# Patient Record
Sex: Male | Born: 1935 | Race: Black or African American | Hispanic: No | Smoking: Former smoker
Health system: Southern US, Community
[De-identification: ages and names within clinical notes are randomized; demographics above are authoritative.]

## PROBLEM LIST (undated history)

## (undated) DIAGNOSIS — C61 Malignant neoplasm of prostate: Secondary | ICD-10-CM

## (undated) DIAGNOSIS — J4540 Moderate persistent asthma, uncomplicated: Secondary | ICD-10-CM

## (undated) DIAGNOSIS — M109 Gout, unspecified: Secondary | ICD-10-CM

## (undated) DIAGNOSIS — N183 Chronic kidney disease, stage 3 unspecified: Secondary | ICD-10-CM

## (undated) DIAGNOSIS — Z955 Presence of coronary angioplasty implant and graft: Secondary | ICD-10-CM

## (undated) DIAGNOSIS — Z794 Long term (current) use of insulin: Secondary | ICD-10-CM

## (undated) DIAGNOSIS — R011 Cardiac murmur, unspecified: Secondary | ICD-10-CM

## (undated) DIAGNOSIS — I2510 Atherosclerotic heart disease of native coronary artery without angina pectoris: Secondary | ICD-10-CM

## (undated) DIAGNOSIS — I10 Essential (primary) hypertension: Secondary | ICD-10-CM

## (undated) DIAGNOSIS — G629 Polyneuropathy, unspecified: Secondary | ICD-10-CM

## (undated) DIAGNOSIS — I82409 Acute embolism and thrombosis of unspecified deep veins of unspecified lower extremity: Secondary | ICD-10-CM

## (undated) DIAGNOSIS — E119 Type 2 diabetes mellitus without complications: Secondary | ICD-10-CM

## (undated) DIAGNOSIS — G709 Myoneural disorder, unspecified: Secondary | ICD-10-CM

## (undated) DIAGNOSIS — G4730 Sleep apnea, unspecified: Secondary | ICD-10-CM

## (undated) DIAGNOSIS — IMO0002 Reserved for concepts with insufficient information to code with codable children: Secondary | ICD-10-CM

## (undated) DIAGNOSIS — K635 Polyp of colon: Secondary | ICD-10-CM

## (undated) DIAGNOSIS — R600 Localized edema: Secondary | ICD-10-CM

## (undated) DIAGNOSIS — H269 Unspecified cataract: Secondary | ICD-10-CM

## (undated) DIAGNOSIS — E1122 Type 2 diabetes mellitus with diabetic chronic kidney disease: Secondary | ICD-10-CM

## (undated) DIAGNOSIS — E785 Hyperlipidemia, unspecified: Secondary | ICD-10-CM

## (undated) DIAGNOSIS — D649 Anemia, unspecified: Secondary | ICD-10-CM

## (undated) DIAGNOSIS — E1165 Type 2 diabetes mellitus with hyperglycemia: Secondary | ICD-10-CM

## (undated) DIAGNOSIS — N400 Enlarged prostate without lower urinary tract symptoms: Secondary | ICD-10-CM

## (undated) DIAGNOSIS — G2581 Restless legs syndrome: Secondary | ICD-10-CM

## (undated) DIAGNOSIS — N189 Chronic kidney disease, unspecified: Secondary | ICD-10-CM

## (undated) DIAGNOSIS — J449 Chronic obstructive pulmonary disease, unspecified: Secondary | ICD-10-CM

## (undated) DIAGNOSIS — I739 Peripheral vascular disease, unspecified: Secondary | ICD-10-CM

## (undated) DIAGNOSIS — I219 Acute myocardial infarction, unspecified: Secondary | ICD-10-CM

## (undated) DIAGNOSIS — F524 Premature ejaculation: Secondary | ICD-10-CM

## (undated) DIAGNOSIS — H409 Unspecified glaucoma: Secondary | ICD-10-CM

## (undated) HISTORY — PX: APPENDECTOMY: SHX54

## (undated) HISTORY — PX: PROSTATE SURGERY: SHX751

## (undated) HISTORY — PX: EYE SURGERY: SHX253

## (undated) HISTORY — PX: TONSILLECTOMY: SUR1361

---

## 2007-10-21 HISTORY — PX: BELPHAROPTOSIS REPAIR: SHX369

## 2010-10-20 HISTORY — PX: COLONOSCOPY: SHX174

## 2015-04-12 DIAGNOSIS — Z9842 Cataract extraction status, left eye: Secondary | ICD-10-CM | POA: Insufficient documentation

## 2015-04-12 DIAGNOSIS — H409 Unspecified glaucoma: Secondary | ICD-10-CM | POA: Insufficient documentation

## 2015-06-14 DIAGNOSIS — J4540 Moderate persistent asthma, uncomplicated: Secondary | ICD-10-CM | POA: Insufficient documentation

## 2015-11-13 DIAGNOSIS — Z955 Presence of coronary angioplasty implant and graft: Secondary | ICD-10-CM

## 2015-11-13 HISTORY — DX: Presence of coronary angioplasty implant and graft: Z95.5

## 2016-06-16 DIAGNOSIS — B351 Tinea unguium: Secondary | ICD-10-CM | POA: Insufficient documentation

## 2018-10-03 DIAGNOSIS — N3000 Acute cystitis without hematuria: Secondary | ICD-10-CM | POA: Insufficient documentation

## 2018-10-03 DIAGNOSIS — Z789 Other specified health status: Secondary | ICD-10-CM | POA: Insufficient documentation

## 2018-10-03 DIAGNOSIS — R195 Other fecal abnormalities: Secondary | ICD-10-CM | POA: Insufficient documentation

## 2018-11-17 DIAGNOSIS — Y92009 Unspecified place in unspecified non-institutional (private) residence as the place of occurrence of the external cause: Secondary | ICD-10-CM | POA: Insufficient documentation

## 2018-12-03 ENCOUNTER — Emergency Department: Payer: Medicare Other

## 2018-12-03 ENCOUNTER — Other Ambulatory Visit: Payer: Self-pay

## 2018-12-03 DIAGNOSIS — K5900 Constipation, unspecified: Secondary | ICD-10-CM | POA: Diagnosis present

## 2018-12-03 DIAGNOSIS — E1122 Type 2 diabetes mellitus with diabetic chronic kidney disease: Secondary | ICD-10-CM | POA: Insufficient documentation

## 2018-12-03 DIAGNOSIS — Z87891 Personal history of nicotine dependence: Secondary | ICD-10-CM | POA: Insufficient documentation

## 2018-12-03 DIAGNOSIS — I2510 Atherosclerotic heart disease of native coronary artery without angina pectoris: Secondary | ICD-10-CM | POA: Diagnosis not present

## 2018-12-03 DIAGNOSIS — Z8546 Personal history of malignant neoplasm of prostate: Secondary | ICD-10-CM | POA: Insufficient documentation

## 2018-12-03 DIAGNOSIS — J449 Chronic obstructive pulmonary disease, unspecified: Secondary | ICD-10-CM | POA: Diagnosis not present

## 2018-12-03 DIAGNOSIS — J4540 Moderate persistent asthma, uncomplicated: Secondary | ICD-10-CM | POA: Diagnosis not present

## 2018-12-03 DIAGNOSIS — E119 Type 2 diabetes mellitus without complications: Secondary | ICD-10-CM | POA: Diagnosis not present

## 2018-12-03 DIAGNOSIS — N183 Chronic kidney disease, stage 3 (moderate): Secondary | ICD-10-CM | POA: Diagnosis not present

## 2018-12-03 NOTE — ED Triage Notes (Signed)
Pt states three days since a bowel movement. Pt states has been taking miralax "and I go a little". Pt complains of rectal pain. Pt denies nausea, vomiting, fever, abd pain.

## 2018-12-04 ENCOUNTER — Emergency Department
Admission: EM | Admit: 2018-12-04 | Discharge: 2018-12-04 | Disposition: A | Discharge status: Home / Self Care | Payer: Medicare Other | Attending: Emergency Medicine | Admitting: Emergency Medicine

## 2018-12-04 ENCOUNTER — Encounter: Payer: Self-pay | Admitting: Emergency Medicine

## 2018-12-04 DIAGNOSIS — K5900 Constipation, unspecified: Secondary | ICD-10-CM

## 2018-12-04 HISTORY — DX: Long term (current) use of insulin: Z79.4

## 2018-12-04 HISTORY — DX: Anemia, unspecified: D64.9

## 2018-12-04 HISTORY — DX: Moderate persistent asthma, uncomplicated: J45.40

## 2018-12-04 HISTORY — DX: Polyneuropathy, unspecified: G62.9

## 2018-12-04 HISTORY — DX: Unspecified glaucoma: H40.9

## 2018-12-04 HISTORY — DX: Myoneural disorder, unspecified: G70.9

## 2018-12-04 HISTORY — DX: Unspecified cataract: H26.9

## 2018-12-04 HISTORY — DX: Restless legs syndrome: G25.81

## 2018-12-04 HISTORY — DX: Hyperlipidemia, unspecified: E78.5

## 2018-12-04 HISTORY — DX: Type 2 diabetes mellitus with diabetic chronic kidney disease: E11.65

## 2018-12-04 HISTORY — DX: Chronic kidney disease, unspecified: N18.9

## 2018-12-04 HISTORY — DX: Cardiac murmur, unspecified: R01.1

## 2018-12-04 HISTORY — DX: Localized edema: R60.0

## 2018-12-04 HISTORY — DX: Gout, unspecified: M10.9

## 2018-12-04 HISTORY — DX: Premature ejaculation: F52.4

## 2018-12-04 HISTORY — DX: Benign prostatic hyperplasia without lower urinary tract symptoms: N40.0

## 2018-12-04 HISTORY — DX: Essential (primary) hypertension: I10

## 2018-12-04 HISTORY — DX: Malignant neoplasm of prostate: C61

## 2018-12-04 HISTORY — DX: Presence of coronary angioplasty implant and graft: Z95.5

## 2018-12-04 HISTORY — DX: Sleep apnea, unspecified: G47.30

## 2018-12-04 HISTORY — DX: Peripheral vascular disease, unspecified: I73.9

## 2018-12-04 HISTORY — DX: Chronic kidney disease, stage 3 unspecified: N18.30

## 2018-12-04 HISTORY — DX: Polyp of colon: K63.5

## 2018-12-04 HISTORY — DX: Acute myocardial infarction, unspecified: I21.9

## 2018-12-04 HISTORY — DX: Chronic obstructive pulmonary disease, unspecified: J44.9

## 2018-12-04 HISTORY — DX: Chronic kidney disease, stage 3 (moderate): N18.3

## 2018-12-04 HISTORY — DX: Type 2 diabetes mellitus without complications: E11.9

## 2018-12-04 HISTORY — DX: Acute embolism and thrombosis of unspecified deep veins of unspecified lower extremity: I82.409

## 2018-12-04 HISTORY — DX: Type 2 diabetes mellitus with diabetic chronic kidney disease: E11.22

## 2018-12-04 HISTORY — DX: Atherosclerotic heart disease of native coronary artery without angina pectoris: I25.10

## 2018-12-04 HISTORY — DX: Reserved for concepts with insufficient information to code with codable children: IMO0002

## 2018-12-04 MED ORDER — MAGNESIUM CITRATE PO SOLN
1.0000 | Freq: Once | ORAL | Status: AC
  Administered 2018-12-04: 1 via ORAL
  Filled 2018-12-04: qty 296

## 2018-12-04 MED ORDER — LIDOCAINE HCL URETHRAL/MUCOSAL 2 % EX GEL
1.0000 "application " | Freq: Once | CUTANEOUS | Status: AC
  Administered 2018-12-04: 1 via TOPICAL
  Filled 2018-12-04: qty 10

## 2018-12-04 MED ORDER — DOCUSATE SODIUM 50 MG/5ML PO LIQD
100.0000 mg | ORAL | Status: AC
  Administered 2018-12-04: 100 mg via ORAL
  Filled 2018-12-04: qty 10

## 2018-12-04 NOTE — ED Provider Notes (Signed)
[facility] Emergency Department Provider Note  ____________________________________________   First MD Initiated Contact with Patient 12/04/18 [phone]     (approximate)  I have reviewed the triage vital signs and the nursing notes.   HISTORY  Chief Complaint Constipation    HPI [name] is a 85 y.o. male who presents for evaluation of bloating and some aching lower abdominal pain associated with constipation.  He states he has not had a bowel movement for 3 days although when he takes MiraLAX he "goes a little".  He is he reports his symptoms are severe.  He states that they started after he started taking some antibiotics for a urinary tract infection and his daughter looked at the side effects on the Internet and constipation is no other side effects.  He has no numbness or weakness in his legs or his arms.  He has no back pain.  He denies fever/chills, chest pain, shortness of breath, nausea, and vomiting.  Nothing in particular is making the symptoms better or worse.  Past Medical History:  Diagnosis Date  . Anemia, normocytic normochromic   . BPH (benign prostatic hyperplasia)   . CAD (coronary artery disease)   . Cataracts, bilateral   . Chronic renal insufficiency   . Colon polyp   . COPD (chronic obstructive pulmonary disease) ([place])   . Diabetes mellitus without complication ([place])   . DVT (deep venous thrombosis) ([place])   . Edema of both legs   . Glaucoma   . Gout   . Heart murmur   . Hyperlipidemia   . Hypertension   . Moderate persistent asthma without complication   . Myocardial infarction ([place])   . Neuromuscular disorder ([place])   . Neuropathy   . Peripheral vascular disease ([place])   . Premature ejaculation   . Prostate cancer ([place])   . RLS (restless legs syndrome)   . Sleep apnea   . Stented coronary artery 11/13/2015  . Uncontrolled type 2 diabetes mellitus with stage 3 chronic kidney disease, with long-term current use of  insulin ([place])     There are no active problems to display for this patient.   Past Surgical History:  Procedure Laterality Date  . APPENDECTOMY    . [place]  2009  . COLONOSCOPY  2012  . EYE SURGERY    . PROSTATE SURGERY    . TONSILLECTOMY      Prior to Admission medications   Not on File    Allergies Patient has no known allergies.  History reviewed. No pertinent family history.  Social History Social History   Tobacco Use  . Smoking status: Former Research scientist (life sciences)  . Smokeless tobacco: Never Used  Substance Use Topics  . Alcohol use: Not on file  . Drug use: Not on file    Review of Systems Constitutional: No fever/chills Eyes: No visual changes. ENT: No sore throat. Cardiovascular: Denies chest pain. Respiratory: Denies shortness of breath. Gastrointestinal: Abdominal bloating and aching pain associated with constipation for 3 days as described above. Genitourinary: Negative for dysuria. Musculoskeletal: Negative for neck pain.  Negative for back pain. Integumentary: Negative for rash. Neurological: Negative for headaches, focal weakness or numbness.   ____________________________________________   PHYSICAL EXAM:  VITAL SIGNS: ED Triage Vitals  Enc Vitals Group     BP 12/03/18 2259 (!) 124/99     Pulse Rate 12/03/18 2259 90     Resp 12/03/18 2259 16     Temp 12/03/18 2259 98.9 F (37.2 C)  Temp src --      SpO2 12/03/18 2259 100 %     Weight 12/03/18 2300 123.4 kg (272 lb)     Height 12/03/18 2300 1.803 m (5\' 11" )     Head Circumference --      Peak Flow --      Pain Score 12/03/18 2300 9     Pain Loc --      Pain Edu? --      Excl. in [place]? --     Constitutional: Alert and oriented. Well appearing and in no acute distress. Eyes: Conjunctivae are normal.  Head: Atraumatic. Nose: No congestion/rhinnorhea. Mouth/Throat: Mucous membranes are moist. Neck: No stridor.  No meningeal signs.   Cardiovascular: Normal rate, regular rhythm.  Good peripheral circulation. Grossly normal heart sounds. Respiratory: Normal respiratory effort.  No retractions. Lungs CTAB. Gastrointestinal: Soft and nontender. No distention.  Rectal exam deferred at patient request. Musculoskeletal: No lower extremity tenderness nor edema. No gross deformities of extremities. Neurologic:  Normal speech and language. No gross focal neurologic deficits are appreciated.  Skin:  Skin is warm, dry and intact. No rash noted. Psychiatric: Mood and affect are normal. Speech and behavior are normal.  ____________________________________________   LABS (all labs ordered are listed, but only abnormal results are displayed)  Labs Reviewed - No data to display ____________________________________________  EKG  No indication for EKG ____________________________________________  RADIOLOGY I, Hinda Kehr, personally viewed and evaluated these images (plain radiographs) as part of my medical decision making, as well as reviewing the written report by the radiologist.  ED MD interpretation: Moderate stool burden without evidence of obstruction.  Official radiology report(s): Dg Abdomen Acute W/chest  Result Date: 12/03/2018 CLINICAL DATA:  No bowel movement for 3 days.  Constipation. EXAM: DG ABDOMEN ACUTE W/ 1V CHEST COMPARISON:  None. FINDINGS: There is no evidence of dilated bowel loops or free intraperitoneal air. Moderate bowel content is identified in the colon. No radiopaque calculi or other significant radiographic abnormality is seen. Heart size and mediastinal contours are within normal limits. Both lungs are clear. IMPRESSION: No bowel obstruction. Moderate bowel content identified in the colon. No acute cardiopulmonary disease. Electronically Signed   By: Abelardo Diesel M.D.   On: 12/03/2018 23:39    ____________________________________________   PROCEDURES  Critical Care performed: No   Procedure(s) performed:    Procedures   ____________________________________________   INITIAL IMPRESSION / ASSESSMENT AND PLAN / ED COURSE  As part of my medical decision making, I reviewed the following data within the [place] notes reviewed and incorporated, Old chart reviewed and Notes from prior ED visits    Differential diagnosis includes, but is not limited to, constipation for any 1 of a number of reasons, metabolic or electrolyte abnormality, acute neurological impingement.  Most likely patient is simply suffering from some slow transit constipation.  The MiraLAX seems to be working little bit but he most likely has a stool ball in the rectum that is prohibiting any additional progress.  I offered manual disimpaction but he prefers to avoid that if possible.  He is agreeable to a "pink elephant" enema (soapsuds, liquid docusate, and 150 mL of magnesium citrate mixed together as well as 150 mL of magnesium citrate by mouth).  We will then reassess.  Medication obstruction on acute abdomen series.  Clinical Course as of Dec 05 755  Sat Dec 04, 2018  0547 The patient's nurse disimpacted him manually after minimal success with enema.  He is now ambulatory, walked out in the hallway and asked "can I go now?".  He states he feels like a new man and is ready to go home.  I gave my usual customary constipation follow-up recommendations and return precautions.  No indication that he needs further work-up.   [CF]    Clinical Course User Index [CF] Hinda Kehr, MD    ____________________________________________  FINAL CLINICAL IMPRESSION(S) / ED DIAGNOSES  Final diagnoses:  Constipation, unspecified constipation type     MEDICATIONS GIVEN DURING THIS VISIT:  Medications  magnesium citrate solution 1 Bottle (1 Bottle Oral Given 12/04/18 0445)  docusate (COLACE) 50 MG/5ML liquid 100 mg (100 mg Oral Given 12/04/18 0445)  lidocaine (XYLOCAINE) 2 % jelly 1 application (1  application Topical Given 12/04/18 0531)     ED Discharge Orders    None       Note:  This document was prepared using Dragon voice recognition software and may include unintentional dictation errors.   Hinda Kehr, MD 12/04/18 [phone]

## 2018-12-04 NOTE — ED Notes (Signed)
Pt verbalized understanding of d/c instructions, medications, and follow up care. No further questions at this time. Pt ambulatory to the exit using personal cane with steady gait.

## 2018-12-04 NOTE — ED Notes (Signed)
Pt disimpacted by this RN, lidocaine jelly used at rectum for pt comfort. Tolerated well. Reports relief.

## 2018-12-04 NOTE — Discharge Instructions (Signed)

## 2018-12-04 NOTE — ED Notes (Signed)
Pt reports that he feels like stool is " halfway in and halfway out", reports no relief following enema. MD Karma Greaser aware.

## 2018-12-04 NOTE — ED Notes (Signed)
Pt up to the bathroom with RN assistance at this time.

## 2018-12-18 ENCOUNTER — Emergency Department
Admission: EM | Admit: 2018-12-18 | Discharge: 2018-12-18 | Disposition: A | Discharge status: Home / Self Care | Payer: Medicare Other | Attending: Emergency Medicine | Admitting: Emergency Medicine

## 2018-12-18 ENCOUNTER — Emergency Department: Payer: Medicare Other

## 2018-12-18 DIAGNOSIS — E162 Hypoglycemia, unspecified: Secondary | ICD-10-CM | POA: Diagnosis present

## 2018-12-18 DIAGNOSIS — Z8546 Personal history of malignant neoplasm of prostate: Secondary | ICD-10-CM | POA: Diagnosis not present

## 2018-12-18 DIAGNOSIS — Z87891 Personal history of nicotine dependence: Secondary | ICD-10-CM | POA: Insufficient documentation

## 2018-12-18 DIAGNOSIS — E11649 Type 2 diabetes mellitus with hypoglycemia without coma: Secondary | ICD-10-CM | POA: Insufficient documentation

## 2018-12-18 DIAGNOSIS — Z79899 Other long term (current) drug therapy: Secondary | ICD-10-CM | POA: Diagnosis not present

## 2018-12-18 DIAGNOSIS — J449 Chronic obstructive pulmonary disease, unspecified: Secondary | ICD-10-CM | POA: Insufficient documentation

## 2018-12-18 DIAGNOSIS — I252 Old myocardial infarction: Secondary | ICD-10-CM | POA: Insufficient documentation

## 2018-12-18 DIAGNOSIS — Z794 Long term (current) use of insulin: Secondary | ICD-10-CM | POA: Diagnosis not present

## 2018-12-18 DIAGNOSIS — I2510 Atherosclerotic heart disease of native coronary artery without angina pectoris: Secondary | ICD-10-CM | POA: Insufficient documentation

## 2018-12-18 LAB — URINALYSIS, COMPLETE (UACMP) WITH MICROSCOPIC
Bilirubin Urine: NEGATIVE
Glucose, UA: 150 mg/dL — AB
Ketones, ur: NEGATIVE mg/dL
Nitrite: NEGATIVE
Protein, ur: NEGATIVE mg/dL
SQUAMOUS EPITHELIAL / LPF: NONE SEEN (ref 0–5)
Specific Gravity, Urine: 1.016 (ref 1.005–1.030)
pH: 5 (ref 5.0–8.0)

## 2018-12-18 LAB — COMPREHENSIVE METABOLIC PANEL
ALT: 17 U/L (ref 0–44)
AST: 17 U/L (ref 15–41)
Albumin: 3.2 g/dL — ABNORMAL LOW (ref 3.5–5.0)
Alkaline Phosphatase: 77 U/L (ref 38–126)
Anion gap: 9 (ref 5–15)
BUN: 19 mg/dL (ref 8–23)
CO2: 22 mmol/L (ref 22–32)
Calcium: 8.3 mg/dL — ABNORMAL LOW (ref 8.9–10.3)
Chloride: 101 mmol/L (ref 98–111)
Creatinine, Ser: 1.21 mg/dL (ref 0.61–1.24)
GFR calc non Af Amer: 55 mL/min — ABNORMAL LOW (ref >60)
Glucose, Bld: 210 mg/dL — ABNORMAL HIGH (ref 70–99)
Potassium: 3.8 mmol/L (ref 3.5–5.1)
Sodium: 132 mmol/L — ABNORMAL LOW (ref 135–145)
Total Bilirubin: 0.5 mg/dL (ref 0.3–1.2)
Total Protein: 6.1 g/dL — ABNORMAL LOW (ref 6.5–8.1)

## 2018-12-18 LAB — CBC WITH DIFFERENTIAL/PLATELET
Abs Immature Granulocytes: 0.06 10*3/uL (ref 0.00–0.07)
Basophils Absolute: 0 10*3/uL (ref 0.0–0.1)
Basophils Relative: 0 %
EOS PCT: 0 %
Eosinophils Absolute: 0.1 10*3/uL (ref 0.0–0.5)
HCT: 35.9 % — ABNORMAL LOW (ref 39.0–52.0)
Hemoglobin: 11.2 g/dL — ABNORMAL LOW (ref 13.0–17.0)
Immature Granulocytes: 1 %
Lymphocytes Relative: 6 %
Lymphs Abs: 0.7 10*3/uL (ref 0.7–4.0)
MCH: 28.2 pg (ref 26.0–34.0)
MCHC: 31.2 g/dL (ref 30.0–36.0)
MCV: 90.4 fL (ref 80.0–100.0)
Monocytes Absolute: 0.5 10*3/uL (ref 0.1–1.0)
Monocytes Relative: 4 %
Neutro Abs: 10.4 10*3/uL — ABNORMAL HIGH (ref 1.7–7.7)
Neutrophils Relative %: 89 %
Platelets: 164 10*3/uL (ref 150–400)
RBC: 3.97 MIL/uL — ABNORMAL LOW (ref 4.22–5.81)
RDW: 13.2 % (ref 11.5–15.5)
WBC: 11.7 10*3/uL — ABNORMAL HIGH (ref 4.0–10.5)
nRBC: 0 % (ref 0.0–0.2)

## 2018-12-18 LAB — GLUCOSE, CAPILLARY
GLUCOSE-CAPILLARY: 168 mg/dL — AB (ref 70–99)
Glucose-Capillary: 50 mg/dL — ABNORMAL LOW (ref 70–99)
Glucose-Capillary: 134 mg/dL — ABNORMAL HIGH (ref 70–99)

## 2018-12-18 MED ORDER — DEXTROSE 50 % IV SOLN
1.0000 | Freq: Once | INTRAVENOUS | Status: AC
  Administered 2018-12-18: 50 mL via INTRAVENOUS

## 2018-12-18 MED ORDER — CEPHALEXIN 500 MG PO CAPS
500.0000 mg | ORAL_CAPSULE | Freq: Once | ORAL | Status: AC
  Administered 2018-12-18: 500 mg via ORAL
  Filled 2018-12-18: qty 1

## 2018-12-18 MED ORDER — DEXTROSE 50 % IV SOLN
INTRAVENOUS | Status: AC
  Filled 2018-12-18: qty 50

## 2018-12-18 MED ORDER — CEPHALEXIN 500 MG PO CAPS: 500.0000 mg | ORAL_CAPSULE | Freq: Three times a day (TID) | ORAL | 0 refills | Status: AC

## 2018-12-18 NOTE — ED Notes (Signed)
Pt off the floor in xray.

## 2018-12-18 NOTE — ED Provider Notes (Addendum)
[facility] Emergency Department Provider Note  ____________________________________________   I have reviewed the triage vital signs and the nursing notes. Where available I have reviewed prior notes and, if possible and indicated, outside hospital notes.    HISTORY  Chief Complaint Altered Mental Status and Hypoglycemia    HPI [name] is a 85 y.o. male who is brought in because of low sugar, he had had breakfast but no lunch, his sugar was low he got some sugar his sugar is better and he feels better.  He states sometimes he has urinary frequency.  He would like to have a urinalysis checked.  He has no chest pain or shortness of breath no cough no headache no stiff neck and no other complaints. He feels back to his baseline at this time.  Past Medical History:  Diagnosis Date  . Anemia, normocytic normochromic   . BPH (benign prostatic hyperplasia)   . CAD (coronary artery disease)   . Cataracts, bilateral   . Chronic renal insufficiency   . Colon polyp   . COPD (chronic obstructive pulmonary disease) ([place])   . Diabetes mellitus without complication ([place])   . DVT (deep venous thrombosis) ([place])   . Edema of both legs   . Glaucoma   . Gout   . Heart murmur   . Hyperlipidemia   . Hypertension   . Moderate persistent asthma without complication   . Myocardial infarction ([place])   . Neuromuscular disorder ([place])   . Neuropathy   . Peripheral vascular disease ([place])   . Premature ejaculation   . Prostate cancer ([place])   . RLS (restless legs syndrome)   . Sleep apnea   . Stented coronary artery 11/13/2015  . Uncontrolled type 2 diabetes mellitus with stage 3 chronic kidney disease, with long-term current use of insulin ([place])     There are no active problems to display for this patient.   Past Surgical History:  Procedure Laterality Date  . APPENDECTOMY    . [place]  2009  . COLONOSCOPY  2012  . EYE SURGERY    . PROSTATE  SURGERY    . TONSILLECTOMY      Prior to Admission medications   Medication Sig Start Date End Date Taking? Authorizing Provider  amLODipine (NORVASC) 10 MG tablet Take 10 mg by mouth daily. 12/03/16  Yes [provider]  atorvastatin (LIPITOR) 80 MG tablet Take 80 mg by mouth daily. 11/15/15  Yes [provider]  carvedilol (COREG) 25 MG tablet Take 25 mg by mouth 2 (two) times daily with a meal.   Yes [provider]  Cholecalciferol (VITAMIN D) 50 MCG (2000 UT) tablet Take 1,000 Units by mouth daily.   Yes [provider]  clopidogrel (PLAVIX) 75 MG tablet Take 75 mg by mouth daily. 11/12/16  Yes [provider]  DULoxetine (CYMBALTA) 60 MG capsule Take 60 mg by mouth daily.   Yes [provider]  finasteride (PROSCAR) 5 MG tablet Take 5 mg by mouth daily. 05/22/14  Yes [provider]  furosemide (LASIX) 20 MG tablet Take 10 mg by mouth as needed.   Yes [provider]  gabapentin (NEURONTIN) 300 MG capsule Take 300 mg by mouth. Five times daily 10/18/18  Yes [provider]  insulin aspart (NOVOLOG) 100 UNIT/ML injection Inject 50 Units into the skin 3 (three) times daily before meals.   Yes [provider]  insulin detemir (LEVEMIR) 100 UNIT/ML injection Inject 80 Units into  the skin at bedtime.   Yes [provider]  Multiple Vitamins-Minerals (MULTIVITAMIN WITH MINERALS) tablet Take 1 tablet by mouth daily.   Yes [provider]  nitroGLYCERIN (NITROSTAT) 0.4 MG SL tablet Place 0.4 mg under the tongue as needed.   Yes [provider]    Allergies Metformin and related  History reviewed. No pertinent family history.  Social History Social History   Tobacco Use  . Smoking status: Former Research scientist (life sciences)  . Smokeless tobacco: Never Used  Substance Use Topics  . Alcohol use: Not on file  . Drug use: Not on file    Review of Systems Constitutional: No fever/chills Eyes: No  visual changes. ENT: No sore throat. No stiff neck no neck pain Cardiovascular: Denies chest pain. Respiratory: Denies shortness of breath. Gastrointestinal:   no vomiting.  No diarrhea.  No constipation. Genitourinary: Negative for dysuria. Musculoskeletal: Negative lower extremity swelling Skin: Negative for rash. Neurological: Negative for severe headaches, focal weakness or numbness.   ____________________________________________   PHYSICAL EXAM:  VITAL SIGNS: ED Triage Vitals  Enc Vitals Group     BP 12/18/18 1614 131/70     Pulse Rate 12/18/18 1614 (!) 57     Resp 12/18/18 1614 14     Temp 12/18/18 1614 (!) 97.3 F (36.3 C)     Temp Source 12/18/18 1614 Oral     SpO2 12/18/18 1614 95 %     Weight 12/18/18 1615 272 lb (123.4 kg)     Height --      Head Circumference --      Peak Flow --      Pain Score 12/18/18 1615 0     Pain Loc --      Pain Edu? --      Excl. in [place]? --     Constitutional: Alert and oriented. Well appearing and in no acute distress. Eyes: Conjunctivae are normal Head: Atraumatic HEENT: No congestion/rhinnorhea. Mucous membranes are moist.  Oropharynx non-erythematous Neck:   Nontender with no meningismus, no masses, no stridor Cardiovascular: Normal rate, regular rhythm. Grossly normal heart sounds.  Good peripheral circulation. Respiratory: Normal respiratory effort.  No retractions. Lungs CTAB. Abdominal: Soft and nontender. No distention. No guarding no rebound Back:  There is no focal tenderness or step off.  there is no midline tenderness there are no lesions noted. there is no CVA tenderness Musculoskeletal: No lower extremity tenderness, no upper extremity tenderness. No joint effusions, no DVT signs strong distal pulses no edema Neurologic:  Normal speech and language. No gross focal neurologic deficits are appreciated.  Skin:  Skin is warm, dry and intact. No rash noted. Psychiatric: Mood and affect are normal. Speech and behavior are  normal.  ____________________________________________   LABS (all labs ordered are listed, but only abnormal results are displayed)  Labs Reviewed  GLUCOSE, CAPILLARY - Abnormal; Notable for the following components:      Result Value   Glucose-Capillary 50 (*)    All other components within normal limits  GLUCOSE, CAPILLARY - Abnormal; Notable for the following components:   Glucose-Capillary 134 (*)    All other components within normal limits  CBC WITH DIFFERENTIAL/PLATELET - Abnormal; Notable for the following components:   WBC 11.7 (*)    RBC 3.97 (*)    Hemoglobin 11.2 (*)    HCT 35.9 (*)    Neutro Abs 10.4 (*)    All other components within normal limits  COMPREHENSIVE METABOLIC PANEL - Abnormal; Notable for the following  components:   Sodium 132 (*)    Glucose, Bld 210 (*)    Calcium 8.3 (*)    Total Protein 6.1 (*)    Albumin 3.2 (*)    GFR calc non Af Amer 55 (*)    All other components within normal limits  URINALYSIS, COMPLETE (UACMP) WITH MICROSCOPIC    Pertinent labs  results that were available during my care of the patient were reviewed by me and considered in my medical decision making (see chart for details). ____________________________________________  EKG  I personally interpreted any EKGs ordered by me or triage Sinus rhythm rate 64 bpm no acute ST elevation depression normal axis unremarkable EKG, Q waves noted inferiorly, no old for comparison ____________________________________________  RADIOLOGY  Pertinent labs & imaging results that were available during my care of the patient were reviewed by me and considered in my medical decision making (see chart for details). If possible, patient and/or family made aware of any abnormal findings.  Dg Chest 2 View  Result Date: 12/18/2018 CLINICAL DATA:  c/o altered LOC per family. Possible UTI symptoms per EMS. D10 given by EMS. CBG 50 upon arrival Hx of COPD, CAD, hypertension EXAM: CHEST - 2 VIEW  COMPARISON:  12/03/2018 FINDINGS: Heart is enlarged. Shallow lung inflation. There is prominence of interstitial markings, new since the prior study. Focal opacity at the LEFT lung base is consistent atelectasis or early infiltrate. There is gaseous distension of the stomach. IMPRESSION: 1. Shallow inflation. 2. Cardiomegaly and interstitial edema. 3. Atelectasis or early infiltrate at the LEFT lung base. Electronically Signed   By: Nolon Nations M.D.   On: 12/18/2018 18:56   ____________________________________________    PROCEDURES  Procedure(s) performed: None  Procedures  Critical Care performed: None  ____________________________________________   INITIAL IMPRESSION / ASSESSMENT AND PLAN / ED COURSE  Pertinent labs & imaging results that were available during my care of the patient were reviewed by me and considered in my medical decision making (see chart for details).  Patient had a low blood sugar, it was corrected, he has been eating he looks fine he has no complaints except for the fact that he would like [place] to check and see if he has a UA.  We have been waiting for him to give [place] urine sample, apparently he went over the x-ray and decided he had to urinate at that moment, urinated in an emesis bag and we could not use that.  Patient is angry and upset about this I have done my best to explain to him and the family that if he wants me to check urine we have to have urine in a suitable collection receptacle, which is at his bedside, he is expressing disgruntlement  with this situation.  he does not want a catheterization.  At this point, he is quite well-appearing.  Chest x-ray was somewhat equivocal but there  is no clinical evidence to support pneumonia, and he is not complaining of cough.   When the waveform is good on the pulse ox he has a sat of 100% on room air, his lungs are clear and his family state he is at his baseline.  Patient is somewhat difficult to mollify. I have  done my best to explain that this  is a very difficult and busy ER at this time with multiple critical patients going to the ICU and we are doing our best to catch his urine at the moment that he feels he can give it to  [place].  ----------------------------------------- 8:11 PM on 12/18/2018 -----------------------------------------  Patient and I had a long conversation, he is much more at ease at this time with the process now that he understands it better, he does have a urinary tract infection, I commended him for asking [place] to check it, we will start him on antibiotics urine culture has been sent no indication for admission he is in no acute distress and he is ready for discharge which we will obtain.  I have no evidence of urinary sepsis, and we will start him on antibiotics here.     ____________________________________________   FINAL CLINICAL IMPRESSION(S) / ED DIAGNOSES  Final diagnoses:  None      This chart was dictated using voice recognition software.  Despite best efforts to proofread,  errors can occur which can change meaning.      Schuyler Amor, MD 12/18/18 1933    Schuyler Amor, MD 12/18/18 2001    Schuyler Amor, MD 12/18/18 2011
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
## 2018-12-18 NOTE — Discharge Instructions (Signed)
If you use insulin you must be sure to eat and not miss meals, watch your sugar very closely stay with family, you do have a urinary tract infection we have sent a urine culture, if you get sicker in any way including fever vomiting or lethargy or other concerns return to the emergency room.  Otherwise call your doctor for an appointment first thing on Monday.  It was a pleasure to see you today.

## 2018-12-18 NOTE — ED Notes (Signed)
RN in room to see if pt able to provide urine sample. Pt unable to provide sample at this time.

## 2018-12-18 NOTE — ED Notes (Signed)
ED Provider at bedside to discuss disposition with the patient and family

## 2018-12-21 LAB — URINE CULTURE: Culture: >=100000 — AB

## 2018-12-31 ENCOUNTER — Encounter: Payer: Self-pay | Admitting: Emergency Medicine

## 2018-12-31 ENCOUNTER — Emergency Department
Admission: EM | Admit: 2018-12-31 | Discharge: 2018-12-31 | Disposition: A | Discharge status: Home / Self Care | Payer: Medicare Other | Attending: Emergency Medicine | Admitting: Emergency Medicine

## 2018-12-31 ENCOUNTER — Other Ambulatory Visit: Payer: Self-pay

## 2018-12-31 DIAGNOSIS — E11649 Type 2 diabetes mellitus with hypoglycemia without coma: Secondary | ICD-10-CM | POA: Insufficient documentation

## 2018-12-31 DIAGNOSIS — T383X5A Adverse effect of insulin and oral hypoglycemic [antidiabetic] drugs, initial encounter: Secondary | ICD-10-CM

## 2018-12-31 DIAGNOSIS — E160 Drug-induced hypoglycemia without coma: Secondary | ICD-10-CM

## 2018-12-31 DIAGNOSIS — E785 Hyperlipidemia, unspecified: Secondary | ICD-10-CM | POA: Insufficient documentation

## 2018-12-31 DIAGNOSIS — I10 Essential (primary) hypertension: Secondary | ICD-10-CM | POA: Diagnosis not present

## 2018-12-31 DIAGNOSIS — Z79899 Other long term (current) drug therapy: Secondary | ICD-10-CM | POA: Insufficient documentation

## 2018-12-31 DIAGNOSIS — I2510 Atherosclerotic heart disease of native coronary artery without angina pectoris: Secondary | ICD-10-CM | POA: Insufficient documentation

## 2018-12-31 DIAGNOSIS — J449 Chronic obstructive pulmonary disease, unspecified: Secondary | ICD-10-CM | POA: Diagnosis not present

## 2018-12-31 LAB — GLUCOSE, CAPILLARY
Glucose-Capillary: 81 mg/dL (ref 70–99)
Glucose-Capillary: 80 mg/dL (ref 70–99)
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
## 2018-12-31 NOTE — ED Notes (Signed)
Pt eating at this time.
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
## 2018-12-31 NOTE — ED Notes (Signed)
Pt given graham crackers and peanut butter with diet cola

## 2019-01-18 DIAGNOSIS — Z87440 Personal history of urinary (tract) infections: Secondary | ICD-10-CM | POA: Insufficient documentation

## 2019-01-18 DIAGNOSIS — N320 Bladder-neck obstruction: Secondary | ICD-10-CM | POA: Insufficient documentation

## 2019-01-31 DIAGNOSIS — R600 Localized edema: Secondary | ICD-10-CM | POA: Insufficient documentation

## 2019-01-31 DIAGNOSIS — E782 Mixed hyperlipidemia: Secondary | ICD-10-CM | POA: Insufficient documentation

## 2019-07-04 DIAGNOSIS — L03116 Cellulitis of left lower limb: Secondary | ICD-10-CM | POA: Insufficient documentation

## 2019-08-03 ENCOUNTER — Ambulatory Visit: Payer: Medicare Other | Admitting: Physical Therapy

## 2019-08-04 ENCOUNTER — Other Ambulatory Visit: Payer: Self-pay

## 2019-08-04 ENCOUNTER — Encounter: Payer: Medicare Other | Attending: Physician Assistant | Admitting: Physician Assistant

## 2019-08-04 DIAGNOSIS — E1140 Type 2 diabetes mellitus with diabetic neuropathy, unspecified: Secondary | ICD-10-CM | POA: Insufficient documentation

## 2019-08-04 DIAGNOSIS — R600 Localized edema: Secondary | ICD-10-CM | POA: Diagnosis not present

## 2019-08-04 DIAGNOSIS — I872 Venous insufficiency (chronic) (peripheral): Secondary | ICD-10-CM | POA: Insufficient documentation

## 2019-08-04 DIAGNOSIS — L97812 Non-pressure chronic ulcer of other part of right lower leg with fat layer exposed: Secondary | ICD-10-CM | POA: Insufficient documentation

## 2019-08-04 DIAGNOSIS — B354 Tinea corporis: Secondary | ICD-10-CM | POA: Diagnosis not present

## 2019-08-04 DIAGNOSIS — Z6841 Body Mass Index (BMI) 40.0 and over, adult: Secondary | ICD-10-CM | POA: Insufficient documentation

## 2019-08-04 DIAGNOSIS — N186 End stage renal disease: Secondary | ICD-10-CM | POA: Insufficient documentation

## 2019-08-04 DIAGNOSIS — I890 Lymphedema, not elsewhere classified: Secondary | ICD-10-CM | POA: Diagnosis not present

## 2019-08-04 DIAGNOSIS — E669 Obesity, unspecified: Secondary | ICD-10-CM | POA: Diagnosis not present

## 2019-08-04 DIAGNOSIS — E1122 Type 2 diabetes mellitus with diabetic chronic kidney disease: Secondary | ICD-10-CM | POA: Insufficient documentation

## 2019-08-04 DIAGNOSIS — E785 Hyperlipidemia, unspecified: Secondary | ICD-10-CM | POA: Diagnosis not present

## 2019-08-04 DIAGNOSIS — I2510 Atherosclerotic heart disease of native coronary artery without angina pectoris: Secondary | ICD-10-CM | POA: Diagnosis not present

## 2019-08-04 DIAGNOSIS — I252 Old myocardial infarction: Secondary | ICD-10-CM | POA: Diagnosis not present

## 2019-08-04 DIAGNOSIS — Z881 Allergy status to other antibiotic agents status: Secondary | ICD-10-CM | POA: Insufficient documentation

## 2019-08-04 DIAGNOSIS — M1990 Unspecified osteoarthritis, unspecified site: Secondary | ICD-10-CM | POA: Insufficient documentation

## 2019-08-04 DIAGNOSIS — Z87891 Personal history of nicotine dependence: Secondary | ICD-10-CM | POA: Diagnosis not present

## 2019-08-04 DIAGNOSIS — M109 Gout, unspecified: Secondary | ICD-10-CM | POA: Diagnosis not present

## 2019-08-04 DIAGNOSIS — Z86718 Personal history of other venous thrombosis and embolism: Secondary | ICD-10-CM | POA: Insufficient documentation

## 2019-08-04 DIAGNOSIS — Z992 Dependence on renal dialysis: Secondary | ICD-10-CM | POA: Insufficient documentation

## 2019-08-04 DIAGNOSIS — I120 Hypertensive chronic kidney disease with stage 5 chronic kidney disease or end stage renal disease: Secondary | ICD-10-CM | POA: Diagnosis not present

## 2019-08-04 DIAGNOSIS — I878 Other specified disorders of veins: Secondary | ICD-10-CM | POA: Insufficient documentation

## 2019-08-04 DIAGNOSIS — J449 Chronic obstructive pulmonary disease, unspecified: Secondary | ICD-10-CM | POA: Diagnosis not present

## 2019-08-04 NOTE — Progress Notes (Signed)
[name], [name] ([id number]) Visit Report for 08/04/2019 Abuse/Suicide Risk Screen Details Patient Name: [name], [name] Date of Service: 08/04/2019 1:15 PM Medical Record Number: [id number] Patient Account Number: [id number] Date of Birth/Sex: 1932/04/14 (85 y.o. M) Treating RN: Montey Hora Primary Care Aarsh Fristoe: Philis Fendt Other Clinician: Referring Yolandra Habig: Philis Fendt Treating Ifeanyi Mickelson/Extender: Melburn Hake, HOYT Weeks in Treatment: 0 Abuse/Suicide Risk Screen Items Answer ABUSE RISK SCREEN: Has anyone close to you tried to hurt or harm you recentlyo No Do you feel uncomfortable with anyone in your familyo No Has anyone forced you do things that you didnot want to doo No Electronic Signature(s) Signed: 08/04/2019 5:17:59 PM By: Montey Hora Entered By: Montey Hora on 08/04/2019 13:28:01 [name] ([id number]) -------------------------------------------------------------------------------- Activities of Daily Living Details Patient Name: [name] Date of Service: 08/04/2019 1:15 PM Medical Record Number: [id number] Patient Account Number: [id number] Date of Birth/Sex: 1932/05/13 (85 y.o. M) Treating RN: Montey Hora Primary Care Maxtyn Nuzum: Philis Fendt Other Clinician: Referring Aretha Levi: Philis Fendt Treating Rainen Vanrossum/Extender: Melburn Hake, HOYT Weeks in Treatment: 0 Activities of Daily Living Items Answer Activities of Daily Living (Please select one for each item) Drive Automobile Completely Able Take Medications Completely Able Use Telephone Completely Able Care for Appearance Completely Able Use Toilet Completely Able Bath / Shower Completely Able Dress Self Completely Able Feed Self Completely Able Walk Completely Able Get In / Out Bed Completely Able Housework Need Assistance Prepare Meals Need Assistance Handle Money Completely Able Shop for Self Need Assistance Electronic Signature(s) Signed: 08/04/2019 5:17:59 PM By:  Montey Hora Entered By: Montey Hora on 08/04/2019 13:28:26 [name] ([id number]) -------------------------------------------------------------------------------- Education Screening Details Patient Name: [name] Date of Service: 08/04/2019 1:15 PM Medical Record Number: [id number] Patient Account Number: [id number] Date of Birth/Sex: 1932-01-02 (85 y.o. M) Treating RN: Montey Hora Primary Care Joline Encalada: Philis Fendt Other Clinician: Referring Garry Bochicchio: Philis Fendt Treating Sami Roes/Extender: Melburn Hake, HOYT Weeks in Treatment: 0 Primary Learner Assessed: Patient Learning Preferences/Education Level/Primary Language Learning Preference: Explanation, Demonstration Highest Education Level: High School Preferred Language: English Cognitive Barrier Language Barrier: No Translator Needed: No Memory Deficit: No Emotional Barrier: No Cultural/Religious Beliefs Affecting Medical Care: No Physical Barrier Impaired Vision: No Impaired Hearing: No Decreased Hand dexterity: No Knowledge/Comprehension Knowledge Level: Medium Comprehension Level: Medium Ability to understand written Medium instructions: Ability to understand verbal Medium instructions: Motivation Anxiety Level: Calm Cooperation: Cooperative Education Importance: Acknowledges Need Interest in Health Problems: Asks Questions Perception: Coherent Willingness to Engage in Self- Medium Management Activities: Readiness to Engage in Self- Medium Management Activities: Electronic Signature(s) Signed: 08/04/2019 5:17:59 PM By: Montey Hora Entered By: Montey Hora on 08/04/2019 13:29:44 SHEPARD, KELTZ ([id number]) -------------------------------------------------------------------------------- Fall Risk Assessment Details Patient Name: [name] Date of Service: 08/04/2019 1:15 PM Medical Record Number: [id number] Patient Account Number: [id number] Date of Birth/Sex:  29-Apr-1932 (85 y.o. M) Treating RN: Montey Hora Primary Care Graham Hyun: Philis Fendt Other Clinician: Referring Tryton Bodi: Philis Fendt Treating Pete Schnitzer/Extender: Melburn Hake, HOYT Weeks in Treatment: 0 Fall Risk Assessment Items Have you had 2 or more falls in the last 12 monthso 0 No Have you had any fall that resulted in injury in the last 12 monthso 0 No FALLS RISK SCREEN History of falling - immediate or within 3 months 0 No Secondary diagnosis (Do you have 2 or more medical diagnoseso) 0 No Ambulatory aid None/bed rest/wheelchair/nurse 0 No Crutches/cane/walker 15 Yes Furniture 0 No Intravenous therapy Access/Saline/Heparin Lock 0 No Gait/Transferring Normal/ bed rest/ wheelchair 0 No Weak (short steps with or without shuffle, stooped but  able to lift head while 10 Yes walking, may seek support from furniture) Impaired (short steps with shuffle, may have difficulty arising from chair, head 0 No down, impaired balance) Mental Status Oriented to own ability 0 Yes Electronic Signature(s) Signed: 08/04/2019 5:17:59 PM By: Montey Hora Entered By: Montey Hora on 08/04/2019 13:30:00 [name] ([id number]) -------------------------------------------------------------------------------- Foot Assessment Details Patient Name: [name] Date of Service: 08/04/2019 1:15 PM Medical Record Number: [id number] Patient Account Number: [id number] Date of Birth/Sex: 1932-04-04 (85 y.o. M) Treating RN: Montey Hora Primary Care Aldred Mase: Philis Fendt Other Clinician: Referring Kaylla Cobos: Philis Fendt Treating Kaithlyn Teagle/Extender: Melburn Hake, HOYT Weeks in Treatment: 0 Foot Assessment Items Site Locations + = Sensation present, - = Sensation absent, C = Callus, U = Ulcer R = Redness, W = Warmth, M = Maceration, PU = Pre-ulcerative lesion F = Fissure, S = Swelling, D = Dryness Assessment Right: Left: Other Deformity: No No Prior Foot Ulcer: No No Prior  Amputation: No No Charcot Joint: No No Ambulatory Status: Ambulatory With Help Assistance Device: Cane Gait: Steady Electronic Signature(s) Signed: 08/04/2019 5:17:59 PM By: Montey Hora Entered By: Montey Hora on 08/04/2019 13:39:43 Tullo, Jodi Mourning ([id number]) -------------------------------------------------------------------------------- Nutrition Risk Screening Details Patient Name: [name] Date of Service: 08/04/2019 1:15 PM Medical Record Number: [id number] Patient Account Number: [id number] Date of Birth/Sex: [birth year] (85 y.o. M) Treating RN: Montey Hora Primary Care Luvada Salamone: Philis Fendt Other Clinician: Referring Sheldon Sem: Philis Fendt Treating Kolby Myung/Extender: Melburn Hake, HOYT Weeks in Treatment: 0 Height (in): 71 Weight (lbs): 289 Body Mass Index (BMI): 40.3 Nutrition Risk Screening Items Score Screening NUTRITION RISK SCREEN: I have an illness or condition that made me change the kind and/or amount of 0 No food I eat I eat fewer than two meals per day 0 No I eat few fruits and vegetables, or milk products 0 No I have three or more drinks of beer, liquor or wine almost every day 0 No I have tooth or mouth problems that make it hard for me to eat 0 No I don't always have enough money to buy the food I need 0 No I eat alone most of the time 0 No I take three or more different prescribed or over-the-counter drugs a day 1 Yes Without wanting to, I have lost or gained 10 pounds in the last six months 0 No I am not always physically able to shop, cook and/or feed myself 0 No Nutrition Protocols Good Risk Protocol 0 No interventions needed Moderate Risk Protocol High Risk Proctocol Risk Level: Good Risk Score: 1 Electronic Signature(s) Signed: 08/04/2019 5:17:59 PM By: Montey Hora Entered By: Montey Hora on 08/04/2019 13:30:07

## 2019-08-05 NOTE — Progress Notes (Signed)
[name], [name] ([id number]) Visit Report for 08/04/2019 Chief Complaint Document Details Patient Name: [name] Date of Service: 08/04/2019 1:15 PM Medical Record Number: [id number] Patient Account Number: [id number] Date of Birth/Sex: 02-Sep-1932 (85 y.o. M) Treating RN: Cornell Barman Primary Care Provider: Philis Fendt Other Clinician: Referring Provider: Philis Fendt Treating Provider/Extender: Melburn Hake, HOYT Weeks in Treatment: 0 Information Obtained from: Patient Chief Complaint Bilateral LE Lymphedema with right leg ulcer Electronic Signature(s) Signed: 08/04/2019 2:04:10 PM By: Worthy Keeler PA-C Previous Signature: 08/04/2019 1:48:14 PM Version By: Worthy Keeler PA-C Entered By: Worthy Keeler on 08/04/2019 14:04:10 [name], [name] ([id number]) -------------------------------------------------------------------------------- HPI Details Patient Name: [name] Date of Service: 08/04/2019 1:15 PM Medical Record Number: [id number] Patient Account Number: [id number] Date of Birth/Sex: Mar 13, 1932 (85 y.o. M) Treating RN: Cornell Barman Primary Care Provider: Philis Fendt Other Clinician: Referring Provider: Philis Fendt Treating Provider/Extender: Melburn Hake, HOYT Weeks in Treatment: 0 History of Present Illness HPI Description: 08/04/2019 on evaluation today patient presents for initial evaluation here in our clinic concerning issues that he has been having with lymphedema for quite some time. He has intermittent issues with blistering of his lower extremities for which she will go to the wound center and typically they will wrap him and then subsequently this gets better. That is been an ongoing issue for quite some time. Currently a couple weeks ago he had blisters pop up on the left leg and the right leg currently there is a small spot open on the right but nothing on the left. He does have a history of coronary artery disease, COPD, diabetes mellitus  type 2, and hypertension. Fortunately there is no signs of any cellulitis or active infection at this time. He has never had any lymphedema pumps and in fact did not even know what I was talking about when I mention this to him although he has been wrapped and had compression recommended for him long enough he may qualify for going ahead and initiating therapy with lymphedema pumps obviously this would have to be insurance approved. Fortunately there is no signs of any systemic infection either. No fevers, chills, nausea, vomiting, or diarrhea. He does have stage III lymphedema. Electronic Signature(s) Signed: 08/04/2019 2:04:40 PM By: Worthy Keeler PA-C Entered By: Worthy Keeler on 08/04/2019 14:04:40 [name], [name] ([id number]) -------------------------------------------------------------------------------- Physical Exam Details Patient Name: [name] Date of Service: 08/04/2019 1:15 PM Medical Record Number: [id number] Patient Account Number: [id number] Date of Birth/Sex: 31-Jul-1932 (85 y.o. M) Treating RN: Cornell Barman Primary Care Provider: Philis Fendt Other Clinician: Referring Provider: Philis Fendt Treating Provider/Extender: Melburn Hake, HOYT Weeks in Treatment: 0 Constitutional patient is hypertensive.. pulse regular and within target range for patient.Marland Kitchen respirations regular, non-labored and within target range for patient.Marland Kitchen temperature within target range for patient.. Well-nourished and well-hydrated in no acute distress. Eyes conjunctiva clear no eyelid edema noted. pupils equal round and reactive to light and accommodation. Ears, Nose, Mouth, and Throat no gross abnormality of ear auricles or external auditory canals. normal hearing noted during conversation. mucus membranes moist. Respiratory normal breathing without difficulty. clear to auscultation bilaterally. Cardiovascular regular rate and rhythm with normal S1, S2. 2+ dorsalis pedis/posterior tibialis  pulses. no clubbing, cyanosis, significant edema, <3 sec cap refill. Gastrointestinal (GI) soft, non-tender, non-distended, +BS. no ventral hernia noted. Musculoskeletal normal gait and posture. no significant deformity or arthritic changes, no loss or range of motion, no clubbing. Psychiatric this patient is able to make decisions and demonstrates good insight into disease  process. Alert and Oriented x 3. pleasant and cooperative. Notes Upon evaluation today patient appears to be doing quite well with regard to his lower extremities for the most part he has had Unna boot wraps it sounds like for the past couple weeks with his primary care provider. With that being said I do believe that he is going to need more consistent and possibly even stronger compression for the time being to help get some of this fluid out of his legs as he has a tremendous amount of edema in the bilateral lower extremities. I do believe that treatment with a 4 layer compression wrap would be appropriate. Patient is in agreement with that plan. His daughter likewise is in agreement as well. With that being said I do believe that outside of the compression wraps the lymphedema pumps would also be of benefit for him. We will also likely need to get him orders for new compression stockings as he is not exactly sure where the ones are that he typically used as he just moved in with his daughter and lost them in transition. Electronic Signature(s) Signed: 08/04/2019 2:05:48 PM By: Worthy Keeler PA-C Entered By: Worthy Keeler on 08/04/2019 14:05:48 [name], [name] ([id number]) -------------------------------------------------------------------------------- Physician Orders Details Patient Name: [name] Date of Service: 08/04/2019 1:15 PM Medical Record Number: [id number] Patient Account Number: [id number] Date of Birth/Sex: 1932-09-11 (85 y.o. M) Treating RN: Cornell Barman Primary Care Provider: Philis Fendt Other Clinician: Referring Provider: Philis Fendt Treating Provider/Extender: Melburn Hake, HOYT Weeks in Treatment: 0 Verbal / Phone Orders: No Diagnosis Coding ICD-10 Coding Code Description I89.0 Lymphedema, not elsewhere classified I25.10 Atherosclerotic heart disease of native coronary artery without angina pectoris J44.9 Chronic obstructive pulmonary disease, unspecified E11.628 Type 2 diabetes mellitus with other skin complications O16 Essential (primary) hypertension Wound Cleansing Wound #1 Right,Posterior Lower Leg o May shower with protection. Anesthetic (add to Medication List) Wound #1 Right,Posterior Lower Leg o Topical Lidocaine 4% cream applied to wound bed prior to debridement (In Clinic Only). Skin Barriers/Peri-Wound Care o Antifungal cream - Discoloration on right leg below knee. Primary Wound Dressing Wound #1 Right,Posterior Lower Leg o Alginate Secondary Dressing Wound #1 Right,Posterior Lower Leg o XtraSorb Dressing Change Frequency Wound #1 Right,Posterior Lower Leg o Change dressing every week o Other: - nurse visit if needed Follow-up Appointments Wound #1 Right,Posterior Lower Leg o Return Appointment in 1 week. o Nurse Visit as needed Edema Control Wound #1 Right,Posterior Lower Leg o 4 Layer Compression System - Bilateral o Elevate legs to the level of the heart and pump ankles as often as possible [name], [name] ([id number]) Home Health Wound #1 [place] [facility] - [place] [phone]) o Home Health Nurse may visit PRN to address patientos wound care needs. o FACE TO FACE ENCOUNTER: MEDICARE and MEDICAID PATIENTS: I certify that this patient is under my care and that I had a face-to-face encounter that meets the physician face-to-face encounter requirements with this patient on this date. The encounter with the patient was in whole or in part for the  following MEDICAL CONDITION: (primary reason for [place]) MEDICAL NECESSITY: I certify, that based on my findings, NURSING services are a medically necessary home health service. HOME BOUND STATUS: I certify that my clinical findings support that this patient is homebound (i.e., Due to illness or injury, pt requires aid of supportive devices such as crutches, cane, wheelchairs, walkers, the use of special transportation or the  assistance of another person to leave their place of residence. There is a normal inability to leave the home and doing so requires considerable and taxing effort. Other absences are for medical reasons / religious services and are infrequent or of short duration when for other reasons). o If current dressing causes regression in wound condition, may D/C ordered dressing product/s and apply Normal Saline Moist Dressing daily until next [place] / Other MD appointment. [place] of regression in wound condition at [phone]. o Please direct any NON-WOUND related issues/requests for orders to patient's Primary Care Physician Patient Medications Allergies: metformin Notifications Medication Indication Start End nystatin 08/04/2019 DOSE topical 100,000 unit/gram cream - cream topical applied to the rash on the bilateral LE as directed in the clinic daily and under the wrap with each dressing change Electronic Signature(s) Signed: 08/04/2019 2:07:10 PM By: Worthy Keeler PA-C Entered By: Worthy Keeler on 08/04/2019 14:07:09 [name] ([id number]) -------------------------------------------------------------------------------- Problem List Details Patient Name: [name] Date of Service: 08/04/2019 1:15 PM Medical Record Number: [id number] Patient Account Number: [id number] Date of Birth/Sex: 1931-10-31 (85 y.o. M) Treating RN: Cornell Barman Primary Care Provider: Philis Fendt Other Clinician: Referring  Provider: Philis Fendt Treating Provider/Extender: Melburn Hake, HOYT Weeks in Treatment: 0 Active Problems ICD-10 Evaluated Encounter Code Description Active Date Today Diagnosis I89.0 Lymphedema, not elsewhere classified 08/04/2019 No Yes L97.812 Non-pressure chronic ulcer of other part of right lower leg 08/04/2019 No Yes with fat layer exposed B35.4 Tinea corporis 08/04/2019 No Yes I25.10 Atherosclerotic heart disease of native coronary artery 08/04/2019 No Yes without angina pectoris J44.9 Chronic obstructive pulmonary disease, unspecified 08/04/2019 No Yes E11.628 Type 2 diabetes mellitus with other skin complications 92/08/9416 No Yes I10 Essential (primary) hypertension 08/04/2019 No Yes Inactive Problems Resolved Problems Electronic Signature(s) Signed: 08/04/2019 2:07:36 PM By: Worthy Keeler PA-C Previous Signature: 08/04/2019 2:03:56 PM Version By: Worthy Keeler PA-C Previous Signature: 08/04/2019 1:47:45 PM Version By: Worthy Keeler PA-C Entered By: Worthy Keeler on 08/04/2019 14:07:35 [name], [name] ([id number]) [name], [name] ([id number]) -------------------------------------------------------------------------------- Progress Note Details Patient Name: [name] Date of Service: 08/04/2019 1:15 PM Medical Record Number: [id number] Patient Account Number: [id number] Date of Birth/Sex: 1932-03-27 (85 y.o. M) Treating RN: Cornell Barman Primary Care Provider: Philis Fendt Other Clinician: Referring Provider: Philis Fendt Treating Provider/Extender: Melburn Hake, HOYT Weeks in Treatment: 0 Subjective Chief Complaint Information obtained from Patient Bilateral LE Lymphedema with right leg ulcer History of Present Illness (HPI) 08/04/2019 on evaluation today patient presents for initial evaluation here in our clinic concerning issues that he has been having with lymphedema for quite some time. He has intermittent issues with blistering of his lower  extremities for which she will go to the wound center and typically they will wrap him and then subsequently this gets better. That is been an ongoing issue for quite some time. Currently a couple weeks ago he had blisters pop up on the left leg and the right leg currently there is a small spot open on the right but nothing on the left. He does have a history of coronary artery disease, COPD, diabetes mellitus type 2, and hypertension. Fortunately there is no signs of any cellulitis or active infection at this time. He has never had any lymphedema pumps and in fact did not even know what I was talking about when I mention this to him although he has been wrapped and had compression recommended for him long enough he may qualify for  going ahead and initiating therapy with lymphedema pumps obviously this would have to be insurance approved. Fortunately there is no signs of any systemic infection either. No fevers, chills, nausea, vomiting, or diarrhea. He does have stage III lymphedema. Patient History Allergies metformin Family History Diabetes - Child, No family history of Cancer, Heart Disease, Hereditary Spherocytosis, Hypertension, Kidney Disease, Lung Disease, Seizures, Stroke, Thyroid Problems, Tuberculosis. Social History Former smoker - 2000, Marital Status - Widowed, Alcohol Use - Never - quit 2000, Drug Use - No History, Caffeine Use - Daily. Medical History Respiratory Patient has history of Chronic Obstructive Pulmonary Disease (COPD) Denies history of Aspiration, Asthma, Pneumothorax, Sleep Apnea, Tuberculosis Cardiovascular Patient has history of Coronary Artery Disease, Deep Vein Thrombosis, Hypertension, Myocardial Infarction Denies history of Angina, Arrhythmia, Congestive Heart Failure, Hypotension, Peripheral Arterial Disease, Peripheral Venous Disease, Phlebitis, Vasculitis Endocrine Patient has history of Type II Diabetes Denies history of Type I  Diabetes Genitourinary Patient has history of End Stage Renal Disease - CKD Integumentary (Skin) Denies history of History of Burn, History of pressure wounds [name], [name] ([id number]) Musculoskeletal Patient has history of Gout, Osteoarthritis Denies history of Rheumatoid Arthritis, Osteomyelitis Neurologic Patient has history of Neuropathy Denies history of Dementia, Quadriplegia, Paraplegia, Seizure Disorder Patient is treated with Insulin. Blood sugar is tested. Medical And Surgical History Notes Cardiovascular HLD, venous stasis Integumentary (Skin) history of venous ulcers Review of Systems (ROS) Constitutional Symptoms (General Health) Denies complaints or symptoms of Fatigue, Fever, Chills, Marked Weight Change. Eyes Denies complaints or symptoms of Dry Eyes, Vision Changes, Glasses / Contacts. Ear/Nose/Mouth/Throat Denies complaints or symptoms of Difficult clearing ears, Sinusitis. Hematologic/Lymphatic Denies complaints or symptoms of Bleeding / Clotting Disorders, Human Immunodeficiency Virus. Respiratory Denies complaints or symptoms of Chronic or frequent coughs, Shortness of Breath. Cardiovascular Complains or has symptoms of LE edema. Denies complaints or symptoms of Chest pain. Gastrointestinal Denies complaints or symptoms of Frequent diarrhea, Nausea, Vomiting. Endocrine Denies complaints or symptoms of Hepatitis, Thyroid disease, Polydypsia (Excessive Thirst). Genitourinary Complains or has symptoms of Kidney failure/ Dialysis - CKD. Denies complaints or symptoms of Incontinence/dribbling. Immunological Denies complaints or symptoms of Hives, Itching. Integumentary (Skin) Complains or has symptoms of Swelling. Denies complaints or symptoms of Wounds, Bleeding or bruising tendency, Breakdown. Musculoskeletal Denies complaints or symptoms of Muscle Pain, Muscle Weakness. Neurologic Denies complaints or symptoms of Numbness/parasthesias,  Focal/Weakness. Psychiatric Denies complaints or symptoms of Anxiety, Claustrophobia. Objective Constitutional [name], [name] ([id number]) patient is hypertensive.. pulse regular and within target range for patient.Marland Kitchen respirations regular, non-labored and within target range for patient.Marland Kitchen temperature within target range for patient.. Well-nourished and well-hydrated in no acute distress. Vitals Time Taken: 1:20 PM, Height: 71 in, Source: Stated, Weight: 289 lbs, Source: Measured, BMI: 40.3, Temperature: 98.2 F, Pulse: 67 bpm, Respiratory Rate: 16 breaths/min, Blood Pressure: 144/88 mmHg. Eyes conjunctiva clear no eyelid edema noted. pupils equal round and reactive to light and accommodation. Ears, Nose, Mouth, and Throat no gross abnormality of ear auricles or external auditory canals. normal hearing noted during conversation. mucus membranes moist. Respiratory normal breathing without difficulty. clear to auscultation bilaterally. Cardiovascular regular rate and rhythm with normal S1, S2. 2+ dorsalis pedis/posterior tibialis pulses. no clubbing, cyanosis, significant edema, Gastrointestinal (GI) soft, non-tender, non-distended, +BS. no ventral hernia noted. Musculoskeletal normal gait and posture. no significant deformity or arthritic changes, no loss or range of motion, no clubbing. Psychiatric this patient is able to make decisions and demonstrates good insight into disease process. Alert and Oriented x 3. pleasant and  cooperative. General Notes: Upon evaluation today patient appears to be doing quite well with regard to his lower extremities for the most part he has had Unna boot wraps it sounds like for the past couple weeks with his primary care provider. With that being said I do believe that he is going to need more consistent and possibly even stronger compression for the time being to help get some of this fluid out of his legs as he has a tremendous amount of edema in the  bilateral lower extremities. I do believe that treatment with a 4 layer compression wrap would be appropriate. Patient is in agreement with that plan. His daughter likewise is in agreement as well. With that being said I do believe that outside of the compression wraps the lymphedema pumps would also be of benefit for him. We will also likely need to get him orders for new compression stockings as he is not exactly sure where the ones are that he typically used as he just moved in with his daughter and lost them in transition. Integumentary (Hair, Skin) Wound #1 status is Open. Original cause of wound was Gradually Appeared. The wound is located on the Right,Posterior Lower Leg. The wound measures 1.5cm length x 1.2cm width x 0.2cm depth; 1.414cm^2 area and 0.283cm^3 volume. There is Fat Layer (Subcutaneous Tissue) Exposed exposed. There is no tunneling or undermining noted. There is a large amount of serous drainage noted. The wound margin is flat and intact. There is small (1-33%) granulation within the wound bed. There is a large (67-100%) amount of necrotic tissue within the wound bed including Adherent Slough. Other Condition(s) Patient presents with Lymphedema located on the Bilateral Leg. Assessment Active Problems ICD-10 Lymphedema, not elsewhere classified [name], [name] ([id number]) Non-pressure chronic ulcer of other part of right lower leg with fat layer exposed Tinea corporis Atherosclerotic heart disease of native coronary artery without angina pectoris Chronic obstructive pulmonary disease, unspecified Type 2 diabetes mellitus with other skin complications Essential (primary) hypertension Procedures Wound #1 Pre-procedure diagnosis of Wound #1 is a Lymphedema located on the Right,Posterior Lower Leg . There was a Four Layer Compression Therapy Procedure with a pre-treatment ABI of 1.1 by Cornell Barman, RN. Post procedure Diagnosis Wound #1: Same as Pre-Procedure Plan Wound  Cleansing: Wound #1 Right,Posterior Lower Leg: May shower with protection. Anesthetic (add to Medication List): Wound #1 Right,Posterior Lower Leg: Topical Lidocaine 4% cream applied to wound bed prior to debridement (In Clinic Only). Skin Barriers/Peri-Wound Care: Antifungal cream - Discoloration on right leg below knee. Primary Wound Dressing: Wound #1 Right,Posterior Lower Leg: Alginate Secondary Dressing: Wound #1 Right,Posterior Lower Leg: XtraSorb Dressing Change Frequency: Wound #1 Right,Posterior Lower Leg: Change dressing every week Other: - nurse visit if needed Follow-up Appointments: Wound #1 Right,Posterior Lower Leg: Return Appointment in 1 week. Nurse Visit as needed Edema Control: Wound #1 Right,Posterior Lower Leg: 4 Layer Compression System - Bilateral Elevate legs to the level of the heart and pump ankles as often as possible Home Health: Wound #1 Right,Posterior Lower Leg: [place] [facility] - [place] [phone]) Home Health Nurse may visit PRN to address patient s wound care needs. FACE TO FACE ENCOUNTER: MEDICARE and MEDICAID PATIENTS: I certify that this patient is under my care and that I had a face-to-face encounter that meets the physician face-to-face encounter requirements with this patient on this date. The [name], [name] ([id number]) encounter with the patient was in whole or in part for the following MEDICAL  CONDITION: (primary reason for Home Healthcare) MEDICAL NECESSITY: I certify, that based on my findings, NURSING services are a medically necessary home health service. HOME BOUND STATUS: I certify that my clinical findings support that this patient is homebound (i.e., Due to illness or injury, pt requires aid of supportive devices such as crutches, cane, wheelchairs, walkers, the use of special transportation or the assistance of another person to leave their place of residence. There is a normal inability to leave  the home and doing so requires considerable and taxing effort. Other absences are for medical reasons / religious services and are infrequent or of short duration when for other reasons). If current dressing causes regression in wound condition, may D/C ordered dressing product/s and apply Normal Saline Moist Dressing daily until next [place] / Other MD appointment. [place] of regression in wound condition at [phone]. Please direct any NON-WOUND related issues/requests for orders to patient's Primary Care Physician The following medication(s) was prescribed: nystatin topical 100,000 unit/gram cream cream topical applied to the rash on the bilateral LE as directed in the clinic daily and under the wrap with each dressing change starting 08/04/2019 1. My suggestion at this time is going to be that we go ahead and initiate treatment with a silver alginate dressing for the right lower extremity ulcer along with a 4 layer compression wrap bilaterally. 2. With regard to the tinea corporis on the bilateral lower extremities right greater than left I will initiate treatment with nystatin cream to both areas to see how this does. 3. With regard to the elevation I think he does need to elevate his legs as much as possible to keep the edema under control this is definitely something this can be ultimately very important for him. 4. He does have stage III lymphedema and has been under the care of the wound care center in [place] for some time where he had compression wraps as well as his primary care provider ways had Unna boot wraps. This has been more than 28 days in length. I think that at this time we can definitely see about looking into lymphedema pumps I just need to gather all those records for insurance purposes and I will work on that later today. We will see patient back for reevaluation in 1 week here in the clinic. If anything worsens or changes patient  will contact our office for additional recommendations. Electronic Signature(s) Signed: 08/04/2019 2:09:08 PM By: Worthy Keeler PA-C Entered By: Worthy Keeler on 08/04/2019 14:09:07 [name], [name] ([id number]) -------------------------------------------------------------------------------- ROS/PFSH Details Patient Name: [name] Date of Service: 08/04/2019 1:15 PM Medical Record Number: [id number] Patient Account Number: [id number] Date of Birth/Sex: [birth year] (85 y.o. M) Treating RN: Montey Hora Primary Care Provider: Philis Fendt Other Clinician: Referring Provider: Philis Fendt Treating Provider/Extender: Melburn Hake, HOYT Weeks in Treatment: 0 Constitutional Symptoms (General Health) Complaints and Symptoms: Negative for: Fatigue; Fever; Chills; Marked Weight Change Eyes Complaints and Symptoms: Negative for: Dry Eyes; Vision Changes; Glasses / Contacts Ear/Nose/Mouth/Throat Complaints and Symptoms: Negative for: Difficult clearing ears; Sinusitis Hematologic/Lymphatic Complaints and Symptoms: Negative for: Bleeding / Clotting Disorders; Human Immunodeficiency Virus Respiratory Complaints and Symptoms: Negative for: Chronic or frequent coughs; Shortness of Breath Medical History: Positive for: Chronic Obstructive Pulmonary Disease (COPD) Negative for: Aspiration; Asthma; Pneumothorax; Sleep Apnea; Tuberculosis Cardiovascular Complaints and Symptoms: Positive for: LE edema Negative for: Chest pain Medical History: Positive for: Coronary Artery Disease; Deep Vein Thrombosis; Hypertension; Myocardial Infarction Negative for: Angina; Arrhythmia;  Congestive Heart Failure; Hypotension; Peripheral Arterial Disease; Peripheral Venous Disease; Phlebitis; Vasculitis Past Medical History Notes: HLD, venous stasis Gastrointestinal Complaints and Symptoms: Negative for: Frequent diarrhea; Nausea; Vomiting Endocrine Complaints and Symptoms: Negative for:  Hepatitis; Thyroid disease; Polydypsia (Excessive Thirst) [name], Sherrick ([id number]) Medical History: Positive for: Type II Diabetes Negative for: Type I Diabetes Time with diabetes: 5 years Treated with: Insulin Blood sugar tested every day: Yes Tested : TID Genitourinary Complaints and Symptoms: Positive for: Kidney failure/ Dialysis - CKD Negative for: Incontinence/dribbling Medical History: Positive for: End Stage Renal Disease - CKD Immunological Complaints and Symptoms: Negative for: Hives; Itching Integumentary (Skin) Complaints and Symptoms: Positive for: Swelling Negative for: Wounds; Bleeding or bruising tendency; Breakdown Medical History: Negative for: History of Burn; History of pressure wounds Past Medical History Notes: history of venous ulcers Musculoskeletal Complaints and Symptoms: Negative for: Muscle Pain; Muscle Weakness Medical History: Positive for: Gout; Osteoarthritis Negative for: Rheumatoid Arthritis; Osteomyelitis Neurologic Complaints and Symptoms: Negative for: Numbness/parasthesias; Focal/Weakness Medical History: Positive for: Neuropathy Negative for: Dementia; Quadriplegia; Paraplegia; Seizure Disorder Psychiatric Complaints and Symptoms: Negative for: Anxiety; Claustrophobia Oncologic MIKLOS, BIDINGER ([id number]) Immunizations Pneumococcal Vaccine: Received Pneumococcal Vaccination: Yes Immunization Notes: up to date Implantable Devices None Family and Social History Cancer: No; Diabetes: Yes - Child; Heart Disease: No; Hereditary Spherocytosis: No; Hypertension: No; Kidney Disease: No; Lung Disease: No; Seizures: No; Stroke: No; Thyroid Problems: No; Tuberculosis: No; Former smoker - 2000; Marital Status - Widowed; Alcohol Use: Never - quit 2000; Drug Use: No History; Caffeine Use: Daily; Financial Concerns: No; Food, Clothing or Shelter Needs: No; Support System Lacking: No; Transportation Concerns: No Electronic  Signature(s) Signed: 08/04/2019 5:17:59 PM By: Montey Hora Signed: 08/04/2019 6:39:08 PM By: Worthy Keeler PA-C Entered By: Montey Hora on 08/04/2019 13:37:06 Devargas, Jodi Mourning ([id number]) -------------------------------------------------------------------------------- SuperBill Details Patient Name: [name] Date of Service: 08/04/2019 Medical Record Number: [id number] Patient Account Number: [id number] Date of Birth/Sex: Dec 31, 1931 (85 y.o. M) Treating RN: Cornell Barman Primary Care Provider: Philis Fendt Other Clinician: Referring Provider: Philis Fendt Treating Provider/Extender: Melburn Hake, HOYT Weeks in Treatment: 0 Diagnosis Coding ICD-10 Codes Code Description I89.0 Lymphedema, not elsewhere classified I25.10 Atherosclerotic heart disease of native coronary artery without angina pectoris J44.9 Chronic obstructive pulmonary disease, unspecified E11.628 Type 2 diabetes mellitus with other skin complications S01 Essential (primary) hypertension Facility Procedures CPT4: Description Modifier Quantity Code [id number] - WOUND CARE VISIT-LEV 3 EST PT 1 CPT4: 32202542 70623 BILATERAL: Application of multi-layer venous compression system; leg (below 1 knee), including ankle and foot. Physician Procedures CPT4 Code Description: 7628315 [id number] - WC PHYS LEVEL 4 - NEW PT ICD-10 Diagnosis Description I89.0 Lymphedema, not elsewhere classified I25.10 Atherosclerotic heart disease of native coronary artery witho J44.9 Chronic obstructive pulmonary disease,  unspecified E11.628 Type 2 diabetes mellitus with other skin complications Modifier: ut angina pectori Quantity: 1 s Electronic Signature(s) Signed: 08/04/2019 2:09:23 PM By: Worthy Keeler PA-C Entered By: Worthy Keeler on 08/04/2019 14:09:23

## 2019-08-05 NOTE — Progress Notes (Signed)
LISTON, THUM ([id number]) Visit Report for 08/04/2019 Allergy List Details Patient Name: [name], [name] Date of Service: 08/04/2019 1:15 PM Medical Record Number: [id number] Patient Account Number: [id number] Date of Birth/Sex: 22-Jan-1932 (85 y.o. M) Treating RN: [name] Primary Care [name]: [name] Other Clinician: Referring [name]: [name] Treating [name]/Extender: [name] Weeks in Treatment: 0 Allergies Active Allergies metformin Allergy Notes Electronic Signature(s) Signed: 08/04/2019 5:17:59 PM By: [name] Entered By: [name] on 08/04/2019 13:27:50 [name], [name] ([id number]) -------------------------------------------------------------------------------- Arrival Information Details Patient Name: [name] Date of Service: 08/04/2019 1:15 PM Medical Record Number: [id number] Patient Account Number: [id number] Date of Birth/Sex: 1932-08-21 (85 y.o. M) Treating RN: [name] Primary Care [name]: [name] Other Clinician: Referring [name]: [name] Treating [name]/Extender: [name] Weeks in Treatment: 0 Visit Information Patient Arrived: Cane Arrival Time: 13:18 Accompanied By: daughter, [name] Transfer Assistance: None Patient Identification Verified: Yes Secondary Verification Process Yes Completed: Electronic Signature(s) Signed: 08/04/2019 2:37:26 PM By: [name] RCP, RRT, CHT Entered By: [name] on 08/04/2019 13:19:06 [name], [name] ([id number]) -------------------------------------------------------------------------------- Clinic Level of Care Assessment Details Patient Name: [name], [name] Date of Service: 08/04/2019 1:15 PM Medical Record Number: [id number] Patient Account Number: [id number] Date of Birth/Sex: [birth year] (85 y.o. M) Treating RN: [name] Primary Care Trena Dunavan: [name] Other Clinician: Referring  [name]: [name] Treating [name]/Extender: [name] Weeks in Treatment: 0 Clinic Level of Care Assessment Items TOOL 1 Quantity Score []  - Use when EandM and Procedure is performed on INITIAL visit 0 ASSESSMENTS - Nursing Assessment / Reassessment X - General Physical Exam (combine w/ comprehensive assessment (listed just below) when 1 20 performed on new pt. evals) X- 1 25 Comprehensive Assessment (HX, ROS, Risk Assessments, Wounds Hx, etc.) ASSESSMENTS - Wound and Skin Assessment / Reassessment []  - Dermatologic / Skin Assessment (not related to wound area) 0 ASSESSMENTS - Ostomy and/or Continence Assessment and Care []  - Incontinence Assessment and Management 0 []  - 0 Ostomy Care Assessment and Management (repouching, etc.) PROCESS - Coordination of Care X - Simple Patient / Family Education for ongoing care 1 15 []  - 0 Complex (extensive) Patient / Family Education for ongoing care X- 1 10 Staff obtains Programmer, systems, Records, Test Results / Process Orders []  - 0 Staff telephones HHA, Nursing Homes / Clarify orders / etc []  - 0 Routine Transfer to another Facility (non-emergent condition) []  - 0 Routine Hospital Admission (non-emergent condition) X- 1 15 New Admissions / Biomedical engineer / Ordering NPWT, Apligraf, etc. []  - 0 Emergency Hospital Admission (emergent condition) PROCESS - Special Needs []  - Pediatric / Minor Patient Management 0 []  - 0 Isolation Patient Management []  - 0 Hearing / Language / Visual special needs []  - 0 Assessment of Community assistance (transportation, D/C planning, etc.) []  - 0 Additional assistance / Altered mentation []  - 0 Support Surface(s) Assessment (bed, cushion, seat, etc.) [name], [name] ([id number]) INTERVENTIONS - Miscellaneous []  - External ear exam 0 []  - 0 Patient Transfer (multiple staff / Civil Service fast streamer / Similar devices) []  - 0 Simple Staple / Suture removal (25 or less) []  - 0 Complex Staple /  Suture removal (26 or more) []  - 0 Hypo/Hyperglycemic Management (do not check if billed separately) X- 1 15 Ankle / Brachial Index (ABI) - do not check if billed separately Has the patient been seen at the hospital within the last three years: Yes Total Score: 100 Level Of Care: New/Established - Level 3 Electronic Signature(s) Signed: 08/04/2019  5:55:45 PM By: [name], BSN, RN, CWS, Kim RN, BSN Entered By: [name], BSN, RN, CWS, [name] on 08/04/2019 14:03:11 [name] ([id number]) -------------------------------------------------------------------------------- Compression Therapy Details Patient Name: [name] Date of Service: 08/04/2019 1:15 PM Medical Record Number: [id number] Patient Account Number: [id number] Date of Birth/Sex: 1932-04-29 (85 y.o. M) Treating RN: [name] Primary Care Jearline Hirschhorn: [name] Other Clinician: Referring Season Astacio: [name] Treating Alexxa Sabet/Extender: [name] Weeks in Treatment: 0 Compression Therapy Performed for Wound Assessment: Wound #1 Right,Posterior Lower Leg Performed By: Clinician Brandon Barman, RN Compression Type: Four Layer Pre Treatment ABI: 1.1 Post Procedure Diagnosis Same as Pre-procedure Electronic Signature(s) Signed: 08/04/2019 5:55:45 PM By: [name], BSN, RN, CWS, Kim RN, BSN Entered By: [name], BSN, RN, CWS, [name] on 08/04/2019 13:56:59 [name] ([id number]) -------------------------------------------------------------------------------- Encounter Discharge Information Details Patient Name: [name] Date of Service: 08/04/2019 1:15 PM Medical Record Number: [id number] Patient Account Number: [id number] Date of Birth/Sex: December 10, 1931 (85 y.o. M) Treating RN: [name] Primary Care Rylee Nuzum: [name] Other Clinician: Referring Sena Clouatre: [name] Treating Damaris Geers/Extender: [name] Weeks in Treatment: 0 Encounter Discharge Information Items Discharge Condition:  Stable Ambulatory Status: Ambulatory Discharge Destination: Home Transportation: Private Auto Accompanied By: self Schedule Follow-up Appointment: Yes Clinical Summary of Care: Electronic Signature(s) Signed: 08/04/2019 5:55:45 PM By: [name], BSN, RN, CWS, Kim RN, BSN Entered By: [name], BSN, RN, CWS, [name] on 08/04/2019 14:05:24 [name] ([id number]) -------------------------------------------------------------------------------- Lower Extremity Assessment Details Patient Name: [name] Date of Service: 08/04/2019 1:15 PM Medical Record Number: [id number] Patient Account Number: [id number] Date of Birth/Sex: [birth year] (85 y.o. M) Treating RN: [name] Primary Care Daniesha Driver: [name] Other Clinician: Referring Mazella Deen: [name] Treating Bobbi Kozakiewicz/Extender: [name] Weeks in Treatment: 0 Edema Assessment Assessed: [Left: No] [Right: No] Edema: [Left: Yes] [Right: Yes] Calf Left: Right: Point of Measurement: 37 cm From Medial Instep 48 cm 47 cm Ankle Left: Right: Point of Measurement: 10 cm From Medial Instep 30.5 cm 33 cm Vascular Assessment Pulses: Dorsalis Pedis Palpable: [Left:Yes] [Right:Yes] Doppler Audible: [Left:Yes] [Right:Yes] Posterior Tibial Palpable: [Left:Yes] [Right:Yes] Doppler Audible: [Left:Yes] [Right:Yes] Blood Pressure: Brachial: [Left:144] Dorsalis Pedis: 160 [Left:Dorsalis Pedis: 152] Ankle: Posterior Tibial: 160 [Left:Posterior Tibial: 160 1.11] [Right:1.11] Electronic Signature(s) Signed: 08/04/2019 5:17:59 PM By: [name] Entered By: [name] on 08/04/2019 13:39:17 [name], [name] ([id number]) -------------------------------------------------------------------------------- Multi Wound Chart Details Patient Name: [name] Date of Service: 08/04/2019 1:15 PM Medical Record Number: [id number] Patient Account Number: [id number] Date of Birth/Sex: 05-26-1932 (85 y.o. M) Treating RN: [name] Primary Care Indy Prestwood: [name] Other Clinician: Referring Kevaughn Ewing: [name] Treating Keeshia Sanderlin/Extender: [name] Weeks in Treatment: 0 Vital Signs Height(in): 71 Pulse(bpm): 92 Weight(lbs): 289 Blood Pressure(mmHg): 144/88 Body Mass Index(BMI): 40 Temperature(F): 98.2 Respiratory Rate 16 (breaths/min): Photos: [N/A:N/A] Wound Location: Right Lower Leg - Posterior N/A N/A Wounding Event: Gradually Appeared N/A N/A Primary Etiology: Lymphedema N/A N/A Comorbid History: Chronic Obstructive N/A N/A Pulmonary Disease (COPD), Coronary Artery Disease, Deep Vein Thrombosis, Hypertension, Myocardial Infarction, Type II Diabetes, End Stage Renal Disease, Gout, Osteoarthritis, Neuropathy Date Acquired: 07/25/2019 N/A N/A Weeks of Treatment: 0 N/A N/A Wound Status: Open N/A N/A Measurements L x W x D 1.5x1.2x0.2 N/A N/A (cm) Area (cm) : 1.414 N/A N/A Volume (cm) : 0.283 N/A N/A % Reduction in Area: 0.00% N/A N/A % Reduction in Volume: 0.00% N/A N/A Classification: Full Thickness Without N/A N/A Exposed Support Structures Exudate Amount: Large N/A N/A Exudate Type: Serous N/A N/A Exudate Color: amber N/A N/A  Wound Margin: Flat and Intact N/A N/A Granulation Amount: Small (1-33%) N/A N/A Necrotic Amount: Large (67-100%) N/A N/A [name], [name] ([id number]) Exposed Structures: Fat Layer (Subcutaneous N/A N/A Tissue) Exposed: Yes Fascia: No Tendon: No Muscle: No Joint: No Bone: No Treatment Notes Electronic Signature(s) Signed: 08/04/2019 5:55:45 PM By: [name], BSN, RN, CWS, Kim RN, BSN Entered By: [name], BSN, RN, CWS, [name] on 08/04/2019 13:56:37 [name], [name] ([id number]) -------------------------------------------------------------------------------- Multi-Disciplinary Care Plan Details Patient Name: [name] Date of Service: 08/04/2019 1:15 PM Medical Record Number: [id number] Patient Account Number: [id number] Date of Birth/Sex:  1931/12/15 (85 y.o. M) Treating RN: [name] Primary Care Renelda Kilian: [name] Other Clinician: Referring Aarya Robinson: [name] Treating Aynsley Fleet/Extender: [name] Weeks in Treatment: 0 Active Inactive Necrotic Tissue Nursing Diagnoses: Impaired tissue integrity related to necrotic/devitalized tissue Goals: Necrotic/devitalized tissue will be minimized in the wound bed Date Initiated: 08/04/2019 Target Resolution Date: 08/01/2019 Goal Status: Active Interventions: Assess patient pain level pre-, during and post procedure and prior to discharge Treatment Activities: Apply topical anesthetic as ordered : 08/04/2019 Notes: Orientation to the Wound Care Program Nursing Diagnoses: Knowledge deficit related to the wound healing center program Goals: Patient/caregiver will verbalize understanding of the [place] Date Initiated: 08/04/2019 Target Resolution Date: 08/09/2019 Goal Status: Active Interventions: Provide education on orientation to the wound center Notes: Venous Leg Ulcer Nursing Diagnoses: Actual venous Insuffiency (use after diagnosis is confirmed) Goals: Patient will maintain optimal edema control Date Initiated: 08/04/2019 Target Resolution Date: 08/24/2019 Goal Status: Active [name], [name] ([id number]) Interventions: Assess peripheral edema status every visit. Treatment Activities: Therapeutic compression applied : 08/04/2019 Notes: Electronic Signature(s) Signed: 08/04/2019 5:55:45 PM By: [name], BSN, RN, CWS, Kim RN, BSN Entered By: [name], BSN, RN, CWS, [name] on 08/04/2019 13:56:24 [name] ([id number]) -------------------------------------------------------------------------------- Non-Wound Condition Assessment Details Patient Name: [name] Date of Service: 08/04/2019 1:15 PM Medical Record Number: [id number] Patient Account Number: [id number] Date of Birth/Sex: 03/02/1932 (85 y.o. M) Treating RN:  [name] Primary Care Dafney Farler: [name] Other Clinician: Referring Ketzia Guzek: [name] Treating Romano Stigger/Extender: [name] Weeks in Treatment: 0 Non-Wound Condition: Condition: Lymphedema Location: Leg Side: Bilateral Photos Electronic Signature(s) Signed: 08/04/2019 5:17:59 PM By: [name] Entered By: [name] on 08/04/2019 13:42:21 [name], [name] ([id number]) -------------------------------------------------------------------------------- Pain Assessment Details Patient Name: [name] Date of Service: 08/04/2019 1:15 PM Medical Record Number: [id number] Patient Account Number: [id number] Date of Birth/Sex: 01/28/1932 (85 y.o. M) Treating RN: [name] Primary Care Bentlie Catanzaro: [name] Other Clinician: Referring Americo Vallery: [name] Treating Danetra Glock/Extender: [name] Weeks in Treatment: 0 Active Problems Location of Pain Severity and Description of Pain Patient Has Paino No Site Locations Pain Management and Medication Current Pain Management: Electronic Signature(s) Signed: 08/04/2019 2:37:26 PM By: [name] RCP, RRT, CHT Signed: 08/04/2019 5:55:45 PM By: [name], BSN, RN, CWS, Kim RN, BSN Entered By: [name] on 08/04/2019 13:19:28 [name], [name] ([id number]) -------------------------------------------------------------------------------- Patient/Caregiver Education Details Patient Name: [name] Date of Service: 08/04/2019 1:15 PM Medical Record Number: [id number] Patient Account Number: [id number] Date of Birth/Gender: 1932-09-10 (85 y.o. M) Treating RN: [name] Primary Care Physician: [name] Other Clinician: Referring Physician: Philis [name] Treating Physician/Extender: Sharalyn Ink in Treatment: 0 Education Assessment Education Provided To: Patient Education Topics Provided Venous: Handouts: Controlling Swelling with  Compression Stockings Methods: Demonstration, Explain/Verbal Responses: State content correctly Welcome To The [place]: Handouts: Welcome To The [place] Methods: Demonstration, Explain/Verbal Responses: State content correctly Electronic Signature(s)  Signed: 08/04/2019 5:55:45 PM By: [name], BSN, RN, CWS, Kim RN, BSN Entered By: [name], BSN, RN, CWS, [name] on 08/04/2019 14:03:49 [name] ([id number]) -------------------------------------------------------------------------------- Wound Assessment Details Patient Name: [name] Date of Service: 08/04/2019 1:15 PM Medical Record Number: [id number] Patient Account Number: [id number] Date of Birth/Sex: May 05, 1932 (85 y.o. M) Treating RN: [name] Primary Care Darlen Gledhill: [name] Other Clinician: Referring Emmanuel Ercole: [name] Treating Adalynne Steffensmeier/Extender: [name] Weeks in Treatment: 0 Wound Status Wound Number: 1 Primary Lymphedema Etiology: Wound Location: Right Lower Leg - Posterior Wound Open Wounding Event: Gradually Appeared Status: Date Acquired: 07/25/2019 Comorbid Chronic Obstructive Pulmonary Disease (COPD), Weeks Of Treatment: 0 History: Coronary Artery Disease, Deep Vein Thrombosis, Clustered Wound: No Hypertension, Myocardial Infarction, Type II Diabetes, End Stage Renal Disease, Gout, Osteoarthritis, Neuropathy Photos Wound Measurements Length: (cm) 1.5 % Reducti Width: (cm) 1.2 % Reducti Depth: (cm) 0.2 Tunneling Area: (cm) 1.414 Undermin Volume: (cm) 0.283 on in Area: 0% on in Volume: 0% : No ing: No Wound Description Full Thickness Without Exposed Support Foul Odor Classification: Structures Slough/Fi Wound Margin: Flat and Intact Exudate Large Amount: Exudate Type: Serous Exudate Color: amber After Cleansing: No brino Yes Wound Bed Granulation Amount: Small (1-33%) Exposed Structure Necrotic Amount: Large (67-100%) Fascia Exposed: No Necrotic  Quality: Adherent Slough Fat Layer (Subcutaneous Tissue) Exposed: Yes Tendon Exposed: No Muscle Exposed: No Joint Exposed: No Mcdanel, Jaelynn ([id number]) Bone Exposed: No Treatment Notes Wound #1 (Right, Posterior Lower Leg) Notes Silvercell, nystatin cream, x-sorb, 3Layer bilateral, unna to [employer]) Signed: 08/04/2019 5:55:45 PM By: [name], BSN, RN, CWS, Kim RN, BSN Entered By: [name], BSN, RN, CWS, [name] on 08/04/2019 13:54:59 [name] ([id number]) -------------------------------------------------------------------------------- [place] Details Patient Name: [name] Date of Service: 08/04/2019 1:15 PM Medical Record Number: [id number] Patient Account Number: [id number] Date of Birth/Sex: April 24, 1932 (85 y.o. M) Treating RN: [name] Primary Care Totiana Everson: [name] Other Clinician: Referring Marielena Harvell: [name] Treating Jovonte Commins/Extender: [name] Weeks in Treatment: 0 Vital Signs Time Taken: 13:20 Temperature (F): 98.2 Height (in): 71 Pulse (bpm): 67 Source: Stated Respiratory Rate (breaths/min): 16 Weight (lbs): 289 Blood Pressure (mmHg): 144/88 Source: Measured Reference Range: 80 - 120 mg / dl Body Mass Index (BMI): 40.3 Electronic Signature(s) Signed: 08/04/2019 2:37:26 PM By: [name] RCP, RRT, CHT Entered By: [name] on 08/04/2019 13:22:06

## 2019-08-09 ENCOUNTER — Encounter: Payer: Self-pay | Admitting: Physical Therapy

## 2019-08-09 ENCOUNTER — Other Ambulatory Visit: Payer: Self-pay

## 2019-08-09 ENCOUNTER — Ambulatory Visit: Payer: Medicare Other | Attending: Physician Assistant | Admitting: Physical Therapy

## 2019-08-09 DIAGNOSIS — M6281 Muscle weakness (generalized): Secondary | ICD-10-CM

## 2019-08-09 DIAGNOSIS — Z9181 History of falling: Secondary | ICD-10-CM

## 2019-08-09 DIAGNOSIS — R2681 Unsteadiness on feet: Secondary | ICD-10-CM | POA: Diagnosis present

## 2019-08-09 NOTE — Therapy (Signed)
[facility] [address] [place], [address] Phone: [phone]   Fax:  [phone]  Physical Therapy Evaluation  Patient Details  Name: [name] MRN: [id number] Date of Birth: 04-Jan-1932 Referring Provider (PT): Kathlen Brunswick, [place]   Encounter Date: 08/09/2019  PT End of Session - 08/09/19 1826    Visit Number  1    Number of Visits  12    Date for PT Re-Evaluation  09/20/19    Authorization Type  [employer] reporting from 08/09/2019    Authorization - Visit Number  1    Authorization - Number of Visits  10    PT Start Time  8250    PT Stop Time  5397    PT Time Calculation (min)  52 min    Equipment Utilized During Treatment  Gait belt    Activity Tolerance  Patient tolerated treatment well;Patient limited by fatigue    Behavior During Therapy  [facility] for tasks assessed/performed       Past Medical History:  Diagnosis Date  . Anemia, normocytic normochromic   . BPH (benign prostatic hyperplasia)   . CAD (coronary artery disease)   . Cataracts, bilateral   . Chronic renal insufficiency   . Colon polyp   . COPD (chronic obstructive pulmonary disease) ([place])   . Diabetes mellitus without complication ([place])   . DVT (deep venous thrombosis) ([place])   . Edema of both legs   . Glaucoma   . Gout   . Heart murmur   . Hyperlipidemia   . Hypertension   . Moderate persistent asthma without complication   . Myocardial infarction ([place])   . Neuromuscular disorder ([place])   . Neuropathy   . Peripheral vascular disease ([place])   . Premature ejaculation   . Prostate cancer ([place])   . RLS (restless legs syndrome)   . Sleep apnea   . Stented coronary artery 11/13/2015  . Uncontrolled type 2 diabetes mellitus with stage 3 chronic kidney disease, with long-term current use of insulin ([place])     Past Surgical History:  Procedure Laterality Date  . APPENDECTOMY    . [place]   2009  . COLONOSCOPY  2012  . EYE SURGERY    . PROSTATE SURGERY    . TONSILLECTOMY      There were no vitals filed for this visit.   Subjective Assessment - 08/09/19 1549    Subjective  Per patient daughter the most recent flare up of weakness is around 07/15/2019 (since last hospitalization) when some of his medications were changed. Patient lives with his daughter and the daughter will help with running errands. Patient also has home health assistant 2 x per week to help patient ADLs. Patient has difficulties with stairs climbing, and walking.    Patient is accompained by:  Family member   Daughter   Pertinent History  Relevant past medical history and comorbidities COPD, coronary artery disease, DVT, diabetes mellitus, Stented coronary artery 2017, Lymphedema bilateral lower extremities, etc.; surgeries include colonoscopy, tonsillectomy, and prostate surgery, etc., (See more details in chart.)    Limitations  Lifting;Walking;Standing;House hold activities    How long can you sit comfortably?  hours    How long can you stand comfortably?  10-15 min    How long can you walk comfortably?  7-10 mins    Patient Stated Goals  To become stronger and doesn't need to go to the PT clinic eventually.  Currently in Pain?  No/denies       [facility] PT Assessment - 08/09/19 0001      Assessment   Medical Diagnosis  Severe muscle deconditioning    Referring Provider (PT)  Kathlen Brunswick, PA    Onset Date/Surgical Date  07/15/19    Hand Dominance  Right      Precautions   Precautions  None      Restrictions   Weight Bearing Restrictions  No      Balance Screen   Has the patient fallen in the past 6 months  No   69falls within 2-3 weeks 12/06/2018   Has the patient had a decrease in activity level because of a fear of falling?   Yes    Is the patient reluctant to leave their home because of a fear of falling?   Yes      Home Environment   Living Environment  --   3 stairs with [place]  on R side to get inside of the house     Prior Function   Level of Independence  Independent with community mobility with device    Vocation  Retired      Associate Professor   Overall Cognitive Status  Within Functional Limits for tasks assessed   Fatigue as patient's baseline since the medicaiton change      Observation/Other Assessments-Edema    Edema  --   Bilateral lower legs in warping for wound management     Sensation   Light Touch  Appears Intact      Ambulation/Gait   Stairs  Yes    Stairs Assistance  6: Modified independent (Device/Increase time)    Stair Management Technique  One rail Right    Number of Stairs  --   4       OBJECTIVE  Patient Specific Functional Scale: (0 unable to perform ; 10 without difficulties) . Walking 5 . Sit to stand 5 . Standing 5  MUSCULOSKELETAL: Tremor: Absent  Posture Forward neck and increase kyphosis at thoracal region   Gait Reduced Gait speed and wide base of support  Strength R/L 4-/4- Hip flexion 4+/4+ Hip abduction (seated) 4+/4+ Hip adduction (seated) 4+/4+ Knee extension 4+/4+ Knee flexion 4+/4+ Ankle Plantarflexion 3-/3- Ankle Dorsiflexion Requires additional time to contract muscle. Bilateral dorsiflexion ROM limited due to wrapping at bilateral lower extremities. Patient however, able to resist force from therapist from available range.   NEUROLOGICAL: Sensation Grossly intact to light touch bilateral LEs as determined by testing dermatomes L2-S2 respectively   FUNCTIONAL OUTCOME MEASURES  Results Comments  TUG 27 seconds With SPC as patient baseline  5TSTS 94 seconds Talking to his daughter   10 Meter Gait Speed Self-selected: s = 0.269 m/s;      Objective measurements completed on examination: See above findings.    TREATMENT:   Therapeutic exercise: to centralize symptoms and improve ROM, strength, muscular endurance, and activity tolerance required for successful completion of functional  activities.  - Walking 20 feet with SBA to assess activity tolerance and improve muscle endurance.  - Sit to standing x 10 reps - Patient was educated on diagnosis, anatomy and pathology involved, prognosis, role of PT, and was given an HEP, demonstrating exercise with proper form following verbal and tactile cues, and was given a paper hand out to continue exercise at home. Pt was educated on and agreed to plan of care. - Patient requires extra time to motivate participation for rehab sessions.  HOME EXERCISE PROGRAM Access Code: [email or web address]  URL: [email or web address]  Date: 08/09/2019  Prepared by: Rosita Kea   Exercises Sit to Stand with Armchair - 10 reps - 7x weekly    PT Education - 08/09/19 1837    Education Details  Exercise purpose/form. Self management techniques. Education on diagnosis, prognosis, POC, anatomy and physiology of current condition Education on HEP including handout    Person(s) Educated  Patient;Child(ren)    Methods  Explanation;Demonstration;Tactile cues;Verbal cues;Handout    Comprehension  Verbalized understanding;Returned demonstration;Tactile cues required;Verbal cues required       PT Short Term Goals - 08/09/19 1838      PT SHORT TERM GOAL #1   Title  Be independent with initial home exercise program for self-management of symptoms.    Baseline  initial HEP provided at IE (08/09/2019);    Time  2    Period  Weeks    Status  New    Target Date  08/23/19        PT Long Term Goals - 08/09/19 1838      PT LONG TERM GOAL #1   Title  Be independent with a long-term home exercise program for self-management of symptoms.    Baseline  initial HEP provided at IE (08/09/2019);    Time  6    Period  Weeks    Status  New    Target Date  09/20/19      PT LONG TERM GOAL #2   Title  Patient will improve 10 meter walking speed test within 0.5m/s to demonstrate improved activity tolerance and ambulation safety to reduce fall risk.     Baseline  0.252m/s with SPC (08/09/2019);    Time  6    Period  Weeks    Status  New    Target Date  09/20/19      PT LONG TERM GOAL #3   Title  Patient will demonstrated improved 5 x STS test with BUEs help within 45 s to demonstrate improved LE power/stability/strength to reduce fall risk.    Baseline  94s with BUEs help(08/09/2019);    Time  6    Period  Weeks    Status  New    Target Date  09/20/19      PT LONG TERM GOAL #4   Title  Complete community, work and/or recreational activities without limitation due to current condition.    Baseline  Having difficulties with prolong walking/standing/lifting (08/09/2019);    Time  6    Period  Weeks    Status  New    Target Date  09/20/19      PT LONG TERM GOAL #5   Title  Patient will demonstrated improved TUG test with SPC within 17 s to demonstrate improved LE power/stability/strength/endurance to reduce fall risk and improve activity tolerance.    Baseline  27s with SPC (08/09/2019);    Time  6    Period  Weeks    Status  New    Target Date  09/20/19             Plan - 08/09/19 1827    Clinical Impression Statement  Patient is a 85 y.o. male who presents to outpatient physical therapy with a referral for medical diagnosis of Severe muscle deconditioning. This patient presents with the sign and symptoms consistent with generalized deconditioning, reduced balance, stiffness, weakness, with functional activities/recreational activities. Upon assessment, pt demonstrated deficits such as deficits in strength, muscle endurance, mobility, impaired balance and  weakness. 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 The pt will benefit from skilled PT services to address deficits and return to PLOF and independence, and recreational activity.    Personal Factors and  Comorbidities  Comorbidity 3+;Past/Current Experience;Age    Comorbidities  COPD, coronary artery disease, DVT, diabetes mellitus, Stented coronary artery 2017, Lymphedema bilateral lower extremities    Examination-Activity Limitations  Bathing;Lift;Carry;Stand;Bed Mobility;Bend;Caring for Others;Hygiene/Grooming;Stairs;Squat;Toileting;Locomotion Level;Dressing    Examination-Participation Restrictions  Driving;Yard Work;Shop;Meal Prep;Cleaning;Community Activity;Laundry    Stability/Clinical Decision Making  Evolving/Moderate complexity    Clinical Decision Making  Moderate    Rehab Potential  Fair    PT Frequency  2x / week    PT Duration  6 weeks    PT Treatment/Interventions  ADLs/Self Care Home Management;Cryotherapy;Moist Heat;Functional mobility training;Stair training;Gait training;Therapeutic activities;Therapeutic exercise;Balance training;Neuromuscular re-education;Patient/family education;Manual techniques;Energy conservation;Joint Manipulations    PT Next Visit Plan  strenghthening, muscle endurance and activity tolerance    PT Home Exercise Plan  [email or web address]    Consulted and Agree with Plan of Care  Patient;Family member/caregiver    Family Member Consulted  Daughter [name]       Patient will benefit from skilled therapeutic intervention in order to improve the following deficits and impairments:  Abnormal gait, Decreased activity tolerance, Decreased endurance, Decreased range of motion, Decreased strength, Improper body mechanics, Obesity, Impaired flexibility, Difficulty walking, Decreased safety awareness, Decreased balance, Impaired perceived functional ability, Increased edema, Cardiopulmonary status limiting activity, Decreased mobility, Decreased skin integrity  Visit Diagnosis: Muscle weakness (generalized)  Unsteadiness on feet  History of falling     Problem List There are no active problems to display for this patient.   Sherrilyn Rist, SPT 08/09/19, 7:14  PM  Everlean Alstrom. Graylon Good, PT, DPT 08/09/19, 7:14 PM  [facility] [address] [place], [address] Phone: [phone]   Fax:  [phone]  Name: Moriah Loughry MRN: [id number] Date of Birth: [birth year]

## 2019-08-11 ENCOUNTER — Ambulatory Visit: Payer: Medicare Other | Admitting: Physician Assistant

## 2019-08-11 ENCOUNTER — Ambulatory Visit: Payer: Medicare Other | Admitting: Physical Therapy

## 2019-08-12 ENCOUNTER — Encounter: Payer: Medicare Other | Admitting: Physician Assistant

## 2019-08-12 ENCOUNTER — Other Ambulatory Visit: Payer: Self-pay

## 2019-08-12 DIAGNOSIS — I890 Lymphedema, not elsewhere classified: Secondary | ICD-10-CM | POA: Diagnosis not present

## 2019-08-12 NOTE — Progress Notes (Addendum)
CULVER, FEIGHNER ([id number]) Visit Report for 08/12/2019 Chief Complaint Document Details Patient Name: [name], [name] Date of Service: 08/12/2019 3:45 PM Medical Record Number: [id number] Patient Account Number: [id number] Date of Birth/Sex: 1932/01/12 (85 y.o. M) Treating RN: Montey Hora Primary Care Provider: Philis Fendt Other Clinician: Referring Provider: Philis Fendt Treating Provider/Extender: Melburn Hake, Alexx Giambra Weeks in Treatment: 1 Information Obtained from: Patient Chief Complaint Bilateral LE Lymphedema with right leg ulcer Electronic Signature(s) Signed: 08/12/2019 3:53:18 PM By: Worthy Keeler PA-C Entered By: Worthy Keeler on 08/12/2019 15:53:17 Manwarren, Jodi Mourning ([id number]) -------------------------------------------------------------------------------- Debridement Details Patient Name: [name] Date of Service: 08/12/2019 3:45 PM Medical Record Number: [id number] Patient Account Number: [id number] Date of Birth/Sex: 1932-03-03 (85 y.o. M) Treating RN: Montey Hora Primary Care Provider: Philis Fendt Other Clinician: Referring Provider: Philis Fendt Treating Provider/Extender: Melburn Hake, Eleonor Ocon Weeks in Treatment: 1 Debridement Performed for Wound #1 Right,Posterior Lower Leg Assessment: Performed By: Physician STONE III, Merrit Waugh E., PA-C Debridement Type: Chemical/Enzymatic/Mechanical Agent Used: saline and gauze Level of Consciousness (Pre- Awake and Alert procedure): Pre-procedure Verification/Time Yes - 16:20 Out Taken: Start Time: 16:20 Pain Control: Lidocaine 4% Topical Solution Instrument: Other : gauze Bleeding: None End Time: 16:21 Procedural Pain: 0 Post Procedural Pain: 0 Response to Treatment: Procedure was tolerated well Level of Consciousness Awake and Alert (Post-procedure): Post Debridement Measurements of Total Wound Length: (cm) 1.4 Width: (cm) 1.5 Depth: (cm) 0.2 Volume: (cm) 0.33 Character of Wound/Ulcer  Post Debridement: Improved Post Procedure Diagnosis Same as Pre-procedure Electronic Signature(s) Signed: 08/12/2019 5:17:10 PM By: Montey Hora Signed: 08/13/2019 1:07:32 AM By: Worthy Keeler PA-C Entered By: Montey Hora on 08/12/2019 16:35:07 [name] ([id number]) -------------------------------------------------------------------------------- HPI Details Patient Name: [name] Date of Service: 08/12/2019 3:45 PM Medical Record Number: [id number] Patient Account Number: [id number] Date of Birth/Sex: [birth year] (85 y.o. M) Treating RN: Montey Hora Primary Care Provider: Philis Fendt Other Clinician: Referring Provider: Philis Fendt Treating Provider/Extender: Melburn Hake, Kyndahl Jablon Weeks in Treatment: 1 History of Present Illness HPI Description: 08/04/2019 on evaluation today patient presents for initial evaluation here in our clinic concerning issues that he has been having with lymphedema for quite some time. He has intermittent issues with blistering of his lower extremities for which she will go to the wound center and typically they will wrap him and then subsequently this gets better. That is been an ongoing issue for quite some time. Currently a couple weeks ago he had blisters pop up on the left leg and the right leg currently there is a small spot open on the right but nothing on the left. He does have a history of coronary artery disease, COPD, diabetes mellitus type 2, and hypertension. Fortunately there is no signs of any cellulitis or active infection at this time. He has never had any lymphedema pumps and in fact did not even know what I was talking about when I mention this to him although he has been wrapped and had compression recommended for him long enough he may qualify for going ahead and initiating therapy with lymphedema pumps obviously this would have to be insurance approved. Fortunately there is no signs of any systemic infection either.  No fevers, chills, nausea, vomiting, or diarrhea. He does have stage III lymphedema. 08/12/2019 on evaluation today patient appears to be doing well with regard to his lower extremities the edema is much better than it was during the last evaluation. Fortunately there is no signs of active infection which is great news. No fevers, chills, nausea, vomiting,  or diarrhea. Overall he has been tolerating the wraps without any complication and the wound seems to be doing better as well. We are still working on gathering all the information for ordering the lymphedema pumps at this point. He does need a plan for ongoing compression as well. Electronic Signature(s) Signed: 08/12/2019 4:28:17 PM By: Worthy Keeler PA-C Entered By: Worthy Keeler on 08/12/2019 16:28:16 HALE, CHALFIN ([id number]) -------------------------------------------------------------------------------- Physical Exam Details Patient Name: [name] Date of Service: 08/12/2019 3:45 PM Medical Record Number: [id number] Patient Account Number: [id number] Date of Birth/Sex: 1932-10-17 (85 y.o. M) Treating RN: Montey Hora Primary Care Provider: Philis Fendt Other Clinician: Referring Provider: Philis Fendt Treating Provider/Extender: STONE III, Lyanne Kates Weeks in Treatment: 1 Constitutional Obese and well-hydrated in no acute distress. Respiratory normal breathing without difficulty. clear to auscultation bilaterally. Cardiovascular regular rate and rhythm with normal S1, S2. Psychiatric this patient is able to make decisions and demonstrates good insight into disease process. Alert and Oriented x 3. pleasant and cooperative. Notes Upon inspection today patient's wound actually is showing signs of improvement which is great news he still does have bilateral lower extremity edema and again he is tolerating the compression wraps without significant complication which is good news. Fortunately there is no signs of  active infection at this time which is also good news. I do believe we need to order some Velcro compression wraps as he is really not able to get traditional compression stockings on he tells me. Electronic Signature(s) Signed: 08/12/2019 4:29:04 PM By: Worthy Keeler PA-C Entered By: Worthy Keeler on 08/12/2019 16:29:03 TAYTON, DECAIRE ([id number]) -------------------------------------------------------------------------------- Physician Orders Details Patient Name: [name] Date of Service: 08/12/2019 3:45 PM Medical Record Number: [id number] Patient Account Number: [id number] Date of Birth/Sex: [birth year] (85 y.o. M) Treating RN: Montey Hora Primary Care Provider: Philis Fendt Other Clinician: Referring Provider: Philis Fendt Treating Provider/Extender: Melburn Hake, Aubriel Khanna Weeks in Treatment: 1 Verbal / Phone Orders: No Diagnosis Coding ICD-10 Coding Code Description I89.0 Lymphedema, not elsewhere classified L97.812 Non-pressure chronic ulcer of other part of right lower leg with fat layer exposed B35.4 Tinea corporis I25.10 Atherosclerotic heart disease of native coronary artery without angina pectoris J44.9 Chronic obstructive pulmonary disease, unspecified E11.628 Type 2 diabetes mellitus with other skin complications O70 Essential (primary) hypertension Wound Cleansing Wound #1 Right,Posterior Lower Leg o May shower with protection. - Please do not get your wraps wet Anesthetic (add to Medication List) Wound #1 Right,Posterior Lower Leg o Topical Lidocaine 4% cream applied to wound bed prior to debridement (In Clinic Only). Primary Wound Dressing Wound #1 Right,Posterior Lower Leg o Silver Alginate Secondary Dressing Wound #1 Right,Posterior Lower Leg o ABD pad Dressing Change Frequency Wound #1 Right,Posterior Lower Leg o Change dressing every week o Other: - nurse visit if needed Follow-up Appointments Wound #1 Right,Posterior Lower  Leg o Return Appointment in 1 week. o Nurse Visit as needed Edema Control Wound #1 Right,Posterior Lower Leg o 4 Layer Compression System - Bilateral - anchor with unna o Elevate legs to the level of the heart and pump ankles as often as possible CHASYN, CINQUE ([id number]) Home Health Wound #1 [place] [facility] - [place] [phone]) o Home Health Nurse may visit PRN to address patientos wound care needs. o FACE TO FACE ENCOUNTER: MEDICARE and MEDICAID PATIENTS: I certify that this patient is under my care and that I had a face-to-face encounter that meets the physician face-to-face encounter requirements with  this patient on this date. The encounter with the patient was in whole or in part for the following MEDICAL CONDITION: (primary reason for [place]) MEDICAL NECESSITY: I certify, that based on my findings, NURSING services are a medically necessary home health service. HOME BOUND STATUS: I certify that my clinical findings support that this patient is homebound (i.e., Due to illness or injury, pt requires aid of supportive devices such as crutches, cane, wheelchairs, walkers, the use of special transportation or the assistance of another person to leave their place of residence. There is a normal inability to leave the home and doing so requires considerable and taxing effort. Other absences are for medical reasons / religious services and are infrequent or of short duration when for other reasons). o If current dressing causes regression in wound condition, may D/C ordered dressing product/s and apply Normal Saline Moist Dressing daily until next [place] / Other MD appointment. [place] of regression in wound condition at [phone]. o Please direct any NON-WOUND related issues/requests for orders to patient's Primary Care Physician Electronic Signature(s) Signed:  08/12/2019 5:17:10 PM By: Montey Hora Signed: 08/13/2019 1:07:32 AM By: Worthy Keeler PA-C Entered By: Montey Hora on 08/12/2019 16:15:11 TOLUWANI, YADAV ([id number]) -------------------------------------------------------------------------------- Problem List Details Patient Name: [name] Date of Service: 08/12/2019 3:45 PM Medical Record Number: [id number] Patient Account Number: [id number] Date of Birth/Sex: March 20, 1932 (85 y.o. M) Treating RN: Montey Hora Primary Care Provider: Philis Fendt Other Clinician: Referring Provider: Philis Fendt Treating Provider/Extender: Melburn Hake, Dong Nimmons Weeks in Treatment: 1 Active Problems ICD-10 Evaluated Encounter Code Description Active Date Today Diagnosis I89.0 Lymphedema, not elsewhere classified 08/04/2019 No Yes L97.812 Non-pressure chronic ulcer of other part of right lower leg 08/04/2019 No Yes with fat layer exposed B35.4 Tinea corporis 08/04/2019 No Yes I25.10 Atherosclerotic heart disease of native coronary artery 08/04/2019 No Yes without angina pectoris J44.9 Chronic obstructive pulmonary disease, unspecified 08/04/2019 No Yes E11.628 Type 2 diabetes mellitus with other skin complications 37/85/8850 No Yes I10 Essential (primary) hypertension 08/04/2019 No Yes Inactive Problems Resolved Problems Electronic Signature(s) Signed: 08/12/2019 3:53:12 PM By: Worthy Keeler PA-C Entered By: Worthy Keeler on 08/12/2019 15:53:12 Allegretto, Jodi Mourning ([id number]) -------------------------------------------------------------------------------- Progress Note Details Patient Name: [name] Date of Service: 08/12/2019 3:45 PM Medical Record Number: [id number] Patient Account Number: [id number] Date of Birth/Sex: [birth year] (85 y.o. M) Treating RN: Montey Hora Primary Care Provider: Philis Fendt Other Clinician: Referring Provider: Philis Fendt Treating Provider/Extender: Melburn Hake, Eleanora Guinyard Weeks in  Treatment: 1 Subjective Chief Complaint Information obtained from Patient Bilateral LE Lymphedema with right leg ulcer History of Present Illness (HPI) 08/04/2019 on evaluation today patient presents for initial evaluation here in our clinic concerning issues that he has been having with lymphedema for quite some time. He has intermittent issues with blistering of his lower extremities for which she will go to the wound center and typically they will wrap him and then subsequently this gets better. That is been an ongoing issue for quite some time. Currently a couple weeks ago he had blisters pop up on the left leg and the right leg currently there is a small spot open on the right but nothing on the left. He does have a history of coronary artery disease, COPD, diabetes mellitus type 2, and hypertension. Fortunately there is no signs of any cellulitis or active infection at this time. He has never had any lymphedema pumps and in fact did not even know what I was talking  about when I mention this to him although he has been wrapped and had compression recommended for him long enough he may qualify for going ahead and initiating therapy with lymphedema pumps obviously this would have to be insurance approved. Fortunately there is no signs of any systemic infection either. No fevers, chills, nausea, vomiting, or diarrhea. He does have stage III lymphedema. 08/12/2019 on evaluation today patient appears to be doing well with regard to his lower extremities the edema is much better than it was during the last evaluation. Fortunately there is no signs of active infection which is great news. No fevers, chills, nausea, vomiting, or diarrhea. Overall he has been tolerating the wraps without any complication and the wound seems to be doing better as well. We are still working on gathering all the information for ordering the lymphedema pumps at this point. He does need a plan for ongoing compression as  well. Patient History Information obtained from Patient. Family History Diabetes - Child, No family history of Cancer, Heart Disease, Hereditary Spherocytosis, Hypertension, Kidney Disease, Lung Disease, Seizures, Stroke, Thyroid Problems, Tuberculosis. Social History Former smoker - 2000, Marital Status - Widowed, Alcohol Use - Never - quit 2000, Drug Use - No History, Caffeine Use - Daily. Medical History Respiratory Patient has history of Chronic Obstructive Pulmonary Disease (COPD) Denies history of Aspiration, Asthma, Pneumothorax, Sleep Apnea, Tuberculosis Cardiovascular Patient has history of Coronary Artery Disease, Deep Vein Thrombosis, Hypertension, Myocardial Infarction Denies history of Angina, Arrhythmia, Congestive Heart Failure, Hypotension, Peripheral Arterial Disease, Peripheral Venous Disease, Phlebitis, Vasculitis Endocrine Patient has history of Type II Diabetes Denies history of Type I Diabetes MIRANDA, GARBER ([id number]) Genitourinary Patient has history of End Stage Renal Disease - CKD Integumentary (Skin) Denies history of History of Burn, History of pressure wounds Musculoskeletal Patient has history of Gout, Osteoarthritis Denies history of Rheumatoid Arthritis, Osteomyelitis Neurologic Patient has history of Neuropathy Denies history of Dementia, Quadriplegia, Paraplegia, Seizure Disorder Medical And Surgical History Notes Cardiovascular HLD, venous stasis Integumentary (Skin) history of venous ulcers Review of Systems (ROS) Constitutional Symptoms (General Health) Denies complaints or symptoms of Fatigue, Fever, Chills, Marked Weight Change. Respiratory Denies complaints or symptoms of Chronic or frequent coughs, Shortness of Breath. Cardiovascular Complains or has symptoms of LE edema. Denies complaints or symptoms of Chest pain. Psychiatric Denies complaints or symptoms of Anxiety, Claustrophobia. Objective Constitutional Obese and  well-hydrated in no acute distress. Vitals Time Taken: 3:55 PM, Height: 71 in, Weight: 289 lbs, BMI: 40.3, Temperature: 98.6 F, Pulse: 72 bpm, Respiratory Rate: 18 breaths/min, Blood Pressure: 136/59 mmHg. Respiratory normal breathing without difficulty. clear to auscultation bilaterally. Cardiovascular regular rate and rhythm with normal S1, S2. Psychiatric this patient is able to make decisions and demonstrates good insight into disease process. Alert and Oriented x 3. pleasant and cooperative. General Notes: Upon inspection today patient's wound actually is showing signs of improvement which is great news he still does have bilateral lower extremity edema and again he is tolerating the compression wraps without significant complication which is good news. Fortunately there is no signs of active infection at this time which is also good news. I do believe we NEHEMIAS, SAUCEDA ([id number]) need to order some Velcro compression wraps as he is really not able to get traditional compression stockings on he tells me. Integumentary (Hair, Skin) Wound #1 status is Open. Original cause of wound was Gradually Appeared. The wound is located on the Right,Posterior Lower Leg. The wound measures 1.4cm length x 1.5cm width x 0.2cm  depth; 1.649cm^2 area and 0.33cm^3 volume. There is Fat Layer (Subcutaneous Tissue) Exposed exposed. There is no tunneling or undermining noted. There is a medium amount of serous drainage noted. The wound margin is flat and intact. There is small (1-33%) granulation within the wound bed. There is a large (67-100%) amount of necrotic tissue within the wound bed including Adherent Slough. Assessment Active Problems ICD-10 Lymphedema, not elsewhere classified Non-pressure chronic ulcer of other part of right lower leg with fat layer exposed Tinea corporis Atherosclerotic heart disease of native coronary artery without angina pectoris Chronic obstructive pulmonary disease,  unspecified Type 2 diabetes mellitus with other skin complications Essential (primary) hypertension Procedures Wound #1 Pre-procedure diagnosis of Wound #1 is a Lymphedema located on the Right,Posterior Lower Leg . There was a Four Layer Compression Therapy Procedure with a pre-treatment ABI of 1.1 by Montey Hora, RN. Post procedure Diagnosis Wound #1: Same as Pre-Procedure Plan Wound Cleansing: Wound #1 Right,Posterior Lower Leg: May shower with protection. - Please do not get your wraps wet Anesthetic (add to Medication List): Wound #1 Right,Posterior Lower Leg: Topical Lidocaine 4% cream applied to wound bed prior to debridement (In Clinic Only). Primary Wound Dressing: Wound #1 Right,Posterior Lower Leg: Silver Alginate Secondary Dressing: Wound #1 Right,Posterior Lower Leg: ABD pad Dressing Change Frequency: Wound #1 Right,Posterior Lower Leg: Grayer, Duanne ([id number]) Change dressing every week Other: - nurse visit if needed Follow-up Appointments: Wound #1 Right,Posterior Lower Leg: Return Appointment in 1 week. Nurse Visit as needed Edema Control: Wound #1 Right,Posterior Lower Leg: 4 Layer Compression System - Bilateral - anchor with unna Elevate legs to the level of the heart and pump ankles as often as possible Home Health: Wound #1 Right,Posterior Lower Leg: [place] Visits - [place] [phone]) Home Health Nurse may visit PRN to address patient s wound care needs. FACE TO FACE ENCOUNTER: MEDICARE and MEDICAID PATIENTS: I certify that this patient is under my care and that I had a face-to-face encounter that meets the physician face-to-face encounter requirements with this patient on this date. The encounter with the patient was in whole or in part for the following MEDICAL CONDITION: (primary reason for [place]) MEDICAL NECESSITY: I certify, that based on my findings, NURSING services are a medically necessary  home health service. HOME BOUND STATUS: I certify that my clinical findings support that this patient is homebound (i.e., Due to illness or injury, pt requires aid of supportive devices such as crutches, cane, wheelchairs, walkers, the use of special transportation or the assistance of another person to leave their place of residence. There is a normal inability to leave the home and doing so requires considerable and taxing effort. Other absences are for medical reasons / religious services and are infrequent or of short duration when for other reasons). If current dressing causes regression in wound condition, may D/C ordered dressing product/s and apply Normal Saline Moist Dressing daily until next [place] / Other MD appointment. [place] of regression in wound condition at [phone]. Please direct any NON-WOUND related issues/requests for orders to patient's Primary Care Physician 1. I would recommend currently we continue with the current wound care measures which includes the silver alginate to the wound bed followed by the 4 layer compression wrap bilaterally. 2. Would recommend as well that we go ahead and initiate the order for the lymphedema pumps which again I think would be beneficial for him ongoing for keeping the edema and swelling under  better control. 3. With regard to ongoing compression were also going to look into ordering Velcro compression wraps for the patient which I think will also be of benefit for him. 4. I do think still elevation is a good thing and they do have a bed where he can lay down now as well as a lift chair so he can get his legs up more this is good news. We will see patient back for reevaluation in 1 week here in the clinic. If anything worsens or changes patient will contact our office for additional recommendations. Electronic Signature(s) Signed: 08/12/2019 4:30:05 PM By: Worthy Keeler PA-C Entered By: Worthy Keeler on 08/12/2019 16:30:05 ATHEN, RIEL ([id number]) -------------------------------------------------------------------------------- ROS/PFSH Details Patient Name: [name] Date of Service: 08/12/2019 3:45 PM Medical Record Number: [id number] Patient Account Number: [id number] Date of Birth/Sex: [birth year] (85 y.o. M) Treating RN: Montey Hora Primary Care Provider: Philis Fendt Other Clinician: Referring Provider: Philis Fendt Treating Provider/Extender: Melburn Hake, Raad Clayson Weeks in Treatment: 1 Information Obtained From Patient Constitutional Symptoms (General Health) Complaints and Symptoms: Negative for: Fatigue; Fever; Chills; Marked Weight Change Respiratory Complaints and Symptoms: Negative for: Chronic or frequent coughs; Shortness of Breath Medical History: Positive for: Chronic Obstructive Pulmonary Disease (COPD) Negative for: Aspiration; Asthma; Pneumothorax; Sleep Apnea; Tuberculosis Cardiovascular Complaints and Symptoms: Positive for: LE edema Negative for: Chest pain Medical History: Positive for: Coronary Artery Disease; Deep Vein Thrombosis; Hypertension; Myocardial Infarction Negative for: Angina; Arrhythmia; Congestive Heart Failure; Hypotension; Peripheral Arterial Disease; Peripheral Venous Disease; Phlebitis; Vasculitis Past Medical History Notes: HLD, venous stasis Psychiatric Complaints and Symptoms: Negative for: Anxiety; Claustrophobia Endocrine Medical History: Positive for: Type II Diabetes Negative for: Type I Diabetes Time with diabetes: 5 years Treated with: Insulin Blood sugar tested every day: Yes Tested : TID Genitourinary Medical History: Positive for: End Stage Renal Disease - CKD Saric, Jahmai ([id number]) Integumentary (Skin) Medical History: Negative for: History of Burn; History of pressure wounds Past Medical History Notes: history of venous ulcers Musculoskeletal Medical History: Positive for: Gout;  Osteoarthritis Negative for: Rheumatoid Arthritis; Osteomyelitis Neurologic Medical History: Positive for: Neuropathy Negative for: Dementia; Quadriplegia; Paraplegia; Seizure Disorder Immunizations Pneumococcal Vaccine: Received Pneumococcal Vaccination: Yes Immunization Notes: up to date Implantable Devices None Family and Social History Cancer: No; Diabetes: Yes - Child; Heart Disease: No; Hereditary Spherocytosis: No; Hypertension: No; Kidney Disease: No; Lung Disease: No; Seizures: No; Stroke: No; Thyroid Problems: No; Tuberculosis: No; Former smoker - 2000; Marital Status - Widowed; Alcohol Use: Never - quit 2000; Drug Use: No History; Caffeine Use: Daily; Financial Concerns: No; Food, Clothing or Shelter Needs: No; Support System Lacking: No; Transportation Concerns: No Physician Affirmation I have reviewed and agree with the above information. Electronic Signature(s) Signed: 08/12/2019 5:17:10 PM By: Montey Hora Signed: 08/13/2019 1:07:32 AM By: Worthy Keeler PA-C Entered By: Worthy Keeler on 08/12/2019 16:28:33 DEONTREY, MASSI ([id number]) -------------------------------------------------------------------------------- SuperBill Details Patient Name: [name] Date of Service: 08/12/2019 Medical Record Number: [id number] Patient Account Number: [id number] Date of Birth/Sex: 11/25/1931 (85 y.o. 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 Physician Procedures CPT4 Code Description: [id number] [id number] - WC PHYS LEVEL 4 - EST PT ICD-10 Diagnosis Description I89.0 Lymphedema, not elsewhere classified V88.677 Non-pressure chronic ulcer of other part of right lower leg w B35.4 Tinea corporis I25.10 Atherosclerotic  heart disease of native coronary artery witho Modifier: ith fat layer expo ut angina pectoris Quantity: 1 sed Electronic Signature(s) Signed: 08/12/2019 4:31:17 PM By: Worthy Keeler PA-C Entered By: Worthy Keeler on 08/12/2019 16:31:16

## 2019-08-12 NOTE — Progress Notes (Addendum)
Brandon, [name] ([id number]) Visit Report for 08/12/2019 Arrival Information Details Patient Name: [name], [name] Date of Service: 08/12/2019 3:45 PM Medical Record Number: [id number] Patient Account Number: [id number] Date of Birth/Sex: [birth year] (85 y.o. M) Treating RN: Montey Hora Primary Care Dajohn Ellender: Philis Fendt Other Clinician: Referring Bryker Fletchall: Philis Fendt Treating Delsin Copen/Extender: Melburn Hake, HOYT Weeks in Treatment: 1 Visit Information History Since Last Visit Added or deleted any medications: No Patient Arrived: Cane Any new allergies or adverse reactions: No Arrival Time: 15:56 Had a fall or experienced change in No Accompanied By: [name] activities of daily living that may affect Transfer Assistance: None risk of falls: Patient Identification Verified: Yes Signs or symptoms of abuse/neglect since last visito No Secondary Verification Process Completed: Yes Hospitalized since last visit: No Implantable device outside of the clinic excluding No cellular tissue based products placed in the center since last visit: Has Dressing in Place as Prescribed: Yes Pain Present Now: Yes Electronic Signature(s) Signed: 08/12/2019 4:16:28 PM By: Lorine Bears RCP, RRT, CHT Entered By: Lorine Bears on 08/12/2019 15:58:30 Albritton, Jodi Mourning ([id number]) -------------------------------------------------------------------------------- Compression Therapy Details Patient Name: [name] Date of Service: 08/12/2019 3:45 PM Medical Record Number: [id number] Patient Account Number: [id number] Date of Birth/Sex: [birth year] (85 y.o. M) Treating RN: Montey Hora Primary Care Hideko Esselman: Philis Fendt Other Clinician: Referring Cyera Balboni: Philis Fendt Treating Ausar Georgiou/Extender: Melburn Hake, HOYT Weeks in Treatment: 1 Compression Therapy Performed for Wound Assessment: Wound #1 Right,Posterior Lower Leg Performed By: Clinician Montey Hora, RN Compression Type: Four Layer Pre Treatment ABI: 1.1 Post Procedure Diagnosis Same as Pre-procedure Electronic Signature(s) Signed: 08/12/2019 5:17:10 PM By: Montey Hora Entered By: Montey Hora on 08/12/2019 16:15:25 [name] ([id number]) -------------------------------------------------------------------------------- Encounter Discharge Information Details Patient Name: [name] Date of Service: 08/12/2019 3:45 PM Medical Record Number: [id number] Patient Account Number: [id number] Date of Birth/Sex: 11-15-1931 (85 y.o. M) Treating RN: Montey Hora Primary Care Enna Warwick: Philis Fendt Other Clinician: Referring Isauro Skelley: Philis Fendt Treating Hayes Czaja/Extender: Melburn Hake, HOYT Weeks in Treatment: 1 Encounter Discharge Information Items Discharge Condition: Stable Ambulatory Status: Cane Discharge Destination: Home Transportation: Private Auto Accompanied By: daughter Schedule Follow-up Appointment: Yes Clinical Summary of Care: Electronic Signature(s) Signed: 08/12/2019 5:17:10 PM By: Montey Hora Entered By: Montey Hora on 08/12/2019 16:21:04 [name] ([id number]) -------------------------------------------------------------------------------- Lower Extremity Assessment Details Patient Name: [name] Date of Service: 08/12/2019 3:45 PM Medical Record Number: [id number] Patient Account Number: [id number] Date of Birth/Sex: [birth year] (85 y.o. M) Treating RN: Cornell Barman Primary Care Yui Mulvaney: Philis Fendt Other Clinician: Referring Aris Even: Philis Fendt Treating Cinthya Bors/Extender: Melburn Hake, HOYT Weeks in Treatment: 1 Edema Assessment Assessed: [Left: No] [Right: No] Edema: [Left: Yes] [Right: Yes] Calf Left: Right: Point of Measurement: 37 cm From Medial Instep 43.5 cm 44 cm Ankle Left: Right: Point of Measurement: 10 cm From Medial Instep 30 cm 31.5 cm Vascular Assessment Pulses: Dorsalis  Pedis Palpable: [Left:Yes] [Right:Yes] Electronic Signature(s) Signed: 08/12/2019 5:17:10 PM By: Montey Hora Signed: 08/12/2019 5:23:28 PM By: Gretta Cool, BSN, RN, CWS, Kim RN, BSN Entered By: Montey Hora on 08/12/2019 16:12:47 ZAYVIAN, MCMURTRY ([id number]) -------------------------------------------------------------------------------- Multi Wound Chart Details Patient Name: [name] Date of Service: 08/12/2019 3:45 PM Medical Record Number: [id number] Patient Account Number: [id number] Date of Birth/Sex: [birth year] (85 y.o. M) Treating RN: Montey Hora Primary Care Siyah Mault: Philis Fendt Other Clinician: Referring Eun Vermeer: Philis Fendt Treating Shayma Pfefferle/Extender: Melburn Hake, HOYT Weeks in Treatment: 1 Vital Signs Height(in): 71 Pulse(bpm): 72 Weight(lbs): 289 Blood Pressure(mmHg): 136/59 Body Mass Index(BMI): 40 Temperature(F): 98.6 Respiratory Rate 18 (breaths/min):  Photos: [N/A:N/A] Wound Location: Right Lower Leg - Posterior N/A N/A Wounding Event: Gradually Appeared N/A N/A Primary Etiology: Lymphedema N/A N/A Comorbid History: Chronic Obstructive N/A N/A Pulmonary Disease (COPD), Coronary Artery Disease, Deep Vein Thrombosis, Hypertension, Myocardial Infarction, Type II Diabetes, End Stage Renal Disease, Gout, Osteoarthritis, Neuropathy Date Acquired: 07/25/2019 N/A N/A Weeks of Treatment: 1 N/A N/A Wound Status: Open N/A N/A Measurements L x W x D 1.4x1.5x0.2 N/A N/A (cm) Area (cm) : 1.649 N/A N/A Volume (cm) : 0.33 N/A N/A % Reduction in Area: -16.60% N/A N/A % Reduction in Volume: -16.60% N/A N/A Classification: Full Thickness Without N/A N/A Exposed Support Structures Exudate Amount: Medium N/A N/A Exudate Type: Serous N/A N/A Exudate Color: amber N/A N/A Wound Margin: Flat and Intact N/A N/A Granulation Amount: Small (1-33%) N/A N/A Necrotic Amount: Large (67-100%) N/A N/A Barbe, Jaxsun ([id number]) Exposed Structures: Fat Layer  (Subcutaneous N/A N/A Tissue) Exposed: Yes Fascia: No Tendon: No Muscle: No Joint: No Bone: No Epithelialization: None N/A N/A Debridement: Chemical/Enzymatic/Mechanical N/A N/A Pre-procedure 16:20 N/A N/A Verification/Time Out Taken: Pain Control: Lidocaine 4% Topical Solution N/A N/A Instrument: Other(gauze) N/A N/A Bleeding: None N/A N/A Procedural Pain: 0 N/A N/A Post Procedural Pain: 0 N/A N/A Debridement Treatment Procedure was tolerated well N/A N/A Response: Post Debridement 1.4x1.5x0.2 N/A N/A Measurements L x W x D (cm) Post Debridement Volume: 0.33 N/A N/A (cm) Procedures Performed: Compression Therapy N/A N/A Debridement Treatment Notes Wound #1 (Right, Posterior Lower Leg) Notes Silvercell, abd, 4 Layer bilateral, unna to [employer]) Signed: 08/12/2019 5:17:10 PM By: Montey Hora Entered By: Montey Hora on 08/12/2019 16:35:26 [place], Jodi Mourning ([id number]) -------------------------------------------------------------------------------- Multi-Disciplinary Care Plan Details Patient Name: [name] Date of Service: 08/12/2019 3:45 PM Medical Record Number: [id number] Patient Account Number: [id number] Date of Birth/Sex: 03-18-1932 (85 y.o. M) Treating RN: Montey Hora Primary Care Petros Ahart: Philis Fendt Other Clinician: Referring Lorine Iannaccone: Philis Fendt Treating Ihsan Nomura/Extender: Melburn Hake, HOYT Weeks in Treatment: 1 Active Inactive Necrotic Tissue Nursing Diagnoses: Impaired tissue integrity related to necrotic/devitalized tissue Goals: Necrotic/devitalized tissue will be minimized in the wound bed Date Initiated: 08/04/2019 Target Resolution Date: 08/01/2019 Goal Status: Active Interventions: Assess patient pain level pre-, during and post procedure and prior to discharge Treatment Activities: Apply topical anesthetic as ordered : 08/04/2019 Notes: Orientation to the Wound Care Program Nursing  Diagnoses: Knowledge deficit related to the wound healing center program Goals: Patient/caregiver will verbalize understanding of the [place] Date Initiated: 08/04/2019 Target Resolution Date: 08/09/2019 Goal Status: Active Interventions: Provide education on orientation to the wound center Notes: Venous Leg Ulcer Nursing Diagnoses: Actual venous Insuffiency (use after diagnosis is confirmed) Goals: Patient will maintain optimal edema control Date Initiated: 08/04/2019 Target Resolution Date: 08/24/2019 Goal Status: Active Stepp, Genaro ([id number]) Interventions: Assess peripheral edema status every visit. Treatment Activities: Therapeutic compression applied : 08/04/2019 Notes: Electronic Signature(s) Signed: 08/12/2019 5:17:10 PM By: Montey Hora Entered By: Montey Hora on 08/12/2019 16:09:48 HALIM, SURRETTE ([id number]) -------------------------------------------------------------------------------- Pain Assessment Details Patient Name: [name] Date of Service: 08/12/2019 3:45 PM Medical Record Number: [id number] Patient Account Number: [id number] Date of Birth/Sex: May 24, 1932 (85 y.o. M) Treating RN: Montey Hora Primary Care Jodell Weitman: Philis Fendt Other Clinician: Referring Maclovio Henson: Philis Fendt Treating Rim Thatch/Extender: Melburn Hake, HOYT Weeks in Treatment: 1 Active Problems Location of Pain Severity and Description of Pain Patient Has Paino Yes Site Locations Rate the pain. Current Pain Level: 8 Pain Management and Medication Current Pain Management: Electronic Signature(s) Signed: 08/12/2019 4:16:28 PM By: Juleen China,  RCP,RRT,CHT, Sallie RCP, RRT, CHT Signed: 08/12/2019 5:17:10 PM By: Montey Hora Entered By: Lorine Bears on 08/12/2019 15:58:39 [name] ([id number]) -------------------------------------------------------------------------------- Patient/Caregiver Education Details Patient  Name: [name] Date of Service: 08/12/2019 3:45 PM Medical Record Number: [id number] Patient Account Number: [id number] Date of Birth/Gender: 11-Jun-1932 (85 y.o. M) Treating RN: Montey Hora Primary Care Physician: Philis Fendt Other Clinician: Referring Physician: Philis Fendt Treating Physician/Extender: Sharalyn Ink in Treatment: 1 Education Assessment Education Provided To: Patient and Caregiver Education Topics Provided Venous: Handouts: Other: need for ongoing compression Methods: Explain/Verbal Responses: State content correctly Electronic Signature(s) Signed: 08/12/2019 5:17:10 PM By: Montey Hora Entered By: Montey Hora on 08/12/2019 16:15:56 [place], Jodi Mourning ([id number]) -------------------------------------------------------------------------------- Wound Assessment Details Patient Name: [name] Date of Service: 08/12/2019 3:45 PM Medical Record Number: [id number] Patient Account Number: [id number] Date of Birth/Sex: 21-Apr-1932 (85 y.o. M) Treating RN: Cornell Barman Primary Care Trayon Krantz: Philis Fendt Other Clinician: Referring Tomeika Weinmann: Philis Fendt Treating Kamorah Nevils/Extender: Melburn Hake, HOYT Weeks in Treatment: 1 Wound Status Wound Number: 1 Primary Lymphedema Etiology: Wound Location: Right Lower Leg - Posterior Wound Open Wounding Event: Gradually Appeared Status: Date Acquired: 07/25/2019 Comorbid Chronic Obstructive Pulmonary Disease (COPD), Weeks Of Treatment: 1 History: Coronary Artery Disease, Deep Vein Thrombosis, Clustered Wound: No Hypertension, Myocardial Infarction, Type II Diabetes, End Stage Renal Disease, Gout, Osteoarthritis, Neuropathy Photos Wound Measurements Length: (cm) 1.4 Width: (cm) 1.5 Depth: (cm) 0.2 Area: (cm) 1.649 Volume: (cm) 0.33 % Reduction in Area: -16.6% % Reduction in Volume: -16.6% Epithelialization: None Tunneling: No Undermining: No Wound Description Full Thickness  Without Exposed Support Foul Odor Classification: Structures Slough/Fi Wound Margin: Flat and Intact Exudate Medium Amount: Exudate Type: Serous Exudate Color: amber After Cleansing: No brino Yes Wound Bed Granulation Amount: Small (1-33%) Exposed Structure Necrotic Amount: Large (67-100%) Fascia Exposed: No Necrotic Quality: Adherent Slough Fat Layer (Subcutaneous Tissue) Exposed: Yes Tendon Exposed: No Muscle Exposed: No Joint Exposed: No Bianca, Semir ([id number]) Bone Exposed: No Treatment Notes Wound #1 (Right, Posterior Lower Leg) Notes Silvercell, abd, 4 Layer bilateral, unna to [employer]) Signed: 08/12/2019 5:23:28 PM By: Gretta Cool, BSN, RN, CWS, Kim RN, BSN Entered By: Gretta Cool, BSN, RN, CWS, Kim on 08/12/2019 16:01:09 [name] ([id number]) -------------------------------------------------------------------------------- Vitals Details Patient Name: [name] Date of Service: 08/12/2019 3:45 PM Medical Record Number: [id number] Patient Account Number: [id number] Date of Birth/Sex: [birth year] (85 y.o. M) Treating RN: Montey Hora Primary Care Arianna Delsanto: Philis Fendt Other Clinician: Referring Jourden Delmont: Philis Fendt Treating Vyron Fronczak/Extender: Melburn Hake, HOYT Weeks in Treatment: 1 Vital Signs Time Taken: 15:55 Temperature (F): 98.6 Height (in): 71 Pulse (bpm): 72 Weight (lbs): 289 Respiratory Rate (breaths/min): 18 Body Mass Index (BMI): 40.3 Blood Pressure (mmHg): 136/59 Reference Range: 80 - 120 mg / dl Electronic Signature(s) Signed: 08/12/2019 4:16:28 PM By: Lorine Bears RCP, RRT, CHT Entered By: Lorine Bears on 08/12/2019 15:59:07

## 2019-08-16 ENCOUNTER — Ambulatory Visit: Payer: Medicare Other | Admitting: Physical Therapy

## 2019-08-19 ENCOUNTER — Other Ambulatory Visit: Payer: Self-pay

## 2019-08-19 ENCOUNTER — Encounter: Payer: Medicare Other | Admitting: Internal Medicine

## 2019-08-19 DIAGNOSIS — I890 Lymphedema, not elsewhere classified: Secondary | ICD-10-CM | POA: Diagnosis not present

## 2019-08-19 NOTE — Progress Notes (Signed)
EARL, LOSEE ([id number]) Visit Report for 08/19/2019 Arrival Information Details Patient Name: [name], [name] Date of Service: 08/19/2019 3:45 PM Medical Record Number: [id number] Patient Account Number: [id number] Date of Birth/Sex: 1932-08-02 (85 y.o. M) Treating RN: Harold Barban Primary Care Jaggar Benko: Philis Fendt Other Clinician: Referring Tanica Gaige: Philis Fendt Treating Odes Lolli/Extender: Tito Dine in Treatment: 2 Visit Information History Since Last Visit Added or deleted any medications: No Patient Arrived: Cane Any new allergies or adverse reactions: No Arrival Time: 16:20 Had a fall or experienced change in No Accompanied By: self activities of daily living that may affect Transfer Assistance: None risk of falls: Patient Identification Verified: Yes Signs or symptoms of abuse/neglect since last visito No Secondary Verification Process Completed: Yes Hospitalized since last visit: No Has Dressing in Place as Prescribed: Yes Has Compression in Place as Prescribed: Yes Pain Present Now: No Electronic Signature(s) Signed: 08/19/2019 4:47:34 PM By: Harold Barban Entered By: Harold Barban on 08/19/2019 16:20:43 [place], [name] ([id number]) -------------------------------------------------------------------------------- Compression Therapy Details Patient Name: [name] Date of Service: 08/19/2019 3:45 PM Medical Record Number: [id number] Patient Account Number: [id number] Date of Birth/Sex: [birth year] (85 y.o. M) Treating RN: Montey Hora Primary Care Bronson Bressman: Philis Fendt Other Clinician: Referring Quintel Mccalla: Philis Fendt Treating Tyiesha Brackney/Extender: Tito Dine in Treatment: 2 Compression Therapy Performed for Wound Assessment: Wound #1 Right,Posterior Lower Leg Performed By: Clinician Montey Hora, RN Compression Type: Four Layer Pre Treatment ABI: 1.1 Post Procedure Diagnosis Same as  Pre-procedure Electronic Signature(s) Signed: 08/19/2019 5:07:31 PM By: Montey Hora Entered By: Montey Hora on 08/19/2019 16:43:13 [place], [name] ([id number]) -------------------------------------------------------------------------------- Encounter Discharge Information Details Patient Name: [name] Date of Service: 08/19/2019 3:45 PM Medical Record Number: [id number] Patient Account Number: [id number] Date of Birth/Sex: 02-Sep-1932 (85 y.o. M) Treating RN: Montey Hora Primary Care Lucky Trotta: Philis Fendt Other Clinician: Referring Athens Lebeau: Philis Fendt Treating Marynell Bies/Extender: Tito Dine in Treatment: 2 Encounter Discharge Information Items Discharge Condition: Stable Ambulatory Status: Ambulatory Discharge Destination: Home Transportation: Private Auto Accompanied By: daughter Schedule Follow-up Appointment: Yes Clinical Summary of Care: Electronic Signature(s) Signed: 08/19/2019 5:07:31 PM By: Montey Hora Entered By: Montey Hora on 08/19/2019 16:45:34 [name], [name] ([id number]) -------------------------------------------------------------------------------- Lower Extremity Assessment Details Patient Name: [name] Date of Service: 08/19/2019 3:45 PM Medical Record Number: [id number] Patient Account Number: [id number] Date of Birth/Sex: 1931/11/15 (85 y.o. M) Treating RN: Harold Barban Primary Care Yassmine Tamm: Philis Fendt Other Clinician: Referring Alorah Mcree: Philis Fendt Treating Mallory Enriques/Extender: Tito Dine in Treatment: 2 Edema Assessment Assessed: [Left: No] [Right: No] E[Left: dema] [Right: :] Calf Left: Right: Point of Measurement: 37 cm From Medial Instep 43 cm 44.2 cm Ankle Left: Right: Point of Measurement: 10 cm From Medial Instep 31 cm 31 cm Vascular Assessment Pulses: Dorsalis Pedis Palpable: [Left:Yes] [Right:Yes] Posterior Tibial Palpable: [Left:Yes] [Right:Yes] Electronic  Signature(s) Signed: 08/19/2019 4:47:34 PM By: Harold Barban Entered By: Harold Barban on 08/19/2019 16:37:04 [name], [name] ([id number]) -------------------------------------------------------------------------------- Multi Wound Chart Details Patient Name: [name] Date of Service: 08/19/2019 3:45 PM Medical Record Number: [id number] Patient Account Number: [id number] Date of Birth/Sex: [birth year] (85 y.o. M) Treating RN: Montey Hora Primary Care Lovada Barwick: Philis Fendt Other Clinician: Referring Calandra Madura: Philis Fendt Treating Arby Dahir/Extender: Tito Dine in Treatment: 2 Vital Signs Height(in): 71 Pulse(bpm): 61 Weight(lbs): 289 Blood Pressure(mmHg): 134/58 Body Mass Index(BMI): 40 Temperature(F): 97.7 Respiratory Rate 18 (breaths/min): Photos: [N/A:N/A] Wound Location: Right Lower Leg - Posterior N/A N/A Wounding Event: Gradually Appeared N/A N/A Primary Etiology: Lymphedema N/A N/A Comorbid History: Chronic  Obstructive N/A N/A Pulmonary Disease (COPD), Coronary Artery Disease, Deep Vein Thrombosis, Hypertension, Myocardial Infarction, Type II Diabetes, End Stage Renal Disease, Gout, Osteoarthritis, Neuropathy Date Acquired: 07/25/2019 N/A N/A Weeks of Treatment: 2 N/A N/A Wound Status: Open N/A N/A Measurements L x W x D 0.8x1x0.1 N/A N/A (cm) Area (cm) : 0.628 N/A N/A Volume (cm) : 0.063 N/A N/A % Reduction in Area: 55.60% N/A N/A % Reduction in Volume: 77.70% N/A N/A Classification: Full Thickness Without N/A N/A Exposed Support Structures Exudate Amount: Medium N/A N/A Exudate Type: Serous N/A N/A Exudate Color: amber N/A N/A Wound Margin: Flat and Intact N/A N/A Granulation Amount: Small (1-33%) N/A N/A Necrotic Amount: Large (67-100%) N/A N/A [name], [name] ([id number]) Exposed Structures: Fat Layer (Subcutaneous N/A N/A Tissue) Exposed: Yes Fascia: No Tendon: No Muscle: No Joint: No Bone:  No Epithelialization: None N/A N/A Procedures Performed: Compression Therapy N/A N/A Treatment Notes Wound #1 (Right, Posterior Lower Leg) Notes Silvercell, abd, 4 Layer, unna to ancor - Farrow wrap on [employer]) Signed: 08/19/2019 5:42:32 PM By: Linton Ham MD Entered By: Linton Ham on 08/19/2019 16:58:48 [name] ([id number]) -------------------------------------------------------------------------------- Multi-Disciplinary Care Plan Details Patient Name: [name] Date of Service: 08/19/2019 3:45 PM Medical Record Number: [id number] Patient Account Number: [id number] Date of Birth/Sex: Jan 25, 1932 (85 y.o. M) Treating RN: Montey Hora Primary Care Jonah Nestle: Philis Fendt Other Clinician: Referring Jaleil Renwick: Philis Fendt Treating Donnice Nielsen/Extender: Tito Dine in Treatment: 2 Active Inactive Necrotic Tissue Nursing Diagnoses: Impaired tissue integrity related to necrotic/devitalized tissue Goals: Necrotic/devitalized tissue will be minimized in the wound bed Date Initiated: 08/04/2019 Target Resolution Date: 08/01/2019 Goal Status: Active Interventions: Assess patient pain level pre-, during and post procedure and prior to discharge Treatment Activities: Apply topical anesthetic as ordered : 08/04/2019 Notes: Orientation to the Wound Care Program Nursing Diagnoses: Knowledge deficit related to the wound healing center program Goals: Patient/caregiver will verbalize understanding of the [place] Date Initiated: 08/04/2019 Target Resolution Date: 08/09/2019 Goal Status: Active Interventions: Provide education on orientation to the wound center Notes: Venous Leg Ulcer Nursing Diagnoses: Actual venous Insuffiency (use after diagnosis is confirmed) Goals: Patient will maintain optimal edema control Date Initiated: 08/04/2019 Target Resolution Date: 08/24/2019 Goal Status: Active [name],  [name] ([id number]) Interventions: Assess peripheral edema status every visit. Treatment Activities: Therapeutic compression applied : 08/04/2019 Notes: Electronic Signature(s) Signed: 08/19/2019 5:07:31 PM By: Montey Hora Entered By: Montey Hora on 08/19/2019 16:41:28 [name], [name] ([id number]) -------------------------------------------------------------------------------- Pain Assessment Details Patient Name: [name] Date of Service: 08/19/2019 3:45 PM Medical Record Number: [id number] Patient Account Number: [id number] Date of Birth/Sex: 1932/01/26 (85 y.o. M) Treating RN: Harold Barban Primary Care Avian Konigsberg: Philis Fendt Other Clinician: Referring Irini Leet: Philis Fendt Treating Jerrell Mangel/Extender: Tito Dine in Treatment: 2 Active Problems Location of Pain Severity and Description of Pain Patient Has Paino No Site Locations Pain Management and Medication Current Pain Management: Electronic Signature(s) Signed: 08/19/2019 4:47:34 PM By: Harold Barban Entered By: Harold Barban on 08/19/2019 16:20:49 [name] ([id number]) -------------------------------------------------------------------------------- Patient/Caregiver Education Details Patient Name: [name] Date of Service: 08/19/2019 3:45 PM Medical Record Number: [id number] Patient Account Number: [id number] Date of Birth/Gender: 1932-08-07 (85 y.o. M) Treating RN: Montey Hora Primary Care Physician: Philis Fendt Other Clinician: Referring Physician: Philis Fendt Treating Physician/Extender: Tito Dine in Treatment: 2 Education Assessment Education Provided To: Patient and Caregiver Education Topics Provided Venous: Handouts: Other: how to use velcro compression wraps Methods: Explain/Verbal Responses: State content correctly Electronic Signature(s) Signed: 08/19/2019  5:07:31 PM By: Montey Hora Entered By: Montey Hora on 08/19/2019  16:44:53 [name], [name] ([id number]) -------------------------------------------------------------------------------- Wound Assessment Details Patient Name: [name] Date of Service: 08/19/2019 3:45 PM Medical Record Number: [id number] Patient Account Number: [id number] Date of Birth/Sex: 1932-09-22 (85 y.o. M) Treating RN: Harold Barban Primary Care Eavan Gonterman: Philis Fendt Other Clinician: Referring Amiri Riechers: Philis Fendt Treating Gal Smolinski/Extender: Tito Dine in Treatment: 2 Wound Status Wound Number: 1 Primary Lymphedema Etiology: Wound Location: Right Lower Leg - Posterior Wound Open Wounding Event: Gradually Appeared Status: Date Acquired: 07/25/2019 Comorbid Chronic Obstructive Pulmonary Disease (COPD), Weeks Of Treatment: 2 History: Coronary Artery Disease, Deep Vein Thrombosis, Clustered Wound: No Hypertension, Myocardial Infarction, Type II Diabetes, End Stage Renal Disease, Gout, Osteoarthritis, Neuropathy Photos Wound Measurements Length: (cm) 0.8 Width: (cm) 1 Depth: (cm) 0.1 Area: (cm) 0.628 Volume: (cm) 0.063 % Reduction in Area: 55.6% % Reduction in Volume: 77.7% Epithelialization: None Tunneling: No Undermining: No Wound Description Full Thickness Without Exposed Support Classification: Structures Wound Margin: Flat and Intact Exudate Medium Amount: Exudate Type: Serous Exudate Color: amber Foul Odor After Cleansing: No Slough/Fibrino Yes Wound Bed Granulation Amount: Small (1-33%) Exposed Structure Necrotic Amount: Large (67-100%) Fascia Exposed: No Necrotic Quality: Adherent Slough Fat Layer (Subcutaneous Tissue) Exposed: Yes Tendon Exposed: No Muscle Exposed: No Joint Exposed: No [name], [name] ([id number]) Bone Exposed: No Treatment Notes Wound #1 (Right, Posterior Lower Leg) Notes Silvercell, abd, 4 Layer, unna to ancor - Farrow wrap on [employer]) Signed: 08/19/2019 4:47:34 PM By:  Harold Barban Entered By: Harold Barban on 08/19/2019 16:36:36 [name], [name] ([id number]) -------------------------------------------------------------------------------- Vitals Details Patient Name: [name] Date of Service: 08/19/2019 3:45 PM Medical Record Number: [id number] Patient Account Number: [id number] Date of Birth/Sex: [birth year] (85 y.o. M) Treating RN: Harold Barban Primary Care Ayari Liwanag: Philis Fendt Other Clinician: Referring Rhina Kramme: Philis Fendt Treating Olivine Hiers/Extender: Tito Dine in Treatment: 2 Vital Signs Time Taken: 16:20 Temperature (F): 97.7 Height (in): 71 Pulse (bpm): 61 Weight (lbs): 289 Respiratory Rate (breaths/min): 18 Body Mass Index (BMI): 40.3 Blood Pressure (mmHg): 134/58 Reference Range: 80 - 120 mg / dl Electronic Signature(s) Signed: 08/19/2019 4:47:34 PM By: Harold Barban Entered By: Harold Barban on 08/19/2019 16:23:32

## 2019-08-19 NOTE — Progress Notes (Signed)
[name], [name] ([id number]) Visit Report for 08/19/2019 HPI Details Patient Name: [name], [name] Date of Service: 08/19/2019 3:45 PM Medical Record Number: [id number] Patient Account Number: [id number] Date of Birth/Sex: 1931/11/15 (85 y.o. M) Treating RN: Montey Hora Primary Care Provider: Philis Fendt Other Clinician: Referring Provider: Philis Fendt Treating Provider/Extender: Tito Dine in Treatment: 2 History of Present Illness HPI Description: 08/04/2019 on evaluation today patient presents for initial evaluation here in our clinic concerning issues that he has been having with lymphedema for quite some time. He has intermittent issues with blistering of his lower extremities for which she will go to the wound center and typically they will wrap him and then subsequently this gets better. That is been an ongoing issue for quite some time. Currently a couple weeks ago he had blisters pop up on the left leg and the right leg currently there is a small spot open on the right but nothing on the left. He does have a history of coronary artery disease, COPD, diabetes mellitus type 2, and hypertension. Fortunately there is no signs of any cellulitis or active infection at this time. He has never had any lymphedema pumps and in fact did not even know what I was talking about when I mention this to him although he has been wrapped and had compression recommended for him long enough he may qualify for going ahead and initiating therapy with lymphedema pumps obviously this would have to be insurance approved. Fortunately there is no signs of any systemic infection either. No fevers, chills, nausea, vomiting, or diarrhea. He does have stage III lymphedema. 08/12/2019 on evaluation today patient appears to be doing well with regard to his lower extremities the edema is much better than it was during the last evaluation. Fortunately there is no signs of active infection which  is great news. No fevers, chills, nausea, vomiting, or diarrhea. Overall he has been tolerating the wraps without any complication and the wound seems to be doing better as well. We are still working on gathering all the information for ordering the lymphedema pumps at this point. He does need a plan for ongoing compression as well. 10/30; the patient seems to be making nice progress. He only has a small open wound remaining on the right lateral calf. Everything on the left is healed. He has his bilateral external compression wraps. Electronic Signature(s) Signed: 08/19/2019 5:42:32 PM By: Linton Ham MD Entered By: Linton Ham on 08/19/2019 16:59:53 [name], [name] ([id number]) -------------------------------------------------------------------------------- Physical Exam Details Patient Name: [name] Date of Service: 08/19/2019 3:45 PM Medical Record Number: [id number] Patient Account Number: [id number] Date of Birth/Sex: 1932/04/17 (85 y.o. M) Treating RN: Montey Hora Primary Care Provider: Philis Fendt Other Clinician: Referring Provider: Philis Fendt Treating Provider/Extender: Tito Dine in Treatment: 2 Constitutional Sitting or standing Blood Pressure is within target range for patient.. Pulse regular and within target range for patient.Marland Kitchen Respirations regular, non-labored and within target range.. Temperature is normal and within the target range for the patient.Marland Kitchen appears in no distress. Respiratory Respiratory effort is easy and symmetric bilaterally. Rate is normal at rest and on room air.. Cardiovascular Pedal pulses palpable and strong bilaterally.. Integumentary (Hair, Skin) Changes of lymphedema chronic venous insufficiency. Psychiatric No evidence of depression, anxiety, or agitation. Calm, cooperative, and communicative. Appropriate interactions and affect.. Notes Wound exam; patient with significant lymphedema. Almost a pantaloon  deformity above his ankles. His remaining wound is on the right lateral calf. There is nothing on the left Electronic  Signature(s) Signed: 08/19/2019 5:42:32 PM By: Linton Ham MD Entered By: Linton Ham on 08/19/2019 17:01:15 [name], [name] ([id number]) -------------------------------------------------------------------------------- Physician Orders Details Patient Name: [name] Date of Service: 08/19/2019 3:45 PM Medical Record Number: [id number] Patient Account Number: [id number] Date of Birth/Sex: 1932/03/08 (85 y.o. M) Treating RN: Montey Hora Primary Care Provider: Philis Fendt Other Clinician: Referring Provider: Philis Fendt Treating Provider/Extender: Tito Dine in Treatment: 2 Verbal / Phone Orders: No Diagnosis Coding Wound Cleansing Wound #1 Right,Posterior Lower Leg o May shower with protection. - Please do not get your wrap wet Anesthetic (add to Medication List) Wound #1 Right,Posterior Lower Leg o Topical Lidocaine 4% cream applied to wound bed prior to debridement (In Clinic Only). Primary Wound Dressing Wound #1 Right,Posterior Lower Leg o Silver Alginate Secondary Dressing Wound #1 Right,Posterior Lower Leg o ABD pad Dressing Change Frequency Wound #1 Right,Posterior Lower Leg o Change dressing every week o Other: - nurse visit if needed Follow-up Appointments Wound #1 Right,Posterior Lower Leg o Return Appointment in 1 week. o Nurse Visit as needed Edema Control Wound #1 Right,Posterior Lower Leg o 4-Layer Compression System - Right Lower Extremity - anchor with unna o Patient to wear own Velcro compression garment. - on left lower leg o Elevate legs to the level of the heart and pump ankles as often as possible Home Health Wound #1 [place] Visits - [place] [phone]) o Home Health Nurse may visit PRN to address patientos wound  care needs. o FACE TO FACE ENCOUNTER: MEDICARE and MEDICAID PATIENTS: I certify that this patient is under my care and that I had a face-to-face encounter that meets the physician face-to-face encounter requirements with this patient on this date. The encounter with the patient was in whole or in part for the following MEDICAL CONDITION: (primary reason for [place]) MEDICAL NECESSITY: I certify, that based on my findings, NURSING services are a medically necessary home health service. HOME BOUND STATUS: I certify that my clinical findings support that this patient is homebound (i.e., Due to illness or injury, pt requires aid of JACOBI, NILE ([id number]) supportive devices such as crutches, cane, wheelchairs, walkers, the use of special transportation or the assistance of another person to leave their place of residence. There is a normal inability to leave the home and doing so requires considerable and taxing effort. Other absences are for medical reasons / religious services and are infrequent or of short duration when for other reasons). o If current dressing causes regression in wound condition, may D/C ordered dressing product/s and apply Normal Saline Moist Dressing daily until next [place] / Other MD appointment. [place] of regression in wound condition at [phone]. o Please direct any NON-WOUND related issues/requests for orders to patient's Primary Care Physician Electronic Signature(s) Signed: 08/19/2019 5:07:31 PM By: Montey Hora Signed: 08/19/2019 5:42:32 PM By: Linton Ham MD Entered By: Montey Hora on 08/19/2019 16:44:17 [name], [name] ([id number]) -------------------------------------------------------------------------------- Problem List Details Patient Name: [name] Date of Service: 08/19/2019 3:45 PM Medical Record Number: [id number] Patient Account Number: [id number] Date of Birth/Sex: Jul 13, 1932 (85  y.o. M) Treating RN: Montey Hora Primary Care Provider: Philis Fendt Other Clinician: Referring Provider: Philis Fendt Treating Provider/Extender: Tito Dine in Treatment: 2 Active Problems ICD-10 Evaluated Encounter Code Description Active Date Today Diagnosis I89.0 Lymphedema, not elsewhere classified 08/04/2019 No Yes L97.812 Non-pressure chronic ulcer of other part of right lower leg 08/04/2019 No Yes with  fat layer exposed B35.4 Tinea corporis 08/04/2019 No Yes I25.10 Atherosclerotic heart disease of native coronary artery 08/04/2019 No Yes without angina pectoris J44.9 Chronic obstructive pulmonary disease, unspecified 08/04/2019 No Yes E11.628 Type 2 diabetes mellitus with other skin complications 32/20/2542 No Yes I10 Essential (primary) hypertension 08/04/2019 No Yes Inactive Problems Resolved Problems Electronic Signature(s) Signed: 08/19/2019 5:42:32 PM By: Linton Ham MD Entered By: Linton Ham on 08/19/2019 16:58:40 [name], [name] ([id number]) -------------------------------------------------------------------------------- Progress Note Details Patient Name: [name] Date of Service: 08/19/2019 3:45 PM Medical Record Number: [id number] Patient Account Number: [id number] Date of Birth/Sex: 18-May-1932 (85 y.o. M) Treating RN: Montey Hora Primary Care Provider: Philis Fendt Other Clinician: Referring Provider: Philis Fendt Treating Provider/Extender: Tito Dine in Treatment: 2 Subjective History of Present Illness (HPI) 08/04/2019 on evaluation today patient presents for initial evaluation here in our clinic concerning issues that he has been having with lymphedema for quite some time. He has intermittent issues with blistering of his lower extremities for which she will go to the wound center and typically they will wrap him and then subsequently this gets better. That is been an ongoing issue for quite some  time. Currently a couple weeks ago he had blisters pop up on the left leg and the right leg currently there is a small spot open on the right but nothing on the left. He does have a history of coronary artery disease, COPD, diabetes mellitus type 2, and hypertension. Fortunately there is no signs of any cellulitis or active infection at this time. He has never had any lymphedema pumps and in fact did not even know what I was talking about when I mention this to him although he has been wrapped and had compression recommended for him long enough he may qualify for going ahead and initiating therapy with lymphedema pumps obviously this would have to be insurance approved. Fortunately there is no signs of any systemic infection either. No fevers, chills, nausea, vomiting, or diarrhea. He does have stage III lymphedema. 08/12/2019 on evaluation today patient appears to be doing well with regard to his lower extremities the edema is much better than it was during the last evaluation. Fortunately there is no signs of active infection which is great news. No fevers, chills, nausea, vomiting, or diarrhea. Overall he has been tolerating the wraps without any complication and the wound seems to be doing better as well. We are still working on gathering all the information for ordering the lymphedema pumps at this point. He does need a plan for ongoing compression as well. 10/30; the patient seems to be making nice progress. He only has a small open wound remaining on the right lateral calf. Everything on the left is healed. He has his bilateral external compression wraps. Objective Constitutional Sitting or standing Blood Pressure is within target range for patient.. Pulse regular and within target range for patient.Marland Kitchen Respirations regular, non-labored and within target range.. Temperature is normal and within the target range for the patient.Marland Kitchen appears in no distress. Vitals Time Taken: 4:20 PM, Height: 71  in, Weight: 289 lbs, BMI: 40.3, Temperature: 97.7 F, Pulse: 61 bpm, Respiratory Rate: 18 breaths/min, Blood Pressure: 134/58 mmHg. Respiratory Respiratory effort is easy and symmetric bilaterally. Rate is normal at rest and on room air.. Cardiovascular Pedal pulses palpable and strong bilaterally.Marland Kitchen Psychiatric No evidence of depression, anxiety, or agitation. Calm, cooperative, and communicative. Appropriate interactions and affect.[name], [name] ([id number]) General Notes: Wound exam; patient with significant lymphedema. Almost a pantaloon  deformity above his ankles. His remaining wound is on the right lateral calf. There is nothing on the left Integumentary (Hair, Skin) Changes of lymphedema chronic venous insufficiency. Wound #1 status is Open. Original cause of wound was Gradually Appeared. The wound is located on the Right,Posterior Lower Leg. The wound measures 0.8cm length x 1cm width x 0.1cm depth; 0.628cm^2 area and 0.063cm^3 volume. There is Fat Layer (Subcutaneous Tissue) Exposed exposed. There is no tunneling or undermining noted. There is a medium amount of serous drainage noted. The wound margin is flat and intact. There is small (1-33%) granulation within the wound bed. There is a large (67-100%) amount of necrotic tissue within the wound bed including Adherent Slough. Assessment Active Problems ICD-10 Lymphedema, not elsewhere classified Non-pressure chronic ulcer of other part of right lower leg with fat layer exposed Tinea corporis Atherosclerotic heart disease of native coronary artery without angina pectoris Chronic obstructive pulmonary disease, unspecified Type 2 diabetes mellitus with other skin complications Essential (primary) hypertension Procedures Wound #1 Pre-procedure diagnosis of Wound #1 is a Lymphedema located on the Right,Posterior Lower Leg . There was a Four Layer Compression Therapy Procedure with a pre-treatment ABI of 1.1 by Montey Hora,  RN. Post procedure Diagnosis Wound #1: Same as Pre-Procedure Plan Wound Cleansing: Wound #1 Right,Posterior Lower Leg: May shower with protection. - Please do not get your wrap wet Anesthetic (add to Medication List): Wound #1 Right,Posterior Lower Leg: Topical Lidocaine 4% cream applied to wound bed prior to debridement (In Clinic Only). Primary Wound Dressing: Wound #1 Right,Posterior Lower Leg: Silver Alginate [name], [name] ([id number]) Secondary Dressing: Wound #1 Right,Posterior Lower Leg: ABD pad Dressing Change Frequency: Wound #1 Right,Posterior Lower Leg: Change dressing every week Other: - nurse visit if needed Follow-up Appointments: Wound #1 Right,Posterior Lower Leg: Return Appointment in 1 week. Nurse Visit as needed Edema Control: Wound #1 Right,Posterior Lower Leg: 4-Layer Compression System - Right Lower Extremity - anchor with unna Patient to wear own Velcro compression garment. - on left lower leg Elevate legs to the level of the heart and pump ankles as often as possible Home Health: Wound #1 Right,Posterior Lower Leg: [place] Visits - [place] [phone]) Home Health Nurse may visit PRN to address patient s wound care needs. FACE TO FACE ENCOUNTER: MEDICARE and MEDICAID PATIENTS: I certify that this patient is under my care and that I had a face-to-face encounter that meets the physician face-to-face encounter requirements with this patient on this date. The encounter with the patient was in whole or in part for the following MEDICAL CONDITION: (primary reason for [place]) MEDICAL NECESSITY: I certify, that based on my findings, NURSING services are a medically necessary home health service. HOME BOUND STATUS: I certify that my clinical findings support that this patient is homebound (i.e., Due to illness or injury, pt requires aid of supportive devices such as crutches, cane, wheelchairs, walkers, the use of  special transportation or the assistance of another person to leave their place of residence. There is a normal inability to leave the home and doing so requires considerable and taxing effort. Other absences are for medical reasons / religious services and are infrequent or of short duration when for other reasons). If current dressing causes regression in wound condition, may D/C ordered dressing product/s and apply Normal Saline Moist Dressing daily until next [place] / Other MD appointment. [place] of regression in wound condition at [phone]. Please direct any NON-WOUND related  issues/requests for orders to patient's Primary Care Physician 1. Silver alginate under 4-layer compression on the right Able to transition him into his home external compression garment on the left. Electronic Signature(s) Signed: 08/19/2019 5:42:32 PM By: Linton Ham MD Entered By: Linton Ham on 08/19/2019 17:02:00 [name], [name] ([id number]) -------------------------------------------------------------------------------- SuperBill Details Patient Name: [name] Date of Service: 08/19/2019 Medical Record Number: [id number] Patient Account Number: [id number] Date of Birth/Sex: 1932-10-02 (85 y.o. M) Treating RN: Montey Hora Primary Care Provider: Philis Fendt Other Clinician: Referring Provider: Philis Fendt Treating Provider/Extender: Tito Dine in Treatment: 2 Diagnosis Coding ICD-10 Codes Code Description I89.0 Lymphedema, not elsewhere classified L97.812 Non-pressure chronic ulcer of other part of right lower leg with fat layer exposed B35.4 Tinea corporis I25.10 Atherosclerotic heart disease of native coronary artery without angina pectoris J44.9 Chronic obstructive pulmonary disease, unspecified E11.628 Type 2 diabetes mellitus with other skin complications G01 Essential (primary) hypertension Facility Procedures CPT4 Code  Description: [id number] (Facility Use Only) [phone] - [facility] LWR RT LEG Modifier: Quantity: 1 Physician Procedures CPT4 Code Description: [id number] - WC PHYS LEVEL 3 - EST PT ICD-10 Diagnosis Description I89.0 Lymphedema, not elsewhere classified L97.812 Non-pressure chronic ulcer of other part of right lower leg w Modifier: ith fat layer expo Quantity: 1 sed Electronic Signature(s) Signed: 08/19/2019 5:42:32 PM By: Linton Ham MD Entered By: Linton Ham on 08/19/2019 17:02:32

## 2019-08-22 ENCOUNTER — Ambulatory Visit: Payer: Medicare Other | Admitting: Physical Therapy

## 2019-08-25 ENCOUNTER — Encounter: Payer: Medicare Other | Admitting: Physical Therapy

## 2019-08-29 ENCOUNTER — Encounter: Payer: Medicare Other | Admitting: Physical Therapy

## 2019-08-30 ENCOUNTER — Other Ambulatory Visit: Payer: Self-pay

## 2019-08-30 ENCOUNTER — Encounter: Payer: Medicare Other | Attending: Physician Assistant | Admitting: Physician Assistant

## 2019-08-30 DIAGNOSIS — L97822 Non-pressure chronic ulcer of other part of left lower leg with fat layer exposed: Secondary | ICD-10-CM | POA: Insufficient documentation

## 2019-08-30 DIAGNOSIS — J449 Chronic obstructive pulmonary disease, unspecified: Secondary | ICD-10-CM | POA: Diagnosis not present

## 2019-08-30 DIAGNOSIS — Z87891 Personal history of nicotine dependence: Secondary | ICD-10-CM | POA: Insufficient documentation

## 2019-08-30 DIAGNOSIS — I252 Old myocardial infarction: Secondary | ICD-10-CM | POA: Diagnosis not present

## 2019-08-30 DIAGNOSIS — B354 Tinea corporis: Secondary | ICD-10-CM | POA: Insufficient documentation

## 2019-08-30 DIAGNOSIS — Z86718 Personal history of other venous thrombosis and embolism: Secondary | ICD-10-CM | POA: Insufficient documentation

## 2019-08-30 DIAGNOSIS — I878 Other specified disorders of veins: Secondary | ICD-10-CM | POA: Insufficient documentation

## 2019-08-30 DIAGNOSIS — I10 Essential (primary) hypertension: Secondary | ICD-10-CM | POA: Diagnosis not present

## 2019-08-30 DIAGNOSIS — M1990 Unspecified osteoarthritis, unspecified site: Secondary | ICD-10-CM | POA: Diagnosis not present

## 2019-08-30 DIAGNOSIS — E785 Hyperlipidemia, unspecified: Secondary | ICD-10-CM | POA: Diagnosis not present

## 2019-08-30 DIAGNOSIS — E11628 Type 2 diabetes mellitus with other skin complications: Secondary | ICD-10-CM | POA: Diagnosis not present

## 2019-08-30 DIAGNOSIS — L97812 Non-pressure chronic ulcer of other part of right lower leg with fat layer exposed: Secondary | ICD-10-CM | POA: Insufficient documentation

## 2019-08-30 DIAGNOSIS — I2510 Atherosclerotic heart disease of native coronary artery without angina pectoris: Secondary | ICD-10-CM | POA: Insufficient documentation

## 2019-08-30 DIAGNOSIS — I890 Lymphedema, not elsewhere classified: Secondary | ICD-10-CM | POA: Diagnosis present

## 2019-08-30 DIAGNOSIS — E1122 Type 2 diabetes mellitus with diabetic chronic kidney disease: Secondary | ICD-10-CM | POA: Insufficient documentation

## 2019-08-30 NOTE — Progress Notes (Addendum)
WEILAND, TOMICH ([id number]) Visit Report for 08/30/2019 Arrival Information Details Patient Name: [name], [name] Date of Service: 08/30/2019 1:45 PM Medical Record Number: [id number] Patient Account Number: [id number] Date of Birth/Sex: 05/31/1932 (85 y.o. M) Treating RN: Montey Hora Primary Care Yaritzy Huser: Philis Fendt Other Clinician: Referring Rise Traeger: Philis Fendt Treating Aitana Burry/Extender: Melburn Hake, HOYT Weeks in Treatment: 3 Visit Information History Since Last Visit Added or deleted any medications: No Patient Arrived: Ambulatory Any new allergies or adverse reactions: No Arrival Time: 13:55 Had a fall or experienced change in No Accompanied By: friend activities of daily living that may affect Transfer Assistance: None risk of falls: Patient Identification Verified: Yes Signs or symptoms of abuse/neglect since last visito No Secondary Verification Process Completed: Yes Has Dressing in Place as Prescribed: Yes Pain Present Now: No Electronic Signature(s) Signed: 08/30/2019 4:25:36 PM By: Lorine Bears RCP, RRT, CHT Entered By: Lorine Bears on 08/30/2019 13:57:15 [name] ([id number]) -------------------------------------------------------------------------------- Clinic Level of Care Assessment Details Patient Name: [name] Date of Service: 08/30/2019 1:45 PM Medical Record Number: [id number] Patient Account Number: [id number] Date of Birth/Sex: 13-Sep-1932 (85 y.o. M) Treating RN: Montey Hora Primary Care Velvia Mehrer: Philis Fendt Other Clinician: Referring Blanchard Willhite: Philis Fendt Treating Shallon Yaklin/Extender: Melburn Hake, HOYT Weeks in Treatment: 3 Clinic Level of Care Assessment Items TOOL 4 Quantity Score []  - Use when only an EandM is performed on FOLLOW-UP visit 0 ASSESSMENTS - Nursing Assessment / Reassessment X - Reassessment of Co-morbidities (includes updates in patient status) 1 10 X- 1  5 Reassessment of Adherence to Treatment Plan ASSESSMENTS - Wound and Skin Assessment / Reassessment X - Simple Wound Assessment / Reassessment - one wound 1 5 []  - 0 Complex Wound Assessment / Reassessment - multiple wounds X- 1 10 Dermatologic / Skin Assessment (not related to wound area) ASSESSMENTS - Focused Assessment X - Circumferential Edema Measurements - multi extremities 1 5 []  - 0 Nutritional Assessment / Counseling / Intervention X- 1 5 Lower Extremity Assessment (monofilament, tuning fork, pulses) []  - 0 Peripheral Arterial Disease Assessment (using hand held doppler) ASSESSMENTS - Ostomy and/or Continence Assessment and Care []  - Incontinence Assessment and Management 0 []  - 0 Ostomy Care Assessment and Management (repouching, etc.) PROCESS - Coordination of Care X - Simple Patient / Family Education for ongoing care 1 15 []  - 0 Complex (extensive) Patient / Family Education for ongoing care X- 1 10 Staff obtains Programmer, systems, Records, Test Results / Process Orders []  - 0 Staff telephones HHA, Nursing Homes / Clarify orders / etc []  - 0 Routine Transfer to another Facility (non-emergent condition) []  - 0 Routine Hospital Admission (non-emergent condition) []  - 0 New Admissions / Biomedical engineer / Ordering NPWT, Apligraf, etc. []  - 0 Emergency Hospital Admission (emergent condition) X- 1 10 Simple Discharge Coordination Viviano, Torion ([id number]) []  - 0 Complex (extensive) Discharge Coordination PROCESS - Special Needs []  - Pediatric / Minor Patient Management 0 []  - 0 Isolation Patient Management []  - 0 Hearing / Language / Visual special needs []  - 0 Assessment of Community assistance (transportation, D/C planning, etc.) []  - 0 Additional assistance / Altered mentation []  - 0 Support Surface(s) Assessment (bed, cushion, seat, etc.) INTERVENTIONS - Wound Cleansing / Measurement X - Simple Wound Cleansing - one wound 1 5 []  - 0 Complex Wound  Cleansing - multiple wounds X- 1 5 Wound Imaging (photographs - any number of wounds) []  - 0 Wound Tracing (instead of photographs) X- 1 5 Simple Wound Measurement - one wound []  -  0 Complex Wound Measurement - multiple wounds INTERVENTIONS - Wound Dressings X - Small Wound Dressing one or multiple wounds 1 10 []  - 0 Medium Wound Dressing one or multiple wounds []  - 0 Large Wound Dressing one or multiple wounds []  - 0 Application of Medications - topical []  - 0 Application of Medications - injection INTERVENTIONS - Miscellaneous []  - External ear exam 0 []  - 0 Specimen Collection (cultures, biopsies, blood, body fluids, etc.) []  - 0 Specimen(s) / Culture(s) sent or taken to Lab for analysis []  - 0 Patient Transfer (multiple staff / Civil Service fast streamer / Similar devices) []  - 0 Simple Staple / Suture removal (25 or less) []  - 0 Complex Staple / Suture removal (26 or more) []  - 0 Hypo / Hyperglycemic Management (close monitor of Blood Glucose) []  - 0 Ankle / Brachial Index (ABI) - do not check if billed separately X- 1 5 Vital Signs Kaatz, Denzil ([id number]) Has the patient been seen at the hospital within the last three years: Yes Total Score: 105 Level Of Care: New/Established - Level 3 Electronic Signature(s) Signed: 08/30/2019 5:03:01 PM By: Montey Hora Entered By: Montey Hora on 08/30/2019 15:20:11 [name] ([id number]) -------------------------------------------------------------------------------- Encounter Discharge Information Details Patient Name: [name] Date of Service: 08/30/2019 1:45 PM Medical Record Number: [id number] Patient Account Number: [id number] Date of Birth/Sex: 07-09-1932 (85 y.o. M) Treating RN: Montey Hora Primary Care Zanobia Griebel: Philis Fendt Other Clinician: Referring Cresta Riden: Philis Fendt Treating Maliyah Willets/Extender: Melburn Hake, HOYT Weeks in Treatment: 3 Encounter Discharge Information Items Discharge Condition:  Stable Ambulatory Status: Ambulatory Discharge Destination: Home Transportation: Private Auto Accompanied By: daughter Schedule Follow-up Appointment: Yes Clinical Summary of Care: Electronic Signature(s) Signed: 08/30/2019 5:03:01 PM By: Montey Hora Entered By: Montey Hora on 08/30/2019 15:21:09 [name] ([id number]) -------------------------------------------------------------------------------- Lower Extremity Assessment Details Patient Name: [name] Date of Service: 08/30/2019 1:45 PM Medical Record Number: [id number] Patient Account Number: [id number] Date of Birth/Sex: 18-Sep-1932 (85 y.o. M) Treating RN: Harold Barban Primary Care Sheree Lalla: Philis Fendt Other Clinician: Referring Eliyanna Ault: Philis Fendt Treating Trisha Morandi/Extender: Melburn Hake, HOYT Weeks in Treatment: 3 Edema Assessment Assessed: [Left: No] [Right: No] E[Left: dema] [Right: :] Calf Left: Right: Point of Measurement: 37 cm From Medial Instep cm 44.3 cm Ankle Left: Right: Point of Measurement: 10 cm From Medial Instep cm 30 cm Vascular Assessment Pulses: Dorsalis Pedis Palpable: [Right:Yes] Posterior Tibial Palpable: [Right:Yes] Electronic Signature(s) Signed: 08/30/2019 3:05:47 PM By: Harold Barban Entered By: Harold Barban on 08/30/2019 14:10:36 Prochazka, Jodi Mourning ([id number]) -------------------------------------------------------------------------------- Multi Wound Chart Details Patient Name: [name] Date of Service: 08/30/2019 1:45 PM Medical Record Number: [id number] Patient Account Number: [id number] Date of Birth/Sex: 1932/02/10 (85 y.o. M) Treating RN: Montey Hora Primary Care Antasia Haider: Philis Fendt Other Clinician: Referring Adiba Fargnoli: Philis Fendt Treating Hakim Minniefield/Extender: Melburn Hake, HOYT Weeks in Treatment: 3 Vital Signs Height(in): 71 Pulse(bpm): 63 Weight(lbs): 289 Blood Pressure(mmHg): 151/58 Body Mass Index(BMI):  40 Temperature(F): 98.2 Respiratory Rate 18 (breaths/min): Photos: [N/A:N/A] Wound Location: Right, Posterior Lower Leg N/A N/A Wounding Event: Gradually Appeared N/A N/A Primary Etiology: Lymphedema N/A N/A Comorbid History: Chronic Obstructive N/A N/A Pulmonary Disease (COPD), Coronary Artery Disease, Deep Vein Thrombosis, Hypertension, Myocardial Infarction, Type II Diabetes, End Stage Renal Disease, Gout, Osteoarthritis, Neuropathy Date Acquired: 07/25/2019 N/A N/A Weeks of Treatment: 3 N/A N/A Wound Status: Healed - Epithelialized N/A N/A Measurements L x W x D 0x0x0 N/A N/A (cm) Area (cm) : 0 N/A N/A Volume (cm) : 0 N/A N/A % Reduction in Area: 100.00% N/A N/A %  Reduction in Volume: 100.00% N/A N/A Classification: Full Thickness Without N/A N/A Exposed Support Structures Exudate Amount: None Present N/A N/A Wound Margin: Flat and Intact N/A N/A Granulation Amount: Small (1-33%) N/A N/A Necrotic Amount: Large (67-100%) N/A N/A Exposed Structures: Fascia: No N/A N/A Fat Layer (Subcutaneous Crite, Gaspar ([id number]) Tissue) Exposed: No Tendon: No Muscle: No Joint: No Bone: No Epithelialization: Large (67-100%) N/A N/A Treatment Notes Electronic Signature(s) Signed: 08/30/2019 5:03:01 PM By: Montey Hora Entered By: Montey Hora on 08/30/2019 14:20:07 [name] ([id number]) -------------------------------------------------------------------------------- Multi-Disciplinary Care Plan Details Patient Name: [name] Date of Service: 08/30/2019 1:45 PM Medical Record Number: [id number] Patient Account Number: [id number] Date of Birth/Sex: April 27, 1932 (85 y.o. M) Treating RN: Montey Hora Primary Care Sadiya Durand: Philis Fendt Other Clinician: Referring Codee Tutson: Philis Fendt Treating Myalee Stengel/Extender: Melburn Hake, HOYT Weeks in Treatment: 3 Active Inactive Necrotic Tissue Nursing Diagnoses: Impaired tissue integrity related to  necrotic/devitalized tissue Goals: Necrotic/devitalized tissue will be minimized in the wound bed Date Initiated: 08/04/2019 Target Resolution Date: 08/01/2019 Goal Status: Active Interventions: Assess patient pain level pre-, during and post procedure and prior to discharge Treatment Activities: Apply topical anesthetic as ordered : 08/04/2019 Notes: Orientation to the Wound Care Program Nursing Diagnoses: Knowledge deficit related to the wound healing center program Goals: Patient/caregiver will verbalize understanding of the [place] Date Initiated: 08/04/2019 Target Resolution Date: 08/09/2019 Goal Status: Active Interventions: Provide education on orientation to the wound center Notes: Venous Leg Ulcer Nursing Diagnoses: Actual venous Insuffiency (use after diagnosis is confirmed) Goals: Patient will maintain optimal edema control Date Initiated: 08/04/2019 Target Resolution Date: 08/24/2019 Goal Status: Active Kosel, Duel ([id number]) Interventions: Assess peripheral edema status every visit. Treatment Activities: Therapeutic compression applied : 08/04/2019 Notes: Electronic Signature(s) Signed: 08/30/2019 5:03:01 PM By: Montey Hora Entered By: Montey Hora on 08/30/2019 14:19:58 Boden, Jodi Mourning ([id number]) -------------------------------------------------------------------------------- Pain Assessment Details Patient Name: [name] Date of Service: 08/30/2019 1:45 PM Medical Record Number: [id number] Patient Account Number: [id number] Date of Birth/Sex: 04-30-1932 (85 y.o. M) Treating RN: Montey Hora Primary Care Wilho Sharpley: Philis Fendt Other Clinician: Referring Alphonza Tramell: Philis Fendt Treating Nazar Kuan/Extender: Melburn Hake, HOYT Weeks in Treatment: 3 Active Problems Location of Pain Severity and Description of Pain Patient Has Paino No Site Locations Pain Management and Medication Current Pain  Management: Electronic Signature(s) Signed: 08/30/2019 4:25:36 PM By: Paulla Fore, RRT, CHT Signed: 08/30/2019 5:03:01 PM By: Montey Hora Entered By: Lorine Bears on 08/30/2019 13:57:26 [name] ([id number]) -------------------------------------------------------------------------------- Patient/Caregiver Education Details Patient Name: [name] Date of Service: 08/30/2019 1:45 PM Medical Record Number: [id number] Patient Account Number: [id number] Date of Birth/Gender: 09/06/1932 (85 y.o. M) Treating RN: Montey Hora Primary Care Physician: Philis Fendt Other Clinician: Referring Physician: Philis Fendt Treating Physician/Extender: Sharalyn Ink in Treatment: 3 Education Assessment Education Provided To: Patient and Caregiver Education Topics Provided Venous: Handouts: Other: applying velcro compression wraps Methods: Demonstration, Explain/Verbal Responses: State content correctly Electronic Signature(s) Signed: 08/30/2019 5:03:01 PM By: Montey Hora Entered By: Montey Hora on 08/30/2019 15:20:37 Galiano, Jodi Mourning ([id number]) -------------------------------------------------------------------------------- Wound Assessment Details Patient Name: [name] Date of Service: 08/30/2019 1:45 PM Medical Record Number: [id number] Patient Account Number: [id number] Date of Birth/Sex: April 05, 1932 (85 y.o. M) Treating RN: Montey Hora Primary Care Menucha Dicesare: Philis Fendt Other Clinician: Referring Xaria Judon: Philis Fendt Treating Lauriana Denes/Extender: Melburn Hake, HOYT Weeks in Treatment: 3 Wound Status Wound Number: 1 Primary Lymphedema Etiology: Wound Location: Right, Posterior Lower Leg Wound Healed - Epithelialized Wounding Event: Gradually Appeared Status: Date Acquired: 07/25/2019  Comorbid Chronic Obstructive Pulmonary Disease (COPD), Weeks Of Treatment: 3 History: Coronary Artery Disease, Deep  Vein Thrombosis, Clustered Wound: No Hypertension, Myocardial Infarction, Type II Diabetes, End Stage Renal Disease, Gout, Osteoarthritis, Neuropathy Photos Wound Measurements Length: (cm) 0 % Reductio Width: (cm) 0 % Reductio Depth: (cm) 0 Epithelial Area: (cm) 0 Tunneling Volume: (cm) 0 Undermini n in Area: 100% n in Volume: 100% ization: Large (67-100%) : No ng: No Wound Description Full Thickness Without Exposed Support Foul Odor Classification: Structures Slough/Fib Wound Margin: Flat and Intact Exudate None Present Amount: After Cleansing: No rino Yes Wound Bed Granulation Amount: Small (1-33%) Exposed Structure Necrotic Amount: Large (67-100%) Fascia Exposed: No Necrotic Quality: Adherent Slough Fat Layer (Subcutaneous Tissue) Exposed: No Tendon Exposed: No Muscle Exposed: No Joint Exposed: No Bone Exposed: No MOODIE, Ameer ([id number]) Electronic Signature(s) Signed: 08/30/2019 5:03:01 PM By: Montey Hora Entered By: Montey Hora on 08/30/2019 14:17:59 Nicole, Jodi Mourning ([id number]) -------------------------------------------------------------------------------- Vitals Details Patient Name: [name] Date of Service: 08/30/2019 1:45 PM Medical Record Number: [id number] Patient Account Number: [id number] Date of Birth/Sex: May 19, 1932 (85 y.o. M) Treating RN: Montey Hora Primary Care Tashya Alberty: Philis Fendt Other Clinician: Referring Dawson Hollman: Philis Fendt Treating Collyn Ribas/Extender: Melburn Hake, HOYT Weeks in Treatment: 3 Vital Signs Time Taken: 13:55 Temperature (F): 98.2 Height (in): 71 Pulse (bpm): 67 Weight (lbs): 289 Respiratory Rate (breaths/min): 18 Body Mass Index (BMI): 40.3 Blood Pressure (mmHg): 151/58 Reference Range: 80 - 120 mg / dl Electronic Signature(s) Signed: 08/30/2019 4:25:36 PM By: Lorine Bears RCP, RRT, CHT Entered By: Lorine Bears on 08/30/2019 13:57:52

## 2019-08-30 NOTE — Progress Notes (Addendum)
[name], [name] ([id number]) Visit Report for 08/30/2019 Chief Complaint Document Details Patient Name: [name], [name] Date of Service: 08/30/2019 1:45 PM Medical Record Number: [id number] Patient Account Number: [id number] Date of Birth/Sex: Apr 01, 1932 (85 y.o. M) Treating RN: Montey Hora Primary Care Provider: Philis Fendt Other Clinician: Referring Provider: Philis Fendt Treating Provider/Extender: Melburn Hake, HOYT Weeks in Treatment: 3 Information Obtained from: Patient Chief Complaint Bilateral LE Lymphedema with right leg ulcer Electronic Signature(s) Signed: 08/30/2019 1:50:55 PM By: Worthy Keeler PA-C Entered By: Worthy Keeler on 08/30/2019 13:50:54 [name], [name] ([id number]) -------------------------------------------------------------------------------- HPI Details Patient Name: [name] Date of Service: 08/30/2019 1:45 PM Medical Record Number: [id number] Patient Account Number: [id number] Date of Birth/Sex: 06/09/1932 (85 y.o. M) Treating RN: Montey Hora Primary Care Provider: Philis Fendt Other Clinician: Referring Provider: Philis Fendt Treating Provider/Extender: Melburn Hake, HOYT Weeks in Treatment: 3 History of Present Illness HPI Description: 08/04/2019 on evaluation today patient presents for initial evaluation here in our clinic concerning issues that he has been having with lymphedema for quite some time. He has intermittent issues with blistering of his lower extremities for which she will go to the wound center and typically they will wrap him and then subsequently this gets better. That is been an ongoing issue for quite some time. Currently a couple weeks ago he had blisters pop up on the left leg and the right leg currently there is a small spot open on the right but nothing on the left. He does have a history of coronary artery disease, COPD, diabetes mellitus type 2, and hypertension. Fortunately there is no signs of any  cellulitis or active infection at this time. He has never had any lymphedema pumps and in fact did not even know what I was talking about when I mention this to him although he has been wrapped and had compression recommended for him long enough he may qualify for going ahead and initiating therapy with lymphedema pumps obviously this would have to be insurance approved. Fortunately there is no signs of any systemic infection either. No fevers, chills, nausea, vomiting, or diarrhea. He does have stage III lymphedema. 08/12/2019 on evaluation today patient appears to be doing well with regard to his lower extremities the edema is much better than it was during the last evaluation. Fortunately there is no signs of active infection which is great news. No fevers, chills, nausea, vomiting, or diarrhea. Overall he has been tolerating the wraps without any complication and the wound seems to be doing better as well. We are still working on gathering all the information for ordering the lymphedema pumps at this point. He does need a plan for ongoing compression as well. 10/30; the patient seems to be making nice progress. He only has a small open wound remaining on the right lateral calf. Everything on the left is healed. He has his bilateral external compression wraps. 08/30/2019 on evaluation today patient's right lower extremity actually appears to be doing quite well and in fact appears to be completely healed based on what I am seeing at this time. He shows no signs of active infection which is good news. He does have a blister over the left lower extremity which was previously healed out and he has a Velcro compression stocking on this area. With that being said he does not seem to be doing poorly at all at this point which is good news. All in all however I do think we want to keep an eye on him for another  couple weeks before completely discharging him. Electronic Signature(s) Signed: 08/30/2019  3:27:25 PM By: Worthy Keeler PA-C Entered By: Worthy Keeler on 08/30/2019 15:27:25 [name], [name] ([id number]) -------------------------------------------------------------------------------- Physical Exam Details Patient Name: [name] Date of Service: 08/30/2019 1:45 PM Medical Record Number: [id number] Patient Account Number: [id number] Date of Birth/Sex: [birth year] (85 y.o. M) Treating RN: Montey Hora Primary Care Provider: Philis Fendt Other Clinician: Referring Provider: Philis Fendt Treating Provider/Extender: STONE III, HOYT Weeks in Treatment: 3 Constitutional Well-nourished and well-hydrated in no acute distress. Respiratory normal breathing without difficulty. clear to auscultation bilaterally. Cardiovascular regular rate and rhythm with normal S1, S2. Psychiatric this patient is able to make decisions and demonstrates good insight into disease process. Alert and Oriented x 3. pleasant and cooperative. Notes Patient's wound bed currently at both locations on the lower extremities appears to be healed. There is a blister that seems like it is resolving on the left lower extremity but there is really nothing open and draining at this time his edema is well controlled with his Velcro compression wraps. Overall I am very pleased with what I am seeing at this point. Electronic Signature(s) Signed: 08/30/2019 3:27:54 PM By: Worthy Keeler PA-C Entered By: Worthy Keeler on 08/30/2019 15:27:53 [name], [name] ([id number]) -------------------------------------------------------------------------------- Physician Orders Details Patient Name: [name] Date of Service: 08/30/2019 1:45 PM Medical Record Number: [id number] Patient Account Number: [id number] Date of Birth/Sex: 05/11/1932 (85 y.o. M) Treating RN: Montey Hora Primary Care Provider: Philis Fendt Other Clinician: Referring Provider: Philis Fendt Treating Provider/Extender: Melburn Hake, HOYT Weeks in Treatment: 3 Verbal / Phone Orders: No Diagnosis Coding ICD-10 Coding Code Description I89.0 Lymphedema, not elsewhere classified L97.812 Non-pressure chronic ulcer of other part of right lower leg with fat layer exposed B35.4 Tinea corporis I25.10 Atherosclerotic heart disease of native coronary artery without angina pectoris J44.9 Chronic obstructive pulmonary disease, unspecified E11.628 Type 2 diabetes mellitus with other skin complications P50 Essential (primary) hypertension Follow-up Appointments o Return Appointment in 2 weeks. Edema Control o Patient to wear own Velcro compression garment. - Keep blister covered and notify us if you have any concerns Electronic Signature(s) Signed: 08/30/2019 5:03:01 PM By: Montey Hora Signed: 08/30/2019 5:47:19 PM By: Worthy Keeler PA-C Entered By: Montey Hora on 08/30/2019 14:20:45 [name], [name] ([id number]) -------------------------------------------------------------------------------- Problem List Details Patient Name: [name] Date of Service: 08/30/2019 1:45 PM Medical Record Number: [id number] Patient Account Number: [id number] Date of Birth/Sex: 09-04-1932 (85 y.o. M) Treating RN: Montey Hora Primary Care Provider: Philis Fendt Other Clinician: Referring Provider: Philis Fendt Treating Provider/Extender: Melburn Hake, HOYT Weeks in Treatment: 3 Active Problems ICD-10 Evaluated Encounter Code Description Active Date Today Diagnosis I89.0 Lymphedema, not elsewhere classified 08/04/2019 No Yes L97.812 Non-pressure chronic ulcer of other part of right lower leg 08/04/2019 No Yes with fat layer exposed B35.4 Tinea corporis 08/04/2019 No Yes I25.10 Atherosclerotic heart disease of native coronary artery 08/04/2019 No Yes without angina pectoris J44.9 Chronic obstructive pulmonary disease, unspecified 08/04/2019 No Yes E11.628 Type 2 diabetes mellitus with other skin complications  50/53/9767 No Yes I10 Essential (primary) hypertension 08/04/2019 No Yes Inactive Problems Resolved Problems Electronic Signature(s) Signed: 08/30/2019 1:50:47 PM By: Worthy Keeler PA-C Entered By: Worthy Keeler on 08/30/2019 13:50:46 [name], [name] ([id number]) -------------------------------------------------------------------------------- Progress Note Details Patient Name: [name] Date of Service: 08/30/2019 1:45 PM Medical Record Number: [id number] Patient Account Number: [id number] Date of Birth/Sex: [birth year] (85 y.o. M) Treating RN: Montey Hora Primary Care Provider: Philis Fendt Other Clinician: Referring  Provider: Philis Fendt Treating Provider/Extender: Melburn Hake, HOYT Weeks in Treatment: 3 Subjective Chief Complaint Information obtained from Patient Bilateral LE Lymphedema with right leg ulcer History of Present Illness (HPI) 08/04/2019 on evaluation today patient presents for initial evaluation here in our clinic concerning issues that he has been having with lymphedema for quite some time. He has intermittent issues with blistering of his lower extremities for which she will go to the wound center and typically they will wrap him and then subsequently this gets better. That is been an ongoing issue for quite some time. Currently a couple weeks ago he had blisters pop up on the left leg and the right leg currently there is a small spot open on the right but nothing on the left. He does have a history of coronary artery disease, COPD, diabetes mellitus type 2, and hypertension. Fortunately there is no signs of any cellulitis or active infection at this time. He has never had any lymphedema pumps and in fact did not even know what I was talking about when I mention this to him although he has been wrapped and had compression recommended for him long enough he may qualify for going ahead and initiating therapy with lymphedema pumps obviously this would  have to be insurance approved. Fortunately there is no signs of any systemic infection either. No fevers, chills, nausea, vomiting, or diarrhea. He does have stage III lymphedema. 08/12/2019 on evaluation today patient appears to be doing well with regard to his lower extremities the edema is much better than it was during the last evaluation. Fortunately there is no signs of active infection which is great news. No fevers, chills, nausea, vomiting, or diarrhea. Overall he has been tolerating the wraps without any complication and the wound seems to be doing better as well. We are still working on gathering all the information for ordering the lymphedema pumps at this point. He does need a plan for ongoing compression as well. 10/30; the patient seems to be making nice progress. He only has a small open wound remaining on the right lateral calf. Everything on the left is healed. He has his bilateral external compression wraps. 08/30/2019 on evaluation today patient's right lower extremity actually appears to be doing quite well and in fact appears to be completely healed based on what I am seeing at this time. He shows no signs of active infection which is good news. He does have a blister over the left lower extremity which was previously healed out and he has a Velcro compression stocking on this area. With that being said he does not seem to be doing poorly at all at this point which is good news. All in all however I do think we want to keep an eye on him for another couple weeks before completely discharging him. Patient History Information obtained from Patient. Family History Diabetes - Child, No family history of Cancer, Heart Disease, Hereditary Spherocytosis, Hypertension, Kidney Disease, Lung Disease, Seizures, Stroke, Thyroid Problems, Tuberculosis. Social History Former smoker - 2000, Marital Status - Widowed, Alcohol Use - Never - quit 2000, Drug Use - No History, Caffeine Use  - Daily. Medical History Respiratory Patient has history of Chronic Obstructive Pulmonary Disease (COPD) [name], [name] ([id number]) Denies history of Aspiration, Asthma, Pneumothorax, Sleep Apnea, Tuberculosis Cardiovascular Patient has history of Coronary Artery Disease, Deep Vein Thrombosis, Hypertension, Myocardial Infarction Denies history of Angina, Arrhythmia, Congestive Heart Failure, Hypotension, Peripheral Arterial Disease, Peripheral Venous Disease, Phlebitis, Vasculitis Endocrine Patient has history  of Type II Diabetes Denies history of Type I Diabetes Genitourinary Patient has history of End Stage Renal Disease - CKD Integumentary (Skin) Denies history of History of Burn, History of pressure wounds Musculoskeletal Patient has history of Gout, Osteoarthritis Denies history of Rheumatoid Arthritis, Osteomyelitis Neurologic Patient has history of Neuropathy Denies history of Dementia, Quadriplegia, Paraplegia, Seizure Disorder Medical And Surgical History Notes Cardiovascular HLD, venous stasis Integumentary (Skin) history of venous ulcers Review of Systems (ROS) Constitutional Symptoms (General Health) Denies complaints or symptoms of Fatigue, Fever, Chills, Marked Weight Change. Respiratory Denies complaints or symptoms of Chronic or frequent coughs, Shortness of Breath. Cardiovascular Complains or has symptoms of LE edema. Denies complaints or symptoms of Chest pain. Psychiatric Denies complaints or symptoms of Anxiety, Claustrophobia. Objective Constitutional Well-nourished and well-hydrated in no acute distress. Vitals Time Taken: 1:55 PM, Height: 71 in, Weight: 289 lbs, BMI: 40.3, Temperature: 98.2 F, Pulse: 67 bpm, Respiratory Rate: 18 breaths/min, Blood Pressure: 151/58 mmHg. Respiratory normal breathing without difficulty. clear to auscultation bilaterally. Cardiovascular regular rate and rhythm with normal S1, S2. [name], [name]  ([id number]) Psychiatric this patient is able to make decisions and demonstrates good insight into disease process. Alert and Oriented x 3. pleasant and cooperative. General Notes: Patient's wound bed currently at both locations on the lower extremities appears to be healed. There is a blister that seems like it is resolving on the left lower extremity but there is really nothing open and draining at this time his edema is well controlled with his Velcro compression wraps. Overall I am very pleased with what I am seeing at this point. Integumentary (Hair, Skin) Wound #1 status is Healed - Epithelialized. Original cause of wound was Gradually Appeared. The wound is located on the Right,Posterior Lower Leg. The wound measures 0cm length x 0cm width x 0cm depth; 0cm^2 area and 0cm^3 volume. There is no tunneling or undermining noted. There is a none present amount of drainage noted. The wound margin is flat and intact. There is small (1-33%) granulation within the wound bed. There is a large (67-100%) amount of necrotic tissue within the wound bed including Adherent Slough. Assessment Active Problems ICD-10 Lymphedema, not elsewhere classified Non-pressure chronic ulcer of other part of right lower leg with fat layer exposed Tinea corporis Atherosclerotic heart disease of native coronary artery without angina pectoris Chronic obstructive pulmonary disease, unspecified Type 2 diabetes mellitus with other skin complications Essential (primary) hypertension Plan Follow-up Appointments: Return Appointment in 2 weeks. Edema Control: Patient to wear own Velcro compression garment. - Keep blister covered and notify us if you have any concerns 1. My recommendation at this time based on whereat is that we go ahead and continue with the Velcro compression wraps bilaterally and then subsequently we will see him back in 2 week's time to reevaluate and see where he stands. 2. I do recommend he continue  to elevate his legs much as possible I still think this is good to be of utmost importance as well. 3. I do think that the lymphedema pumps would be beneficial for him and not something that is been ordered although obviously take some time to get this approved and initiated. We will see patient back for reevaluation in 1 week here in the clinic. If anything worsens or changes patient will contact our office for additional recommendations. [name], [name] ([id number]) Electronic Signature(s) Signed: 08/30/2019 3:28:31 PM By: Worthy Keeler PA-C Entered By: Worthy Keeler on 08/30/2019 15:28:30 [name], [name] ([id number]) -------------------------------------------------------------------------------- ROS/PFSH Details  Patient Name: [name], [name] Date of Service: 08/30/2019 1:45 PM Medical Record Number: [id number] Patient Account Number: [id number] Date of Birth/Sex: Jun 09, 1932 (85 y.o. M) Treating RN: Montey Hora Primary Care Provider: Philis Fendt Other Clinician: Referring Provider: Philis Fendt Treating Provider/Extender: Melburn Hake, HOYT Weeks in Treatment: 3 Information Obtained From Patient Constitutional Symptoms (General Health) Complaints and Symptoms: Negative for: Fatigue; Fever; Chills; Marked Weight Change Respiratory Complaints and Symptoms: Negative for: Chronic or frequent coughs; Shortness of Breath Medical History: Positive for: Chronic Obstructive Pulmonary Disease (COPD) Negative for: Aspiration; Asthma; Pneumothorax; Sleep Apnea; Tuberculosis Cardiovascular Complaints and Symptoms: Positive for: LE edema Negative for: Chest pain Medical History: Positive for: Coronary Artery Disease; Deep Vein Thrombosis; Hypertension; Myocardial Infarction Negative for: Angina; Arrhythmia; Congestive Heart Failure; Hypotension; Peripheral Arterial Disease; Peripheral Venous Disease; Phlebitis; Vasculitis Past Medical History Notes: HLD, venous  stasis Psychiatric Complaints and Symptoms: Negative for: Anxiety; Claustrophobia Endocrine Medical History: Positive for: Type II Diabetes Negative for: Type I Diabetes Time with diabetes: 5 years Treated with: Insulin Blood sugar tested every day: Yes Tested : TID Genitourinary Medical History: Positive for: End Stage Renal Disease - CKD Hallas, Jarious ([id number]) Integumentary (Skin) Medical History: Negative for: History of Burn; History of pressure wounds Past Medical History Notes: history of venous ulcers Musculoskeletal Medical History: Positive for: Gout; Osteoarthritis Negative for: Rheumatoid Arthritis; Osteomyelitis Neurologic Medical History: Positive for: Neuropathy Negative for: Dementia; Quadriplegia; Paraplegia; Seizure Disorder Immunizations Pneumococcal Vaccine: Received Pneumococcal Vaccination: Yes Immunization Notes: up to date Implantable Devices None Family and Social History Cancer: No; Diabetes: Yes - Child; Heart Disease: No; Hereditary Spherocytosis: No; Hypertension: No; Kidney Disease: No; Lung Disease: No; Seizures: No; Stroke: No; Thyroid Problems: No; Tuberculosis: No; Former smoker - 2000; Marital Status - Widowed; Alcohol Use: Never - quit 2000; Drug Use: No History; Caffeine Use: Daily; Financial Concerns: No; Food, Clothing or Shelter Needs: No; Support System Lacking: No; Transportation Concerns: No Physician Affirmation I have reviewed and agree with the above information. Electronic Signature(s) Signed: 08/30/2019 5:03:01 PM By: Montey Hora Signed: 08/30/2019 5:47:19 PM By: Worthy Keeler PA-C Entered By: Worthy Keeler on 08/30/2019 15:27:41 Feldhaus, [name] ([id number]) -------------------------------------------------------------------------------- SuperBill Details Patient Name: [name] Date of Service: 08/30/2019 Medical Record Number: [id number] Patient Account Number: [id number] Date of Birth/Sex:  [birth year] (85 y.o. M) Treating RN: Montey Hora Primary Care Provider: Philis Fendt Other Clinician: Referring Provider: Philis Fendt Treating Provider/Extender: Melburn Hake, HOYT Weeks in Treatment: 3 Diagnosis Coding ICD-10 Codes Code Description I89.0 Lymphedema, not elsewhere classified L97.812 Non-pressure chronic ulcer of other part of right lower leg with fat layer exposed B35.4 Tinea corporis I25.10 Atherosclerotic heart disease of native coronary artery without angina pectoris J44.9 Chronic obstructive pulmonary disease, unspecified E11.628 Type 2 diabetes mellitus with other skin complications J03 Essential (primary) hypertension Facility Procedures CPT4 Code: [id number] Description: 99213 - WOUND CARE VISIT-LEV 3 EST PT Modifier: Quantity: 1 Physician Procedures CPT4 Code Description: [id number] - WC PHYS LEVEL 4 - EST PT ICD-10 Diagnosis Description I89.0 Lymphedema, not elsewhere classified L97.812 Non-pressure chronic ulcer of other part of right lower leg w B35.4 Tinea corporis I25.10 Atherosclerotic  heart disease of native coronary artery witho Modifier: ith fat layer expo ut angina pectoris Quantity: 1 sed Electronic Signature(s) Signed: 08/30/2019 3:28:46 PM By: Worthy Keeler PA-C Entered By: Worthy Keeler on 08/30/2019 15:28:46

## 2019-09-01 ENCOUNTER — Telehealth: Payer: Self-pay | Admitting: Physical Therapy

## 2019-09-01 ENCOUNTER — Ambulatory Visit: Payer: Medicare Other | Attending: Physician Assistant | Admitting: Physical Therapy

## 2019-09-01 NOTE — Telephone Encounter (Signed)
Attempted to call patient today after he did not show up for his appointment. No one answered and the VM was full and unable to accept new messages.   Everlean Alstrom. Graylon Good, PT, DPT 09/01/19, 7:01 PM

## 2019-09-13 ENCOUNTER — Other Ambulatory Visit: Payer: Self-pay

## 2019-09-13 ENCOUNTER — Encounter: Payer: Medicare Other | Admitting: Physician Assistant

## 2019-09-13 DIAGNOSIS — I890 Lymphedema, not elsewhere classified: Secondary | ICD-10-CM | POA: Diagnosis not present

## 2019-09-14 NOTE — Progress Notes (Signed)
MALAKY, TETRAULT ([id number]) Visit Report for 09/13/2019 Chief Complaint Document Details Patient Name: LENTON, GENDREAU Date of Service: 09/13/2019 3:45 PM Medical Record Number: [id number] Patient Account Number: [id number] Date of Birth/Sex: [birth year] (85 y.o. M) Treating RN: Montey Hora Primary Care Provider: Philis Fendt Other Clinician: Referring Provider: Philis Fendt Treating Provider/Extender: Melburn Hake, HOYT Weeks in Treatment: 5 Information Obtained from: Patient Chief Complaint Bilateral LE Lymphedema with left leg ulcer Electronic Signature(s) Signed: 09/13/2019 6:12:02 PM By: Worthy Keeler PA-C Entered By: Worthy Keeler on 09/13/2019 16:12:41 Thorp, Jodi Mourning ([id number]) -------------------------------------------------------------------------------- HPI Details Patient Name: [name] Date of Service: 09/13/2019 3:45 PM Medical Record Number: [id number] Patient Account Number: [id number] Date of Birth/Sex: August 26, 1932 (85 y.o. M) Treating RN: Montey Hora Primary Care Provider: Philis Fendt Other Clinician: Referring Provider: Philis Fendt Treating Provider/Extender: Melburn Hake, HOYT Weeks in Treatment: 5 History of Present Illness HPI Description: 08/04/2019 on evaluation today patient presents for initial evaluation here in our clinic concerning issues that he has been having with lymphedema for quite some time. He has intermittent issues with blistering of his lower extremities for which she will go to the wound center and typically they will wrap him and then subsequently this gets better. That is been an ongoing issue for quite some time. Currently a couple weeks ago he had blisters pop up on the left leg and the right leg currently there is a small spot open on the right but nothing on the left. He does have a history of coronary artery disease, COPD, diabetes mellitus type 2, and hypertension. Fortunately there is no signs of any  cellulitis or active infection at this time. He has never had any lymphedema pumps and in fact did not even know what I was talking about when I mention this to him although he has been wrapped and had compression recommended for him long enough he may qualify for going ahead and initiating therapy with lymphedema pumps obviously this would have to be insurance approved. Fortunately there is no signs of any systemic infection either. No fevers, chills, nausea, vomiting, or diarrhea. He does have stage III lymphedema. 08/12/2019 on evaluation today patient appears to be doing well with regard to his lower extremities the edema is much better than it was during the last evaluation. Fortunately there is no signs of active infection which is great news. No fevers, chills, nausea, vomiting, or diarrhea. Overall he has been tolerating the wraps without any complication and the wound seems to be doing better as well. We are still working on gathering all the information for ordering the lymphedema pumps at this point. He does need a plan for ongoing compression as well. 10/30; the patient seems to be making nice progress. He only has a small open wound remaining on the right lateral calf. Everything on the left is healed. He has his bilateral external compression wraps. 08/30/2019 on evaluation today patient's right lower extremity actually appears to be doing quite well and in fact appears to be completely healed based on what I am seeing at this time. He shows no signs of active infection which is good news. He does have a blister over the left lower extremity which was previously healed out and he has a Velcro compression stocking on this area. With that being said he does not seem to be doing poorly at all at this point which is good news. All in all however I do think we want to keep an eye on him for another  couple weeks before completely discharging him. 09/13/2019 on evaluation today patient  appears to be doing well with regard to his right lower extremity although this is very swollen on the left lower extremity he actually has an opening where the blistered area we were observing last week has opened up into an actual wound. Unfortunately this just does not seem to be doing quite as well as we would have liked. I was really hoping that he would be doing just fine today and that we would be able to discharge him as healed today. Fortunately there is no signs of active infection at this point. The area of opening is very small he has very small blisters noted distal to the actual wound opening on the left lower extremity. Electronic Signature(s) Signed: 09/13/2019 6:12:02 PM By: Worthy Keeler PA-C Entered By: Worthy Keeler on 09/13/2019 17:37:19 BOYKIN, BAETZ ([id number]) -------------------------------------------------------------------------------- Physical Exam Details Patient Name: [name] Date of Service: 09/13/2019 3:45 PM Medical Record Number: [id number] Patient Account Number: [id number] Date of Birth/Sex: 1932/09/18 (85 y.o. M) Treating RN: Montey Hora Primary Care Provider: Philis Fendt Other Clinician: Referring Provider: Philis Fendt Treating Provider/Extender: STONE III, HOYT Weeks in Treatment: 5 Constitutional Well-nourished and well-hydrated in no acute distress. Respiratory normal breathing without difficulty. clear to auscultation bilaterally. Cardiovascular regular rate and rhythm with normal S1, S2. Psychiatric this patient is able to make decisions and demonstrates good insight into disease process. Alert and Oriented x 3. pleasant and cooperative. Notes Patient's wound bed currently showed signs of good granulation there just appears to be a very superficial opening to the wound at this point he does have significant bilateral lower extremity edema however. This is despite compression wraps even wearing daily over the past  week since I last saw him. Fortunately there is no signs of active infection at this time. Electronic Signature(s) Signed: 09/13/2019 6:12:02 PM By: Worthy Keeler PA-C Entered By: Worthy Keeler on 09/13/2019 17:37:49 Astarita, Jodi Mourning ([id number]) -------------------------------------------------------------------------------- Physician Orders Details Patient Name: [name] Date of Service: 09/13/2019 3:45 PM Medical Record Number: [id number] Patient Account Number: [id number] Date of Birth/Sex: [birth year] (85 y.o. 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 - Do not get wraps wet Primary Wound Dressing Wound #2 Left,Medial Lower Leg o Silver Alginate Secondary Dressing Wound #2 Left,Medial Lower Leg o XtraSorb Dressing Change Frequency Wound #2 Left,Medial Lower Leg o Change dressing every week Follow-up Appointments o Return Appointment in 1 week. Edema Control Wound #2 Left,Medial Lower Leg o 4 Layer Compression System - Bilateral - anchor with unna Electronic Signature(s) Signed: 09/13/2019 5:06:49 PM By: Montey Hora Signed: 09/13/2019 6:12:02 PM By: Worthy Keeler PA-C Entered By: Montey Hora on 09/13/2019 16:33:01 Qadri, Jodi Mourning  ([id number]) -------------------------------------------------------------------------------- Problem List Details Patient Name: [name] Date of Service: 09/13/2019 3:45 PM Medical Record Number: [id number] Patient Account Number: [id number] Date of Birth/Sex: 08/08/1932 (85 y.o. M) Treating RN: Montey Hora Primary Care Provider: Philis Fendt Other Clinician: Referring Provider:  CLARK, MICHAEL Treating Provider/Extender: Melburn Hake, HOYT Weeks in Treatment: 5 Active Problems ICD-10 Evaluated Encounter Code Description Active Date Today Diagnosis I89.0 Lymphedema, not elsewhere classified 08/04/2019 No Yes L97.822 Non-pressure chronic ulcer of other part of left lower leg with 08/04/2019 No Yes fat layer exposed B35.4 Tinea corporis 08/04/2019 No Yes I25.10 Atherosclerotic heart disease of native coronary artery 08/04/2019 No Yes without angina pectoris J44.9 Chronic obstructive pulmonary disease, unspecified 08/04/2019 No Yes E11.628 Type 2 diabetes mellitus with other skin complications 03/50/0938 No Yes I10 Essential (primary) hypertension 08/04/2019 No Yes Inactive Problems Resolved Problems Electronic Signature(s) Signed: 09/13/2019 6:12:02 PM By: Worthy Keeler PA-C Entered By: Worthy Keeler on 09/13/2019 17:41:22 Eaves, Jodi Mourning ([id number]) -------------------------------------------------------------------------------- Progress Note Details Patient Name: [name] Date of Service: 09/13/2019 3:45 PM Medical Record Number: [id number] Patient Account Number: [id number] Date of Birth/Sex: 08-24-1932 (85 y.o. M) Treating RN: Montey Hora Primary Care Provider: Philis Fendt Other Clinician: Referring Provider: Philis Fendt Treating Provider/Extender: Melburn Hake, HOYT Weeks in Treatment: 5 Subjective Chief Complaint Information obtained from Patient Bilateral LE Lymphedema with left leg ulcer History of Present Illness (HPI) 08/04/2019 on  evaluation today patient presents for initial evaluation here in our clinic concerning issues that he has been having with lymphedema for quite some time. He has intermittent issues with blistering of his lower extremities for which she will go to the wound center and typically they will wrap him and then subsequently this gets better. That is been an ongoing issue for quite some time. Currently a couple weeks ago he had blisters pop up on the left leg and the right leg currently there is a small spot open on the right but nothing on the left. He does have a history of coronary artery disease, COPD, diabetes mellitus type 2, and hypertension. Fortunately there is no signs of any cellulitis or active infection at this time. He has never had any lymphedema pumps and in fact did not even know what I was talking about when I mention this to him although he has been wrapped and had compression recommended for him long enough he may qualify for going ahead and initiating therapy with lymphedema pumps obviously this would have to be insurance approved. Fortunately there is no signs of any systemic infection either. No fevers, chills, nausea, vomiting, or diarrhea. He does have stage III lymphedema. 08/12/2019 on evaluation today patient appears to be doing well with regard to his lower extremities the edema is much better than it was during the last evaluation. Fortunately there is no signs of active infection which is great news. No fevers, chills, nausea, vomiting, or diarrhea. Overall he has been tolerating the wraps without any complication and the wound seems to be doing better as well. We are still working on gathering all the information for ordering the lymphedema pumps at this point. He does need a plan for ongoing compression as well. 10/30; the patient seems to be making nice progress. He only has a small open wound remaining on the right lateral calf. Everything on the left is healed. He has his  bilateral external compression wraps. 08/30/2019 on evaluation today patient's right lower extremity actually appears to be doing quite well and in fact appears to be completely healed based on what I am seeing at this time. He shows no signs of active infection which is good news. He does have a blister over the left lower extremity which was previously healed out and he has a Velcro compression  stocking on this area. With that being said he does not seem to be doing poorly at all at this point which is good news. All in all however I do think we want to keep an eye on him for another couple weeks before completely discharging him. 09/13/2019 on evaluation today patient appears to be doing well with regard to his right lower extremity although this is very swollen on the left lower extremity he actually has an opening where the blistered area we were observing last week has opened up into an actual wound. Unfortunately this just does not seem to be doing quite as well as we would have liked. I was really hoping that he would be doing just fine today and that we would be able to discharge him as healed today. Fortunately there is no signs of active infection at this point. The area of opening is very small he has very small blisters noted distal to the actual wound opening on the left lower extremity. Patient History Information obtained from Patient. Family History Diabetes - Child, No family history of Cancer, Heart Disease, Hereditary Spherocytosis, Hypertension, Kidney Disease, Lung Disease, Seizures, Stroke, Thyroid Problems, Tuberculosis. EREK, KOWAL ([id number]) Social History Former smoker - 2000, Marital Status - Widowed, Alcohol Use - Never - quit 2000, Drug Use - No History, Caffeine Use - Daily. Medical History Respiratory Patient has history of Chronic Obstructive Pulmonary Disease (COPD) Denies history of Aspiration, Asthma, Pneumothorax, Sleep Apnea,  Tuberculosis Cardiovascular Patient has history of Coronary Artery Disease, Deep Vein Thrombosis, Hypertension, Myocardial Infarction Denies history of Angina, Arrhythmia, Congestive Heart Failure, Hypotension, Peripheral Arterial Disease, Peripheral Venous Disease, Phlebitis, Vasculitis Endocrine Patient has history of Type II Diabetes Denies history of Type I Diabetes Genitourinary Patient has history of End Stage Renal Disease - CKD Integumentary (Skin) Denies history of History of Burn, History of pressure wounds Musculoskeletal Patient has history of Gout, Osteoarthritis Denies history of Rheumatoid Arthritis, Osteomyelitis Neurologic Patient has history of Neuropathy Denies history of Dementia, Quadriplegia, Paraplegia, Seizure Disorder Medical And Surgical History Notes Cardiovascular HLD, venous stasis Integumentary (Skin) history of venous ulcers Review of Systems (ROS) Constitutional Symptoms (General Health) Denies complaints or symptoms of Fatigue, Fever, Chills, Marked Weight Change. Respiratory Denies complaints or symptoms of Chronic or frequent coughs, Shortness of Breath. Cardiovascular Complains or has symptoms of LE edema. Denies complaints or symptoms of Chest pain. Psychiatric Denies complaints or symptoms of Anxiety, Claustrophobia. Objective Constitutional Well-nourished and well-hydrated in no acute distress. Vitals Time Taken: 2:00 PM, Height: 71 in, Weight: 289 lbs, BMI: 40.3, Temperature: 98.1 F, Pulse: 66 bpm, Respiratory Rate: 18 breaths/min, Blood Pressure: 153/64 mmHg. JULLIEN, GRANQUIST ([id number]) Respiratory normal breathing without difficulty. clear to auscultation bilaterally. Cardiovascular regular rate and rhythm with normal S1, S2. Psychiatric this patient is able to make decisions and demonstrates good insight into disease process. Alert and Oriented x 3. pleasant and cooperative. General Notes: Patient's wound bed currently showed  signs of good granulation there just appears to be a very superficial opening to the wound at this point he does have significant bilateral lower extremity edema however. This is despite compression wraps even wearing daily over the past week since I last saw him. Fortunately there is no signs of active infection at this time. Integumentary (Hair, Skin) Wound #2 status is Open. Original cause of wound was Trauma. The wound is located on the Left,Medial Lower Leg. The wound measures 0.4cm length x 0.6cm width x 0.1cm depth; 0.188cm^2 area and 0.019cm^3  volume. There is a medium amount of serous drainage noted. The wound margin is flat and intact. There is small (1-33%) red granulation within the wound bed. There is a small (1-33%) amount of necrotic tissue within the wound bed including Adherent Slough. General Notes: Multiple blisters below small wounded area. Assessment Active Problems ICD-10 Lymphedema, not elsewhere classified Non-pressure chronic ulcer of other part of left lower leg with fat layer exposed Tinea corporis Atherosclerotic heart disease of native coronary artery without angina pectoris Chronic obstructive pulmonary disease, unspecified Type 2 diabetes mellitus with other skin complications Essential (primary) hypertension Procedures Wound #2 Pre-procedure diagnosis of Wound #2 is a Lymphedema located on the Left,Medial Lower Leg . There was a Four Layer Compression Therapy Procedure with a pre-treatment ABI of 1.1 by Montey Hora, RN. Post procedure Diagnosis Wound #2: Same as Pre-Procedure Plan LINARD, DAFT ([id number]) Wound Cleansing: May shower with protection. - Do not get wraps wet Primary Wound Dressing: Wound #2 Left,Medial Lower Leg: Silver Alginate Secondary Dressing: Wound #2 Left,Medial Lower Leg: XtraSorb Dressing Change Frequency: Wound #2 Left,Medial Lower Leg: Change dressing every week Follow-up Appointments: Return Appointment in 1  week. Edema Control: Wound #2 Left,Medial Lower Leg: 4 Layer Compression System - Bilateral - anchor with unna 1. My suggestion currently is good to be that we use a silver alginate dressing along with Xtrasorb over the area of wound opening at this point since this is draining quite a bit. Subsequently we will also reinitiate the four layer compression wraps bilaterally. 2. I do recommend the patient continue to elevate his legs as much as possible. He is trying to do so but at the same time it is okay for him to be physically active in my opinion. 3. With regard to the lymphedema pumps he has now had therapy for a full 4 weeks here in our office. The patient does have stage III lymphedema although it is in the early stages of stage III in my opinion. He has been wearing class I compression stockings or higher actually he was using Velcro compression wraps and we have been wrapping them for the past 4 weeks. Unfortunately he has persistent issues with edema as well as blistering that has caused him to even have to be rewrapped again today unfortunately. I think that the lymphedema pumps would be of great benefit for him. We will see patient back for reevaluation in 1 week here in the clinic. If anything worsens or changes patient will contact our office for additional recommendations. Electronic Signature(s) Signed: 09/13/2019 6:12:02 PM By: Worthy Keeler PA-C Entered By: Worthy Keeler on 09/13/2019 17:41:35 Roundy, Jodi Mourning ([id number]) -------------------------------------------------------------------------------- ROS/PFSH Details Patient Name: [name] Date of Service: 09/13/2019 3:45 PM Medical Record Number: [id number] Patient Account Number: [id number] Date of Birth/Sex: 1932/04/11 (85 y.o. M) Treating RN: Montey Hora Primary Care Provider: Philis Fendt Other Clinician: Referring Provider: Philis Fendt Treating Provider/Extender: Melburn Hake, HOYT Weeks in  Treatment: 5 Information Obtained From Patient Constitutional Symptoms (General Health) Complaints and Symptoms: Negative for: Fatigue; Fever; Chills; Marked Weight Change Respiratory Complaints and Symptoms: Negative for: Chronic or frequent coughs; Shortness of Breath Medical History: Positive for: Chronic Obstructive Pulmonary Disease (COPD) Negative for: Aspiration; Asthma; Pneumothorax; Sleep Apnea; Tuberculosis Cardiovascular Complaints and Symptoms: Positive for: LE edema Negative for: Chest pain Medical History: Positive for: Coronary Artery Disease; Deep Vein Thrombosis; Hypertension; Myocardial Infarction Negative for: Angina; Arrhythmia; Congestive Heart Failure; Hypotension; Peripheral Arterial Disease; Peripheral Venous Disease; Phlebitis; Vasculitis Past Medical History  Notes: HLD, venous stasis Psychiatric Complaints and Symptoms: Negative for: Anxiety; Claustrophobia Endocrine Medical History: Positive for: Type II Diabetes Negative for: Type I Diabetes Time with diabetes: 5 years Treated with: Insulin Blood sugar tested every day: Yes Tested : TID Genitourinary Medical History: Positive for: End Stage Renal Disease - CKD Pevey, Thurl ([id number]) Integumentary (Skin) Medical History: Negative for: History of Burn; History of pressure wounds Past Medical History Notes: history of venous ulcers Musculoskeletal Medical History: Positive for: Gout; Osteoarthritis Negative for: Rheumatoid Arthritis; Osteomyelitis Neurologic Medical History: Positive for: Neuropathy Negative for: Dementia; Quadriplegia; Paraplegia; Seizure Disorder Immunizations Pneumococcal Vaccine: Received Pneumococcal Vaccination: Yes Immunization Notes: up to date Implantable Devices None Family and Social History Cancer: No; Diabetes: Yes - Child; Heart Disease: No; Hereditary Spherocytosis: No; Hypertension: No; Kidney Disease: No; Lung Disease: No; Seizures: No; Stroke:  No; Thyroid Problems: No; Tuberculosis: No; Former smoker - 2000; Marital Status - Widowed; Alcohol Use: Never - quit 2000; Drug Use: No History; Caffeine Use: Daily; Financial Concerns: No; Food, Clothing or Shelter Needs: No; Support System Lacking: No; Transportation Concerns: No Physician Affirmation I have reviewed and agree with the above information. Electronic Signature(s) Signed: 09/13/2019 6:12:02 PM By: Worthy Keeler PA-C Signed: 09/14/2019 4:08:09 PM By: Montey Hora Entered By: Worthy Keeler on 09/13/2019 17:37:32 Lenzen, Jodi Mourning ([id number]) -------------------------------------------------------------------------------- SuperBill Details Patient Name: [name] Date of Service: 09/13/2019 Medical Record Number: [id number] Patient Account Number: [id number] Date of Birth/Sex: [birth year] (85 y.o. M) Treating RN: Montey Hora Primary Care Provider: Philis Fendt Other Clinician: Referring Provider: Philis Fendt Treating Provider/Extender: Melburn Hake, HOYT Weeks in Treatment: 5 Diagnosis Coding ICD-10 Codes Code Description I89.0 Lymphedema, not elsewhere classified L97.822 Non-pressure chronic ulcer of other part of left lower leg with fat layer exposed B35.4 Tinea corporis I25.10 Atherosclerotic heart disease of native coronary artery without angina pectoris J44.9 Chronic obstructive pulmonary disease, unspecified E11.628 Type 2 diabetes mellitus with other skin complications Z12 Essential (primary) hypertension Facility Procedures CPT4: Description Modifier Quantity Code [id number] BILATERAL: Application of multi-layer venous compression system; leg (below 1 knee), including ankle and foot. Physician Procedures CPT4 Code Description: [id number] - WC PHYS LEVEL 4 - EST PT ICD-10 Diagnosis Description I89.0 Lymphedema, not elsewhere classified L97.822 Non-pressure chronic ulcer of other part of left lower leg wit B35.4 Tinea corporis I25.10  Atherosclerotic  heart disease of native coronary artery withou Modifier: h fat layer expos t angina pectoris Quantity: 1 ed Electronic Signature(s) Signed: 09/14/2019 2:45:03 PM By: Montey Hora Signed: 09/14/2019 4:47:05 PM By: Worthy Keeler PA-C Previous Signature: 09/13/2019 6:12:02 PM Version By: Worthy Keeler PA-C Entered By: Montey Hora on 09/14/2019 14:45:02

## 2019-09-15 NOTE — Progress Notes (Signed)
[name] ([id number]) Visit Report for 09/13/2019 Arrival Information Details Patient Name: [name], [name] Date of Service: 09/13/2019 3:45 PM Medical Record Number: [id number] Patient Account Number: [id number] Date of Birth/Sex: 18-Dec-1931 (85 y.o. M) Treating RN: Montey Hora Primary Care Areya Lemmerman: Philis Fendt Other Clinician: Referring Indigo Chaddock: Philis Fendt Treating Kyan Giannone/Extender: Melburn Hake, HOYT Weeks in Treatment: 5 Visit Information History Since Last Visit Added or deleted any medications: No Patient Arrived: Cane Any new allergies or adverse reactions: No Arrival Time: 15:59 Had a fall or experienced change in No Accompanied By: friend activities of daily living that may affect Transfer Assistance: None risk of falls: Patient Identification Verified: Yes Signs or symptoms of abuse/neglect since last visito No Secondary Verification Process Completed: Yes Hospitalized since last visit: No Implantable device outside of the clinic excluding No cellular tissue based products placed in the center since last visit: Has Dressing in Place as Prescribed: Yes Pain Present Now: No Electronic Signature(s) Signed: 09/13/2019 4:19:47 PM By: Lorine Bears RCP, RRT, CHT Entered By: Lorine Bears on 09/13/2019 16:00:30 [name] ([id number]) -------------------------------------------------------------------------------- Compression Therapy Details Patient Name: [name] Date of Service: 09/13/2019 3:45 PM Medical Record Number: [id number] Patient Account Number: [id number] Date of Birth/Sex: 05-15-1932 (85 y.o. M) Treating RN: Montey Hora Primary Care Justine Dines: Philis Fendt Other Clinician: Referring Kolton Kienle: Philis Fendt Treating Ola Raap/Extender: Melburn Hake, HOYT Weeks in Treatment: 5 Compression Therapy Performed for Wound Assessment: Wound #2 Left,Medial Lower Leg Performed By: Clinician Montey Hora,  RN Compression Type: Four Layer Pre Treatment ABI: 1.1 Post Procedure Diagnosis Same as Pre-procedure Electronic Signature(s) Signed: 09/13/2019 5:06:49 PM By: Montey Hora Entered By: Montey Hora on 09/13/2019 16:31:03 [name] ([id number]) -------------------------------------------------------------------------------- Encounter Discharge Information Details Patient Name: [name] Date of Service: 09/13/2019 3:45 PM Medical Record Number: [id number] Patient Account Number: [id number] Date of Birth/Sex: [birth year] (85 y.o. M) Treating RN: Montey Hora Primary Care Onya Eutsler: Philis Fendt Other Clinician: Referring Eliseo Withers: Philis Fendt Treating Emmerie Battaglia/Extender: Melburn Hake, HOYT Weeks in Treatment: 5 Encounter Discharge Information Items Discharge Condition: Stable Ambulatory Status: Cane Discharge Destination: Home Transportation: Private Auto Accompanied By: daughter Schedule Follow-up Appointment: Yes Clinical Summary of Care: Electronic Signature(s) Signed: 09/13/2019 4:47:58 PM By: Montey Hora Entered By: Montey Hora on 09/13/2019 16:47:58 Scarlett, Jodi Mourning ([id number]) -------------------------------------------------------------------------------- Lower Extremity Assessment Details Patient Name: [name] Date of Service: 09/13/2019 3:45 PM Medical Record Number: [id number] Patient Account Number: [id number] Date of Birth/Sex: [birth year] (85 y.o. M) Treating RN: Cornell Barman Primary Care Iverna Hammac: Philis Fendt Other Clinician: Referring Yasmeen Manka: Philis Fendt Treating Tabitha Tupper/Extender: Melburn Hake, HOYT Weeks in Treatment: 5 Edema Assessment Assessed: [Left: No] [Right: No] E[Left: dema] [Right: :] Calf Left: Right: Point of Measurement: 37 cm From Medial Instep 50 cm 49.5 cm Ankle Left: Right: Point of Measurement: 10 cm From Medial Instep 30.5 cm 32 cm Vascular Assessment Pulses: Dorsalis Pedis Palpable: [Left:Yes]  [Right:Yes] Electronic Signature(s) Signed: 09/14/2019 5:12:33 PM By: Gretta Cool, BSN, RN, CWS, Kim RN, BSN Entered By: Gretta Cool, BSN, RN, CWS, Kim on 09/13/2019 16:10:15 [name] ([id number]) -------------------------------------------------------------------------------- Multi Wound Chart Details Patient Name: [name] Date of Service: 09/13/2019 3:45 PM Medical Record Number: [id number] Patient Account Number: [id number] Date of Birth/Sex: September 04, 1932 (85 y.o. M) Treating RN: Montey Hora Primary Care Tangee Marszalek: Philis Fendt Other Clinician: Referring Cerise Lieber: Philis Fendt Treating Ammy Lienhard/Extender: Melburn Hake, HOYT Weeks in Treatment: 5 Vital Signs Height(in): 71 Pulse(bpm): 66 Weight(lbs): 289 Blood Pressure(mmHg): 153/64 Body Mass Index(BMI): 40 Temperature(F): 98.1 Respiratory Rate 18 (breaths/min): Photos: [N/A:N/A] Wound Location: Left  Lower Leg - Medial N/A N/A Wounding Event: Trauma N/A N/A Primary Etiology: Lymphedema N/A N/A Comorbid History: Chronic Obstructive N/A N/A Pulmonary Disease (COPD), Coronary Artery Disease, Deep Vein Thrombosis, Hypertension, Myocardial Infarction, Type II Diabetes, End Stage Renal Disease, Gout, Osteoarthritis, Neuropathy Date Acquired: 09/06/2019 N/A N/A Weeks of Treatment: 0 N/A N/A Wound Status: Open N/A N/A Measurements L x W x D 0.4x0.6x0.1 N/A N/A (cm) Area (cm) : 0.188 N/A N/A Volume (cm) : 0.019 N/A N/A % Reduction in Area: 0.00% N/A N/A % Reduction in Volume: 0.00% N/A N/A Classification: Partial Thickness N/A N/A Exudate Amount: Medium N/A N/A Exudate Type: Serous N/A N/A Exudate Color: amber N/A N/A Wound Margin: Flat and Intact N/A N/A Granulation Amount: Small (1-33%) N/A N/A Granulation Quality: Red N/A N/A Necrotic Amount: Small (1-33%) N/A N/A Hebert, [place] ([id number]) Assessment Notes: Multiple blisters below small N/A N/A wounded area. Treatment Notes Electronic  Signature(s) Signed: 09/13/2019 5:06:49 PM By: Montey Hora Entered By: Montey Hora on 09/13/2019 16:29:27 DELVIS, KAU ([id number]) -------------------------------------------------------------------------------- Multi-Disciplinary Care Plan Details Patient Name: [name] Date of Service: 09/13/2019 3:45 PM Medical Record Number: [id number] Patient Account Number: [id number] Date of Birth/Sex: 11-Apr-1932 (85 y.o. M) Treating RN: Montey Hora Primary Care Rosey Eide: Philis Fendt Other Clinician: Referring Tirth Cothron: Philis Fendt Treating Krayton Wortley/Extender: Melburn Hake, HOYT Weeks in Treatment: 5 Active Inactive Necrotic Tissue Nursing Diagnoses: Impaired tissue integrity related to necrotic/devitalized tissue Goals: Necrotic/devitalized tissue will be minimized in the wound bed Date Initiated: 08/04/2019 Target Resolution Date: 08/01/2019 Goal Status: Active Interventions: Assess patient pain level pre-, during and post procedure and prior to discharge Treatment Activities: Apply topical anesthetic as ordered : 08/04/2019 Notes: Orientation to the Wound Care Program Nursing Diagnoses: Knowledge deficit related to the wound healing center program Goals: Patient/caregiver will verbalize understanding of the [place] Date Initiated: 08/04/2019 Target Resolution Date: 08/09/2019 Goal Status: Active Interventions: Provide education on orientation to the wound center Notes: Venous Leg Ulcer Nursing Diagnoses: Actual venous Insuffiency (use after diagnosis is confirmed) Goals: Patient will maintain optimal edema control Date Initiated: 08/04/2019 Target Resolution Date: 08/24/2019 Goal Status: Active [place], Jaheem ([id number]) Interventions: Assess peripheral edema status every visit. Treatment Activities: Therapeutic compression applied : 08/04/2019 Notes: Electronic Signature(s) Signed: 09/13/2019 5:06:49 PM By: Montey Hora Entered By: Montey Hora on 09/13/2019 16:29:09 NATHAN, MOCTEZUMA ([id number]) -------------------------------------------------------------------------------- Pain Assessment Details Patient Name: [name] Date of Service: 09/13/2019 3:45 PM Medical Record Number: [id number] Patient Account Number: [id number] Date of Birth/Sex: 11/23/1931 (85 y.o. M) Treating RN: Montey Hora Primary Care Anber Mckiver: Philis Fendt Other Clinician: Referring Omario Ander: Philis Fendt Treating Donta Mcinroy/Extender: Melburn Hake, HOYT Weeks in Treatment: 5 Active Problems Location of Pain Severity and Description of Pain Patient Has Paino No Site Locations Pain Management and Medication Current Pain Management: Electronic Signature(s) Signed: 09/13/2019 4:19:47 PM By: Paulla Fore, RRT, CHT Signed: 09/13/2019 5:06:49 PM By: Montey Hora Entered By: Lorine Bears on 09/13/2019 16:00:39 [name] ([id number]) -------------------------------------------------------------------------------- Patient/Caregiver Education Details Patient Name: [name] Date of Service: 09/13/2019 3:45 PM Medical Record Number: [id number] Patient Account Number: [id number] Date of Birth/Gender: October 10, 1932 (85 y.o. M) Treating RN: Montey Hora Primary Care Physician: Philis Fendt Other Clinician: Referring Physician: Philis Fendt Treating Physician/Extender: Sharalyn Ink in Treatment: 5 Education Assessment Education Provided To: Patient and Caregiver Education Topics Provided Venous: Handouts: Other: need for ongoing compression Methods: Explain/Verbal Responses: State content correctly Electronic Signature(s) Signed: 09/13/2019 5:06:49 PM By: Montey Hora Entered By: Montey Hora on  09/13/2019 16:46:55 DEUNTE, BLEDSOE ([id number]) -------------------------------------------------------------------------------- Wound Assessment  Details Patient Name: MADDON, HORTON Date of Service: 09/13/2019 3:45 PM Medical Record Number: [id number] Patient Account Number: [id number] Date of Birth/Sex: 1932/09/04 (85 y.o. M) Treating RN: Cornell Barman Primary Care Tylah Mancillas: Philis Fendt Other Clinician: Referring Dilan Fullenwider: Philis Fendt Treating Bernard Donahoo/Extender: Melburn Hake, HOYT Weeks in Treatment: 5 Wound Status Wound Number: 2 Primary Lymphedema Etiology: Wound Location: Left Lower Leg - Medial Wound Open Wounding Event: Trauma Status: Date Acquired: 09/06/2019 Comorbid Chronic Obstructive Pulmonary Disease (COPD), Weeks Of Treatment: 0 History: Coronary Artery Disease, Deep Vein Thrombosis, Clustered Wound: No Hypertension, Myocardial Infarction, Type II Diabetes, End Stage Renal Disease, Gout, Osteoarthritis, Neuropathy Photos Wound Measurements Length: (cm) 0.4 % Reduction Width: (cm) 0.6 % Reduction Depth: (cm) 0.1 Area: (cm) 0.188 Volume: (cm) 0.019 in Area: 0% in Volume: 0% Wound Description Classification: Partial Thickness Foul Odor A Wound Margin: Flat and Intact Slough/Fibr Exudate Amount: Medium Exudate Type: Serous Exudate Color: amber fter Cleansing: No ino No Wound Bed Granulation Amount: Small (1-33%) Granulation Quality: Red Necrotic Amount: Small (1-33%) Necrotic Quality: Adherent Slough Assessment Notes Multiple blisters below small wounded area. KAMORI, BARBIER ([id number]) Treatment Notes Wound #2 (Left, Medial Lower Leg) Notes silvercel, xtrasorb, 4 layer with unna to Engineer, production) Signed: 09/14/2019 5:12:33 PM By: Gretta Cool, BSN, RN, CWS, Kim RN, BSN Entered By: Gretta Cool, BSN, RN, CWS, Kim on 09/13/2019 [place], [place] ([id number]) -------------------------------------------------------------------------------- Vitals Details Patient Name: [name] Date of Service: 09/13/2019 3:45 PM Medical Record Number: [id number] Patient Account Number:  [id number] Date of Birth/Sex: 02/10/1932 (85 y.o. M) Treating RN: Montey Hora Primary Care Reece Mcbroom: Philis Fendt Other Clinician: Referring Huston Stonehocker: Philis Fendt Treating Ezio Wieck/Extender: Melburn Hake, HOYT Weeks in Treatment: 5 Vital Signs Time Taken: 14:00 Temperature (F): 98.1 Height (in): 71 Pulse (bpm): 66 Weight (lbs): 289 Respiratory Rate (breaths/min): 18 Body Mass Index (BMI): 40.3 Blood Pressure (mmHg): 153/64 Reference Range: 80 - 120 mg / dl Electronic Signature(s) Signed: 09/13/2019 4:19:47 PM By: Lorine Bears RCP, RRT, CHT Entered By: Lorine Bears on 09/13/2019 16:01:10

## 2019-09-19 ENCOUNTER — Encounter: Payer: Medicare Other | Admitting: Physician Assistant

## 2019-09-19 ENCOUNTER — Other Ambulatory Visit: Payer: Self-pay

## 2019-09-19 DIAGNOSIS — I890 Lymphedema, not elsewhere classified: Secondary | ICD-10-CM | POA: Diagnosis not present

## 2019-09-21 NOTE — Progress Notes (Signed)
Brandon, [name] ([id number]) Visit Report for 09/19/2019 Chief Complaint Document Details Patient Name: [name], [name] Date of Service: 09/19/2019 1:00 PM Medical Record Number: [id number] Patient Account Number: [id number] Date of Birth/Sex: 1932-07-19 (85 y.o. M) Treating RN: Harold Barban Primary Care Provider: Philis Fendt Other Clinician: Referring Provider: Philis Fendt Treating Provider/Extender: Melburn Hake, HOYT Weeks in Treatment: 6 Information Obtained from: Patient Chief Complaint Bilateral LE Lymphedema with left leg ulcer Electronic Signature(s) Signed: 09/20/2019 5:47:36 PM By: Worthy Keeler PA-C Entered By: Worthy Keeler on 09/19/2019 13:36:31 Birden, Jodi Mourning ([id number]) -------------------------------------------------------------------------------- HPI Details Patient Name: [name] Date of Service: 09/19/2019 1:00 PM Medical Record Number: [id number] Patient Account Number: [id number] Date of Birth/Sex: 11/17/1931 (85 y.o. M) Treating RN: Harold Barban Primary Care Provider: Philis Fendt Other Clinician: Referring Provider: Philis Fendt Treating Provider/Extender: Melburn Hake, HOYT Weeks in Treatment: 6 History of Present Illness HPI Description: 08/04/2019 on evaluation today patient presents for initial evaluation here in our clinic concerning issues that he has been having with lymphedema for quite some time. He has intermittent issues with blistering of his lower extremities for which she will go to the wound center and typically they will wrap him and then subsequently this gets better. That is been an ongoing issue for quite some time. Currently a couple weeks ago he had blisters pop up on the left leg and the right leg currently there is a small spot open on the right but nothing on the left. He does have a history of coronary artery disease, COPD, diabetes mellitus type 2, and hypertension. Fortunately there is no signs of any  cellulitis or active infection at this time. He has never had any lymphedema pumps and in fact did not even know what I was talking about when I mention this to him although he has been wrapped and had compression recommended for him long enough he may qualify for going ahead and initiating therapy with lymphedema pumps obviously this would have to be insurance approved. Fortunately there is no signs of any systemic infection either. No fevers, chills, nausea, vomiting, or diarrhea. He does have stage III lymphedema. 08/12/2019 on evaluation today patient appears to be doing well with regard to his lower extremities the edema is much better than it was during the last evaluation. Fortunately there is no signs of active infection which is great news. No fevers, chills, nausea, vomiting, or diarrhea. Overall he has been tolerating the wraps without any complication and the wound seems to be doing better as well. We are still working on gathering all the information for ordering the lymphedema pumps at this point. He does need a plan for ongoing compression as well. 10/30; the patient seems to be making nice progress. He only has a small open wound remaining on the right lateral calf. Everything on the left is healed. He has his bilateral external compression wraps. 08/30/2019 on evaluation today patient's right lower extremity actually appears to be doing quite well and in fact appears to be completely healed based on what I am seeing at this time. He shows no signs of active infection which is good news. He does have a blister over the left lower extremity which was previously healed out and he has a Velcro compression stocking on this area. With that being said he does not seem to be doing poorly at all at this point which is good news. All in all however I do think we want to keep an eye on him for another  couple weeks before completely discharging him. 09/13/2019 on evaluation today patient  appears to be doing well with regard to his right lower extremity although this is very swollen on the left lower extremity he actually has an opening where the blistered area we were observing last week has opened up into an actual wound. Unfortunately this just does not seem to be doing quite as well as we would have liked. I was really hoping that he would be doing just fine today and that we would be able to discharge him as healed today. Fortunately there is no signs of active infection at this point. The area of opening is very small he has very small blisters noted distal to the actual wound opening on the left lower extremity. 09/19/2019 upon evaluation today patient appears to be doing well with regard to his bilateral lower extremities and in fact he appears to be completely healed at this time which is great news. He was also contacted by the lymphedema pump company and he has been approved apparently for the pumps and is ready to proceed from there as well. They are very happy about this. Electronic Signature(s) Signed: 09/20/2019 5:47:36 PM By: Worthy Keeler PA-C Entered By: Worthy Keeler on 09/19/2019 14:18:02 CRESENCIO, REESOR ([id number]) -------------------------------------------------------------------------------- Physical Exam Details Patient Name: [name] Date of Service: 09/19/2019 1:00 PM Medical Record Number: [id number] Patient Account Number: [id number] Date of Birth/Sex: 1931-10-27 (85 y.o. M) Treating RN: Harold Barban Primary Care Provider: Philis Fendt Other Clinician: Referring Provider: Philis Fendt Treating Provider/Extender: STONE III, HOYT Weeks in Treatment: 6 Constitutional Well-nourished and well-hydrated in no acute distress. Respiratory normal breathing without difficulty. Psychiatric this patient is able to make decisions and demonstrates good insight into disease process. Alert and Oriented x 3. pleasant and  cooperative. Notes Patient's wound currently showed signs of being completely healed. There is no sign of active infection at this time which is good news. No fever chills noted. His edema is under fairly good control as well with the compression wraps. Since he did not bring his wraps with him today we will get a use Tubigrip here in the office at this point. Electronic Signature(s) Signed: 09/20/2019 5:47:36 PM By: Worthy Keeler PA-C Entered By: Worthy Keeler on 09/19/2019 14:18:40 [name] ([id number]) -------------------------------------------------------------------------------- Physician Orders Details Patient Name: [name] Date of Service: 09/19/2019 1:00 PM Medical Record Number: [id number] Patient Account Number: [id number] Date of Birth/Sex: 1932-04-12 (85 y.o. M) Treating RN: Harold Barban Primary Care Provider: Philis Fendt Other Clinician: Referring Provider: Philis Fendt Treating Provider/Extender: Melburn Hake, HOYT Weeks in Treatment: 6 Verbal / Phone Orders: No Diagnosis Coding ICD-10 Coding Code Description I89.0 Lymphedema, not elsewhere classified L97.822 Non-pressure chronic ulcer of other part of left lower leg with fat layer exposed B35.4 Tinea corporis I25.10 Atherosclerotic heart disease of native coronary artery without angina pectoris J44.9 Chronic obstructive pulmonary disease, unspecified E11.628 Type 2 diabetes mellitus with other skin complications Y17 Essential (primary) hypertension Discharge From [facility] Services o Discharge from [facility] an hour twice a day9 Morning and evening. Wear wraps daily. Electronic Signature(s) Signed: 09/20/2019 5:47:36 PM By: Worthy Keeler PA-C Signed: 09/21/2019 4:10:47 PM By: Harold Barban Entered By: Harold Barban on 09/19/2019 14:15:45 Faraone, Jodi Mourning ([id number]) -------------------------------------------------------------------------------- Problem List  Details Patient Name: [name] Date of Service: 09/19/2019 1:00 PM Medical Record Number: [id number] Patient Account Number: [id number] Date of Birth/Sex: 1931/12/10 (85 y.o. M) Treating RN: Harold Barban Primary Care  Provider: Philis Fendt Other Clinician: Referring Provider: Philis Fendt Treating Provider/Extender: Melburn Hake, HOYT Weeks in Treatment: 6 Active Problems ICD-10 Evaluated Encounter Code Description Active Date Today Diagnosis I89.0 Lymphedema, not elsewhere classified 08/04/2019 No Yes L97.822 Non-pressure chronic ulcer of other part of left lower leg with 08/04/2019 No Yes fat layer exposed B35.4 Tinea corporis 08/04/2019 No Yes I25.10 Atherosclerotic heart disease of native coronary artery 08/04/2019 No Yes without angina pectoris J44.9 Chronic obstructive pulmonary disease, unspecified 08/04/2019 No Yes E11.628 Type 2 diabetes mellitus with other skin complications 63/14/9702 No Yes I10 Essential (primary) hypertension 08/04/2019 No Yes Inactive Problems Resolved Problems Electronic Signature(s) Signed: 09/20/2019 5:47:36 PM By: Worthy Keeler PA-C Entered By: Worthy Keeler on 09/19/2019 13:36:26 Weekes, Jodi Mourning ([id number]) -------------------------------------------------------------------------------- Progress Note Details Patient Name: [name] Date of Service: 09/19/2019 1:00 PM Medical Record Number: [id number] Patient Account Number: [id number] Date of Birth/Sex: 08/03/1932 (85 y.o. M) Treating RN: Harold Barban Primary Care Provider: Philis Fendt Other Clinician: Referring Provider: Philis Fendt Treating Provider/Extender: Melburn Hake, HOYT Weeks in Treatment: 6 Subjective Chief Complaint Information obtained from Patient Bilateral LE Lymphedema with left leg ulcer History of Present Illness (HPI) 08/04/2019 on evaluation today patient presents for initial evaluation here in our clinic concerning issues that he has  been having with lymphedema for quite some time. He has intermittent issues with blistering of his lower extremities for which she will go to the wound center and typically they will wrap him and then subsequently this gets better. That is been an ongoing issue for quite some time. Currently a couple weeks ago he had blisters pop up on the left leg and the right leg currently there is a small spot open on the right but nothing on the left. He does have a history of coronary artery disease, COPD, diabetes mellitus type 2, and hypertension. Fortunately there is no signs of any cellulitis or active infection at this time. He has never had any lymphedema pumps and in fact did not even know what I was talking about when I mention this to him although he has been wrapped and had compression recommended for him long enough he may qualify for going ahead and initiating therapy with lymphedema pumps obviously this would have to be insurance approved. Fortunately there is no signs of any systemic infection either. No fevers, chills, nausea, vomiting, or diarrhea. He does have stage III lymphedema. 08/12/2019 on evaluation today patient appears to be doing well with regard to his lower extremities the edema is much better than it was during the last evaluation. Fortunately there is no signs of active infection which is great news. No fevers, chills, nausea, vomiting, or diarrhea. Overall he has been tolerating the wraps without any complication and the wound seems to be doing better as well. We are still working on gathering all the information for ordering the lymphedema pumps at this point. He does need a plan for ongoing compression as well. 10/30; the patient seems to be making nice progress. He only has a small open wound remaining on the right lateral calf. Everything on the left is healed. He has his bilateral external compression wraps. 08/30/2019 on evaluation today patient's right lower extremity  actually appears to be doing quite well and in fact appears to be completely healed based on what I am seeing at this time. He shows no signs of active infection which is good news. He does have a blister over the left lower extremity which was previously healed  out and he has a Velcro compression stocking on this area. With that being said he does not seem to be doing poorly at all at this point which is good news. All in all however I do think we want to keep an eye on him for another couple weeks before completely discharging him. 09/13/2019 on evaluation today patient appears to be doing well with regard to his right lower extremity although this is very swollen on the left lower extremity he actually has an opening where the blistered area we were observing last week has opened up into an actual wound. Unfortunately this just does not seem to be doing quite as well as we would have liked. I was really hoping that he would be doing just fine today and that we would be able to discharge him as healed today. Fortunately there is no signs of active infection at this point. The area of opening is very small he has very small blisters noted distal to the actual wound opening on the left lower extremity. 09/19/2019 upon evaluation today patient appears to be doing well with regard to his bilateral lower extremities and in fact he appears to be completely healed at this time which is great news. He was also contacted by the lymphedema pump company and he has been approved apparently for the pumps and is ready to proceed from there as well. They are very happy about this. Patient History Information obtained from Patient. KOLBE, DELMONACO ([id number]) Family History Diabetes - Child, No family history of Cancer, Heart Disease, Hereditary Spherocytosis, Hypertension, Kidney Disease, Lung Disease, Seizures, Stroke, Thyroid Problems, Tuberculosis. Social History Former smoker - 2000, Marital Status -  Widowed, Alcohol Use - Never - quit 2000, Drug Use - No History, Caffeine Use - Daily. Medical History Respiratory Patient has history of Chronic Obstructive Pulmonary Disease (COPD) Denies history of Aspiration, Asthma, Pneumothorax, Sleep Apnea, Tuberculosis Cardiovascular Patient has history of Coronary Artery Disease, Deep Vein Thrombosis, Hypertension, Myocardial Infarction Denies history of Angina, Arrhythmia, Congestive Heart Failure, Hypotension, Peripheral Arterial Disease, Peripheral Venous Disease, Phlebitis, Vasculitis Endocrine Patient has history of Type II Diabetes Denies history of Type I Diabetes Genitourinary Patient has history of End Stage Renal Disease - CKD Integumentary (Skin) Denies history of History of Burn, History of pressure wounds Musculoskeletal Patient has history of Gout, Osteoarthritis Denies history of Rheumatoid Arthritis, Osteomyelitis Neurologic Patient has history of Neuropathy Denies history of Dementia, Quadriplegia, Paraplegia, Seizure Disorder Medical And Surgical History Notes Cardiovascular HLD, venous stasis Integumentary (Skin) history of venous ulcers Review of Systems (ROS) Constitutional Symptoms (General Health) Denies complaints or symptoms of Fatigue, Fever, Chills, Marked Weight Change. Respiratory Denies complaints or symptoms of Chronic or frequent coughs, Shortness of Breath. Cardiovascular Complains or has symptoms of LE edema. Denies complaints or symptoms of Chest pain. Psychiatric Denies complaints or symptoms of Anxiety, Claustrophobia. Objective Constitutional Cossin, Balin ([id number]) Well-nourished and well-hydrated in no acute distress. Vitals Time Taken: 1:25 PM, Height: 71 in, Weight: 289 lbs, BMI: 40.3, Temperature: 97.4 F, Pulse: 72 bpm, Respiratory Rate: 18 breaths/min, Blood Pressure: 151/60 mmHg. Respiratory normal breathing without difficulty. Psychiatric this patient is able to make  decisions and demonstrates good insight into disease process. Alert and Oriented x 3. pleasant and cooperative. General Notes: Patient's wound currently showed signs of being completely healed. There is no sign of active infection at this time which is good news. No fever chills noted. His edema is under fairly good control as well with the compression  wraps. Since he did not bring his wraps with him today we will get a use Tubigrip here in the office at this point. Integumentary (Hair, Skin) Wound #2 status is Healed - Epithelialized. Original cause of wound was Trauma. The wound is located on the Left,Medial Lower Leg. The wound measures 0cm length x 0cm width x 0cm depth; 0cm^2 area and 0cm^3 volume. There is Fat Layer (Subcutaneous Tissue) Exposed exposed. There is no tunneling or undermining noted. There is a medium amount of serous drainage noted. The wound margin is flat and intact. There is small (1-33%) red granulation within the wound bed. There is a small (1-33%) amount of necrotic tissue within the wound bed including Adherent Slough. Assessment Active Problems ICD-10 Lymphedema, not elsewhere classified Non-pressure chronic ulcer of other part of left lower leg with fat layer exposed Tinea corporis Atherosclerotic heart disease of native coronary artery without angina pectoris Chronic obstructive pulmonary disease, unspecified Type 2 diabetes mellitus with other skin complications Essential (primary) hypertension Plan Discharge From [facility] Services: Discharge from [facility] an hour twice a day9 Morning and evening. Wear wraps daily. 1. my suggestion currently is good to be that we go ahead and continue with the Velcro compression wraps which do seem to be beneficial for the patient. He is also been approved the pumps and I think this is something that as soon as he can get started would be excellent to do as well. The company is good to be getting back  in touch with him later today in order to set up times to come out and so on. EVRETT, HAKIM ([id number]) 2. With regard to the fact that the patient did not have his compression wraps with him today we did go ahead and use Tubigrip in order to go ahead and have something on for compression and meantime until he gets home to put his wraps back on later today. 3. I still recommend the patient elevate his legs as much as possible to keep edema under control. We will see patient back for reevaluation in 1 week here in the clinic. If anything worsens or changes patient will contact our office for additional recommendations. Electronic Signature(s) Signed: 09/20/2019 5:47:36 PM By: Worthy Keeler PA-C Entered By: Worthy Keeler on 09/19/2019 14:19:49 MOMODOU, CONSIGLIO ([id number]) -------------------------------------------------------------------------------- ROS/PFSH Details Patient Name: [name] Date of Service: 09/19/2019 1:00 PM Medical Record Number: [id number] Patient Account Number: [id number] Date of Birth/Sex: 08-28-1932 (85 y.o. M) Treating RN: Harold Barban Primary Care Provider: Philis Fendt Other Clinician: Referring Provider: Philis Fendt Treating Provider/Extender: Melburn Hake, HOYT Weeks in Treatment: 6 Information Obtained From Patient Constitutional Symptoms (General Health) Complaints and Symptoms: Negative for: Fatigue; Fever; Chills; Marked Weight Change Respiratory Complaints and Symptoms: Negative for: Chronic or frequent coughs; Shortness of Breath Medical History: Positive for: Chronic Obstructive Pulmonary Disease (COPD) Negative for: Aspiration; Asthma; Pneumothorax; Sleep Apnea; Tuberculosis Cardiovascular Complaints and Symptoms: Positive for: LE edema Negative for: Chest pain Medical History: Positive for: Coronary Artery Disease; Deep Vein Thrombosis; Hypertension; Myocardial Infarction Negative for: Angina; Arrhythmia; Congestive Heart  Failure; Hypotension; Peripheral Arterial Disease; Peripheral Venous Disease; Phlebitis; Vasculitis Past Medical History Notes: HLD, venous stasis Psychiatric Complaints and Symptoms: Negative for: Anxiety; Claustrophobia Endocrine Medical History: Positive for: Type II Diabetes Negative for: Type I Diabetes Time with diabetes: 5 years Treated with: Insulin Blood sugar tested every day: Yes Tested : TID Genitourinary Medical History: Positive for: End Stage Renal Disease - CKD Sankey, [place] (  [id number]) Integumentary (Skin) Medical History: Negative for: History of Burn; History of pressure wounds Past Medical History Notes: history of venous ulcers Musculoskeletal Medical History: Positive for: Gout; Osteoarthritis Negative for: Rheumatoid Arthritis; Osteomyelitis Neurologic Medical History: Positive for: Neuropathy Negative for: Dementia; Quadriplegia; Paraplegia; Seizure Disorder Immunizations Pneumococcal Vaccine: Received Pneumococcal Vaccination: Yes Immunization Notes: up to date Implantable Devices None Family and Social History Cancer: No; Diabetes: Yes - Child; Heart Disease: No; Hereditary Spherocytosis: No; Hypertension: No; Kidney Disease: No; Lung Disease: No; Seizures: No; Stroke: No; Thyroid Problems: No; Tuberculosis: No; Former smoker - 2000; Marital Status - Widowed; Alcohol Use: Never - quit 2000; Drug Use: No History; Caffeine Use: Daily; Financial Concerns: No; Food, Clothing or Shelter Needs: No; Support System Lacking: No; Transportation Concerns: No Physician Affirmation I have reviewed and agree with the above information. Electronic Signature(s) Signed: 09/20/2019 5:47:36 PM By: Worthy Keeler PA-C Signed: 09/21/2019 4:10:47 PM By: Harold Barban Entered By: Worthy Keeler on 09/19/2019 14:18:25 [place], Jodi Mourning ([id number]) -------------------------------------------------------------------------------- SuperBill Details Patient Name:  [name] Date of Service: 09/19/2019 Medical Record Number: [id number] Patient Account Number: [id number] Date of Birth/Sex: [birth year] (85 y.o. M) Treating RN: Harold Barban Primary Care Provider: Philis Fendt Other Clinician: Referring Provider: Philis Fendt Treating Provider/Extender: Melburn Hake, HOYT Weeks in Treatment: 6 Diagnosis Coding ICD-10 Codes Code Description I89.0 Lymphedema, not elsewhere classified L97.822 Non-pressure chronic ulcer of other part of left lower leg with fat layer exposed B35.4 Tinea corporis I25.10 Atherosclerotic heart disease of native coronary artery without angina pectoris J44.9 Chronic obstructive pulmonary disease, unspecified E11.628 Type 2 diabetes mellitus with other skin complications P80 Essential (primary) hypertension Facility Procedures CPT4 Code: [id number] Description: [id number] - WOUND CARE VISIT-LEV 2 EST PT Modifier: Quantity: 1 Physician Procedures CPT4 Code Description: [id number] - WC PHYS LEVEL 3 - EST PT ICD-10 Diagnosis Description I89.0 Lymphedema, not elsewhere classified L97.822 Non-pressure chronic ulcer of other part of left lower leg wit B35.4 Tinea corporis I25.10 Atherosclerotic  heart disease of native coronary artery withou Modifier: h fat layer expos t angina pectoris Quantity: 1 ed Electronic Signature(s) Signed: 09/20/2019 5:47:36 PM By: Worthy Keeler PA-C Entered By: Worthy Keeler on 09/19/2019 14:20:05

## 2019-09-21 NOTE — Progress Notes (Signed)
ANIL, HAVARD ([id number]) Visit Report for 09/19/2019 Arrival Information Details Patient Name: [name], [name] Date of Service: 09/19/2019 1:00 PM Medical Record Number: [id number] Patient Account Number: [id number] Date of Birth/Sex: 31-Mar-1932 (85 y.o. M) Treating RN: Harold Barban Primary Care Ardene Remley: Philis Fendt Other Clinician: Referring Cara Thaxton: Philis Fendt Treating Kandee Escalante/Extender: Melburn Hake, HOYT Weeks in Treatment: 6 Visit Information History Since Last Visit Added or deleted any medications: No Patient Arrived: Cane Any new allergies or adverse reactions: No Arrival Time: 13:21 Had a fall or experienced change in No Accompanied By: friend activities of daily living that may affect Transfer Assistance: None risk of falls: Patient Identification Verified: Yes Signs or symptoms of abuse/neglect since last visito No Secondary Verification Process Completed: Yes Hospitalized since last visit: No Implantable device outside of the clinic excluding No cellular tissue based products placed in the center since last visit: Has Dressing in Place as Prescribed: Yes Pain Present Now: No Electronic Signature(s) Signed: 09/19/2019 4:03:28 PM By: Lorine Bears RCP, RRT, CHT Entered By: Lorine Bears on 09/19/2019 13:22:57 [name] ([id number]) -------------------------------------------------------------------------------- Clinic Level of Care Assessment Details Patient Name: [name] Date of Service: 09/19/2019 1:00 PM Medical Record Number: [id number] Patient Account Number: [id number] Date of Birth/Sex: 02/03/1932 (85 y.o. M) Treating RN: Harold Barban Primary Care Gaynell Eggleton: Philis Fendt Other Clinician: Referring Roselie Cirigliano: Philis Fendt Treating Denario Bagot/Extender: Melburn Hake, HOYT Weeks in Treatment: 6 Clinic Level of Care Assessment Items TOOL 4 Quantity Score []  - Use when only an EandM is performed on  FOLLOW-UP visit 0 ASSESSMENTS - Nursing Assessment / Reassessment X - Reassessment of Co-morbidities (includes updates in patient status) 1 10 X- 1 5 Reassessment of Adherence to Treatment Plan ASSESSMENTS - Wound and Skin Assessment / Reassessment X - Simple Wound Assessment / Reassessment - one wound 1 5 []  - 0 Complex Wound Assessment / Reassessment - multiple wounds []  - 0 Dermatologic / Skin Assessment (not related to wound area) ASSESSMENTS - Focused Assessment []  - Circumferential Edema Measurements - multi extremities 0 []  - 0 Nutritional Assessment / Counseling / Intervention []  - 0 Lower Extremity Assessment (monofilament, tuning fork, pulses) []  - 0 Peripheral Arterial Disease Assessment (using hand held doppler) ASSESSMENTS - Ostomy and/or Continence Assessment and Care []  - Incontinence Assessment and Management 0 []  - 0 Ostomy Care Assessment and Management (repouching, etc.) PROCESS - Coordination of Care X - Simple Patient / Family Education for ongoing care 1 15 []  - 0 Complex (extensive) Patient / Family Education for ongoing care []  - 0 Staff obtains Programmer, systems, Records, Test Results / Process Orders []  - 0 Staff telephones HHA, Nursing Homes / Clarify orders / etc []  - 0 Routine Transfer to another Facility (non-emergent condition) []  - 0 Routine Hospital Admission (non-emergent condition) []  - 0 New Admissions / Biomedical engineer / Ordering NPWT, Apligraf, etc. []  - 0 Emergency Hospital Admission (emergent condition) X- 1 10 Simple Discharge Coordination [name], [name] ([id number]) []  - 0 Complex (extensive) Discharge Coordination PROCESS - Special Needs []  - Pediatric / Minor Patient Management 0 []  - 0 Isolation Patient Management []  - 0 Hearing / Language / Visual special needs []  - 0 Assessment of Community assistance (transportation, D/C planning, etc.) []  - 0 Additional assistance / Altered mentation []  - 0 Support Surface(s)  Assessment (bed, cushion, seat, etc.) INTERVENTIONS - Wound Cleansing / Measurement X - Simple Wound Cleansing - one wound 1 5 []  - 0 Complex Wound Cleansing - multiple wounds X- 1 5 Wound Imaging (  photographs - any number of wounds) []  - 0 Wound Tracing (instead of photographs) X- 1 5 Simple Wound Measurement - one wound []  - 0 Complex Wound Measurement - multiple wounds INTERVENTIONS - Wound Dressings X - Small Wound Dressing one or multiple wounds 1 10 []  - 0 Medium Wound Dressing one or multiple wounds []  - 0 Large Wound Dressing one or multiple wounds []  - 0 Application of Medications - topical []  - 0 Application of Medications - injection INTERVENTIONS - Miscellaneous []  - External ear exam 0 []  - 0 Specimen Collection (cultures, biopsies, blood, body fluids, etc.) []  - 0 Specimen(s) / Culture(s) sent or taken to Lab for analysis []  - 0 Patient Transfer (multiple staff / Civil Service fast streamer / Similar devices) []  - 0 Simple Staple / Suture removal (25 or less) []  - 0 Complex Staple / Suture removal (26 or more) []  - 0 Hypo / Hyperglycemic Management (close monitor of Blood Glucose) []  - 0 Ankle / Brachial Index (ABI) - do not check if billed separately X- 1 5 Vital Signs [name], [name] ([id number]) Has the patient been seen at the hospital within the last three years: Yes Total Score: 75 Level Of Care: New/Established - Level 2 Electronic Signature(s) Signed: 09/21/2019 4:10:47 PM By: Harold Barban Entered By: Harold Barban on 09/19/2019 14:12:16 [name] ([id number]) -------------------------------------------------------------------------------- Encounter Discharge Information Details Patient Name: [name] Date of Service: 09/19/2019 1:00 PM Medical Record Number: [id number] Patient Account Number: [id number] Date of Birth/Sex: 25-Oct-1931 (85 y.o. M) Treating RN: Harold Barban Primary Care Jenilee Franey: Philis Fendt Other Clinician: Referring  Geneva Barrero: Philis Fendt Treating Cliffard Hair/Extender: Melburn Hake, HOYT Weeks in Treatment: 6 Encounter Discharge Information Items Discharge Condition: Stable Ambulatory Status: Ambulatory Discharge Destination: Home Transportation: Private Auto Accompanied By: daughter Schedule Follow-up Appointment: Yes Clinical Summary of Care: Electronic Signature(s) Signed: 09/21/2019 4:10:47 PM By: Harold Barban Entered By: Harold Barban on 09/19/2019 14:17:40 [name] ([id number]) -------------------------------------------------------------------------------- Lower Extremity Assessment Details Patient Name: [name] Date of Service: 09/19/2019 1:00 PM Medical Record Number: [id number] Patient Account Number: [id number] Date of Birth/Sex: [birth year] (85 y.o. M) Treating RN: Army Melia Primary Care Rayhan Groleau: Philis Fendt Other Clinician: Referring Tersea Aulds: Philis Fendt Treating Ophelia Sipe/Extender: Melburn Hake, HOYT Weeks in Treatment: 6 Edema Assessment Assessed: [Left: No] [Right: No] Edema: [Left: No] [Right: Yes] Calf Left: Right: Point of Measurement: 37 cm From Medial Instep 44 cm 41.5 cm Ankle Left: Right: Point of Measurement: 10 cm From Medial Instep 31 cm 32.5 cm Vascular Assessment Pulses: Dorsalis Pedis Palpable: [Left:Yes] [Right:Yes] Electronic Signature(s) Signed: 09/19/2019 4:12:50 PM By: Army Melia Entered By: Army Melia on 09/19/2019 13:44:33 [name], [name] ([id number]) -------------------------------------------------------------------------------- Multi Wound Chart Details Patient Name: [name] Date of Service: 09/19/2019 1:00 PM Medical Record Number: [id number] Patient Account Number: [id number] Date of Birth/Sex: 1932/02/21 (85 y.o. M) Treating RN: Harold Barban Primary Care Ausha Sieh: Philis Fendt Other Clinician: Referring Dempsy Damiano: Philis Fendt Treating Terrall Bley/Extender: Melburn Hake, HOYT Weeks in Treatment:  6 Vital Signs Height(in): 71 Pulse(bpm): 46 Weight(lbs): 16 Blood Pressure(mmHg): 151/60 Body Mass Index(BMI): 40 Temperature(F): 97.4 Respiratory Rate 18 (breaths/min): Photos: [N/A:N/A] Wound Location: Left Lower Leg - Medial N/A N/A Wounding Event: Trauma N/A N/A Primary Etiology: Lymphedema N/A N/A Comorbid History: Chronic Obstructive N/A N/A Pulmonary Disease (COPD), Coronary Artery Disease, Deep Vein Thrombosis, Hypertension, Myocardial Infarction, Type II Diabetes, End Stage Renal Disease, Gout, Osteoarthritis, Neuropathy Date Acquired: 09/06/2019 N/A N/A Weeks of Treatment: 0 N/A N/A Wound Status: Open N/A N/A Measurements L x W x D  0.1x0.1x0.1 N/A N/A (cm) Area (cm) : 0.008 N/A N/A Volume (cm) : 0.001 N/A N/A % Reduction in Area: 95.70% N/A N/A % Reduction in Volume: 94.70% N/A N/A Classification: Partial Thickness N/A N/A Exudate Amount: Medium N/A N/A Exudate Type: Serous N/A N/A Exudate Color: amber N/A N/A Wound Margin: Flat and Intact N/A N/A Granulation Amount: Small (1-33%) N/A N/A Granulation Quality: Red N/A N/A Necrotic Amount: Small (1-33%) N/A N/A [name], [name] ([id number]) Exposed Structures: Fat Layer (Subcutaneous N/A N/A Tissue) Exposed: Yes Fascia: No Tendon: No Muscle: No Joint: No Bone: No Epithelialization: Large (67-100%) N/A N/A Treatment Notes Electronic Signature(s) Signed: 09/21/2019 4:10:47 PM By: Harold Barban Entered By: Harold Barban on 09/19/2019 14:10:56 [name] ([id number]) -------------------------------------------------------------------------------- Multi-Disciplinary Care Plan Details Patient Name: [name] Date of Service: 09/19/2019 1:00 PM Medical Record Number: [id number] Patient Account Number: [id number] Date of Birth/Sex: 1931-11-07 (85 y.o. M) Treating RN: Harold Barban Primary Care Pharrah Rottman: Philis Fendt Other Clinician: Referring Ashawn Rinehart: Philis Fendt Treating  Quanita Barona/Extender: Melburn Hake, HOYT Weeks in Treatment: 6 Active Inactive Electronic Signature(s) Signed: 09/21/2019 4:10:47 PM By: Harold Barban Entered By: Harold Barban on 09/19/2019 14:16:19 [name], [name] ([id number]) -------------------------------------------------------------------------------- Pain Assessment Details Patient Name: [name] Date of Service: 09/19/2019 1:00 PM Medical Record Number: [id number] Patient Account Number: [id number] Date of Birth/Sex: 02/18/1932 (85 y.o. M) Treating RN: Harold Barban Primary Care Dorrie Cocuzza: Philis Fendt Other Clinician: Referring Kenlynn Houde: Philis Fendt Treating Cayley Pester/Extender: Melburn Hake, HOYT Weeks in Treatment: 6 Active Problems Location of Pain Severity and Description of Pain Patient Has Paino No Site Locations Pain Management and Medication Current Pain Management: Electronic Signature(s) Signed: 09/19/2019 4:03:28 PM By: Paulla Fore, RRT, CHT Signed: 09/21/2019 4:10:47 PM By: Harold Barban Entered By: Lorine Bears on 09/19/2019 13:23:17 [name] ([id number]) -------------------------------------------------------------------------------- Patient/Caregiver Education Details Patient Name: [name] Date of Service: 09/19/2019 1:00 PM Medical Record Number: [id number] Patient Account Number: [id number] Date of Birth/Gender: 01-06-1932 (85 y.o. M) Treating RN: Harold Barban Primary Care Physician: Philis Fendt Other Clinician: Referring Physician: Philis Fendt Treating Physician/Extender: Sharalyn Ink in Treatment: 6 Education Assessment Education Provided To: Patient Education Topics Provided Wound/Skin Impairment: Handouts: Caring for Your Ulcer Methods: Demonstration, Explain/Verbal Responses: State content correctly Electronic Signature(s) Signed: 09/21/2019 4:10:47 PM By: Harold Barban Entered By: Harold Barban on  09/19/2019 14:11:12 [name], [name] ([id number]) -------------------------------------------------------------------------------- Wound Assessment Details Patient Name: [name] Date of Service: 09/19/2019 1:00 PM Medical Record Number: [id number] Patient Account Number: [id number] Date of Birth/Sex: October 03, 1932 (85 y.o. M) Treating RN: Harold Barban Primary Care Doil Kamara: Philis Fendt Other Clinician: Referring Shanaye Rief: Philis Fendt Treating Charlis Harner/Extender: Melburn Hake, HOYT Weeks in Treatment: 6 Wound Status Wound Number: 2 Primary Lymphedema Etiology: Wound Location: Left, Medial Lower Leg Wound Healed - Epithelialized Wounding Event: Trauma Status: Date Acquired: 09/06/2019 Comorbid Chronic Obstructive Pulmonary Disease (COPD), Weeks Of Treatment: 0 History: Coronary Artery Disease, Deep Vein Thrombosis, Clustered Wound: No Hypertension, Myocardial Infarction, Type II Diabetes, End Stage Renal Disease, Gout, Osteoarthritis, Neuropathy Photos Wound Measurements Length: (cm) 0 % Reduction Width: (cm) 0 % Reduction Depth: (cm) 0 Epitheliali Area: (cm) 0 Tunneling: Volume: (cm) 0 Underminin in Area: 100% in Volume: 100% zation: Large (67-100%) No g: No Wound Description Classification: Partial Thickness Foul Odor A Wound Margin: Flat and Intact Slough/Fibr Exudate Amount: Medium Exudate Type: Serous Exudate Color: amber fter Cleansing: No ino No Wound Bed Granulation Amount: Small (1-33%) Exposed Structure Granulation Quality: Red Fascia Exposed: No Necrotic Amount: Small (1-33%) Fat Layer (Subcutaneous Tissue)  Exposed: Yes Necrotic Quality: Adherent Slough Tendon Exposed: No Muscle Exposed: No Joint Exposed: No Bone Exposed: No [name], [name] ([id number]) Electronic Signature(s) Signed: 09/21/2019 4:10:47 PM By: Harold Barban Entered By: Harold Barban on 09/19/2019 14:11:38 [name], [name]  ([id number]) -------------------------------------------------------------------------------- [place] Details Patient Name: [name] Date of Service: 09/19/2019 1:00 PM Medical Record Number: [id number] Patient Account Number: [id number] Date of Birth/Sex: 07/30/1932 (85 y.o. M) Treating RN: Harold Barban Primary Care Soundra Lampley: Philis Fendt Other Clinician: Referring Shemiah Rosch: Philis Fendt Treating Oluwadarasimi Redmon/Extender: Melburn Hake, HOYT Weeks in Treatment: 6 Vital Signs Time Taken: 13:25 Temperature (F): 97.4 Height (in): 71 Pulse (bpm): 72 Weight (lbs): 289 Respiratory Rate (breaths/min): 18 Body Mass Index (BMI): 40.3 Blood Pressure (mmHg): 151/60 Reference Range: 80 - 120 mg / dl Electronic Signature(s) Signed: 09/19/2019 4:03:28 PM By: Lorine Bears RCP, RRT, CHT Entered By: Lorine Bears on 09/19/2019 13:25:44

## 2019-09-23 ENCOUNTER — Ambulatory Visit: Payer: Medicare Other | Admitting: Physician Assistant

## 2019-10-14 ENCOUNTER — Other Ambulatory Visit: Payer: Self-pay

## 2019-10-14 ENCOUNTER — Emergency Department: Payer: Medicare Other

## 2019-10-14 ENCOUNTER — Inpatient Hospital Stay
Admission: EM | Admit: 2019-10-14 | Discharge: 2019-10-20 | DRG: 291 | Disposition: A | Discharge status: Skilled Nursing Facility | Payer: Medicare Other | Attending: Family Medicine | Admitting: Family Medicine

## 2019-10-14 DIAGNOSIS — M109 Gout, unspecified: Secondary | ICD-10-CM | POA: Diagnosis present

## 2019-10-14 DIAGNOSIS — N179 Acute kidney failure, unspecified: Secondary | ICD-10-CM | POA: Diagnosis present

## 2019-10-14 DIAGNOSIS — R0601 Orthopnea: Secondary | ICD-10-CM | POA: Diagnosis present

## 2019-10-14 DIAGNOSIS — I130 Hypertensive heart and chronic kidney disease with heart failure and stage 1 through stage 4 chronic kidney disease, or unspecified chronic kidney disease: Principal | ICD-10-CM | POA: Diagnosis present

## 2019-10-14 DIAGNOSIS — Z7901 Long term (current) use of anticoagulants: Secondary | ICD-10-CM

## 2019-10-14 DIAGNOSIS — Z87440 Personal history of urinary (tract) infections: Secondary | ICD-10-CM

## 2019-10-14 DIAGNOSIS — Z79899 Other long term (current) drug therapy: Secondary | ICD-10-CM

## 2019-10-14 DIAGNOSIS — R748 Abnormal levels of other serum enzymes: Secondary | ICD-10-CM

## 2019-10-14 DIAGNOSIS — R768 Other specified abnormal immunological findings in serum: Secondary | ICD-10-CM

## 2019-10-14 DIAGNOSIS — Z87891 Personal history of nicotine dependence: Secondary | ICD-10-CM

## 2019-10-14 DIAGNOSIS — R634 Abnormal weight loss: Secondary | ICD-10-CM | POA: Diagnosis present

## 2019-10-14 DIAGNOSIS — G4730 Sleep apnea, unspecified: Secondary | ICD-10-CM | POA: Diagnosis present

## 2019-10-14 DIAGNOSIS — I5033 Acute on chronic diastolic (congestive) heart failure: Secondary | ICD-10-CM | POA: Diagnosis present

## 2019-10-14 DIAGNOSIS — I2510 Atherosclerotic heart disease of native coronary artery without angina pectoris: Secondary | ICD-10-CM | POA: Diagnosis present

## 2019-10-14 DIAGNOSIS — E1122 Type 2 diabetes mellitus with diabetic chronic kidney disease: Secondary | ICD-10-CM | POA: Diagnosis present

## 2019-10-14 DIAGNOSIS — W010XXA Fall on same level from slipping, tripping and stumbling without subsequent striking against object, initial encounter: Secondary | ICD-10-CM | POA: Diagnosis present

## 2019-10-14 DIAGNOSIS — E871 Hypo-osmolality and hyponatremia: Secondary | ICD-10-CM | POA: Diagnosis present

## 2019-10-14 DIAGNOSIS — I10 Essential (primary) hypertension: Secondary | ICD-10-CM

## 2019-10-14 DIAGNOSIS — D631 Anemia in chronic kidney disease: Secondary | ICD-10-CM | POA: Diagnosis present

## 2019-10-14 DIAGNOSIS — E669 Obesity, unspecified: Secondary | ICD-10-CM | POA: Diagnosis present

## 2019-10-14 DIAGNOSIS — J449 Chronic obstructive pulmonary disease, unspecified: Secondary | ICD-10-CM | POA: Diagnosis present

## 2019-10-14 DIAGNOSIS — G4733 Obstructive sleep apnea (adult) (pediatric): Secondary | ICD-10-CM | POA: Diagnosis present

## 2019-10-14 DIAGNOSIS — J4540 Moderate persistent asthma, uncomplicated: Secondary | ICD-10-CM | POA: Diagnosis present

## 2019-10-14 DIAGNOSIS — N183 Chronic kidney disease, stage 3 unspecified: Secondary | ICD-10-CM

## 2019-10-14 DIAGNOSIS — Z794 Long term (current) use of insulin: Secondary | ICD-10-CM

## 2019-10-14 DIAGNOSIS — Z20828 Contact with and (suspected) exposure to other viral communicable diseases: Secondary | ICD-10-CM | POA: Diagnosis present

## 2019-10-14 DIAGNOSIS — N1832 Chronic kidney disease, stage 3b: Secondary | ICD-10-CM

## 2019-10-14 DIAGNOSIS — H409 Unspecified glaucoma: Secondary | ICD-10-CM | POA: Diagnosis present

## 2019-10-14 DIAGNOSIS — I509 Heart failure, unspecified: Secondary | ICD-10-CM

## 2019-10-14 DIAGNOSIS — E785 Hyperlipidemia, unspecified: Secondary | ICD-10-CM | POA: Diagnosis present

## 2019-10-14 DIAGNOSIS — R7989 Other specified abnormal findings of blood chemistry: Secondary | ICD-10-CM

## 2019-10-14 DIAGNOSIS — R601 Generalized edema: Secondary | ICD-10-CM

## 2019-10-14 DIAGNOSIS — R531 Weakness: Secondary | ICD-10-CM

## 2019-10-14 DIAGNOSIS — I252 Old myocardial infarction: Secondary | ICD-10-CM

## 2019-10-14 DIAGNOSIS — Z6837 Body mass index (BMI) 37.0-37.9, adult: Secondary | ICD-10-CM

## 2019-10-14 DIAGNOSIS — N1831 Chronic kidney disease, stage 3a: Secondary | ICD-10-CM | POA: Diagnosis present

## 2019-10-14 DIAGNOSIS — Z955 Presence of coronary angioplasty implant and graft: Secondary | ICD-10-CM

## 2019-10-14 DIAGNOSIS — E119 Type 2 diabetes mellitus without complications: Secondary | ICD-10-CM

## 2019-10-14 DIAGNOSIS — E1151 Type 2 diabetes mellitus with diabetic peripheral angiopathy without gangrene: Secondary | ICD-10-CM | POA: Diagnosis present

## 2019-10-14 DIAGNOSIS — N400 Enlarged prostate without lower urinary tract symptoms: Secondary | ICD-10-CM | POA: Diagnosis present

## 2019-10-14 DIAGNOSIS — G2581 Restless legs syndrome: Secondary | ICD-10-CM | POA: Diagnosis present

## 2019-10-14 DIAGNOSIS — Z888 Allergy status to other drugs, medicaments and biological substances status: Secondary | ICD-10-CM

## 2019-10-14 DIAGNOSIS — I5032 Chronic diastolic (congestive) heart failure: Secondary | ICD-10-CM | POA: Diagnosis present

## 2019-10-14 DIAGNOSIS — R296 Repeated falls: Secondary | ICD-10-CM

## 2019-10-14 DIAGNOSIS — K761 Chronic passive congestion of liver: Secondary | ICD-10-CM | POA: Diagnosis present

## 2019-10-14 DIAGNOSIS — Z8546 Personal history of malignant neoplasm of prostate: Secondary | ICD-10-CM

## 2019-10-14 DIAGNOSIS — Z86718 Personal history of other venous thrombosis and embolism: Secondary | ICD-10-CM

## 2019-10-14 DIAGNOSIS — Z7902 Long term (current) use of antithrombotics/antiplatelets: Secondary | ICD-10-CM

## 2019-10-14 LAB — CBC
HCT: 30.6 % — ABNORMAL LOW (ref 39.0–52.0)
Hemoglobin: 10.2 g/dL — ABNORMAL LOW (ref 13.0–17.0)
MCH: 27 pg (ref 26.0–34.0)
MCHC: 33.3 g/dL (ref 30.0–36.0)
MCV: 81 fL (ref 80.0–100.0)
Platelets: 356 10*3/uL (ref 150–400)
RBC: 3.78 MIL/uL — ABNORMAL LOW (ref 4.22–5.81)
RDW: 13.9 % (ref 11.5–15.5)
WBC: 10.9 10*3/uL — ABNORMAL HIGH (ref 4.0–10.5)
nRBC: 0 % (ref 0.0–0.2)

## 2019-10-14 NOTE — ED Triage Notes (Signed)
Patient coming [facility] for repeated falls and weakness. patient reports symptoms onset on Sunday. Patient denies injury from falls. Patient reports decreased appetite since thanksgiving. Patient has expiratory wheezes and bilateral +3 lower leg edema. Patient has hx CHF. Patient reportedly on doxycycline for UTI.

## 2019-10-14 NOTE — ED Provider Notes (Signed)
[facility] Emergency Department Provider Note  ____________________________________________   First MD Initiated Contact with Patient 10/14/19 2322     (approximate)  I have reviewed the triage vital signs and the nursing notes.   HISTORY  Chief Complaint Weakness  Level 5 caveat:  history/ROS somewhat limited by the patient's limited knowledge of his medications and medical history as well as being a vague historian.  HPI [name] is a 85 y.o. male with extensive chronic medical history including CHF, chronic venous stasis, prior DVT on Eliquis, and chronic release frequent urinary tract infections.  He presents tonight by EMS for generalized weakness.  He reports that he has had 4 falls over the last couple of days.  The most recent one was tonight while he was in the shower trying to clean up.  He says that he just slid down the wall.  He did not strike his head and he did not lose consciousness but he feels generally weak all over without any specific focal numbness nor weakness.  The symptoms been gradually worsening over an extended period of time and are currently severe.  He denies contact with COVID-19 patients.  He denies headache, visual changes, neck pain, sore throat, chest pain, nausea, vomiting, abdominal pain, and dysuria.  He says that he does feel more short of breath than usual but not much.  He says that his legs are always swollen but he states his legs are more swollen than usual.  Nothing in particular makes his symptoms better and they are worse with exertion.  He does not know what medications he takes.        Past Medical History:  Diagnosis Date  . Anemia, normocytic normochromic   . BPH (benign prostatic hyperplasia)   . CAD (coronary artery disease)   . Cataracts, bilateral   . Chronic renal insufficiency   . Colon polyp   . COPD (chronic obstructive pulmonary disease) ([place])   . Diabetes mellitus without complication  ([place])   . DVT (deep venous thrombosis) ([place])   . Edema of both legs   . Glaucoma   . Gout   . Heart murmur   . Hyperlipidemia   . Hypertension   . Moderate persistent asthma without complication   . Myocardial infarction ([place])   . Neuromuscular disorder ([place])   . Neuropathy   . Peripheral vascular disease ([place])   . Premature ejaculation   . Prostate cancer ([place])   . RLS (restless legs syndrome)   . Sleep apnea   . Stented coronary artery 11/13/2015  . Uncontrolled type 2 diabetes mellitus with stage 3 chronic kidney disease, with long-term current use of insulin ([place])     There are no problems to display for this patient.   Past Surgical History:  Procedure Laterality Date  . APPENDECTOMY    . [place]  2009  . COLONOSCOPY  2012  . EYE SURGERY    . PROSTATE SURGERY    . TONSILLECTOMY      Prior to Admission medications   Medication Sig Start Date End Date Taking? Authorizing Provider  amLODipine (NORVASC) 10 MG tablet Take 10 mg by mouth daily. 12/03/16   [provider]  atorvastatin (LIPITOR) 80 MG tablet Take 80 mg by mouth daily. 11/15/15   [provider]  carvedilol (COREG) 25 MG tablet Take 25 mg by mouth 2 (two) times daily with a meal.    [provider]  Cholecalciferol (VITAMIN D) 50 MCG (  2000 UT) tablet Take 1,000 Units by mouth daily.    [provider]  clopidogrel (PLAVIX) 75 MG tablet Take 75 mg by mouth daily. 11/12/16   [provider]  DULoxetine (CYMBALTA) 60 MG capsule Take 60 mg by mouth daily.    [provider]  finasteride (PROSCAR) 5 MG tablet Take 5 mg by mouth daily. 05/22/14   [provider]  furosemide (LASIX) 20 MG tablet Take 10 mg by mouth as needed.    [provider]  gabapentin (NEURONTIN) 300 MG capsule Take 300 mg by mouth. Five times daily 10/18/18   [provider]  insulin aspart (NOVOLOG) 100 UNIT/ML injection Inject 50 Units into the skin 3  (three) times daily before meals.    [provider]  insulin detemir (LEVEMIR) 100 UNIT/ML injection Inject 80 Units into the skin at bedtime.    [provider]  Multiple Vitamins-Minerals (MULTIVITAMIN WITH MINERALS) tablet Take 1 tablet by mouth daily.    [provider]  nitroGLYCERIN (NITROSTAT) 0.4 MG SL tablet Place 0.4 mg under the tongue as needed.    [provider]    Allergies Metformin and related  No family history on file.  Social History Social History   Tobacco Use  . Smoking status: Former Research scientist (life sciences)  . Smokeless tobacco: Never Used  Substance Use Topics  . Alcohol use: Not on file  . Drug use: Not on file    Review of Systems Level 5 caveat:  history/ROS somewhat limited by the patient's limited knowledge of his medications and medical history as well as being a vague historian.  Constitutional: Generalized weakness.  No fever or chills. Eyes: No visual changes. ENT: No sore throat. Cardiovascular: Denies chest pain. Respiratory: Recently increased shortness of breath. Gastrointestinal: No abdominal pain.  No nausea, no vomiting.  No diarrhea.  No constipation. Genitourinary: Negative for dysuria. Musculoskeletal: Chronic leg swelling worse now than before.  Negative for neck pain.  Negative for back pain. Integumentary: Negative for rash. Neurological: Negative for headaches, focal weakness or numbness.   ____________________________________________   PHYSICAL EXAM:  VITAL SIGNS: ED Triage Vitals [10/14/19 2316]  Enc Vitals Group     BP (!) 145/63     Pulse Rate 69     Resp (!) 22     Temp 98.7 F (37.1 C)     Temp src      SpO2 99 %     Weight 124.7 kg (275 lb)     Height 1.803 m (5\' 11" )     Head Circumference      Peak Flow      Pain Score 0     Pain Loc      Pain Edu?      Excl. in [place]?     Constitutional: Alert and oriented.  Appears uncomfortable and in mild respiratory distress. Eyes:  Conjunctivae are normal.  Head: Atraumatic. Nose: No congestion/rhinnorhea. Mouth/Throat: Patient is wearing a mask. Neck: No stridor.  No meningeal signs.   Cardiovascular: Normal rate, regular rhythm. Good peripheral circulation. Grossly normal heart sounds. Respiratory: Tachypnea with increased respiratory effort, accessory muscle usage and intercostal muscle retractions.  Wheezing is audible even without the use of stethoscope and there are coarse breath sounds throughout. Gastrointestinal: Abdomen is tense but nontender, consistent with anasarca.  Musculoskeletal: Chronic venous stasis with at least 2+ pitting edema throughout but no evidence of cellulitis or other acute infection  neurologic:  Normal speech and language. No gross  focal neurologic deficits are appreciated.  Patient appears generally weak throughout but is moving all 4 extremities without difficulty and has no gross cranial nerve deficits. Skin:  Skin is warm, dry and intact. Psychiatric: Mood and affect are normal. Speech and behavior are normal.  ____________________________________________   LABS (all labs ordered are listed, but only abnormal results are displayed)  Labs Reviewed  CBC - Abnormal; Notable for the following components:      Result Value   WBC 10.9 (*)    RBC 3.78 (*)    Hemoglobin 10.2 (*)    HCT 30.6 (*)    All other components within normal limits  COMPREHENSIVE METABOLIC PANEL - Abnormal; Notable for the following components:   Sodium 133 (*)    Chloride 94 (*)    Creatinine, Ser 2.04 (*)    Albumin 3.2 (*)    AST 93 (*)    ALT 86 (*)    Alkaline Phosphatase 196 (*)    GFR calc non Af Amer 29 (*)    GFR calc Af Amer 33 (*)    All other components within normal limits  BRAIN NATRIURETIC PEPTIDE - Abnormal; Notable for the following components:   B Natriuretic Peptide 114.0 (*)    All other components within normal limits  URINE CULTURE  SARS CORONAVIRUS 2 (TAT 6-24 HRS)  URINALYSIS,  ROUTINE W REFLEX MICROSCOPIC  POC SARS CORONAVIRUS 2 AG -  ED  POC SARS CORONAVIRUS 2 AG  TROPONIN I (HIGH SENSITIVITY)   ____________________________________________  EKG  ED ECG REPORT I, Hinda Kehr, the attending physician, personally viewed and interpreted this ECG.  Date: 10/14/2019 EKG Time: 23: 20 Rate: 70 Rhythm: Sinus rhythm with first-degree AV block QRS Axis: normal Intervals: PR interval is 223 ms ST/T Wave abnormalities: Non-specific ST segment / T-wave changes, but no clear evidence of acute ischemia. Narrative Interpretation: no definitive evidence of acute ischemia; does not meet STEMI criteria.   ____________________________________________  RADIOLOGY I, Hinda Kehr, personally viewed and evaluated these images (plain radiographs) as part of my medical decision making, as well as reviewing the written report by the radiologist.  ED MD interpretation:  Volume overload / acute CHF exacerbation  Official radiology report(s): DG Chest Portable 1 View  Result Date: 10/15/2019 CLINICAL DATA:  Shortness of breath, wheezing, positive CHF EXAM: PORTABLE CHEST 1 VIEW COMPARISON:  Radiograph 12/18/2018 FINDINGS: Hazy interstitial opacities more pronounced towards the lung bases with some bandlike areas of opacity likely reflecting superimposed subsegmental atelectasis and/or scarring. Mild cephalization of the pulmonary vascularity with some fissural thickening. Cardiomegaly is similar to comparison accounting for differences in technique. No visible effusion or pneumothorax. No acute osseous or soft tissue abnormality. Telemetry leads overlie the chest. IMPRESSION: Features compatible with CHF including cardiomegaly and mild interstitial edema. More bandlike opacities likely reflect subsegmental atelectasis or scarring. Electronically Signed   By: Lovena Le M.D.   On: 10/15/2019 00:06    ____________________________________________   PROCEDURES   Procedure(s)  performed (including Critical Care):  Procedures   ____________________________________________   INITIAL IMPRESSION / MDM / [place] / ED COURSE  As part of my medical decision making, I reviewed the following data within the [facility] notes reviewed and incorporated, Labs reviewed , EKG interpreted , Old chart reviewed, Radiograph reviewed , Discussed with admitting physician (Dr. Damita Dunnings)  and Notes from prior ED visits   Differential diagnosis includes, but is not limited to, acute CHF exacerbation, ACS/MI, PE, acute  infection such as urinary tract infection or cellulitis or pneumonia, COVID-19.  The patient's current presentation is most consistent with a gradually worsening CHF exacerbation.  He has no focal neurological deficits I have a low suspicion for CVA.  Lab work is pending.  EKG shows no signs of ischemia.  Vital signs are stable except for his tachypnea which is consistent with his mild respiratory distress physical exam and likely represents volume overload.  I reviewed his medical record and the most recent outpatient note is from 1 month ago.  In that note it mentions that he is on Eliquis for his prior DVT and that he completed a course of doxycycline and is currently on a low-dose of Keflex because of recurrent urinary tract infections.  Also mentions that he takes furosemide 40 mg p.o. daily.  I will await the results of the chest x-ray to make sure there is no sign of multifocal pneumonia but I anticipate providing furosemide 40 mg IV to begin diuresis.  The patient's blood pressure is slightly elevated but not substantially so, and I am reluctant to provide nitroglycerin to reduce preload which would help his CHF but likely make him even more unsteady although I do not anticipate he will be up moving around very much in the ED.  I will hold off on anything more aggressive than diuretic at this time.  I have also ordered the rapid 15-minute  Covid swab given that his symptoms seem to have been most notable over the last 5 days and he falls within an appropriate window for testing.      Clinical Course as of Oct 14 53  Sat Oct 15, 2019  0012 Chest x-ray is consistent with volume overload/pulmonary edema.  I am beginning treatment with furosemide 40 mg IV.  POC COVID-19 test is pending.  DG Chest Portable 1 View [CF]  0013 Relatively minimal BNP elevation but which does not correlate clinically with the degree of CHF he is exhibiting on physical exam and radiograph.  B Natriuretic Peptide(!): 114.0 [CF]  0020 Reassuring high-sensitivity troponin  Troponin I (High Sensitivity): 8 [CF]  0021 Patient's comprehensive metabolic panel is most notable for a creatinine of 2 which is significantly above his baseline of 1.4 based on his care everywhere records.  He has a normal anion gap and his electrolytes are generally reassuring other than mild hyperglycemia.  His LFTs are minimally elevated which is of unclear significance given the lack of any GI symptoms but his total bilirubin is normal.   [CF]  0037 Negative POC COVID-19 test.  Ordering the "regular" 6-24 swab and will admit.  Ordering hospitalist consult.   [CF]  [phone] Spoke by phone with Dr. Damita Dunnings who will admit   [CF]    Clinical Course User Index [CF] Hinda Kehr, MD     ____________________________________________  FINAL CLINICAL IMPRESSION(S) / ED DIAGNOSES  Final diagnoses:  Acute on chronic congestive heart failure, unspecified heart failure type ([place])  Acute kidney injury ([place])  Acute renal failure, unspecified acute renal failure type ([place])  Elevated LFTs     MEDICATIONS GIVEN DURING THIS VISIT:  Medications  furosemide (LASIX) injection 40 mg (has no administration in time range)     ED Discharge Orders    None      *Please note:  [name] was evaluated in Emergency Department on 10/15/2019 for the symptoms described in the history of  present illness. He was evaluated in the context of the global COVID-19 pandemic,  which necessitated consideration that the patient might be at risk for infection with the SARS-CoV-2 virus that causes COVID-19. Institutional protocols and algorithms that pertain to the evaluation of patients at risk for COVID-19 are in a state of rapid change based on information released by regulatory bodies including the CDC and federal and state organizations. These policies and algorithms were followed during the patient's care in the ED.  Some ED evaluations and interventions may be delayed as a result of limited staffing during the pandemic.*  Note:  This document was prepared using Dragon voice recognition software and may include unintentional dictation errors.   Hinda Kehr, MD 10/15/19 [phone]

## 2019-10-15 ENCOUNTER — Encounter: Payer: Self-pay | Admitting: Internal Medicine

## 2019-10-15 ENCOUNTER — Inpatient Hospital Stay: Payer: Medicare Other

## 2019-10-15 ENCOUNTER — Inpatient Hospital Stay (HOSPITAL_COMMUNITY)
Admit: 2019-10-15 | Discharge: 2019-10-15 | Disposition: A | Discharge status: Home / Self Care | Payer: Medicare Other | Attending: Internal Medicine | Admitting: Internal Medicine

## 2019-10-15 DIAGNOSIS — W010XXA Fall on same level from slipping, tripping and stumbling without subsequent striking against object, initial encounter: Secondary | ICD-10-CM | POA: Diagnosis present

## 2019-10-15 DIAGNOSIS — N189 Chronic kidney disease, unspecified: Secondary | ICD-10-CM

## 2019-10-15 DIAGNOSIS — R7989 Other specified abnormal findings of blood chemistry: Secondary | ICD-10-CM

## 2019-10-15 DIAGNOSIS — I5032 Chronic diastolic (congestive) heart failure: Secondary | ICD-10-CM | POA: Diagnosis present

## 2019-10-15 DIAGNOSIS — I340 Nonrheumatic mitral (valve) insufficiency: Secondary | ICD-10-CM | POA: Diagnosis not present

## 2019-10-15 DIAGNOSIS — R296 Repeated falls: Secondary | ICD-10-CM | POA: Diagnosis present

## 2019-10-15 DIAGNOSIS — J4540 Moderate persistent asthma, uncomplicated: Secondary | ICD-10-CM | POA: Diagnosis present

## 2019-10-15 DIAGNOSIS — R0601 Orthopnea: Secondary | ICD-10-CM | POA: Diagnosis present

## 2019-10-15 DIAGNOSIS — E785 Hyperlipidemia, unspecified: Secondary | ICD-10-CM | POA: Diagnosis present

## 2019-10-15 DIAGNOSIS — I10 Essential (primary) hypertension: Secondary | ICD-10-CM

## 2019-10-15 DIAGNOSIS — E1151 Type 2 diabetes mellitus with diabetic peripheral angiopathy without gangrene: Secondary | ICD-10-CM | POA: Diagnosis present

## 2019-10-15 DIAGNOSIS — R748 Abnormal levels of other serum enzymes: Secondary | ICD-10-CM

## 2019-10-15 DIAGNOSIS — I2510 Atherosclerotic heart disease of native coronary artery without angina pectoris: Secondary | ICD-10-CM

## 2019-10-15 DIAGNOSIS — I5033 Acute on chronic diastolic (congestive) heart failure: Secondary | ICD-10-CM | POA: Diagnosis present

## 2019-10-15 DIAGNOSIS — D631 Anemia in chronic kidney disease: Secondary | ICD-10-CM | POA: Diagnosis present

## 2019-10-15 DIAGNOSIS — M109 Gout, unspecified: Secondary | ICD-10-CM | POA: Diagnosis present

## 2019-10-15 DIAGNOSIS — Z20828 Contact with and (suspected) exposure to other viral communicable diseases: Secondary | ICD-10-CM | POA: Diagnosis present

## 2019-10-15 DIAGNOSIS — R634 Abnormal weight loss: Secondary | ICD-10-CM | POA: Diagnosis present

## 2019-10-15 DIAGNOSIS — E119 Type 2 diabetes mellitus without complications: Secondary | ICD-10-CM

## 2019-10-15 DIAGNOSIS — N179 Acute kidney failure, unspecified: Secondary | ICD-10-CM | POA: Diagnosis present

## 2019-10-15 DIAGNOSIS — Z7901 Long term (current) use of anticoagulants: Secondary | ICD-10-CM | POA: Diagnosis not present

## 2019-10-15 DIAGNOSIS — I509 Heart failure, unspecified: Secondary | ICD-10-CM | POA: Diagnosis not present

## 2019-10-15 DIAGNOSIS — R601 Generalized edema: Secondary | ICD-10-CM

## 2019-10-15 DIAGNOSIS — E669 Obesity, unspecified: Secondary | ICD-10-CM | POA: Diagnosis present

## 2019-10-15 DIAGNOSIS — R531 Weakness: Secondary | ICD-10-CM

## 2019-10-15 DIAGNOSIS — Z794 Long term (current) use of insulin: Secondary | ICD-10-CM

## 2019-10-15 DIAGNOSIS — H409 Unspecified glaucoma: Secondary | ICD-10-CM | POA: Diagnosis present

## 2019-10-15 DIAGNOSIS — K761 Chronic passive congestion of liver: Secondary | ICD-10-CM | POA: Diagnosis present

## 2019-10-15 DIAGNOSIS — G4730 Sleep apnea, unspecified: Secondary | ICD-10-CM | POA: Diagnosis present

## 2019-10-15 DIAGNOSIS — I130 Hypertensive heart and chronic kidney disease with heart failure and stage 1 through stage 4 chronic kidney disease, or unspecified chronic kidney disease: Secondary | ICD-10-CM | POA: Diagnosis present

## 2019-10-15 DIAGNOSIS — G4733 Obstructive sleep apnea (adult) (pediatric): Secondary | ICD-10-CM | POA: Diagnosis present

## 2019-10-15 DIAGNOSIS — J449 Chronic obstructive pulmonary disease, unspecified: Secondary | ICD-10-CM | POA: Diagnosis present

## 2019-10-15 DIAGNOSIS — E871 Hypo-osmolality and hyponatremia: Secondary | ICD-10-CM | POA: Diagnosis present

## 2019-10-15 DIAGNOSIS — E1122 Type 2 diabetes mellitus with diabetic chronic kidney disease: Secondary | ICD-10-CM | POA: Diagnosis present

## 2019-10-15 DIAGNOSIS — N400 Enlarged prostate without lower urinary tract symptoms: Secondary | ICD-10-CM | POA: Diagnosis present

## 2019-10-15 DIAGNOSIS — G2581 Restless legs syndrome: Secondary | ICD-10-CM | POA: Diagnosis present

## 2019-10-15 DIAGNOSIS — N1831 Chronic kidney disease, stage 3a: Secondary | ICD-10-CM | POA: Diagnosis present

## 2019-10-15 DIAGNOSIS — N183 Chronic kidney disease, stage 3 unspecified: Secondary | ICD-10-CM

## 2019-10-15 LAB — HEMOGLOBIN A1C
Hgb A1c MFr Bld: 8.1 % — ABNORMAL HIGH (ref 4.8–5.6)
Mean Plasma Glucose: 185.77 mg/dL

## 2019-10-15 LAB — COMPREHENSIVE METABOLIC PANEL
ALT: 86 U/L — ABNORMAL HIGH (ref 0–44)
AST: 93 U/L — ABNORMAL HIGH (ref 15–41)
Albumin: 3.2 g/dL — ABNORMAL LOW (ref 3.5–5.0)
Alkaline Phosphatase: 196 U/L — ABNORMAL HIGH (ref 38–126)
Anion gap: 12 (ref 5–15)
BUN: 23 mg/dL (ref 8–23)
CO2: 27 mmol/L (ref 22–32)
Calcium: 9.4 mg/dL (ref 8.9–10.3)
Chloride: 94 mmol/L — ABNORMAL LOW (ref 98–111)
Creatinine, Ser: 2.04 mg/dL — ABNORMAL HIGH (ref 0.61–1.24)
GFR calc Af Amer: 33 mL/min — ABNORMAL LOW (ref >60)
GFR calc non Af Amer: 29 mL/min — ABNORMAL LOW (ref >60)
Glucose, Bld: 79 mg/dL (ref 70–99)
Potassium: 4.4 mmol/L (ref 3.5–5.1)
Sodium: 133 mmol/L — ABNORMAL LOW (ref 135–145)
Total Bilirubin: 0.7 mg/dL (ref 0.3–1.2)
Total Protein: 7.5 g/dL (ref 6.5–8.1)

## 2019-10-15 LAB — CBC
HCT: 30.9 % — ABNORMAL LOW (ref 39.0–52.0)
Hemoglobin: 10 g/dL — ABNORMAL LOW (ref 13.0–17.0)
MCH: 27.2 pg (ref 26.0–34.0)
MCHC: 32.4 g/dL (ref 30.0–36.0)
MCV: 84.2 fL (ref 80.0–100.0)
Platelets: 299 10*3/uL (ref 150–400)
RBC: 3.67 MIL/uL — ABNORMAL LOW (ref 4.22–5.81)
RDW: 13.9 % (ref 11.5–15.5)
WBC: 12.6 10*3/uL — ABNORMAL HIGH (ref 4.0–10.5)
nRBC: 0 % (ref 0.0–0.2)

## 2019-10-15 LAB — GLUCOSE, CAPILLARY
Glucose-Capillary: 48 mg/dL — ABNORMAL LOW (ref 70–99)
Glucose-Capillary: 159 mg/dL — ABNORMAL HIGH (ref 70–99)
Glucose-Capillary: 227 mg/dL — ABNORMAL HIGH (ref 70–99)
Glucose-Capillary: 175 mg/dL — ABNORMAL HIGH (ref 70–99)

## 2019-10-15 LAB — TROPONIN I (HIGH SENSITIVITY): Troponin I (High Sensitivity): 8 ng/L (ref <18)

## 2019-10-15 LAB — BASIC METABOLIC PANEL
Anion gap: 13 (ref 5–15)
BUN: 27 mg/dL — ABNORMAL HIGH (ref 8–23)
CO2: 26 mmol/L (ref 22–32)
Calcium: 8.8 mg/dL — ABNORMAL LOW (ref 8.9–10.3)
Chloride: 91 mmol/L — ABNORMAL LOW (ref 98–111)
Creatinine, Ser: 2.12 mg/dL — ABNORMAL HIGH (ref 0.61–1.24)
GFR calc Af Amer: 32 mL/min — ABNORMAL LOW (ref >60)
GFR calc non Af Amer: 28 mL/min — ABNORMAL LOW (ref >60)
Glucose, Bld: 223 mg/dL — ABNORMAL HIGH (ref 70–99)
Potassium: 4.5 mmol/L (ref 3.5–5.1)
Sodium: 130 mmol/L — ABNORMAL LOW (ref 135–145)

## 2019-10-15 LAB — SARS CORONAVIRUS 2 (TAT 6-24 HRS): SARS Coronavirus 2: NEGATIVE

## 2019-10-15 LAB — POC SARS CORONAVIRUS 2 AG: SARS Coronavirus 2 Ag: NEGATIVE

## 2019-10-15 LAB — ECHOCARDIOGRAM COMPLETE
Height: 71 in
Weight: 4400 oz

## 2019-10-15 LAB — BRAIN NATRIURETIC PEPTIDE: B Natriuretic Peptide: 114 pg/mL — ABNORMAL HIGH (ref 0.0–100.0)

## 2019-10-15 MED ORDER — SODIUM CHLORIDE 0.9% FLUSH: 3.0000 mL | INTRAVENOUS | Status: DC | PRN

## 2019-10-15 MED ORDER — ONDANSETRON HCL 4 MG/2ML IJ SOLN: 4.0000 mg | Freq: Four times a day (QID) | INTRAMUSCULAR | Status: DC | PRN

## 2019-10-15 MED ORDER — FUROSEMIDE 10 MG/ML IJ SOLN
40.0000 mg | Freq: Once | INTRAMUSCULAR | Status: AC
  Administered 2019-10-15: 40 mg via INTRAVENOUS
  Filled 2019-10-15: qty 4

## 2019-10-15 MED ORDER — ACETAMINOPHEN 325 MG PO TABS: 650.0000 mg | ORAL_TABLET | ORAL | Status: DC | PRN

## 2019-10-15 MED ORDER — FUROSEMIDE 10 MG/ML IJ SOLN
40.0000 mg | Freq: Two times a day (BID) | INTRAMUSCULAR | Status: DC
  Administered 2019-10-15 – 2019-10-18 (×7): 40 mg via INTRAVENOUS
  Filled 2019-10-15 (×7): qty 4

## 2019-10-15 MED ORDER — FINASTERIDE 5 MG PO TABS
5.0000 mg | ORAL_TABLET | Freq: Every day | ORAL | Status: DC
  Administered 2019-10-15 – 2019-10-20 (×6): 5 mg via ORAL
  Filled 2019-10-15 (×6): qty 1

## 2019-10-15 MED ORDER — NITROGLYCERIN 0.4 MG SL SUBL: 0.4000 mg | SUBLINGUAL_TABLET | SUBLINGUAL | Status: DC | PRN

## 2019-10-15 MED ORDER — FAMOTIDINE 20 MG PO TABS
20.0000 mg | ORAL_TABLET | Freq: Every day | ORAL | Status: DC
  Administered 2019-10-15 – 2019-10-20 (×6): 20 mg via ORAL
  Filled 2019-10-15 (×6): qty 1

## 2019-10-15 MED ORDER — FUROSEMIDE 10 MG/ML IJ SOLN: 40.0000 mg | Freq: Once | INTRAMUSCULAR | Status: DC

## 2019-10-15 MED ORDER — CLOPIDOGREL BISULFATE 75 MG PO TABS
75.0000 mg | ORAL_TABLET | Freq: Every day | ORAL | Status: DC
  Administered 2019-10-16 – 2019-10-17 (×2): 75 mg via ORAL
  Filled 2019-10-15 (×2): qty 1

## 2019-10-15 MED ORDER — INSULIN ASPART 100 UNIT/ML ~~LOC~~ SOLN
0.0000 [IU] | Freq: Three times a day (TID) | SUBCUTANEOUS | Status: DC
  Administered 2019-10-15: 5 [IU] via SUBCUTANEOUS
  Administered 2019-10-16: 8 [IU] via SUBCUTANEOUS
  Administered 2019-10-16: 3 [IU] via SUBCUTANEOUS
  Administered 2019-10-16 – 2019-10-17 (×2): 5 [IU] via SUBCUTANEOUS
  Administered 2019-10-17: 2 [IU] via SUBCUTANEOUS
  Administered 2019-10-17: 5 [IU] via SUBCUTANEOUS
  Administered 2019-10-18: 3 [IU] via SUBCUTANEOUS
  Administered 2019-10-18: 5 [IU] via SUBCUTANEOUS
  Administered 2019-10-19 – 2019-10-20 (×4): 3 [IU] via SUBCUTANEOUS
  Filled 2019-10-15 (×12): qty 1

## 2019-10-15 MED ORDER — INSULIN DETEMIR 100 UNIT/ML ~~LOC~~ SOLN
30.0000 [IU] | Freq: Every day | SUBCUTANEOUS | Status: DC
  Administered 2019-10-16 – 2019-10-20 (×5): 30 [IU] via SUBCUTANEOUS
  Filled 2019-10-15 (×7): qty 0.3

## 2019-10-15 MED ORDER — APIXABAN 5 MG PO TABS
5.0000 mg | ORAL_TABLET | Freq: Two times a day (BID) | ORAL | Status: DC
  Administered 2019-10-15 – 2019-10-20 (×10): 5 mg via ORAL
  Filled 2019-10-15 (×10): qty 1

## 2019-10-15 MED ORDER — ASPIRIN EC 81 MG PO TBEC
81.0000 mg | DELAYED_RELEASE_TABLET | Freq: Every day | ORAL | Status: DC
  Administered 2019-10-15 – 2019-10-20 (×6): 81 mg via ORAL
  Filled 2019-10-15 (×7): qty 1

## 2019-10-15 MED ORDER — SODIUM CHLORIDE 0.9 % IV SOLN: 250.0000 mL | INTRAVENOUS | Status: DC | PRN

## 2019-10-15 MED ORDER — SODIUM CHLORIDE 0.9% FLUSH
3.0000 mL | Freq: Two times a day (BID) | INTRAVENOUS | Status: DC
  Administered 2019-10-15 – 2019-10-20 (×10): 3 mL via INTRAVENOUS

## 2019-10-15 MED ORDER — CARVEDILOL 25 MG PO TABS
25.0000 mg | ORAL_TABLET | Freq: Two times a day (BID) | ORAL | Status: DC
  Administered 2019-10-15 – 2019-10-19 (×9): 25 mg via ORAL
  Filled 2019-10-15 (×9): qty 1

## 2019-10-15 MED ORDER — ATORVASTATIN CALCIUM 80 MG PO TABS
80.0000 mg | ORAL_TABLET | Freq: Every day | ORAL | Status: DC
  Administered 2019-10-15 – 2019-10-19 (×5): 80 mg via ORAL
  Filled 2019-10-15 (×7): qty 1

## 2019-10-15 NOTE — ED Notes (Signed)
Pt given meal tray and orange juice per request.

## 2019-10-15 NOTE — ED Notes (Signed)
Patient transported to CT 

## 2019-10-15 NOTE — Progress Notes (Signed)
Patient admitted from ED, oriented to new room. A&O, VSS. NO complaints at this time. Tele in place. Warm blankets given to patient.

## 2019-10-15 NOTE — ED Notes (Signed)
Pt given Orange Juice due to low CBG

## 2019-10-15 NOTE — H&P (Signed)
History and Physical    [name] MWU:[id number] DOB: 05-04-1932 DOA: 10/14/2019  PCP: [name], PA-C  Patient coming from: home   I have personally briefly reviewed patient's old medical records in [place]  Chief Complaint: fall  HPI: [name] is a 85 y.o. obese male lives at home with his daughter ambulate with a cane, with medical history significant for congestive heart failure, CKD 3, insulin-dependent type 2 diabetes, coronary artery disease, chronic anticoagulation secondary to history of DVT and history of frequent falls was brought to the emergency room because of generalized weakness, salting and multiple falls for the past few weeks most recently day while at room.  Patient is notably tachypneic, unable to contribute fully to the history.  States that he was washing off he slipped and fell.  Denies prior lightheadedness, visual disturbance, denies numbness or tingling in an arm leg or face, neck pain or palpitations.  Has he has been feeling poorly for the past several weeks.  Noted to have a cough.  He denies fever, chest pain change in bowel habit.   ED Course: On arrival in the emergency room, he was afebrile with a temperature of 98.1.  He was tachypneic with respirations of 22 but with O2 sat of 99% on room air.  Blood pressure was 145/63 and pulse 68.  First troponin was 8, BNP elevated at 114 and chest x-ray showed CHF with mild interstitial edema.  EKG showed normal sinus rhythm with no acute ST-T wave changes.  Did have elevation in liver enzymes with AST of 93 ALT of 86 and alk phos of 196.  And was elevated at 2.04 above baseline of 1.4.  He was also anemic with hemoglobin of 10.2.  White cell count 10,000.  Initial Covid test was negative PCR is pending  Review of Systems: As per HPI otherwise 10 point review of systems negative.    Past Medical History:  Diagnosis Date  . Anemia, normocytic normochromic   . BPH (benign prostatic  hyperplasia)   . CAD (coronary artery disease)   . Cataracts, bilateral   . Chronic renal insufficiency   . Colon polyp   . COPD (chronic obstructive pulmonary disease) ([place])   . Diabetes mellitus without complication ([place])   . DVT (deep venous thrombosis) ([place])   . Edema of both legs   . Glaucoma   . Gout   . Heart murmur   . Hyperlipidemia   . Hypertension   . Moderate persistent asthma without complication   . Myocardial infarction ([place])   . Neuromuscular disorder ([place])   . Neuropathy   . Peripheral vascular disease ([place])   . Premature ejaculation   . Prostate cancer ([place])   . RLS (restless legs syndrome)   . Sleep apnea   . Stented coronary artery 11/13/2015  . Uncontrolled type 2 diabetes mellitus with stage 3 chronic kidney disease, with long-term current use of insulin ([place])     Past Surgical History:  Procedure Laterality Date  . APPENDECTOMY    . [place]  2009  . COLONOSCOPY  2012  . EYE SURGERY    . PROSTATE SURGERY    . TONSILLECTOMY       reports that he has quit smoking. He has never used smokeless tobacco. No history on file for alcohol and drug.  Allergies  Allergen Reactions  . Metformin And Related Diarrhea    No family history on file.   Prior to Admission medications  Medication Sig Start Date End Date Taking? Authorizing Provider  aspirin 81 MG chewable tablet Chew by mouth. 07/15/19  Yes [provider]  cephALEXin (KEFLEX) 250 MG capsule Take by mouth. 08/26/19 08/20/20 Yes [provider]  diclofenac Sodium (VOLTAREN) 1 % GEL Apply topically. 04/18/19 04/17/20 Yes [provider]  famotidine (PEPCID) 40 MG tablet Take 20 mg by mouth at bedtime as needed for heartburn. 08/26/19 08/25/20 Yes [provider]  amLODipine (NORVASC) 10 MG tablet Take 10 mg by mouth daily. 12/03/16   [provider]  atorvastatin (LIPITOR) 80 MG tablet Take 80 mg by mouth daily. 11/15/15   [provider]   carvedilol (COREG) 25 MG tablet Take 25 mg by mouth 2 (two) times daily with a meal.    [provider]  Cholecalciferol (VITAMIN D) 50 MCG (2000 UT) tablet Take 1,000 Units by mouth daily.    [provider]  clopidogrel (PLAVIX) 75 MG tablet Take 75 mg by mouth daily. 11/12/16   [provider]  DULoxetine (CYMBALTA) 60 MG capsule Take 60 mg by mouth daily.    [provider]  ELIQUIS 5 MG TABS tablet Take 5 mg by mouth 2 (two) times daily. 10/03/19   [provider]  finasteride (PROSCAR) 5 MG tablet Take 5 mg by mouth daily. 05/22/14   [provider]  furosemide (LASIX) 20 MG tablet Take 10 mg by mouth as needed.    [provider]  gabapentin (NEURONTIN) 300 MG capsule Take 300 mg by mouth. Five times daily 10/18/18   [provider]  insulin aspart (NOVOLOG) 100 UNIT/ML injection Inject 50 Units into the skin 3 (three) times daily before meals.    [provider]  insulin detemir (LEVEMIR) 100 UNIT/ML injection Inject 80 Units into the skin at bedtime.    [provider]  Multiple Vitamin (MULTI-VITAMIN) tablet Take 1 tablet by mouth daily.    [provider]  Multiple Vitamins-Minerals (MULTIVITAMIN WITH MINERALS) tablet Take 1 tablet by mouth daily.    [provider]  nitroGLYCERIN (NITROSTAT) 0.4 MG SL tablet Place 0.4 mg under the tongue as needed.    [provider]    Physical Exam: Vitals:   10/14/19 2316  BP: (!) 145/63  Pulse: 69  Resp: (!) 22  Temp: 98.7 F (37.1 C)  SpO2: 99%  Weight: 124.7 kg  Height: 5' 11"  (1.803 m)     Vitals:   10/14/19 2316  BP: (!) 145/63  Pulse: 69  Resp: (!) 22  Temp: 98.7 F (37.1 C)  SpO2: 99%  Weight: 124.7 kg  Height: 5' 11"  (1.803 m)    Constitutional: Patient in mild respiratory distress, tachypneic but able to converse .  He is alert and oriented x3 eyes: PERRL, lids and conjunctivae normal ENMT: Mucous  membranes are moist.  Neck: normal, supple, no masses, no thyromegaly Respiratory: Tachypneic, few wheezes few rales.   Cardiovascular: Regular rate and rhythm, no murmurs / rubs / gallops.  3+ pitting edema bilateral lower extremities  abdomen: Obese, no tenderness, no masses palpated. No hepatosplenomegaly. Bowel sounds positive.  Musculoskeletal: no clubbing / cyanosis. No joint deformity upper and lower extremities. Good ROM, no contractures. Normal muscle tone.  Skin: Chronic appearing venous stasis changes in the ankles.  Not indurated.  No rash or ulcers Neurologic: No gross focal neurologic deficit  psychiatric:Alert and oriented x 3. Normal mood.    Labs on Admission: I have personally reviewed following labs  and imaging studies  CBC: Recent Labs  Lab 10/14/19 2338  WBC 10.9*  HGB 10.2*  HCT 30.6*  MCV 81.0  PLT 867   Basic Metabolic Panel: Recent Labs  Lab 10/14/19 2338  NA 133*  K 4.4  CL 94*  CO2 27  GLUCOSE 79  BUN 23  CREATININE 2.04*  CALCIUM 9.4   GFR: Estimated Creatinine Clearance: 35.6 mL/min (A) (by C-G formula based on SCr of 2.04 mg/dL (H)). Liver Function Tests: Recent Labs  Lab 10/14/19 2338  AST 93*  ALT 86*  ALKPHOS 196*  BILITOT 0.7  PROT 7.5  ALBUMIN 3.2*   No results for input(s): LIPASE, AMYLASE in the last 168 hours. No results for input(s): AMMONIA in the last 168 hours. Coagulation Profile: No results for input(s): INR, PROTIME in the last 168 hours. Cardiac Enzymes: No results for input(s): CKTOTAL, CKMB, CKMBINDEX, TROPONINI in the last 168 hours. BNP (last 3 results) No results for input(s): PROBNP in the last 8760 hours. HbA1C: No results for input(s): HGBA1C in the last 72 hours. CBG: No results for input(s): GLUCAP in the last 168 hours. Lipid Profile: No results for input(s): CHOL, HDL, LDLCALC, TRIG, CHOLHDL, LDLDIRECT in the last 72 hours. Thyroid Function Tests: No results for input(s): TSH, T4TOTAL, FREET4,  T3FREE, THYROIDAB in the last 72 hours. Anemia Panel: No results for input(s): VITAMINB12, FOLATE, FERRITIN, TIBC, IRON, RETICCTPCT in the last 72 hours. Urine analysis:    Component Value Date/Time   COLORURINE YELLOW (A) 12/18/2018 1938   APPEARANCEUR HAZY (A) 12/18/2018 1938   LABSPEC 1.016 12/18/2018 1938   PHURINE 5.0 12/18/2018 1938   GLUCOSEU 150 (A) 12/18/2018 1938   HGBUR SMALL (A) 12/18/2018 1938   BILIRUBINUR NEGATIVE 12/18/2018 1938   KETONESUR NEGATIVE 12/18/2018 1938   PROTEINUR NEGATIVE 12/18/2018 1938   NITRITE NEGATIVE 12/18/2018 1938   LEUKOCYTESUR SMALL (A) 12/18/2018 1938    Radiological Exams on Admission: DG Chest Portable 1 View  Result Date: 10/15/2019 CLINICAL DATA:  Shortness of breath, wheezing, positive CHF EXAM: PORTABLE CHEST 1 VIEW COMPARISON:  Radiograph 12/18/2018 FINDINGS: Hazy interstitial opacities more pronounced towards the lung bases with some bandlike areas of opacity likely reflecting superimposed subsegmental atelectasis and/or scarring. Mild cephalization of the pulmonary vascularity with some fissural thickening. Cardiomegaly is similar to comparison accounting for differences in technique. No visible effusion or pneumothorax. No acute osseous or soft tissue abnormality. Telemetry leads overlie the chest. IMPRESSION: Features compatible with CHF including cardiomegaly and mild interstitial edema. More bandlike opacities likely reflect subsegmental atelectasis or scarring. Electronically Signed   By: Lovena Le M.D.   On: 10/15/2019 00:06    EKG: Independently reviewed.  Acute ST-T wave changes  Assessment/Plan    Acute exacerbation of CHF, suspect diastolic (congestive heart failure) (HCC) with  anasarca --Continue to cycle troponins echocardiogram in the a.m. --IV Lasix, Coreg.  Holding ACE inhibitors because of renal function --Daily weights salt restriction --Echocardiogram in the a.m. --Monitor input and output  Possible  Covid --Patient presents with tachypnea and a cough but is afebrile and is not hypoxic --Covid test negative follow-up PCR --Airborne precautions until ruled out  Accidental fall with history of frequent falls --patient reports slipping and falling in the bathroom but has been falling over the past few weeks --Denies pain. CT head and C spine --Fall precautions --Physical therapy evaluation when improved   Elevated liver enzymes --Secondary  to hepatic congestion from heart failure exacerbation --Continue to monitor.  Expect improvement  as acute heart failure improves    CAD (coronary artery disease) --Denies complaints of chest pain --Continue home aspirin, Plavix    Insulin dependent type 2 diabetes mellitus ([place]) --Continue basal insulin but at reduced dose and with supplemental insulin coverage --Follow-up A1c    Essential hypertension --Blood pressure controlled.  Continue amlodipine   chronic anticoagulation secondary to DVT --New Eliquis    Acute kidney injury superimposed on CKD 3(HCC) --Creatinine elevated at 2 above baseline of 1.4 --Continue to monitor      DVT prophylaxis: on full dose with eliquis  Code Status: full   Disposition Plan: Back to previous home environment Consults called: none Admission status: inp    Athena Masse MD [facility]   If 7PM-7AM, please contact night-coverage [email or web address] Password [email or web address]  10/15/2019, 1:35 AM

## 2019-10-15 NOTE — Progress Notes (Signed)
PROGRESS NOTE    Willett Lefeber  OZH:[id number] DOB: 1932-09-08 DOA: 10/14/2019 PCP: [name], PA-C    Brief Narrative:  Rochester Serpe is a 85 y.o. obese male lives at home with his daughter ambulate with a cane, with medical history significant for congestive heart failure, CKD 3, insulin-dependent type 2 diabetes, coronary artery disease, chronic anticoagulation secondary to history of DVT and history of frequent falls was brought to the emergency room because of generalized weakness,    Consultants:   None  Procedures: Echo  Antimicrobials:   None   Subjective: Patient seen and examined in the ED this morning.  Patient reports just not feeling well and weak.  No shortness of breath or chest pain.  Currently getting echo.  No abdominal pain or chills.  Objective: Vitals:   10/15/19 0330 10/15/19 0929 10/15/19 1346 10/15/19 1415  BP: 140/60 (!) 144/77 135/68 (!) 140/59  Pulse: 61 63 68 67  Resp:  20 18 20   Temp:    98.5 F (36.9 C)  TempSrc:    Oral  SpO2: 94% 99% 100% 99%  Weight:    129.7 kg  Height:    5\' 11"  (1.803 m)   No intake or output data in the 24 hours ending 10/15/19 1508 Filed Weights   10/14/19 2316 10/15/19 1415  Weight: 124.7 kg 129.7 kg    Examination:  General exam: Appears calm and comfortable , NAD, obese Respiratory system: mild crackles scattered b/l with expiratory forced wheez Cardiovascular system: S1 & S2 heard, RRR. No JVD, murmurs, rubs, gallops or clicks.  Gastrointestinal system: Abdomen is nondistended, soft and nontender obese normal bowel sounds heard. Central nervous system: Alert and oriented.  Grossly intact Extremities: Positive edema bilaterally 2+ with chronic skin changes Skin: Warm dry Psychiatry: Judgement and insight appear normal. Mood & affect appropriate.     Data Reviewed: I have personally reviewed following labs and imaging studies  CBC: Recent Labs  Lab 10/14/19 2338 10/15/19 1336  WBC  10.9* 12.6*  HGB 10.2* 10.0*  HCT 30.6* 30.9*  MCV 81.0 84.2  PLT 356 629   Basic Metabolic Panel: Recent Labs  Lab 10/14/19 2338 10/15/19 1336  NA 133* 130*  K 4.4 4.5  CL 94* 91*  CO2 27 26  GLUCOSE 79 223*  BUN 23 27*  CREATININE 2.04* 2.12*  CALCIUM 9.4 8.8*   GFR: Estimated Creatinine Clearance: 35 mL/min (A) (by C-G formula based on SCr of 2.12 mg/dL (H)). Liver Function Tests: Recent Labs  Lab 10/14/19 2338  AST 93*  ALT 86*  ALKPHOS 196*  BILITOT 0.7  PROT 7.5  ALBUMIN 3.2*   No results for input(s): LIPASE, AMYLASE in the last 168 hours. No results for input(s): AMMONIA in the last 168 hours. Coagulation Profile: No results for input(s): INR, PROTIME in the last 168 hours. Cardiac Enzymes: No results for input(s): CKTOTAL, CKMB, CKMBINDEX, TROPONINI in the last 168 hours. BNP (last 3 results) No results for input(s): PROBNP in the last 8760 hours. HbA1C: No results for input(s): HGBA1C in the last 72 hours. CBG: Recent Labs  Lab 10/15/19 0851 10/15/19 1046  GLUCAP 48* 159*   Lipid Profile: No results for input(s): CHOL, HDL, LDLCALC, TRIG, CHOLHDL, LDLDIRECT in the last 72 hours. Thyroid Function Tests: No results for input(s): TSH, T4TOTAL, FREET4, T3FREE, THYROIDAB in the last 72 hours. Anemia Panel: No results for input(s): VITAMINB12, FOLATE, FERRITIN, TIBC, IRON, RETICCTPCT in the last 72 hours. Sepsis Labs: No results for  input(s): PROCALCITON, LATICACIDVEN in the last 168 hours.  Recent Results (from the past 240 hour(s))  SARS CORONAVIRUS 2 (TAT 6-24 HRS) Nasopharyngeal Nasopharyngeal Swab     Status: None   Collection Time: 10/15/19  1:35 AM   Specimen: Nasopharyngeal Swab  Result Value Ref Range Status   SARS Coronavirus 2 NEGATIVE NEGATIVE Final    Comment: (NOTE) SARS-CoV-2 target nucleic acids are NOT DETECTED. The SARS-CoV-2 RNA is generally detectable in upper and lower respiratory specimens during the acute phase of  infection. Negative results do not preclude SARS-CoV-2 infection, do not rule out co-infections with other pathogens, and should not be used as the sole basis for treatment or other patient management decisions. Negative results must be combined with clinical observations, patient history, and epidemiological information. The expected result is Negative. Fact Sheet for Patients: SugarRoll.be Fact Sheet for Healthcare Providers: https://www.woods-mathews.com/ This test is not yet approved or cleared by the [place] FDA and  has been authorized for detection and/or diagnosis of SARS-CoV-2 by FDA under an Emergency Use Authorization (EUA). This EUA will remain  in effect (meaning this test can be used) for the duration of the COVID-19 declaration under Section 56 4(b)(1) of the Act, 21 U.S.C. section 360bbb-3(b)(1), unless the authorization is terminated or revoked sooner. Performed at [facility], [place] [address] [place], [place] [address]          Radiology Studies: CT HEAD WO CONTRAST  Result Date: 10/15/2019 CLINICAL DATA:  Head trauma EXAM: CT HEAD WITHOUT CONTRAST TECHNIQUE: Contiguous axial images were obtained from the base of the skull through the vertex without intravenous contrast. COMPARISON:  None. FINDINGS: Brain: No evidence of acute territorial infarction, hemorrhage, hydrocephalus,extra-axial collection or mass lesion/mass effect. There is dilatation the ventricles and sulci consistent with age-related atrophy. Low-attenuation changes in the deep white matter consistent with small vessel ischemia. Vascular: No hyperdense vessel or unexpected calcification. Skull: The skull is intact. No fracture or focal lesion identified. Sinuses/Orbits: The visualized paranasal sinuses and mastoid air cells are clear. The orbits and globes intact. Other: None IMPRESSION: No acute intracranial abnormality. Findings consistent with age  related atrophy and chronic small vessel ischemia Electronically Signed   By: Prudencio Pair M.D.   On: 10/15/2019 03:49   DG Chest Portable 1 View  Result Date: 10/15/2019 CLINICAL DATA:  Shortness of breath, wheezing, positive CHF EXAM: PORTABLE CHEST 1 VIEW COMPARISON:  Radiograph 12/18/2018 FINDINGS: Hazy interstitial opacities more pronounced towards the lung bases with some bandlike areas of opacity likely reflecting superimposed subsegmental atelectasis and/or scarring. Mild cephalization of the pulmonary vascularity with some fissural thickening. Cardiomegaly is similar to comparison accounting for differences in technique. No visible effusion or pneumothorax. No acute osseous or soft tissue abnormality. Telemetry leads overlie the chest. IMPRESSION: Features compatible with CHF including cardiomegaly and mild interstitial edema. More bandlike opacities likely reflect subsegmental atelectasis or scarring. Electronically Signed   By: Lovena Le M.D.   On: 10/15/2019 00:06        Scheduled Meds: . aspirin EC  81 mg Oral Daily  . atorvastatin  80 mg Oral q1800  . carvedilol  25 mg Oral BID WC  . finasteride  5 mg Oral Daily  . furosemide  40 mg Intravenous BID  . furosemide  40 mg Intravenous Once  . insulin aspart  0-15 Units Subcutaneous TID WC  . insulin detemir  30 Units Subcutaneous Daily  . sodium chloride flush  3 mL Intravenous Q12H  Continuous Infusions: . sodium chloride      Assessment & Plan:   Principal Problem:   Acute exacerbation of CHF (congestive heart failure) (HCC) Active Problems:   Anasarca   CAD (coronary artery disease)   Insulin dependent type 2 diabetes mellitus (HCC)   Essential hypertension   Chronic anticoagulation   Acute kidney injury superimposed on CKD (HCC)   CKD (chronic kidney disease) stage 3, GFR 30-59 ml/min   Generalized weakness   Frequent falls   Elevated liver enzymes   Acute on chronic diastolic CHF (congestive heart failure)  (HCC)  Acute exacerbation of CHF, suspect diastolic (congestive heart failure) ([place]) with  anasarca --BNP mildly elevated however patient is obese and this may be unreliable result  troponin negative x1 echo pending --Continue IV Lasix   Holding ACE inhibitors because of renal function --Daily weights salt restriction --Monitor input and output Continue Coreg  Possible Covid? Ruled out as pcr negative too. --Patient presented with tachypnea and a cough but is afebrile and is not hypoxic   Accidental fall with history of frequent falls --patient reports slipping and falling in the bathroom but has been falling over the past few weeks --CT head negative. --Fall precautions -PT/OT consult in am when feeling better    Elevated liver enzymes --Secondary  to hepatic congestion from heart failure exacerbation --Continue to monitor.   Expect improvement as acute heart failure improves, if not will ck ruq [place]    CAD (coronary artery disease) --Denies complaints of chest pain --Continue home aspirin, Plavix Continue beta blk    Insulin dependent type 2 diabetes mellitus ([place]) --Continue basal insulin but at reduced dose and with supplemental insulin coverage --Follow-up A1c    Essential hypertension --Blood pressure controlled.  Continue amlodipine and beta blk   chronic anticoagulation secondary to DVT --New Eliquis    Acute kidney injury superimposed on CKD 3(HCC) --Creatinine elevated at 2 above baseline of 1.4 Nephrology consulted Ck UA for proteinuria    DVT prophylaxis: Eliquis Code Status: full Family Communication: none at bedside Disposition Plan: likely be here 2 MN stays as he is volume overloaded and needs to be more medically stable.       LOS: 0 days   Time spent: 45 minutes with more than 50% COC    Nolberto Hanlon, MD [facility] Pager [phone]  If 7PM-7AM, please contact night-coverage [email or web address] Password [facility] 10/15/2019,  3:08 PM

## 2019-10-15 NOTE — Progress Notes (Signed)
*  PRELIMINARY RESULTS* Echocardiogram 2D Echocardiogram has been performed.  [name] 10/15/2019, 10:41 AM

## 2019-10-16 ENCOUNTER — Inpatient Hospital Stay: Payer: Medicare Other

## 2019-10-16 DIAGNOSIS — Z7901 Long term (current) use of anticoagulants: Secondary | ICD-10-CM

## 2019-10-16 LAB — RENAL FUNCTION PANEL
Albumin: 2.7 g/dL — ABNORMAL LOW (ref 3.5–5.0)
Anion gap: 13 (ref 5–15)
BUN: 31 mg/dL — ABNORMAL HIGH (ref 8–23)
CO2: 26 mmol/L (ref 22–32)
Calcium: 8.5 mg/dL — ABNORMAL LOW (ref 8.9–10.3)
Chloride: 91 mmol/L — ABNORMAL LOW (ref 98–111)
Creatinine, Ser: 2.02 mg/dL — ABNORMAL HIGH (ref 0.61–1.24)
GFR calc Af Amer: 34 mL/min — ABNORMAL LOW (ref >60)
GFR calc non Af Amer: 29 mL/min — ABNORMAL LOW (ref >60)
Glucose, Bld: 224 mg/dL — ABNORMAL HIGH (ref 70–99)
Phosphorus: 4.3 mg/dL (ref 2.5–4.6)
Potassium: 4 mmol/L (ref 3.5–5.1)
Sodium: 130 mmol/L — ABNORMAL LOW (ref 135–145)

## 2019-10-16 LAB — CBC
HCT: 27.8 % — ABNORMAL LOW (ref 39.0–52.0)
Hemoglobin: 9.3 g/dL — ABNORMAL LOW (ref 13.0–17.0)
MCH: 27 pg (ref 26.0–34.0)
MCHC: 33.5 g/dL (ref 30.0–36.0)
MCV: 80.6 fL (ref 80.0–100.0)
Platelets: 307 10*3/uL (ref 150–400)
RBC: 3.45 MIL/uL — ABNORMAL LOW (ref 4.22–5.81)
RDW: 13.9 % (ref 11.5–15.5)
WBC: 12.3 10*3/uL — ABNORMAL HIGH (ref 4.0–10.5)
nRBC: 0 % (ref 0.0–0.2)

## 2019-10-16 LAB — GLUCOSE, CAPILLARY
Glucose-Capillary: 191 mg/dL — ABNORMAL HIGH (ref 70–99)
Glucose-Capillary: 276 mg/dL — ABNORMAL HIGH (ref 70–99)
Glucose-Capillary: 241 mg/dL — ABNORMAL HIGH (ref 70–99)
Glucose-Capillary: 152 mg/dL — ABNORMAL HIGH (ref 70–99)

## 2019-10-16 MED ORDER — ALBUMIN HUMAN 25 % IV SOLN
25.0000 g | Freq: Two times a day (BID) | INTRAVENOUS | Status: AC
  Administered 2019-10-16 – 2019-10-17 (×4): 25 g via INTRAVENOUS
  Filled 2019-10-16 (×4): qty 100

## 2019-10-16 NOTE — Consult Note (Signed)
[facility]  CONSULT NOTE    Date: 10/16/2019                  Patient Name:  [name]  MRN: [id number]  DOB: Jul 30, 1932  Age / Sex: 85 y.o., male         PCP: [name], PA-C                 Service Requesting Consult: Dr. Kurtis Bushman                 Reason for Consult: Acute renal failure            History of Present Illness: Mr. [name] admitted for acute exacerbation of diastolic dysfunction. Patient states he has seen a nephrologist in [place] in the past. Currently not following with a nephrologist.   He states that he falling a great deal at home. He was brought it for work up of his falls and progressive weakness. Claims to be taking all his medications as prescribed.    Medications: Outpatient medications: Medications Prior to Admission  Medication Sig Dispense Refill Last Dose  . amLODipine (NORVASC) 10 MG tablet Take 10 mg by mouth daily.   10/14/2019 at 0900  . aspirin 81 MG chewable tablet Chew by mouth.   10/14/2019 at 0900  . atorvastatin (LIPITOR) 80 MG tablet Take 80 mg by mouth daily.   10/14/2019 at 0900  . carvedilol (COREG) 25 MG tablet Take 25 mg by mouth 2 (two) times daily with a meal.   10/14/2019 at 0900  . Cholecalciferol (VITAMIN D) 50 MCG (2000 UT) tablet Take 2,000 Units by mouth daily.    10/14/2019 at 0900  . clopidogrel (PLAVIX) 75 MG tablet Take 75 mg by mouth daily.   10/14/2019 at 0900  . diclofenac Sodium (VOLTAREN) 1 % GEL Apply 2 g topically 4 (four) times daily.    prn at prn  . ELIQUIS 5 MG TABS tablet Take 5 mg by mouth every 12 (twelve) hours.    10/14/2019 at 0900  . famotidine (PEPCID) 40 MG tablet Take 20 mg by mouth at bedtime as needed for heartburn.   Past Week at prn  . finasteride (PROSCAR) 5 MG tablet Take 5 mg by mouth daily.   10/14/2019 at 0900  . furosemide (LASIX) 40 MG tablet Take 40 mg by mouth daily.    10/14/2019 at 0900  . gabapentin (NEURONTIN) 300 MG capsule Take 300 mg by  mouth 3 (three) times daily. Five times daily   10/14/2019 at prn  . insulin aspart (NOVOLOG) 100 UNIT/ML FlexPen Inject 12 Units into the skin 3 (three) times daily with meals.    10/14/2019 at 1830  . Insulin Detemir (LEVEMIR) 100 UNIT/ML Pen Inject 35 Units into the skin 2 (two) times daily.    10/14/2019 at 1830  . Multiple Vitamin (MULTI-VITAMIN) tablet Take 1 tablet by mouth daily.   Past Week at unknown  . nitroGLYCERIN (NITROSTAT) 0.4 MG SL tablet Place 0.4 mg under the tongue as needed.   prn at prn  . DULoxetine (CYMBALTA) 60 MG capsule Take 60 mg by mouth daily.   Not Taking at Unknown time    Current medications: Current Facility-Administered Medications  Medication Dose Route Frequency Provider Last Rate Last Admin  . 0.9 %  sodium chloride infusion  250 mL Intravenous PRN Athena Masse, MD      . acetaminophen (TYLENOL) tablet 650 mg  650 mg  Oral Q4H PRN Athena Masse, MD      . apixaban Arne Cleveland) tablet 5 mg  5 mg Oral BID Nolberto Hanlon, MD   5 mg at 10/16/19 9562  . aspirin EC tablet 81 mg  81 mg Oral Daily Athena Masse, MD   81 mg at 10/16/19 1308  . atorvastatin (LIPITOR) tablet 80 mg  80 mg Oral q1800 Athena Masse, MD   80 mg at 10/15/19 1623  . carvedilol (COREG) tablet 25 mg  25 mg Oral BID WC Athena Masse, MD   25 mg at 10/16/19 0903  . clopidogrel (PLAVIX) tablet 75 mg  75 mg Oral Daily Nolberto Hanlon, MD   75 mg at 10/16/19 0903  . famotidine (PEPCID) tablet 20 mg  20 mg Oral Daily Nolberto Hanlon, MD   20 mg at 10/16/19 0903  . finasteride (PROSCAR) tablet 5 mg  5 mg Oral Daily Athena Masse, MD   5 mg at 10/16/19 6578  . furosemide (LASIX) injection 40 mg  40 mg Intravenous BID Athena Masse, MD   40 mg at 10/16/19 4696  . furosemide (LASIX) injection 40 mg  40 mg Intravenous Once Judd Gaudier V, MD      . insulin aspart (novoLOG) injection 0-15 Units  0-15 Units Subcutaneous TID WC Athena Masse, MD   3 Units at 10/16/19 0902  . insulin detemir  (LEVEMIR) injection 30 Units  30 Units Subcutaneous Daily Athena Masse, MD   30 Units at 10/16/19 [phone]  . nitroGLYCERIN (NITROSTAT) SL tablet 0.4 mg  0.4 mg Sublingual PRN Athena Masse, MD      . ondansetron [facility]) injection 4 mg  4 mg Intravenous Q6H PRN Judd Gaudier V, MD      . sodium chloride flush (NS) 0.9 % injection 3 mL  3 mL Intravenous Q12H Athena Masse, MD   3 mL at 10/16/19 0902  . sodium chloride flush (NS) 0.9 % injection 3 mL  3 mL Intravenous PRN Athena Masse, MD          Allergies: Allergies  Allergen Reactions  . Metformin And Related Diarrhea    Intolerance due to naturally loose bowels      Past Medical History: Past Medical History:  Diagnosis Date  . Anemia, normocytic normochromic   . BPH (benign prostatic hyperplasia)   . CAD (coronary artery disease)   . Cataracts, bilateral   . Chronic renal insufficiency   . Colon polyp   . COPD (chronic obstructive pulmonary disease) ([place])   . Diabetes mellitus without complication ([place])   . DVT (deep venous thrombosis) ([place])   . Edema of both legs   . Glaucoma   . Gout   . Heart murmur   . Hyperlipidemia   . Hypertension   . Moderate persistent asthma without complication   . Myocardial infarction ([place])   . Neuromuscular disorder ([place])   . Neuropathy   . Peripheral vascular disease ([place])   . Premature ejaculation   . Prostate cancer ([place])   . RLS (restless legs syndrome)   . Sleep apnea   . Stented coronary artery 11/13/2015  . Uncontrolled type 2 diabetes mellitus with stage 3 chronic kidney disease, with long-term current use of insulin ([place])      Past Surgical History: Past Surgical History:  Procedure Laterality Date  . APPENDECTOMY    . [place]  2009  . COLONOSCOPY  2012  . EYE SURGERY    .  PROSTATE SURGERY    . TONSILLECTOMY       Family History: History reviewed. No pertinent family history.   Social History: Social History   Socioeconomic History  .  Marital status: Widowed    Spouse name: Not on file  . Number of children: Not on file  . Years of education: Not on file  . Highest education level: Not on file  Occupational History  . Not on file  Tobacco Use  . Smoking status: Former Research scientist (life sciences)  . Smokeless tobacco: Never Used  Substance and Sexual Activity  . Alcohol use: Not on file  . Drug use: Not on file  . Sexual activity: Not on file  Other Topics Concern  . Not on file  Social History Narrative  . Not on file   Social Determinants of Health   Financial Resource Strain:   . Difficulty of Paying Living Expenses: Not on file  Food Insecurity:   . Worried About Charity fundraiser in the Last Year: Not on file  . Ran Out of Food in the Last Year: Not on file  Transportation Needs:   . Lack of Transportation (Medical): Not on file  . Lack of Transportation (Non-Medical): Not on file  Physical Activity:   . Days of Exercise per Week: Not on file  . Minutes of Exercise per Session: Not on file  Stress:   . Feeling of Stress : Not on file  Social Connections:   . Frequency of Communication with Friends and Family: Not on file  . Frequency of Social Gatherings with Friends and Family: Not on file  . Attends Religious Services: Not on file  . Active Member of Clubs or Organizations: Not on file  . Attends Archivist Meetings: Not on file  . Marital Status: Not on file  Intimate Partner Violence:   . Fear of Current or Ex-Partner: Not on file  . Emotionally Abused: Not on file  . Physically Abused: Not on file  . Sexually Abused: Not on file     Review of Systems: Review of Systems  Constitutional: Positive for malaise/fatigue. Negative for chills, diaphoresis, fever and weight loss.  HENT: Negative.  Negative for congestion, ear discharge, ear pain, hearing loss, nosebleeds, sinus pain, sore throat and tinnitus.   Eyes: Negative.  Negative for blurred vision, double vision, photophobia, pain, discharge and  redness.  Respiratory: Positive for cough, shortness of breath and wheezing. Negative for hemoptysis, sputum production and stridor.   Cardiovascular: Positive for orthopnea, claudication, leg swelling and PND. Negative for chest pain and palpitations.  Gastrointestinal: Negative.  Negative for abdominal pain, blood in stool, constipation, diarrhea, heartburn, melena, nausea and vomiting.  Genitourinary: Negative.  Negative for dysuria, flank pain, frequency, hematuria and urgency.  Musculoskeletal: Negative.  Negative for back pain, falls, joint pain, myalgias and neck pain.  Skin: Negative.  Negative for itching and rash.  Neurological: Positive for weakness. Negative for dizziness, tingling, tremors, sensory change, speech change, focal weakness, seizures, loss of consciousness and headaches.  Endo/Heme/Allergies: Negative.  Negative for environmental allergies and polydipsia. Does not bruise/bleed easily.  Psychiatric/Behavioral: Negative.  Negative for depression, hallucinations, memory loss, substance abuse and suicidal ideas. The patient is not nervous/anxious and does not have insomnia.     Vital Signs: Blood pressure (!) 125/56, pulse 67, temperature 98.4 F (36.9 C), temperature source Oral, resp. rate 19, height 5\' 11"  (1.803 m), weight 129.5 kg, SpO2 100 %.  Weight trends: [employer]  10/14/19 2316 10/15/19 1415 10/16/19 0410  Weight: 124.7 kg 129.7 kg 129.5 kg    Physical Exam: General: NAD, laying in bed  Head: Normocephalic, atraumatic. Moist oral mucosal membranes  Eyes: Anicteric, PERRL  Neck: Supple, trachea midline  Lungs:  Bilateral crackles and wheezing  Heart: Regular rate and rhythm  Abdomen:  Soft, nontender,   Extremities:  ++ peripheral edema.  Neurologic: Nonfocal, moving all four extremities  Skin: No lesions         Lab results: Basic Metabolic Panel: Recent Labs  Lab 10/14/19 2338 10/15/19 1336 10/16/19 0629  NA 133* 130* 130*  K 4.4 4.5  4.0  CL 94* 91* 91*  CO2 27 26 26   GLUCOSE 79 223* 224*  BUN 23 27* 31*  CREATININE 2.04* 2.12* 2.02*  CALCIUM 9.4 8.8* 8.5*  PHOS  --   --  4.3    Liver Function Tests: Recent Labs  Lab 10/14/19 2338 10/16/19 0629  AST 93*  --   ALT 86*  --   ALKPHOS 196*  --   BILITOT 0.7  --   PROT 7.5  --   ALBUMIN 3.2* 2.7*   No results for input(s): LIPASE, AMYLASE in the last 168 hours. No results for input(s): AMMONIA in the last 168 hours.  CBC: Recent Labs  Lab 10/14/19 2338 10/15/19 1336 10/16/19 0629  WBC 10.9* 12.6* 12.3*  HGB 10.2* 10.0* 9.3*  HCT 30.6* 30.9* 27.8*  MCV 81.0 84.2 80.6  PLT 356 299 307    Cardiac Enzymes: No results for input(s): CKTOTAL, CKMB, CKMBINDEX, TROPONINI in the last 168 hours.  BNP: Invalid input(s): POCBNP  CBG: Recent Labs  Lab 10/15/19 0851 10/15/19 1046 10/15/19 1654 10/15/19 2057 10/16/19 0731  GLUCAP 48* 159* 227* 175* 191*    Microbiology: Results for orders placed or performed during the hospital encounter of 10/14/19  SARS CORONAVIRUS 2 (TAT 6-24 HRS) Nasopharyngeal Nasopharyngeal Swab     Status: None   Collection Time: 10/15/19  1:35 AM   Specimen: Nasopharyngeal Swab  Result Value Ref Range Status   SARS Coronavirus 2 NEGATIVE NEGATIVE Final    Comment: (NOTE) SARS-CoV-2 target nucleic acids are NOT DETECTED. The SARS-CoV-2 RNA is generally detectable in upper and lower respiratory specimens during the acute phase of infection. Negative results do not preclude SARS-CoV-2 infection, do not rule out co-infections with other pathogens, and should not be used as the sole basis for treatment or other patient management decisions. Negative results must be combined with clinical observations, patient history, and epidemiological information. The expected result is Negative. Fact Sheet for Patients: SugarRoll.be Fact Sheet for Healthcare  Providers: https://www.woods-mathews.com/ This test is not yet approved or cleared by the [place] FDA and  has been authorized for detection and/or diagnosis of SARS-CoV-2 by FDA under an Emergency Use Authorization (EUA). This EUA will remain  in effect (meaning this test can be used) for the duration of the COVID-19 declaration under Section 56 4(b)(1) of the Act, 21 U.S.C. section 360bbb-3(b)(1), unless the authorization is terminated or revoked sooner. Performed at [facility], [place] [address], [place], [place] [address]     Coagulation Studies: No results for input(s): LABPROT, INR in the last 72 hours.  Urinalysis: No results for input(s): COLORURINE, LABSPEC, PHURINE, GLUCOSEU, HGBUR, BILIRUBINUR, KETONESUR, PROTEINUR, UROBILINOGEN, NITRITE, LEUKOCYTESUR in the last 72 hours.  Invalid input(s): APPERANCEUR    Imaging: CT HEAD WO CONTRAST  Result Date: 10/15/2019 CLINICAL DATA:  Head trauma EXAM: CT  HEAD WITHOUT CONTRAST TECHNIQUE: Contiguous axial images were obtained from the base of the skull through the vertex without intravenous contrast. COMPARISON:  None. FINDINGS: Brain: No evidence of acute territorial infarction, hemorrhage, hydrocephalus,extra-axial collection or mass lesion/mass effect. There is dilatation the ventricles and sulci consistent with age-related atrophy. Low-attenuation changes in the deep white matter consistent with small vessel ischemia. Vascular: No hyperdense vessel or unexpected calcification. Skull: The skull is intact. No fracture or focal lesion identified. Sinuses/Orbits: The visualized paranasal sinuses and mastoid air cells are clear. The orbits and globes intact. Other: None IMPRESSION: No acute intracranial abnormality. Findings consistent with age related atrophy and chronic small vessel ischemia Electronically Signed   By: Prudencio Pair M.D.   On: 10/15/2019 03:49   US RENAL  Result Date: 10/16/2019 CLINICAL DATA:   Acute renal failure, chronic kidney disease stage 3. EXAM: RENAL / URINARY TRACT ULTRASOUND COMPLETE COMPARISON:  None. FINDINGS: Right Kidney: Renal measurements: 10.1 x 6.3 x 6.4 cm = volume: 215 mL. Mild increased cortical echogenicity. No mass or hydronephrosis visualized. 6.3 cm cyst over the mid pole. Left Kidney: Renal measurements: 9.5 x 6.1 x 6.3 cm = volume: 193 mL. Mild increased cortical echogenicity. No mass or hydronephrosis visualized. Bladder: Appears normal for degree of bladder distention. Other: None. IMPRESSION: 1. Normal size kidneys without hydronephrosis. Mild increased cortical echogenicity which can be seen with medical renal disease. 2.  6.3 cm right renal cyst. Electronically Signed   By: [name] M.D.   On: 10/16/2019 09:17   DG Chest Portable 1 View  Result Date: 10/15/2019 CLINICAL DATA:  Shortness of breath, wheezing, positive CHF EXAM: PORTABLE CHEST 1 VIEW COMPARISON:  Radiograph 12/18/2018 FINDINGS: Hazy interstitial opacities more pronounced towards the lung bases with some bandlike areas of opacity likely reflecting superimposed subsegmental atelectasis and/or scarring. Mild cephalization of the pulmonary vascularity with some fissural thickening. Cardiomegaly is similar to comparison accounting for differences in technique. No visible effusion or pneumothorax. No acute osseous or soft tissue abnormality. Telemetry leads overlie the chest. IMPRESSION: Features compatible with CHF including cardiomegaly and mild interstitial edema. More bandlike opacities likely reflect subsegmental atelectasis or scarring. Electronically Signed   By: Lovena Le M.D.   On: 10/15/2019 00:06   ECHOCARDIOGRAM COMPLETE  Result Date: 10/15/2019   ECHOCARDIOGRAM REPORT   Patient Name:   [name] Date of Exam: 10/15/2019 Medical Rec #:  [id number]       Height:       71.0 in Accession #:    [id number]      Weight:       275.0 lb Date of Birth:  [birth year]       BSA:          2.41 m  Patient Age:    27 years        BP:           140/60 mmHg Patient Gender: M               HR:           67 bpm. Exam Location:  [facility] Procedure: 2D Echo, Color Doppler and Cardiac Doppler Indications:     I50.9 Congestive Heart Failure  History:         Patient has no prior history of Echocardiogram examinations.                  CAD and Previous Myocardial Infarction, PVD and COPD; Risk  Factors:Diabetes, Hypertension and Dyslipidemia.  Sonographer:     Charmayne Sheer RDCS (AE) Referring Phys:  [id number] Athena Masse Diagnosing Phys: Ida Rogue MD  Sonographer Comments: Suboptimal parasternal window and no subcostal window. Pt moving throughout study due to SOB and Image acquisition challenging due to COPD. IMPRESSIONS  1. Left ventricular ejection fraction, by visual estimation, is 60 to 65%. The left ventricle has normal function. There is no left ventricular hypertrophy.  2. Left ventricular diastolic parameters are consistent with Grade I diastolic dysfunction (impaired relaxation).  3. The left ventricle has no regional wall motion abnormalities.  4. Global right ventricle has normal systolic function.The right ventricular size is normal. No increase in right ventricular wall thickness.  5. Left atrial size was mildly dilated.  6. Moderately elevated pulmonary artery systolic pressure.  7. The tricuspid regurgitant velocity is 3.12 m/s, and with an assumed right atrial pressure of 10 mmHg, the estimated right ventricular systolic pressure is moderately elevated at 48.9 mmHg. FINDINGS  Left Ventricle: Left ventricular ejection fraction, by visual estimation, is 60 to 65%. The left ventricle has normal function. The left ventricle has no regional wall motion abnormalities. There is no left ventricular hypertrophy. Left ventricular diastolic parameters are consistent with Grade I diastolic dysfunction (impaired relaxation). Normal left atrial pressure. Right Ventricle: The right ventricular size is  normal. No increase in right ventricular wall thickness. Global RV systolic function is has normal systolic function. The tricuspid regurgitant velocity is 3.12 m/s, and with an assumed right atrial pressure  of 10 mmHg, the estimated right ventricular systolic pressure is moderately elevated at 48.9 mmHg. Left Atrium: Left atrial size was mildly dilated. Right Atrium: Right atrial size was normal in size Pericardium: There is no evidence of pericardial effusion. Mitral Valve: The mitral valve is normal in structure. Mild mitral valve regurgitation. No evidence of mitral valve stenosis by observation. MV peak gradient, 4.6 mmHg. Tricuspid Valve: The tricuspid valve is normal in structure. Tricuspid valve regurgitation is not demonstrated. Aortic Valve: The aortic valve was not well visualized. Aortic valve regurgitation is not visualized. Mild aortic valve sclerosis is present, with no evidence of aortic valve stenosis. Aortic valve mean gradient measures 7.0 mmHg. Aortic valve peak gradient measures 11.6 mmHg. Aortic valve area, by VTI measures 3.20 cm. Pulmonic Valve: The pulmonic valve was normal in structure. Pulmonic valve regurgitation is not visualized. Pulmonic regurgitation is not visualized. Aorta: The aortic root, ascending aorta and aortic arch are all structurally normal, with no evidence of dilitation or obstruction. Venous: The inferior vena cava is normal in size with greater than 50% respiratory variability, suggesting right atrial pressure of 3 mmHg. IAS/Shunts: No atrial level shunt detected by color flow Doppler. There is no evidence of a patent foramen ovale. No ventricular septal defect is seen or detected. There is no evidence of an atrial septal defect.  LEFT VENTRICLE PLAX 2D LVIDd:         4.58 cm  Diastology LVIDs:         3.28 cm  LV e' lateral:   9.36 cm/s LV PW:         1.15 cm  LV E/e' lateral: 9.2 LV IVS:        0.82 cm  LV e' medial:    6.64 cm/s LVOT diam:     2.40 cm  LV E/e'  medial:  13.0 LV SV:         53 ml LV SV Index:   20.73 LVOT  Area:     4.52 cm  RIGHT VENTRICLE RV Basal diam:  3.04 cm LEFT ATRIUM             Index       RIGHT ATRIUM           Index LA diam:        4.40 cm 1.82 cm/m  RA Area:     12.60 cm LA Vol (A2C):   62.9 ml 26.05 ml/m RA Volume:   27.60 ml  11.43 ml/m LA Vol (A4C):   52.2 ml 21.62 ml/m LA Biplane Vol: 57.9 ml 23.98 ml/m  AORTIC VALVE                    PULMONIC VALVE AV Area (Vmax):    3.41 cm     PV Vmax:       1.04 m/s AV Area (Vmean):   3.30 cm     PV Vmean:      71.000 cm/s AV Area (VTI):     3.20 cm     PV VTI:        0.264 m AV Vmax:           170.00 cm/s  PV Peak grad:  4.3 mmHg AV Vmean:          125.000 cm/s PV Mean grad:  2.0 mmHg AV VTI:            0.401 m AV Peak Grad:      11.6 mmHg AV Mean Grad:      7.0 mmHg LVOT Vmax:         128.00 cm/s LVOT Vmean:        91.100 cm/s LVOT VTI:          0.284 m LVOT/AV VTI ratio: 0.71  AORTA Ao Root diam: 3.30 cm MITRAL VALVE                        TRICUSPID VALVE MV Area (PHT): 3.53 cm             TR Peak grad:   38.9 mmHg MV Peak grad:  4.6 mmHg             TR Vmax:        312.00 cm/s MV Mean grad:  2.0 mmHg MV Vmax:       1.07 m/s             SHUNTS MV Vmean:      68.9 cm/s            Systemic VTI:  0.28 m MV VTI:        0.38 m               Systemic Diam: 2.40 cm MV PHT:        62.35 msec MV Decel Time: 215 msec MV E velocity: 86.30 cm/s 103 cm/s MV A velocity: 94.10 cm/s 70.3 cm/s MV E/A ratio:  0.92       1.5  Ida Rogue MD Electronically signed by Ida Rogue MD Signature Date/Time: 10/15/2019/3:57:33 PM    Final       Assessment & Plan: [name] is a 85 y.o. black male with congestive heart failure, hypertension, diabetes mellitus type II insulin dependent, diabetic neuropathy, coronary artery disease, peripheral vascular disease, COPD, gout, sleep apnea, DVT on apixaban and prostate cancer, who was admitted to [facility] on 10/14/2019 for Elevated LFTs [R79.89] Acute  kidney injury ([place]) [N17.9] Acute on chronic diastolic CHF (congestive heart failure) ([facility]) [I50.33] Acute renal failure, unspecified acute renal failure type ([place]) [N17.9] Acute on chronic congestive heart failure, unspecified heart failure type ([place]) [I50.9]  1. Acute renal failure on chronic kidney disease stage IIIA with proteinuria and hematuria.  Baseline creatinine of 1.4, GFR of 55 on 07/09/19 Chronic kidney disease secondary to diabetic nephropathy Acute renal failure secondary to acute cardiorenal syndrome Renal ultrasound with no obstruction.  No IV contrast exposure.  Not on an ACE-I/ARB at home.  - Pending urine studies.   2. Hypertension: with acute exacerbation of diastolic congestive heart failure - IV furosemide 40mg  IV bid - carvedilol.   3. Hyponatremia: secondary to CHF and volume overload.   4. Anemia with chronic kidney disease: hemoglobin 9.3. Normocytic.   5. Anasarca with hypoalbuminemia - add IV albumin   6. Diabetes mellitus type II with chronic kidney disease: insulin dependent. Not well controlled. Hemoglobin A1c of 8.1%.     LOS: 1 Denni France 12/27/202010:04 AM

## 2019-10-16 NOTE — Progress Notes (Signed)
Patient ID: [name], male   DOB: Sep 25, 1932, 85 y.o.   MRN: [id number] PROGRESS NOTE    Manan Olmo  IRW:[id number] DOB: [birth year] DOA: 10/14/2019 PCP: [name], PA-C    Brief Narrative:  Tavoris Brisk is a 85 y.o. obese male lives at home with his daughter ambulate with a cane, with medical history significant for congestive heart failure, CKD 3, insulin-dependent type 2 diabetes, coronary artery disease, chronic anticoagulation secondary to history of DVT and history of frequent falls was brought to the emergency room because of generalized weakness,    Consultants:   None  Procedures: Echo  Antimicrobials:   None   Subjective: Still with shortness of breath and some coughing.  No new complaints   Objective: Vitals:   10/15/19 1928 10/16/19 0410 10/16/19 0734 10/16/19 1647  BP: 124/70 126/62 (!) 125/56 (!) 130/47  Pulse: 78 68 67 72  Resp: 20 19 19 18   Temp: 98.6 F (37 C) 98.9 F (37.2 C) 98.4 F (36.9 C) 97.6 F (36.4 C)  TempSrc: Oral Oral Oral Oral  SpO2: 100% 98% 100% 100%  Weight:  129.5 kg    Height:        Intake/Output Summary (Last 24 hours) at 10/16/2019 1712 Last data filed at 10/16/2019 0902 Gross per 24 hour  Intake 3 ml  Output 800 ml  Net -797 ml   Filed Weights   10/14/19 2316 10/15/19 1415 10/16/19 0410  Weight: 124.7 kg 129.7 kg 129.5 kg    Examination:  General exam: Appears calm and comfortable , NAD, obese  Bedside Respiratory system: mild crackles scattered, no wheezing or rhonchi Cardiovascular system: S1 & S2 heard, RRR.  No murmurs, rubs, gallops or clicks.  Gastrointestinal system: Abdomen obese ,nondistended, soft and nontender obese normal bowel sounds heard.  No rebound Central nervous system: Alert and oriented x3.  Grossly intact Extremities 2+ edema bilaterally with chronic skin changes. Skin: Warm dry Psychiatry: Judgement and insight appear normal. Mood & affect appropriate.     Data  Reviewed: I have personally reviewed following labs and imaging studies  CBC: Recent Labs  Lab 10/14/19 2338 10/15/19 1336 10/16/19 0629  WBC 10.9* 12.6* 12.3*  HGB 10.2* 10.0* 9.3*  HCT 30.6* 30.9* 27.8*  MCV 81.0 84.2 80.6  PLT 356 299 761   Basic Metabolic Panel: Recent Labs  Lab 10/14/19 2338 10/15/19 1336 10/16/19 0629  NA 133* 130* 130*  K 4.4 4.5 4.0  CL 94* 91* 91*  CO2 27 26 26   GLUCOSE 79 223* 224*  BUN 23 27* 31*  CREATININE 2.04* 2.12* 2.02*  CALCIUM 9.4 8.8* 8.5*  PHOS  --   --  4.3   GFR: Estimated Creatinine Clearance: 36.7 mL/min (A) (by C-G formula based on SCr of 2.02 mg/dL (H)). Liver Function Tests: Recent Labs  Lab 10/14/19 2338 10/16/19 0629  AST 93*  --   ALT 86*  --   ALKPHOS 196*  --   BILITOT 0.7  --   PROT 7.5  --   ALBUMIN 3.2* 2.7*   No results for input(s): LIPASE, AMYLASE in the last 168 hours. No results for input(s): AMMONIA in the last 168 hours. Coagulation Profile: No results for input(s): INR, PROTIME in the last 168 hours. Cardiac Enzymes: No results for input(s): CKTOTAL, CKMB, CKMBINDEX, TROPONINI in the last 168 hours. BNP (last 3 results) No results for input(s): PROBNP in the last 8760 hours. HbA1C: Recent Labs    10/15/19 1336  HGBA1C 8.1*  CBG: Recent Labs  Lab 10/15/19 1654 10/15/19 2057 10/16/19 0731 10/16/19 1110 10/16/19 1648  GLUCAP 227* 175* 191* 276* 241*   Lipid Profile: No results for input(s): CHOL, HDL, LDLCALC, TRIG, CHOLHDL, LDLDIRECT in the last 72 hours. Thyroid Function Tests: No results for input(s): TSH, T4TOTAL, FREET4, T3FREE, THYROIDAB in the last 72 hours. Anemia Panel: No results for input(s): VITAMINB12, FOLATE, FERRITIN, TIBC, IRON, RETICCTPCT in the last 72 hours. Sepsis Labs: No results for input(s): PROCALCITON, LATICACIDVEN in the last 168 hours.  Recent Results (from the past 240 hour(s))  SARS CORONAVIRUS 2 (TAT 6-24 HRS) Nasopharyngeal Nasopharyngeal Swab      Status: None   Collection Time: 10/15/19  1:35 AM   Specimen: Nasopharyngeal Swab  Result Value Ref Range Status   SARS Coronavirus 2 NEGATIVE NEGATIVE Final    Comment: (NOTE) SARS-CoV-2 target nucleic acids are NOT DETECTED. The SARS-CoV-2 RNA is generally detectable in upper and lower respiratory specimens during the acute phase of infection. Negative results do not preclude SARS-CoV-2 infection, do not rule out co-infections with other pathogens, and should not be used as the sole basis for treatment or other patient management decisions. Negative results must be combined with clinical observations, patient history, and epidemiological information. The expected result is Negative. Fact Sheet for Patients: SugarRoll.be Fact Sheet for Healthcare Providers: https://www.woods-mathews.com/ This test is not yet approved or cleared by the [place] FDA and  has been authorized for detection and/or diagnosis of SARS-CoV-2 by FDA under an Emergency Use Authorization (EUA). This EUA will remain  in effect (meaning this test can be used) for the duration of the COVID-19 declaration under Section 56 4(b)(1) of the Act, 21 U.S.C. section 360bbb-3(b)(1), unless the authorization is terminated or revoked sooner. Performed at [facility], [place] [address], [place], [place] [address]          Radiology Studies: CT HEAD WO CONTRAST  Result Date: 10/15/2019 CLINICAL DATA:  Head trauma EXAM: CT HEAD WITHOUT CONTRAST TECHNIQUE: Contiguous axial images were obtained from the base of the skull through the vertex without intravenous contrast. COMPARISON:  None. FINDINGS: Brain: No evidence of acute territorial infarction, hemorrhage, hydrocephalus,extra-axial collection or mass lesion/mass effect. There is dilatation the ventricles and sulci consistent with age-related atrophy. Low-attenuation changes in the deep white matter consistent with small  vessel ischemia. Vascular: No hyperdense vessel or unexpected calcification. Skull: The skull is intact. No fracture or focal lesion identified. Sinuses/Orbits: The visualized paranasal sinuses and mastoid air cells are clear. The orbits and globes intact. Other: None IMPRESSION: No acute intracranial abnormality. Findings consistent with age related atrophy and chronic small vessel ischemia Electronically Signed   By: Prudencio Pair M.D.   On: 10/15/2019 03:49   US RENAL  Result Date: 10/16/2019 CLINICAL DATA:  Acute renal failure, chronic kidney disease stage 3. EXAM: RENAL / URINARY TRACT ULTRASOUND COMPLETE COMPARISON:  None. FINDINGS: Right Kidney: Renal measurements: 10.1 x 6.3 x 6.4 cm = volume: 215 mL. Mild increased cortical echogenicity. No mass or hydronephrosis visualized. 6.3 cm cyst over the mid pole. Left Kidney: Renal measurements: 9.5 x 6.1 x 6.3 cm = volume: 193 mL. Mild increased cortical echogenicity. No mass or hydronephrosis visualized. Bladder: Appears normal for degree of bladder distention. Other: None. IMPRESSION: 1. Normal size kidneys without hydronephrosis. Mild increased cortical echogenicity which can be seen with medical renal disease. 2.  6.3 cm right renal cyst. Electronically Signed   By: [name] M.D.   On:  10/16/2019 09:17   DG Chest Portable 1 View  Result Date: 10/15/2019 CLINICAL DATA:  Shortness of breath, wheezing, positive CHF EXAM: PORTABLE CHEST 1 VIEW COMPARISON:  Radiograph 12/18/2018 FINDINGS: Hazy interstitial opacities more pronounced towards the lung bases with some bandlike areas of opacity likely reflecting superimposed subsegmental atelectasis and/or scarring. Mild cephalization of the pulmonary vascularity with some fissural thickening. Cardiomegaly is similar to comparison accounting for differences in technique. No visible effusion or pneumothorax. No acute osseous or soft tissue abnormality. Telemetry leads overlie the chest. IMPRESSION: Features  compatible with CHF including cardiomegaly and mild interstitial edema. More bandlike opacities likely reflect subsegmental atelectasis or scarring. Electronically Signed   By: Lovena Le M.D.   On: 10/15/2019 00:06   ECHOCARDIOGRAM COMPLETE  Result Date: 10/15/2019   ECHOCARDIOGRAM REPORT   Patient Name:   PROPHET RENWICK Date of Exam: 10/15/2019 Medical Rec #:  [id number]       Height:       71.0 in Accession #:    [id number]      Weight:       275.0 lb Date of Birth:  1931-11-06       BSA:          2.41 m Patient Age:    54 years        BP:           140/60 mmHg Patient Gender: M               HR:           67 bpm. Exam Location:  [facility] Procedure: 2D Echo, Color Doppler and Cardiac Doppler Indications:     I50.9 Congestive Heart Failure  History:         Patient has no prior history of Echocardiogram examinations.                  CAD and Previous Myocardial Infarction, PVD and COPD; Risk                  Factors:Diabetes, Hypertension and Dyslipidemia.  Sonographer:     Charmayne Sheer RDCS (AE) Referring Phys:  [id number] Athena Masse Diagnosing Phys: Ida Rogue MD  Sonographer Comments: Suboptimal parasternal window and no subcostal window. Pt moving throughout study due to SOB and Image acquisition challenging due to COPD. IMPRESSIONS  1. Left ventricular ejection fraction, by visual estimation, is 60 to 65%. The left ventricle has normal function. There is no left ventricular hypertrophy.  2. Left ventricular diastolic parameters are consistent with Grade I diastolic dysfunction (impaired relaxation).  3. The left ventricle has no regional wall motion abnormalities.  4. Global right ventricle has normal systolic function.The right ventricular size is normal. No increase in right ventricular wall thickness.  5. Left atrial size was mildly dilated.  6. Moderately elevated pulmonary artery systolic pressure.  7. The tricuspid regurgitant velocity is 3.12 m/s, and with an assumed right atrial pressure of 10  mmHg, the estimated right ventricular systolic pressure is moderately elevated at 48.9 mmHg. FINDINGS  Left Ventricle: Left ventricular ejection fraction, by visual estimation, is 60 to 65%. The left ventricle has normal function. The left ventricle has no regional wall motion abnormalities. There is no left ventricular hypertrophy. Left ventricular diastolic parameters are consistent with Grade I diastolic dysfunction (impaired relaxation). Normal left atrial pressure. Right Ventricle: The right ventricular size is normal. No increase in right ventricular wall thickness. Global RV systolic function is has normal systolic function.  The tricuspid regurgitant velocity is 3.12 m/s, and with an assumed right atrial pressure  of 10 mmHg, the estimated right ventricular systolic pressure is moderately elevated at 48.9 mmHg. Left Atrium: Left atrial size was mildly dilated. Right Atrium: Right atrial size was normal in size Pericardium: There is no evidence of pericardial effusion. Mitral Valve: The mitral valve is normal in structure. Mild mitral valve regurgitation. No evidence of mitral valve stenosis by observation. MV peak gradient, 4.6 mmHg. Tricuspid Valve: The tricuspid valve is normal in structure. Tricuspid valve regurgitation is not demonstrated. Aortic Valve: The aortic valve was not well visualized. Aortic valve regurgitation is not visualized. Mild aortic valve sclerosis is present, with no evidence of aortic valve stenosis. Aortic valve mean gradient measures 7.0 mmHg. Aortic valve peak gradient measures 11.6 mmHg. Aortic valve area, by VTI measures 3.20 cm. Pulmonic Valve: The pulmonic valve was normal in structure. Pulmonic valve regurgitation is not visualized. Pulmonic regurgitation is not visualized. Aorta: The aortic root, ascending aorta and aortic arch are all structurally normal, with no evidence of dilitation or obstruction. Venous: The inferior vena cava is normal in size with greater than 50%  respiratory variability, suggesting right atrial pressure of 3 mmHg. IAS/Shunts: No atrial level shunt detected by color flow Doppler. There is no evidence of a patent foramen ovale. No ventricular septal defect is seen or detected. There is no evidence of an atrial septal defect.  LEFT VENTRICLE PLAX 2D LVIDd:         4.58 cm  Diastology LVIDs:         3.28 cm  LV e' lateral:   9.36 cm/s LV PW:         1.15 cm  LV E/e' lateral: 9.2 LV IVS:        0.82 cm  LV e' medial:    6.64 cm/s LVOT diam:     2.40 cm  LV E/e' medial:  13.0 LV SV:         53 ml LV SV Index:   20.73 LVOT Area:     4.52 cm  RIGHT VENTRICLE RV Basal diam:  3.04 cm LEFT ATRIUM             Index       RIGHT ATRIUM           Index LA diam:        4.40 cm 1.82 cm/m  RA Area:     12.60 cm LA Vol (A2C):   62.9 ml 26.05 ml/m RA Volume:   27.60 ml  11.43 ml/m LA Vol (A4C):   52.2 ml 21.62 ml/m LA Biplane Vol: 57.9 ml 23.98 ml/m  AORTIC VALVE                    PULMONIC VALVE AV Area (Vmax):    3.41 cm     PV Vmax:       1.04 m/s AV Area (Vmean):   3.30 cm     PV Vmean:      71.000 cm/s AV Area (VTI):     3.20 cm     PV VTI:        0.264 m AV Vmax:           170.00 cm/s  PV Peak grad:  4.3 mmHg AV Vmean:          125.000 cm/s PV Mean grad:  2.0 mmHg AV VTI:  0.401 m AV Peak Grad:      11.6 mmHg AV Mean Grad:      7.0 mmHg LVOT Vmax:         128.00 cm/s LVOT Vmean:        91.100 cm/s LVOT VTI:          0.284 m LVOT/AV VTI ratio: 0.71  AORTA Ao Root diam: 3.30 cm MITRAL VALVE                        TRICUSPID VALVE MV Area (PHT): 3.53 cm             TR Peak grad:   38.9 mmHg MV Peak grad:  4.6 mmHg             TR Vmax:        312.00 cm/s MV Mean grad:  2.0 mmHg MV Vmax:       1.07 m/s             SHUNTS MV Vmean:      68.9 cm/s            Systemic VTI:  0.28 m MV VTI:        0.38 m               Systemic Diam: 2.40 cm MV PHT:        62.35 msec MV Decel Time: 215 msec MV E velocity: 86.30 cm/s 103 cm/s MV A velocity: 94.10 cm/s 70.3 cm/s MV  E/A ratio:  0.92       1.5  Ida Rogue MD Electronically signed by Ida Rogue MD Signature Date/Time: 10/15/2019/3:57:33 PM    Final         Scheduled Meds: . apixaban  5 mg Oral BID  . aspirin EC  81 mg Oral Daily  . atorvastatin  80 mg Oral q1800  . carvedilol  25 mg Oral BID WC  . clopidogrel  75 mg Oral Daily  . famotidine  20 mg Oral Daily  . finasteride  5 mg Oral Daily  . furosemide  40 mg Intravenous BID  . furosemide  40 mg Intravenous Once  . insulin aspart  0-15 Units Subcutaneous TID WC  . insulin detemir  30 Units Subcutaneous Daily  . sodium chloride flush  3 mL Intravenous Q12H   Continuous Infusions: . sodium chloride    . albumin human 25 g (10/16/19 1056)    Assessment & Plan:   Principal Problem:   Acute exacerbation of CHF (congestive heart failure) (HCC) Active Problems:   Anasarca   CAD (coronary artery disease)   Insulin dependent type 2 diabetes mellitus (HCC)   Essential hypertension   Chronic anticoagulation   Acute kidney injury superimposed on CKD (HCC)   CKD (chronic kidney disease) stage 3, GFR 30-59 ml/min   Generalized weakness   Frequent falls   Elevated liver enzymes   Acute on chronic diastolic CHF (congestive heart failure) (HCC)   Elevated LFTs  Acute exacerbation of CHF, suspect diastolic (congestive heart failure) ([place]) with  anasarca --BNP mildly elevated however patient is obese and this may be unreliable result  troponin negative x1 echo pending --Continue IV Lasix   Holding ACE inhibitors because of renal function --Daily weights salt restriction --Monitor input and output Continue Coreg covid negative.    Accidental fall with history of frequent falls --patient reports slipping and falling in the bathroom but has been falling over the past  few weeks --CT head negative. --Fall precautions -PT/OT consult    Elevated liver enzymes --Secondary  to hepatic congestion from heart failure  exacerbation --Continue to monitor.   Expect improvement as acute heart failure improves, if not will ck ruq [place]    CAD (coronary artery disease) --Denies complaints of chest pain --Continue home aspirin, Plavix Continue beta blk    Insulin dependent type 2 diabetes mellitus ([place]) --Continue basal insulin but at reduced dose and with supplemental insulin coverage --Follow-up A1c    Essential hypertension --Blood pressure controlled.  Continue amlodipine and beta blk   chronic anticoagulation secondary to DVT --New Eliquis    Acute kidney injury superimposed on CKD 3(HCC) --Creatinine elevated at 2 above baseline of 1.4 Nephrology input appreciated.  F/u renal US and urine studies. No iv contrast IV albumin per nephrology for hypoalbuminemia/anasarca     DVT prophylaxis: Eliquis Code Status: full Family Communication: none at bedside Disposition Plan: likely be here 2 MN stays as he is volume overloaded and needs to be more medically stable.       LOS: 1 day   Time spent: 45 minutes with more than 50% COC    Nolberto Hanlon, MD [facility] Pager [phone]  If 7PM-7AM, please contact night-coverage [email or web address] Password [facility] 10/16/2019, 5:12 PM

## 2019-10-16 NOTE — Plan of Care (Signed)
  Problem: Education: Goal: Ability to demonstrate management of disease process will improve Outcome: Progressing Goal: Ability to verbalize understanding of medication therapies will improve Outcome: Progressing   

## 2019-10-17 DIAGNOSIS — I509 Heart failure, unspecified: Secondary | ICD-10-CM

## 2019-10-17 LAB — COMPREHENSIVE METABOLIC PANEL
ALT: 71 U/L — ABNORMAL HIGH (ref 0–44)
AST: 43 U/L — ABNORMAL HIGH (ref 15–41)
Albumin: 3.2 g/dL — ABNORMAL LOW (ref 3.5–5.0)
Alkaline Phosphatase: 146 U/L — ABNORMAL HIGH (ref 38–126)
Anion gap: 12 (ref 5–15)
BUN: 32 mg/dL — ABNORMAL HIGH (ref 8–23)
CO2: 28 mmol/L (ref 22–32)
Calcium: 9 mg/dL (ref 8.9–10.3)
Chloride: 92 mmol/L — ABNORMAL LOW (ref 98–111)
Creatinine, Ser: 2.26 mg/dL — ABNORMAL HIGH (ref 0.61–1.24)
GFR calc Af Amer: 30 mL/min — ABNORMAL LOW (ref >60)
GFR calc non Af Amer: 25 mL/min — ABNORMAL LOW (ref >60)
Glucose, Bld: 120 mg/dL — ABNORMAL HIGH (ref 70–99)
Potassium: 3.6 mmol/L (ref 3.5–5.1)
Sodium: 132 mmol/L — ABNORMAL LOW (ref 135–145)
Total Bilirubin: 1.2 mg/dL (ref 0.3–1.2)
Total Protein: 6.9 g/dL (ref 6.5–8.1)

## 2019-10-17 LAB — GLUCOSE, CAPILLARY
Glucose-Capillary: 130 mg/dL — ABNORMAL HIGH (ref 70–99)
Glucose-Capillary: 201 mg/dL — ABNORMAL HIGH (ref 70–99)
Glucose-Capillary: 213 mg/dL — ABNORMAL HIGH (ref 70–99)

## 2019-10-17 LAB — HEPATITIS C ANTIBODY: HCV Ab: REACTIVE — AB

## 2019-10-17 LAB — HEPATITIS B SURFACE ANTIBODY,QUALITATIVE: Hep B S Ab: NONREACTIVE

## 2019-10-17 LAB — HEPATITIS B SURFACE ANTIGEN: Hepatitis B Surface Ag: NONREACTIVE

## 2019-10-17 LAB — HIV ANTIBODY (ROUTINE TESTING W REFLEX): HIV Screen 4th Generation wRfx: NONREACTIVE

## 2019-10-17 LAB — HEPATITIS B CORE ANTIBODY, IGM: Hep B C IgM: NONREACTIVE

## 2019-10-17 NOTE — Progress Notes (Signed)
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 Also does not like the food Reports loose stools (chronic) for last 1 year Also reports difficulty with voiding occasionally   Objective:  Vital signs in last 24 hours:  Temp:  [97.5 F (36.4 C)-98.3 F (36.8 C)] 97.5 F (36.4 C) (12/28 0912) Pulse Rate:  [67-74] 68 (12/28 0912) Resp:  [18] 18 (12/28 0453) BP: (130-142)/(47-57) 142/57 (12/28 0912) SpO2:  [98 %-100 %] 98 % (12/28 0912) Weight:  [126.7 kg] 126.7 kg (12/28 0453)  Weight change: -2.994 kg Filed Weights   10/15/19 1415 10/16/19 0410 10/17/19 0453  Weight: 129.7 kg 129.5 kg 126.7 kg    Intake/Output:    Intake/Output Summary (Last 24 hours) at 10/17/2019 1001 Last data filed at 10/17/2019 0900 Gross per 24 hour  Intake 540.85 ml  Output 0 ml  Net 540.85 ml     Physical Exam: General: NAD, laying in bed  HEENT Anicteric, moist oral mucus membranes  Pulm/lungs Coarse breath sounds, Room air, no crackles or wheezing  CVS/Heart Regular, no rub  Abdomen:  Soft, obese, Non tender  Extremities: + dependent edema  Neurologic: Alert, able to answer questions appropriately  Skin: Changes from chronic edema bilaterally          Basic Metabolic Panel:  Recent Labs  Lab 10/14/19 2338 10/15/19 1336 10/16/19 0629 10/17/19 0528  NA 133* 130* 130* 132*  K 4.4 4.5 4.0 3.6  CL 94* 91* 91* 92*  CO2 27 26 26 28   GLUCOSE 79 223* 224* 120*  BUN 23 27* 31* 32*  CREATININE 2.04* 2.12* 2.02* 2.26*  CALCIUM 9.4 8.8* 8.5* 9.0  PHOS  --   --  4.3  --      CBC: Recent Labs  Lab 10/14/19 2338 10/15/19 1336 10/16/19 0629  WBC 10.9* 12.6* 12.3*  HGB  10.2* 10.0* 9.3*  HCT 30.6* 30.9* 27.8*  MCV 81.0 84.2 80.6  PLT 356 299 307     No results found for: HEPBSAG, HEPBSAB, HEPBIGM    Microbiology:  Recent Results (from the past 240 hour(s))  SARS CORONAVIRUS 2 (TAT 6-24 HRS) Nasopharyngeal Nasopharyngeal Swab     Status: None   Collection Time: 10/15/19  1:35 AM   Specimen: Nasopharyngeal Swab  Result Value Ref Range Status   SARS Coronavirus 2 NEGATIVE NEGATIVE Final    Comment: (NOTE) SARS-CoV-2 target nucleic acids are NOT DETECTED. The SARS-CoV-2 RNA is generally detectable in upper and lower respiratory specimens during the acute phase of infection. Negative results do not preclude SARS-CoV-2 infection, do not rule out co-infections with other pathogens, and should not be used as the sole basis for treatment or other patient management decisions. Negative results must be combined with clinical observations, patient history, and epidemiological information. The expected result is Negative. Fact Sheet for Patients: SugarRoll.be Fact Sheet for Healthcare Providers: https://www.woods-mathews.com/ This test is not yet approved or cleared by the [place] FDA and  has been authorized for detection and/or diagnosis of SARS-CoV-2 by FDA under an Emergency Use Authorization (EUA). This EUA will remain  in effect (meaning this test can be used) for the duration of the  COVID-19 declaration under Section 56 4(b)(1) of the Act, 21 U.S.C. section 360bbb-3(b)(1), unless the authorization is terminated or revoked sooner. Performed at [facility], [place] [address], [place], [place] [address]     Coagulation Studies: No results for input(s): LABPROT, INR in the last 72 hours.  Urinalysis: No results for input(s): COLORURINE, LABSPEC, PHURINE, GLUCOSEU, HGBUR, BILIRUBINUR, KETONESUR, PROTEINUR, UROBILINOGEN, NITRITE, LEUKOCYTESUR in the last 72 hours.  Invalid input(s): APPERANCEUR     Imaging: US RENAL  Result Date: 10/16/2019 CLINICAL DATA:  Acute renal failure, chronic kidney disease stage 3. EXAM: RENAL / URINARY TRACT ULTRASOUND COMPLETE COMPARISON:  None. FINDINGS: Right Kidney: Renal measurements: 10.1 x 6.3 x 6.4 cm = volume: 215 mL. Mild increased cortical echogenicity. No mass or hydronephrosis visualized. 6.3 cm cyst over the mid pole. Left Kidney: Renal measurements: 9.5 x 6.1 x 6.3 cm = volume: 193 mL. Mild increased cortical echogenicity. No mass or hydronephrosis visualized. Bladder: Appears normal for degree of bladder distention. Other: None. IMPRESSION: 1. Normal size kidneys without hydronephrosis. Mild increased cortical echogenicity which can be seen with medical renal disease. 2.  6.3 cm right renal cyst. Electronically Signed   By: [name] M.D.   On: 10/16/2019 09:17   ECHOCARDIOGRAM COMPLETE  Result Date: 10/15/2019   ECHOCARDIOGRAM REPORT   Patient Name:   [name] Date of Exam: 10/15/2019 Medical Rec #:  [id number]       Height:       71.0 in Accession #:    [id number]      Weight:       275.0 lb Date of Birth:  [birth year]       BSA:          2.41 m Patient Age:    85 years        BP:           140/60 mmHg Patient Gender: M               HR:           67 bpm. Exam Location:  [facility] Procedure: 2D Echo, Color Doppler and Cardiac Doppler Indications:     I50.9 Congestive Heart Failure  History:         Patient has no prior history of Echocardiogram examinations.                  CAD and Previous Myocardial Infarction, PVD and COPD; Risk                  Factors:Diabetes, Hypertension and Dyslipidemia.  Sonographer:     Charmayne Sheer RDCS (AE) Referring Phys:  [id number] Athena Masse Diagnosing Phys: Ida Rogue MD  Sonographer Comments: Suboptimal parasternal window and no subcostal window. Pt moving throughout study due to SOB and Image acquisition challenging due to COPD. IMPRESSIONS  1. Left ventricular ejection fraction, by visual estimation, is  60 to 65%. The left ventricle has normal function. There is no left ventricular hypertrophy.  2. Left ventricular diastolic parameters are consistent with Grade I diastolic dysfunction (impaired relaxation).  3. The left ventricle has no regional wall motion abnormalities.  4. Global right ventricle has normal systolic function.The right ventricular size is normal. No increase in right ventricular wall thickness.  5. Left atrial size was mildly dilated.  6. Moderately elevated pulmonary artery systolic pressure.  7. The tricuspid regurgitant velocity is 3.12 m/s, and with an assumed right atrial pressure of 10 mmHg, the estimated  right ventricular systolic pressure is moderately elevated at 48.9 mmHg. FINDINGS  Left Ventricle: Left ventricular ejection fraction, by visual estimation, is 60 to 65%. The left ventricle has normal function. The left ventricle has no regional wall motion abnormalities. There is no left ventricular hypertrophy. Left ventricular diastolic parameters are consistent with Grade I diastolic dysfunction (impaired relaxation). Normal left atrial pressure. Right Ventricle: The right ventricular size is normal. No increase in right ventricular wall thickness. Global RV systolic function is has normal systolic function. The tricuspid regurgitant velocity is 3.12 m/s, and with an assumed right atrial pressure  of 10 mmHg, the estimated right ventricular systolic pressure is moderately elevated at 48.9 mmHg. Left Atrium: Left atrial size was mildly dilated. Right Atrium: Right atrial size was normal in size Pericardium: There is no evidence of pericardial effusion. Mitral Valve: The mitral valve is normal in structure. Mild mitral valve regurgitation. No evidence of mitral valve stenosis by observation. MV peak gradient, 4.6 mmHg. Tricuspid Valve: The tricuspid valve is normal in structure. Tricuspid valve regurgitation is not demonstrated. Aortic Valve: The aortic valve was not well visualized.  Aortic valve regurgitation is not visualized. Mild aortic valve sclerosis is present, with no evidence of aortic valve stenosis. Aortic valve mean gradient measures 7.0 mmHg. Aortic valve peak gradient measures 11.6 mmHg. Aortic valve area, by VTI measures 3.20 cm. Pulmonic Valve: The pulmonic valve was normal in structure. Pulmonic valve regurgitation is not visualized. Pulmonic regurgitation is not visualized. Aorta: The aortic root, ascending aorta and aortic arch are all structurally normal, with no evidence of dilitation or obstruction. Venous: The inferior vena cava is normal in size with greater than 50% respiratory variability, suggesting right atrial pressure of 3 mmHg. IAS/Shunts: No atrial level shunt detected by color flow Doppler. There is no evidence of a patent foramen ovale. No ventricular septal defect is seen or detected. There is no evidence of an atrial septal defect.  LEFT VENTRICLE PLAX 2D LVIDd:         4.58 cm  Diastology LVIDs:         3.28 cm  LV e' lateral:   9.36 cm/s LV PW:         1.15 cm  LV E/e' lateral: 9.2 LV IVS:        0.82 cm  LV e' medial:    6.64 cm/s LVOT diam:     2.40 cm  LV E/e' medial:  13.0 LV SV:         53 ml LV SV Index:   20.73 LVOT Area:     4.52 cm  RIGHT VENTRICLE RV Basal diam:  3.04 cm LEFT ATRIUM             Index       RIGHT ATRIUM           Index LA diam:        4.40 cm 1.82 cm/m  RA Area:     12.60 cm LA Vol (A2C):   62.9 ml 26.05 ml/m RA Volume:   27.60 ml  11.43 ml/m LA Vol (A4C):   52.2 ml 21.62 ml/m LA Biplane Vol: 57.9 ml 23.98 ml/m  AORTIC VALVE                    PULMONIC VALVE AV Area (Vmax):    3.41 cm     PV Vmax:       1.04 m/s AV Area (Vmean):   3.30 cm  PV Vmean:      71.000 cm/s AV Area (VTI):     3.20 cm     PV VTI:        0.264 m AV Vmax:           170.00 cm/s  PV Peak grad:  4.3 mmHg AV Vmean:          125.000 cm/s PV Mean grad:  2.0 mmHg AV VTI:            0.401 m AV Peak Grad:      11.6 mmHg AV Mean Grad:      7.0 mmHg LVOT  Vmax:         128.00 cm/s LVOT Vmean:        91.100 cm/s LVOT VTI:          0.284 m LVOT/AV VTI ratio: 0.71  AORTA Ao Root diam: 3.30 cm MITRAL VALVE                        TRICUSPID VALVE MV Area (PHT): 3.53 cm             TR Peak grad:   38.9 mmHg MV Peak grad:  4.6 mmHg             TR Vmax:        312.00 cm/s MV Mean grad:  2.0 mmHg MV Vmax:       1.07 m/s             SHUNTS MV Vmean:      68.9 cm/s            Systemic VTI:  0.28 m MV VTI:        0.38 m               Systemic Diam: 2.40 cm MV PHT:        62.35 msec MV Decel Time: 215 msec MV E velocity: 86.30 cm/s 103 cm/s MV A velocity: 94.10 cm/s 70.3 cm/s MV E/A ratio:  0.92       1.5  Ida Rogue MD Electronically signed by Ida Rogue MD Signature Date/Time: 10/15/2019/3:57:33 PM    Final      Medications:   . sodium chloride    . albumin human 25 g (10/17/19 0838)   . apixaban  5 mg Oral BID  . aspirin EC  81 mg Oral Daily  . atorvastatin  80 mg Oral q1800  . carvedilol  25 mg Oral BID WC  . famotidine  20 mg Oral Daily  . finasteride  5 mg Oral Daily  . furosemide  40 mg Intravenous BID  . furosemide  40 mg Intravenous Once  . insulin aspart  0-15 Units Subcutaneous TID WC  . insulin detemir  30 Units Subcutaneous Daily  . sodium chloride flush  3 mL Intravenous Q12H   sodium chloride, acetaminophen, nitroGLYCERIN, ondansetron (ZOFRAN) IV, sodium chloride flush  Assessment/ Plan:  85 y.o.African American male  With grade 1 diastolic dysfunction (echo 09/2019), Moderate pulm HTN, Diabetes type 2 (and insulin dependent),  CAD, PVD, Gout, OSA, h/o DVT requiring chronic anticoagulation, h/o elevated PSA (followed by Dr Darcus [facility] at [facility])  1. ARF on CKD st 3 A (Cr 1.4/GFR 53) on 07/09/2019 No recent iv contrast exposure No h/o excessive NSAIDs but was on voltaren gel as outpatient  Renal U.S neg for obstruction ? Over diuresis with lasix Urinalysis, UPC, upep, spep pending -careful use of  iv lasix; prevent Hypotension - can  consider changing to oral in 1-2 days  2. LE edema Fluid 3rd spacing vs lymphedema Undergoing careful diuresis Iv albumin supplementation  3. Elevated PSA - followed by oncologist at [place] - biopsy was recommended in March  4. Diabetes type 2 with CKD - insulin dependent - goal A1c 6.5-8% Lab Results  Component Value Date   HGBA1C 8.1 (H) 10/15/2019        LOS: 2 [name] 12/28/202010:01 AM  [place], [place]  Note: This note was prepared with Dragon dictation. Any transcription errors are unintentional

## 2019-10-17 NOTE — Consult Note (Signed)
[facility] Consultation Note  Patient ID: [name], MRN: [id number], DOB/AGE: 12/03/1931 85 y.o. Admit date: 10/14/2019   Date of Consult: 10/17/2019 Primary Physician: [name], PA-C Primary Cardiologist: None   Chief Complaint:  Chief Complaint  Patient presents with  . Weakness   Reason for Consult: Heart failure  HPI: 85 y.o. male with known diabetes hypertension hyperlipidemia chronic kidney disease stage III deep venous thrombosis with chronic anticoagulation and acute on chronic diastolic dysfunction congestive heart failure.  The patient has had significant progression of weight gain and significant lower extremity edema and abdominal edema PND orthopnea and shortness of breath.  With this he was unable to stay at home and therefore was continuing to have some difficulty.  When seen in the emergency room he had an EKG showing normal sinus rhythm and no evidence of myocardial infarction.  He did receive intravenous Lasix which did help with some of the symptoms and the patient has had some improvements.  The patient still has severe lower extremity edema dominating minute shortness of breath but slightly better.  He is not requiring oxygenation.  Echocardiogram has shown normal LV systolic function ejection fraction is 65% with some pulmonary hypertension and mild valvular heart disease but no evidence of myocardial infarction or any other significant new changes.  The patient is hemodynamically stable  Past Medical History:  Diagnosis Date  . Anemia, normocytic normochromic   . BPH (benign prostatic hyperplasia)   . CAD (coronary artery disease)   . Cataracts, bilateral   . Chronic renal insufficiency   . Colon polyp   . COPD (chronic obstructive pulmonary disease) ([place])   . Diabetes mellitus without complication ([place])   . DVT (deep venous thrombosis) ([place])   . Edema of both legs   . Glaucoma   . Gout   . Heart murmur   . Hyperlipidemia   .  Hypertension   . Moderate persistent asthma without complication   . Myocardial infarction ([place])   . Neuromuscular disorder ([place])   . Neuropathy   . Peripheral vascular disease ([place])   . Premature ejaculation   . Prostate cancer ([place])   . RLS (restless legs syndrome)   . Sleep apnea   . Stented coronary artery 11/13/2015  . Uncontrolled type 2 diabetes mellitus with stage 3 chronic kidney disease, with long-term current use of insulin [facility])       Surgical History:  Past Surgical History:  Procedure Laterality Date  . APPENDECTOMY    . Reading  2009  . COLONOSCOPY  2012  . EYE SURGERY    . PROSTATE SURGERY    . TONSILLECTOMY       Home Meds: Prior to Admission medications   Medication Sig Start Date End Date Taking? Authorizing Provider  amLODipine (NORVASC) 10 MG tablet Take 10 mg by mouth daily. 12/03/16  Yes [provider]  aspirin 81 MG chewable tablet Chew by mouth. 07/15/19  Yes [provider]  atorvastatin (LIPITOR) 80 MG tablet Take 80 mg by mouth daily. 11/15/15  Yes [provider]  carvedilol (COREG) 25 MG tablet Take 25 mg by mouth 2 (two) times daily with a meal.   Yes [provider]  Cholecalciferol (VITAMIN D) 50 MCG (2000 UT) tablet Take 2,000 Units by mouth daily.    Yes [provider]  clopidogrel (PLAVIX) 75 MG tablet Take 75 mg by mouth daily. 11/12/16  Yes [provider]  diclofenac Sodium (VOLTAREN) 1 %  GEL Apply 2 g topically 4 (four) times daily.  04/18/19 04/17/20 Yes [provider]  ELIQUIS 5 MG TABS tablet Take 5 mg by mouth every 12 (twelve) hours.  10/03/19  Yes [provider]  famotidine (PEPCID) 40 MG tablet Take 20 mg by mouth at bedtime as needed for heartburn. 08/26/19 08/25/20 Yes [provider]  finasteride (PROSCAR) 5 MG tablet Take 5 mg by mouth daily. 05/22/14  Yes [provider]  furosemide (LASIX) 40 MG tablet Take 40 mg by mouth daily.     Yes [provider]  gabapentin (NEURONTIN) 300 MG capsule Take 300 mg by mouth 3 (three) times daily. Five times daily 10/18/18  Yes [provider]  insulin aspart (NOVOLOG) 100 UNIT/ML FlexPen Inject 12 Units into the skin 3 (three) times daily with meals.    Yes [provider]  Insulin Detemir (LEVEMIR) 100 UNIT/ML Pen Inject 35 Units into the skin 2 (two) times daily.    Yes [provider]  Multiple Vitamin (MULTI-VITAMIN) tablet Take 1 tablet by mouth daily.   Yes [provider]  nitroGLYCERIN (NITROSTAT) 0.4 MG SL tablet Place 0.4 mg under the tongue as needed.   Yes [provider]  DULoxetine (CYMBALTA) 60 MG capsule Take 60 mg by mouth daily.    [provider]    Inpatient Medications:  . apixaban  5 mg Oral BID  . aspirin EC  81 mg Oral Daily  . atorvastatin  80 mg Oral q1800  . carvedilol  25 mg Oral BID WC  . clopidogrel  75 mg Oral Daily  . famotidine  20 mg Oral Daily  . finasteride  5 mg Oral Daily  . furosemide  40 mg Intravenous BID  . furosemide  40 mg Intravenous Once  . insulin aspart  0-15 Units Subcutaneous TID WC  . insulin detemir  30 Units Subcutaneous Daily  . sodium chloride flush  3 mL Intravenous Q12H   . sodium chloride    . albumin human 25 g (10/17/19 3419)    Allergies:  Allergies  Allergen Reactions  . Metformin And Related Diarrhea    Intolerance due to naturally loose bowels    Social History   Socioeconomic History  . Marital status: Widowed    Spouse name: Not on file  . Number of children: Not on file  . Years of education: Not on file  . Highest education level: Not on file  Occupational History  . Not on file  Tobacco Use  . Smoking status: Former Research scientist (life sciences)  . Smokeless tobacco: Never Used  Substance and Sexual Activity  . Alcohol use: Not on file  . Drug use: Not on file  . Sexual activity: Not on file  Other Topics Concern  . Not on file  Social History  Narrative  . Not on file   Social Determinants of Health   Financial Resource Strain:   . Difficulty of Paying Living Expenses: Not on file  Food Insecurity:   . Worried About Charity fundraiser in the Last Year: Not on file  . Ran Out of Food in the Last Year: Not on file  Transportation Needs:   . Lack of Transportation (Medical): Not on file  . Lack of Transportation (Non-Medical): Not on file  Physical Activity:   . Days of Exercise per Week: Not on file  . Minutes of Exercise per Session: Not on file  Stress:   . Feeling of Stress : Not  on file  Social Connections:   . Frequency of Communication with Friends and Family: Not on file  . Frequency of Social Gatherings with Friends and Family: Not on file  . Attends Religious Services: Not on file  . Active Member of Clubs or Organizations: Not on file  . Attends Archivist Meetings: Not on file  . Marital Status: Not on file  Intimate Partner Violence:   . Fear of Current or Ex-Partner: Not on file  . Emotionally Abused: Not on file  . Physically Abused: Not on file  . Sexually Abused: Not on file     History reviewed. No pertinent family history.   Review of Systems Positive for shortness of breath PND orthopnea Negative for: General:  chills, fever, night sweats or weight changes.  Cardiovascular: Positive for PND orthopnea negative for syncope dizziness  Dermatological skin lesions rashes Respiratory: Cough congestion Urologic: Frequent urination urination at night and hematuria Abdominal: negative for nausea, vomiting, diarrhea, bright red blood per rectum, melena, or hematemesis Neurologic: negative for visual changes, and/or hearing changes  All other systems reviewed and are otherwise negative except as noted above.  Labs: No results for input(s): CKTOTAL, CKMB, TROPONINI in the last 72 hours. Lab Results  Component Value Date   WBC 12.3 (H) 10/16/2019   HGB 9.3 (L) 10/16/2019   HCT 27.8 (L)  10/16/2019   MCV 80.6 10/16/2019   PLT 307 10/16/2019    Recent Labs  Lab 10/17/19 0528  NA 132*  K 3.6  CL 92*  CO2 28  BUN 32*  CREATININE 2.26*  CALCIUM 9.0  PROT 6.9  BILITOT 1.2  ALKPHOS 146*  ALT 71*  AST 43*  GLUCOSE 120*   No results found for: CHOL, HDL, LDLCALC, TRIG No results found for: DDIMER  Radiology/Studies:  CT HEAD WO CONTRAST  Result Date: 10/15/2019 CLINICAL DATA:  Head trauma EXAM: CT HEAD WITHOUT CONTRAST TECHNIQUE: Contiguous axial images were obtained from the base of the skull through the vertex without intravenous contrast. COMPARISON:  None. FINDINGS: Brain: No evidence of acute territorial infarction, hemorrhage, hydrocephalus,extra-axial collection or mass lesion/mass effect. There is dilatation the ventricles and sulci consistent with age-related atrophy. Low-attenuation changes in the deep white matter consistent with small vessel ischemia. Vascular: No hyperdense vessel or unexpected calcification. Skull: The skull is intact. No fracture or focal lesion identified. Sinuses/Orbits: The visualized paranasal sinuses and mastoid air cells are clear. The orbits and globes intact. Other: None IMPRESSION: No acute intracranial abnormality. Findings consistent with age related atrophy and chronic small vessel ischemia Electronically Signed   By: Prudencio Pair M.D.   On: 10/15/2019 03:49   US RENAL  Result Date: 10/16/2019 CLINICAL DATA:  Acute renal failure, chronic kidney disease stage 3. EXAM: RENAL / URINARY TRACT ULTRASOUND COMPLETE COMPARISON:  None. FINDINGS: Right Kidney: Renal measurements: 10.1 x 6.3 x 6.4 cm = volume: 215 mL. Mild increased cortical echogenicity. No mass or hydronephrosis visualized. 6.3 cm cyst over the mid pole. Left Kidney: Renal measurements: 9.5 x 6.1 x 6.3 cm = volume: 193 mL. Mild increased cortical echogenicity. No mass or hydronephrosis visualized. Bladder: Appears normal for degree of bladder distention. Other: None.  IMPRESSION: 1. Normal size kidneys without hydronephrosis. Mild increased cortical echogenicity which can be seen with medical renal disease. 2.  6.3 cm right renal cyst. Electronically Signed   By: [name] M.D.   On: 10/16/2019 09:17   DG Chest Portable 1 View  Result Date: 10/15/2019  CLINICAL DATA:  Shortness of breath, wheezing, positive CHF EXAM: PORTABLE CHEST 1 VIEW COMPARISON:  Radiograph 12/18/2018 FINDINGS: Hazy interstitial opacities more pronounced towards the lung bases with some bandlike areas of opacity likely reflecting superimposed subsegmental atelectasis and/or scarring. Mild cephalization of the pulmonary vascularity with some fissural thickening. Cardiomegaly is similar to comparison accounting for differences in technique. No visible effusion or pneumothorax. No acute osseous or soft tissue abnormality. Telemetry leads overlie the chest. IMPRESSION: Features compatible with CHF including cardiomegaly and mild interstitial edema. More bandlike opacities likely reflect subsegmental atelectasis or scarring. Electronically Signed   By: Lovena Le M.D.   On: 10/15/2019 00:06   ECHOCARDIOGRAM COMPLETE  Result Date: 10/15/2019   ECHOCARDIOGRAM REPORT   Patient Name:   MONTEL VANDERHOOF Date of Exam: 10/15/2019 Medical Rec #:  [id number]       Height:       71.0 in Accession #:    [id number]      Weight:       275.0 lb Date of Birth:  [birth year]       BSA:          2.41 m Patient Age:    7 years        BP:           140/60 mmHg Patient Gender: M               HR:           67 bpm. Exam Location:  [facility] Procedure: 2D Echo, Color Doppler and Cardiac Doppler Indications:     I50.9 Congestive Heart Failure  History:         Patient has no prior history of Echocardiogram examinations.                  CAD and Previous Myocardial Infarction, PVD and COPD; Risk                  Factors:Diabetes, Hypertension and Dyslipidemia.  Sonographer:     Charmayne Sheer RDCS (AE) Referring Phys:  [id number] Athena Masse Diagnosing Phys: Ida Rogue MD  Sonographer Comments: Suboptimal parasternal window and no subcostal window. Pt moving throughout study due to SOB and Image acquisition challenging due to COPD. IMPRESSIONS  1. Left ventricular ejection fraction, by visual estimation, is 60 to 65%. The left ventricle has normal function. There is no left ventricular hypertrophy.  2. Left ventricular diastolic parameters are consistent with Grade I diastolic dysfunction (impaired relaxation).  3. The left ventricle has no regional wall motion abnormalities.  4. Global right ventricle has normal systolic function.The right ventricular size is normal. No increase in right ventricular wall thickness.  5. Left atrial size was mildly dilated.  6. Moderately elevated pulmonary artery systolic pressure.  7. The tricuspid regurgitant velocity is 3.12 m/s, and with an assumed right atrial pressure of 10 mmHg, the estimated right ventricular systolic pressure is moderately elevated at 48.9 mmHg. FINDINGS  Left Ventricle: Left ventricular ejection fraction, by visual estimation, is 60 to 65%. The left ventricle has normal function. The left ventricle has no regional wall motion abnormalities. There is no left ventricular hypertrophy. Left ventricular diastolic parameters are consistent with Grade I diastolic dysfunction (impaired relaxation). Normal left atrial pressure. Right Ventricle: The right ventricular size is normal. No increase in right ventricular wall thickness. Global RV systolic function is has normal systolic function. The tricuspid regurgitant velocity is 3.12 m/s, and with an assumed right atrial pressure  of 10 mmHg, the estimated right ventricular systolic pressure is moderately elevated at 48.9 mmHg. Left Atrium: Left atrial size was mildly dilated. Right Atrium: Right atrial size was normal in size Pericardium: There is no evidence of pericardial effusion. Mitral Valve: The mitral valve is normal in structure.  Mild mitral valve regurgitation. No evidence of mitral valve stenosis by observation. MV peak gradient, 4.6 mmHg. Tricuspid Valve: The tricuspid valve is normal in structure. Tricuspid valve regurgitation is not demonstrated. Aortic Valve: The aortic valve was not well visualized. Aortic valve regurgitation is not visualized. Mild aortic valve sclerosis is present, with no evidence of aortic valve stenosis. Aortic valve mean gradient measures 7.0 mmHg. Aortic valve peak gradient measures 11.6 mmHg. Aortic valve area, by VTI measures 3.20 cm. Pulmonic Valve: The pulmonic valve was normal in structure. Pulmonic valve regurgitation is not visualized. Pulmonic regurgitation is not visualized. Aorta: The aortic root, ascending aorta and aortic arch are all structurally normal, with no evidence of dilitation or obstruction. Venous: The inferior vena cava is normal in size with greater than 50% respiratory variability, suggesting right atrial pressure of 3 mmHg. IAS/Shunts: No atrial level shunt detected by color flow Doppler. There is no evidence of a patent foramen ovale. No ventricular septal defect is seen or detected. There is no evidence of an atrial septal defect.  LEFT VENTRICLE PLAX 2D LVIDd:         4.58 cm  Diastology LVIDs:         3.28 cm  LV e' lateral:   9.36 cm/s LV PW:         1.15 cm  LV E/e' lateral: 9.2 LV IVS:        0.82 cm  LV e' medial:    6.64 cm/s LVOT diam:     2.40 cm  LV E/e' medial:  13.0 LV SV:         53 ml LV SV Index:   20.73 LVOT Area:     4.52 cm  RIGHT VENTRICLE RV Basal diam:  3.04 cm LEFT ATRIUM             Index       RIGHT ATRIUM           Index LA diam:        4.40 cm 1.82 cm/m  RA Area:     12.60 cm LA Vol (A2C):   62.9 ml 26.05 ml/m RA Volume:   27.60 ml  11.43 ml/m LA Vol (A4C):   52.2 ml 21.62 ml/m LA Biplane Vol: 57.9 ml 23.98 ml/m  AORTIC VALVE                    PULMONIC VALVE AV Area (Vmax):    3.41 cm     PV Vmax:       1.04 m/s AV Area (Vmean):   3.30 cm     PV  Vmean:      71.000 cm/s AV Area (VTI):     3.20 cm     PV VTI:        0.264 m AV Vmax:           170.00 cm/s  PV Peak grad:  4.3 mmHg AV Vmean:          125.000 cm/s PV Mean grad:  2.0 mmHg AV VTI:            0.401 m AV Peak Grad:      11.6 mmHg AV  Mean Grad:      7.0 mmHg LVOT Vmax:         128.00 cm/s LVOT Vmean:        91.100 cm/s LVOT VTI:          0.284 m LVOT/AV VTI ratio: 0.71  AORTA Ao Root diam: 3.30 cm MITRAL VALVE                        TRICUSPID VALVE MV Area (PHT): 3.53 cm             TR Peak grad:   38.9 mmHg MV Peak grad:  4.6 mmHg             TR Vmax:        312.00 cm/s MV Mean grad:  2.0 mmHg MV Vmax:       1.07 m/s             SHUNTS MV Vmean:      68.9 cm/s            Systemic VTI:  0.28 m MV VTI:        0.38 m               Systemic Diam: 2.40 cm MV PHT:        62.35 msec MV Decel Time: 215 msec MV E velocity: 86.30 cm/s 103 cm/s MV A velocity: 94.10 cm/s 70.3 cm/s MV E/A ratio:  0.92       1.5  Ida Rogue MD Electronically signed by Ida Rogue MD Signature Date/Time: 10/15/2019/3:57:33 PM    Final     EKG: Normal sinus rhythm  Weights: Filed Weights   10/15/19 1415 10/16/19 0410 10/17/19 0453  Weight: 129.7 kg 129.5 kg 126.7 kg     Physical Exam: Blood pressure (!) 142/57, pulse 68, temperature (!) 97.5 F (36.4 C), temperature source Oral, resp. rate 18, height 5\' 11"  (1.803 m), weight 126.7 kg, SpO2 98 %. Body mass index is 38.97 kg/m. General: Well developed, well nourished, in no acute distress. Head eyes ears nose throat: Normocephalic, atraumatic, sclera non-icteric, no xanthomas, nares are without discharge. No apparent thyromegaly and/or mass  Lungs: Normal respiratory effort.  Few wheezes, basilar rales, no rhonchi.  Heart: RRR with normal S1 S2. no murmur gallop, no rub, PMI is normal size and placement, carotid upstroke normal without bruit, jugular venous pressure is normal Abdomen: Soft, non-tender, distended with normoactive bowel sounds. No  hepatomegaly. No rebound/guarding. No obvious abdominal masses. Abdominal aorta is normal size without bruit Extremities: 2+ edema. no cyanosis, no clubbing, no ulcers  Peripheral : 2+ bilateral upper extremity pulses, 2+ bilateral femoral pulses, 2+ bilateral dorsal pedal pulse Neuro: Alert and oriented. No facial asymmetry. No focal deficit. Moves all extremities spontaneously. Musculoskeletal: Normal muscle tone without kyphosis Psych:  Responds to questions appropriately with a normal affect.    Assessment: 85 year old male with chronic kidney disease diabetes hypertension hyperlipidemia coronary artery disease deep venous thrombosis with acute on chronic diastolic dysfunction congestive heart failure without evidence of myocardial infarction  Plan: 1.  Continue intravenous Lasix for abdominal lower extremity edema and pulmonary edema from acute on chronic diastolic dysfunction heart failure 2.  No further cardiac diagnostics necessary at this time 3.  Continue antihypertensive medication management for further risk reduction in worsening diastolic dysfunction heart failure and shortness of breath 4.  Continue anticoagulation for further risk reduction and lower extremity deep venous thrombosis and pulmonary embolism  although would discontinue other antiplatelet for concerns for bleeding complications and triple therapy 5.  Begin ambulation and follow for improvements of symptoms and need for adjustments of medication management  Signed, Corey Skains M.D. [facility] 10/17/2019, 9:37 AM

## 2019-10-17 NOTE — Progress Notes (Signed)
Patient ID: [name], male   DOB: 1932-05-10, 85 y.o.   MRN: [id number] PROGRESS NOTE    [name]  [id number]:[id number] DOB: Jun 23, 1932, 85 y.o. DOA: 10/14/2019 PCP: [name], PA-C    Brief Narrative:  [name] is a 85 y.o. obese male lives at home with his daughter ambulate with a cane, with medical history significant for congestive heart failure, CKD 3, insulin-dependent type 2 diabetes, coronary artery disease, chronic anticoagulation secondary to history of DVT and history of frequent falls was brought to the emergency room because of generalized weakness,    Consultants:   None  Procedures: Echo  Antimicrobials:   None   Subjective: Patient reports feeling weak.  Unable to tell me if he is still short of breath.  No other new issues   Objective: Vitals:   10/16/19 1647 10/16/19 1914 10/17/19 0453 10/17/19 0912  BP: (!) 130/47 (!) 132/54 (!) 134/50 (!) 142/57  Pulse: 72 74 67 68  Resp: 18  18   Temp: 97.6 F (36.4 C) 98.1 F (36.7 C) 98.3 F (36.8 C) (!) 97.5 F (36.4 C)  TempSrc: Oral Oral Oral Oral  SpO2: 100% 99% 98% 98%  Weight:   126.7 kg   Height:        Intake/Output Summary (Last 24 hours) at 10/17/2019 1335 Last data filed at 10/17/2019 1300 Gross per 24 hour  Intake 660.85 ml  Output 0 ml  Net 660.85 ml   Filed Weights   10/15/19 1415 10/16/19 0410 10/17/19 0453  Weight: 129.7 kg 129.5 kg 126.7 kg    Examination:  General exam: Appears calm and comfortable , NAD, obese, appears tired Respiratory system: mild bilateral crackles scattered, no wheezing or rhonchi Cardiovascular system: S1 & S2 heard, RRR.  No murmurs, rubs, gallops or clicks.  Gastrointestinal system: Abdomen less distended, less bleeding through abdomen obese ,less fluid., NT, +bs Central nervous system: Alert and oriented x3.  Grossly intact Extremities 2+ edema bilaterally/lymphedema, improved some with chronic skin changes. Skin: Warm dry Psychiatry:  Judgement and insight appear normal. Mood & affect appropriate.     Data Reviewed: I have personally reviewed following labs and imaging studies  CBC: Recent Labs  Lab 10/14/19 2338 10/15/19 1336 10/16/19 0629  WBC 10.9* 12.6* 12.3*  HGB 10.2* 10.0* 9.3*  HCT 30.6* 30.9* 27.8*  MCV 81.0 84.2 80.6  PLT 356 299 270   Basic Metabolic Panel: Recent Labs  Lab 10/14/19 2338 10/15/19 1336 10/16/19 0629 10/17/19 0528  NA 133* 130* 130* 132*  K 4.4 4.5 4.0 3.6  CL 94* 91* 91* 92*  CO2 27 26 26 28   GLUCOSE 79 223* 224* 120*  BUN 23 27* 31* 32*  CREATININE 2.04* 2.12* 2.02* 2.26*  CALCIUM 9.4 8.8* 8.5* 9.0  PHOS  --   --  4.3  --    GFR: Estimated Creatinine Clearance: 32.4 mL/min (A) (by C-G formula based on SCr of 2.26 mg/dL (H)). Liver Function Tests: Recent Labs  Lab 10/14/19 2338 10/16/19 0629 10/17/19 0528  AST 93*  --  43*  ALT 86*  --  71*  ALKPHOS 196*  --  146*  BILITOT 0.7  --  1.2  PROT 7.5  --  6.9  ALBUMIN 3.2* 2.7* 3.2*   No results for input(s): LIPASE, AMYLASE in the last 168 hours. No results for input(s): AMMONIA in the last 168 hours. Coagulation Profile: No results for input(s): INR, PROTIME in the last 168 hours. Cardiac Enzymes: No results for input(s):  CKTOTAL, CKMB, CKMBINDEX, TROPONINI in the last 168 hours. BNP (last 3 results) No results for input(s): PROBNP in the last 8760 hours. HbA1C: Recent Labs    10/15/19 1336  HGBA1C 8.1*   CBG: Recent Labs  Lab 10/16/19 1110 10/16/19 1648 10/16/19 2033 10/17/19 0911 10/17/19 1147  GLUCAP 276* 241* 152* 130* 201*   Lipid Profile: No results for input(s): CHOL, HDL, LDLCALC, TRIG, CHOLHDL, LDLDIRECT in the last 72 hours. Thyroid Function Tests: No results for input(s): TSH, T4TOTAL, FREET4, T3FREE, THYROIDAB in the last 72 hours. Anemia Panel: No results for input(s): VITAMINB12, FOLATE, FERRITIN, TIBC, IRON, RETICCTPCT in the last 72 hours. Sepsis Labs: No results for input(s):  PROCALCITON, LATICACIDVEN in the last 168 hours.  Recent Results (from the past 240 hour(s))  SARS CORONAVIRUS 2 (TAT 6-24 HRS) Nasopharyngeal Nasopharyngeal Swab     Status: None   Collection Time: 10/15/19  1:35 AM   Specimen: Nasopharyngeal Swab  Result Value Ref Range Status   SARS Coronavirus 2 NEGATIVE NEGATIVE Final    Comment: (NOTE) SARS-CoV-2 target nucleic acids are NOT DETECTED. The SARS-CoV-2 RNA is generally detectable in upper and lower respiratory specimens during the acute phase of infection. Negative results do not preclude SARS-CoV-2 infection, do not rule out co-infections with other pathogens, and should not be used as the sole basis for treatment or other patient management decisions. Negative results must be combined with clinical observations, patient history, and epidemiological information. The expected result is Negative. Fact Sheet for Patients: SugarRoll.be Fact Sheet for Healthcare Providers: https://www.woods-mathews.com/ This test is not yet approved or cleared by the [place] FDA and  has been authorized for detection and/or diagnosis of SARS-CoV-2 by FDA under an Emergency Use Authorization (EUA). This EUA will remain  in effect (meaning this test can be used) for the duration of the COVID-19 declaration under Section 56 4(b)(1) of the Act, 21 U.S.C. section 360bbb-3(b)(1), unless the authorization is terminated or revoked sooner. Performed at [facility], [place] [address], [place], [place] [address]          Radiology Studies: US RENAL  Result Date: 10/16/2019 CLINICAL DATA:  Acute renal failure, chronic kidney disease stage 3. EXAM: RENAL / URINARY TRACT ULTRASOUND COMPLETE COMPARISON:  None. FINDINGS: Right Kidney: Renal measurements: 10.1 x 6.3 x 6.4 cm = volume: 215 mL. Mild increased cortical echogenicity. No mass or hydronephrosis visualized. 6.3 cm cyst over the mid pole. Left Kidney:  Renal measurements: 9.5 x 6.1 x 6.3 cm = volume: 193 mL. Mild increased cortical echogenicity. No mass or hydronephrosis visualized. Bladder: Appears normal for degree of bladder distention. Other: None. IMPRESSION: 1. Normal size kidneys without hydronephrosis. Mild increased cortical echogenicity which can be seen with medical renal disease. 2.  6.3 cm right renal cyst. Electronically Signed   By: [name] M.D.   On: 10/16/2019 09:17        Scheduled Meds: . apixaban  5 mg Oral BID  . aspirin EC  81 mg Oral Daily  . atorvastatin  80 mg Oral q1800  . carvedilol  25 mg Oral BID WC  . famotidine  20 mg Oral Daily  . finasteride  5 mg Oral Daily  . furosemide  40 mg Intravenous BID  . furosemide  40 mg Intravenous Once  . insulin aspart  0-15 Units Subcutaneous TID WC  . insulin detemir  30 Units Subcutaneous Daily  . sodium chloride flush  3 mL Intravenous Q12H   Continuous Infusions: .  sodium chloride    . albumin human 25 g (10/17/19 4315)    Assessment & Plan:   Principal Problem:   Acute exacerbation of CHF (congestive heart failure) (HCC) Active Problems:   Anasarca   CAD (coronary artery disease)   Insulin dependent type 2 diabetes mellitus (HCC)   Essential hypertension   Chronic anticoagulation   Acute kidney injury superimposed on CKD (HCC)   CKD (chronic kidney disease) stage 3, GFR 30-59 ml/min   Generalized weakness   Frequent falls   Elevated liver enzymes   Acute on chronic diastolic CHF (congestive heart failure) (HCC)   Elevated LFTs  Acute exacerbation of CHF, suspect diastolic (congestive heart failure) ([place]) with  anasarca Improving slowly based on exam --BNP mildly elevated however patient is obese and this may be unreliable result  troponin negative x1 echo pending --Continue IV Lasix   Holding ACE inhibitors because of renal function --Daily weights salt restriction --Monitor input and output Continue Coreg covid negative. Cardiology  following , input appreciated    Accidental fall with history of frequent falls --patient reports slipping and falling in the bathroom but has been falling over the past few weeks --CT head negative. --Fall precautions -PT/OT consult    Elevated liver enzymes --Secondary  to hepatic congestion from heart failure exacerbation --Continue to monitor.   Expect improvement as acute heart failure improves, if not will ck ruq [place]    CAD (coronary artery disease) --Denies complaints of chest pain --Continue home aspirin, Plavix Continue beta blk    Insulin dependent type 2 diabetes mellitus ([place]) --Mildly uncontrolled  -continue basal insulin but at reduced dose and with supplemental insulin coverage --Hemoglobin A1c is 8.1    Essential hypertension --Blood pressure controlled.  Continue amlodipine and beta blk   chronic anticoagulation secondary to DVT --New Eliquis    Acute kidney injury superimposed on CKD 3(HCC) --Creatinine elevated at 2 above baseline of 1.4 Nephrology following F/u renal US and urine studies pending. No iv contrast IV albumin per nephrology for hypoalbuminemia/anasarca     DVT prophylaxis: Eliquis Code Status: full Family Communication: none at bedside Disposition Plan: likely be here 2 MN stays as he is volume overloaded and needs to be more medically stable.       LOS: 2 days   Time spent: 45 minutes with more than 50% COC    Brandon Hanlon, MD [facility] Pager [phone]  If 7PM-7AM, please contact night-coverage [email or web address] Password [email or web address] 10/17/2019, 1:35 PM Patient ID: [name], male   DOB: 1932-01-09, 85 y.o.   MRN: [id number]

## 2019-10-17 NOTE — Plan of Care (Signed)
  Problem: Clinical Measurements: Goal: Respiratory complications will improve Outcome: Progressing   Problem: Activity: Goal: Risk for activity intolerance will decrease Outcome: Progressing   Problem: Education: Goal: Knowledge of General Education information will improve Description: Including pain rating scale, medication(s)/side effects and non-pharmacologic comfort measures Outcome: Progressing   Problem: Pain Managment: Goal: General experience of comfort will improve Outcome: Progressing   Problem: Safety: Goal: Ability to remain free from injury will improve Outcome: Progressing

## 2019-10-17 NOTE — Evaluation (Signed)
Physical Therapy Evaluation Patient Details Name: [name] MRN: [id number] DOB: 01-May-1932 Today's Date: 10/17/2019   History of Present Illness  [name] is a 85 y.o. obese male lives at home with his daughter ambulate with a cane at baseline, with medical history significant for congestive heart failure, CKD 3, insulin-dependent type 2 diabetes, coronary artery disease, chronic anticoagulation secondary to history of DVT and history of frequent falls was brought to the emergency room thorugh EMS on 10/14/2019 because of generalized weakness, and multiple falls for the past few weeks. He was admitted to the hospital with diagnosis of acute on chronic diastolic dysfunction congestive heart failure, accidental fall with history of frequent falls, and elevated liver enzymes. Begin ambulation per cardiology note 10/17/2019.    Clinical Impression  Patient oriented to self only and may not be reliable source of information. History and PLOF supplemented by prior documentation. Patient reports prior to hospitalization he ambulated mod I with SPC household and community distances. He lives with his daughter in a single story home with 4 steps to enter. A caregiver comes to the home 3x a week to help him with activities such as bathing in the walk in shower with a chair. His daughter assists with IADLs. Upon evaluation, patient was limited by lethargy but was highly motivated to get to the chair and sit up for a while. He required mod A for bed mobility and mod A to max A for sit <> stand transfers using RW for support. Required assistance for trunk control to prevent falling to the R in seated position. Patient appears to have experienced a significant decline in functional independence/mobility and would benefit from short term rehab prior to returning home. Patient would benefit from physical therapy to address impairments and functional limitations (see PT Problem List below) to work towards  stated goals and return to PLOF or maximal functional independence.      Follow Up Recommendations SNF    Equipment Recommendations  Rolling walker with 5" wheels;3in1 (PT);Hospital bed    Recommendations for Other Services OT consult     Precautions / Restrictions Precautions Precautions: Fall Restrictions Weight Bearing Restrictions: No      Mobility  Bed Mobility Overal bed mobility: Needs Assistance Bed Mobility: Supine to Sit     Supine to sit: Mod assist;HOB elevated     General bed mobility comments: required assistance with BLE and trunk. Requires support at first to prevent falling back to right side.  Transfers Overall transfer level: Needs assistance Equipment used: Rolling walker (2 wheeled) Transfers: Sit to/from Stand Sit to Stand: Max assist;Mod assist         General transfer comment: Patient transfered from elevated bed sit <> stand with mod A to acheive weight shift forwards and come to stand. He required max A to stand from/to chair (lower surface) in order to shift forward and lift buttocks. Required strong and constant cuing for hand placment and to extend hips.  Ambulation/Gait   Gait Distance (Feet): (not appropriate at this time second to cognition and poor trunk control)            Stairs            Wheelchair Mobility    Modified Rankin (Stroke Patients Only)       Balance Overall balance assessment: Needs assistance Sitting-balance support: Bilateral upper extremity supported Sitting balance-Leahy Scale: Poor Sitting balance - Comments: initially unable to maintain seated balance more than a few seconds prior to trunk  leaning to R Postural control: Right lateral lean Standing balance support: Bilateral upper extremity supported;During functional activity Standing balance-Leahy Scale: Poor Standing balance comment: heavily dependent upon RW for support during standing activities.                              Pertinent Vitals/Pain Pain Assessment: No/denies pain    Home Living Family/patient expects to be discharged to:: Private residence Living Arrangements: Children Available Help at Discharge: Family;Personal care attendant Type of Home: House Home Access: Stairs to enter Entrance Stairs-Rails: Chemical engineer of Steps: 3 Home Layout: One level Home Equipment: Shower seat;Cane - single point Additional Comments: Pt states he has a shower chair and three canes. One cane is in the room is SPC, cannot describe the other two. Denies having walker, BSC, w/c, or hospital bed.    Prior Function Level of Independence: Independent with assistive device(s)         Comments: Pt reports he ambulated in the home and the community with [place]. A caregiver comes to the home 3x a week to help him with bathing and other personal care and daughter assists with housework and errands.     Hand Dominance        Extremity/Trunk Assessment   Upper Extremity Assessment Upper Extremity Assessment: Generalized weakness(Weakness in bilateral arms. Did not attempt to raise overhead)    Lower Extremity Assessment Lower Extremity Assessment: Generalized weakness    Cervical / Trunk Assessment Cervical / Trunk Assessment: Kyphotic(difficulty maintaining trunk stability in sitting position. Very slumped.)  Communication   Communication: No difficulties(sleepy)  Cognition Arousal/Alertness: Lethargic Behavior During Therapy: Flat affect Overall Cognitive Status: Impaired/Different from baseline Area of Impairment: Memory;Awareness                               General Comments: Patient oriented only to self, after prolonged time for thought states he does not know the year or where he is. When informed he is in the hosptal and asked if he knows why, he states he was not feeling good. Remembers he came in on Christmas with some prompting. Keeps referring to door in the  room that is not there and asked if "Ladona Mow" the actor was there.      General Comments      Exercises Other Exercises Other Exercises: time spent arranging room in preparation for mobility and setting up chair alarm, moving needs within reach, applying blanket at pt's request.   Assessment/Plan    PT Assessment Patient needs continued PT services  PT Problem List Decreased strength;Decreased mobility;Decreased safety awareness;Obesity;Decreased knowledge of precautions;Decreased activity tolerance;Decreased cognition;Cardiopulmonary status limiting activity;Decreased balance;Decreased knowledge of use of DME;Decreased skin integrity       PT Treatment Interventions DME instruction;Therapeutic activities;Cognitive remediation;Gait training;Therapeutic exercise;Patient/family education;Stair training;Balance training;Functional mobility training;Neuromuscular re-education    PT Goals (Current goals can be found in the Care Plan section)  Acute Rehab PT Goals Patient Stated Goal: return home PT Goal Formulation: With patient Time For Goal Achievement: 10/31/19 Potential to Achieve Goals: Good    Frequency Min 2X/week   Barriers to discharge Inaccessible home environment;Decreased caregiver support Patient currently needs constant supervision and is dependent for care    Co-evaluation               AM-PAC PT "6 Clicks" Mobility  Outcome Measure Help needed turning from your  back to your side while in a flat bed without using bedrails?: A Lot Help needed moving from lying on your back to sitting on the side of a flat bed without using bedrails?: A Lot Help needed moving to and from a bed to a chair (including a wheelchair)?: A Lot Help needed standing up from a chair using your arms (e.g., wheelchair or bedside chair)?: A Lot Help needed to walk in hospital room?: Total Help needed climbing 3-5 steps with a railing? : Total 6 Click Score: 10    End of Session  Equipment Utilized During Treatment: Gait belt Activity Tolerance: Patient tolerated treatment well;Patient limited by lethargy;Patient limited by fatigue Patient left: in chair;with call bell/phone within reach;with chair alarm set Nurse Communication: Mobility status;Need for lift equipment;Precautions(possible need for lift equipment) PT Visit Diagnosis: Unsteadiness on feet (R26.81);History of falling (Z91.81);Difficulty in walking, not elsewhere classified (R26.2);Muscle weakness (generalized) (M62.81)    Time: 1810-1850 PT Time Calculation (min) (ACUTE ONLY): 40 min   Charges:   PT Evaluation $PT Eval Moderate Complexity: 1 Mod PT Treatments $Therapeutic Activity: 8-22 mins       Everlean Alstrom. Graylon Good, PT, DPT 10/17/19, 8:42 PM

## 2019-10-18 LAB — BASIC METABOLIC PANEL
Anion gap: 13 (ref 5–15)
BUN: 35 mg/dL — ABNORMAL HIGH (ref 8–23)
CO2: 26 mmol/L (ref 22–32)
Calcium: 9.1 mg/dL (ref 8.9–10.3)
Chloride: 91 mmol/L — ABNORMAL LOW (ref 98–111)
Creatinine, Ser: 2.21 mg/dL — ABNORMAL HIGH (ref 0.61–1.24)
GFR calc Af Amer: 30 mL/min — ABNORMAL LOW (ref >60)
GFR calc non Af Amer: 26 mL/min — ABNORMAL LOW (ref >60)
Glucose, Bld: 136 mg/dL — ABNORMAL HIGH (ref 70–99)
Potassium: 3.4 mmol/L — ABNORMAL LOW (ref 3.5–5.1)
Sodium: 130 mmol/L — ABNORMAL LOW (ref 135–145)

## 2019-10-18 LAB — GLUCOSE, CAPILLARY
Glucose-Capillary: 97 mg/dL (ref 70–99)
Glucose-Capillary: 236 mg/dL — ABNORMAL HIGH (ref 70–99)
Glucose-Capillary: 170 mg/dL — ABNORMAL HIGH (ref 70–99)
Glucose-Capillary: 169 mg/dL — ABNORMAL HIGH (ref 70–99)

## 2019-10-18 LAB — PROTEIN ELECTROPHORESIS, SERUM
A/G Ratio: 0.9 (ref 0.7–1.7)
Albumin ELP: 2.6 g/dL — ABNORMAL LOW (ref 2.9–4.4)
Alpha-1-Globulin: 0.4 g/dL (ref 0.0–0.4)
Alpha-2-Globulin: 1 g/dL (ref 0.4–1.0)
Beta Globulin: 0.8 g/dL (ref 0.7–1.3)
Gamma Globulin: 0.8 g/dL (ref 0.4–1.8)
Globulin, Total: 3 g/dL (ref 2.2–3.9)
M-Spike, %: 0.3 g/dL — ABNORMAL HIGH
Total Protein ELP: 5.6 g/dL — ABNORMAL LOW (ref 6.0–8.5)

## 2019-10-18 LAB — CBC
HCT: 27.3 % — ABNORMAL LOW (ref 39.0–52.0)
Hemoglobin: 8.7 g/dL — ABNORMAL LOW (ref 13.0–17.0)
MCH: 27.2 pg (ref 26.0–34.0)
MCHC: 31.9 g/dL (ref 30.0–36.0)
MCV: 85.3 fL (ref 80.0–100.0)
Platelets: 317 10*3/uL (ref 150–400)
RBC: 3.2 MIL/uL — ABNORMAL LOW (ref 4.22–5.81)
RDW: 13.7 % (ref 11.5–15.5)
WBC: 11.7 10*3/uL — ABNORMAL HIGH (ref 4.0–10.5)
nRBC: 0 % (ref 0.0–0.2)

## 2019-10-18 LAB — KAPPA/LAMBDA LIGHT CHAINS
Kappa free light chain: 45.6 mg/L — ABNORMAL HIGH (ref 3.3–19.4)
Kappa, lambda light chain ratio: 1.85 — ABNORMAL HIGH (ref 0.26–1.65)
Lambda free light chains: 24.6 mg/L (ref 5.7–26.3)

## 2019-10-18 LAB — PARATHYROID HORMONE, INTACT (NO CA): PTH: 70 pg/mL — ABNORMAL HIGH (ref 15–65)

## 2019-10-18 MED ORDER — TORSEMIDE 20 MG PO TABS
40.0000 mg | ORAL_TABLET | Freq: Every day | ORAL | Status: DC
  Administered 2019-10-18 – 2019-10-20 (×3): 40 mg via ORAL
  Filled 2019-10-18 (×3): qty 2

## 2019-10-18 MED ORDER — POTASSIUM CHLORIDE CRYS ER 20 MEQ PO TBCR
20.0000 meq | EXTENDED_RELEASE_TABLET | Freq: Every day | ORAL | Status: AC
  Administered 2019-10-18 – 2019-10-20 (×3): 20 meq via ORAL
  Filled 2019-10-18 (×2): qty 1
  Filled 2019-10-18: qty 2

## 2019-10-18 NOTE — Progress Notes (Signed)
Patient ID: [name], male   DOB: [birth year], 85 y.o.   MRN: [id number] PROGRESS NOTE    [name]  BJS:[id number] DOB: May 06, 1932 DOA: 10/14/2019 PCP: [name], PA-C    Brief Narrative:  [name] is a 85 y.o. obese male lives at home with his daughter ambulate with a cane, with medical history significant for congestive heart failure, CKD 3, insulin-dependent type 2 diabetes, coronary artery disease, chronic anticoagulation secondary to history of DVT and history of frequent falls was brought to the emergency room because of generalized weakness,    Consultants:   Cardiology  nephrology  Procedures: Echo  Antimicrobials:   None   Subjective: Pt states feels little better today. Denies sob or cp. No new issues.  Objective: Vitals:   10/17/19 1611 10/17/19 2007 10/18/19 0554 10/18/19 0825  BP: (!) 125/56 (!) 133/56 (!) 128/54 (!) 162/56  Pulse: 67 67 61 60  Resp: 20 20 20 19   Temp: 98.3 F (36.8 C) 98 F (36.7 C) 98 F (36.7 C) 98.5 F (36.9 C)  TempSrc: Oral Oral Oral   SpO2: 96% 98% 99% 99%  Weight:      Height:        Intake/Output Summary (Last 24 hours) at 10/18/2019 1214 Last data filed at 10/18/2019 0900 Gross per 24 hour  Intake 299.71 ml  Output 700 ml  Net -400.29 ml   Filed Weights   10/15/19 1415 10/16/19 0410 10/17/19 0453  Weight: 129.7 kg 129.5 kg 126.7 kg    Examination:  General exam: Appears calm and comfortable , NAD, obese, sitting in chair Respiratory system: Overall more clear to auscultation with decreased breath sounds at bases no wheezing or crackles Cardiovascular system: S1 & S2 heard, RRR.  No murmurs, rubs, gallops or clicks.  Gastrointestinal system: Abdomen less distended,, soft, NT, +bs Central nervous system: Alert and oriented x3.  Grossly intact Extremities Decrease bilaterallyedema/lymphedema, chronic skin changes. Skin: Warm dry Psychiatry: Judgement and insight appear normal. Mood & affect  appropriate.     Data Reviewed: I have personally reviewed following labs and imaging studies  CBC: Recent Labs  Lab 10/14/19 2338 10/15/19 1336 10/16/19 0629 10/18/19 0448  WBC 10.9* 12.6* 12.3* 11.7*  HGB 10.2* 10.0* 9.3* 8.7*  HCT 30.6* 30.9* 27.8* 27.3*  MCV 81.0 84.2 80.6 85.3  PLT 356 299 307 607   Basic Metabolic Panel: Recent Labs  Lab 10/14/19 2338 10/15/19 1336 10/16/19 0629 10/17/19 0528 10/18/19 0919  NA 133* 130* 130* 132* 130*  K 4.4 4.5 4.0 3.6 3.4*  CL 94* 91* 91* 92* 91*  CO2 27 26 26 28 26   GLUCOSE 79 223* 224* 120* 136*  BUN 23 27* 31* 32* 35*  CREATININE 2.04* 2.12* 2.02* 2.26* 2.21*  CALCIUM 9.4 8.8* 8.5* 9.0 9.1  PHOS  --   --  4.3  --   --    GFR: Estimated Creatinine Clearance: 33.1 mL/min (A) (by C-G formula based on SCr of 2.21 mg/dL (H)). Liver Function Tests: Recent Labs  Lab 10/14/19 2338 10/16/19 0629 10/17/19 0528  AST 93*  --  43*  ALT 86*  --  71*  ALKPHOS 196*  --  146*  BILITOT 0.7  --  1.2  PROT 7.5  --  6.9  ALBUMIN 3.2* 2.7* 3.2*   No results for input(s): LIPASE, AMYLASE in the last 168 hours. No results for input(s): AMMONIA in the last 168 hours. Coagulation Profile: No results for input(s): INR, PROTIME in the  last 168 hours. Cardiac Enzymes: No results for input(s): CKTOTAL, CKMB, CKMBINDEX, TROPONINI in the last 168 hours. BNP (last 3 results) No results for input(s): PROBNP in the last 8760 hours. HbA1C: Recent Labs    10/15/19 1336  HGBA1C 8.1*   CBG: Recent Labs  Lab 10/17/19 0911 10/17/19 1147 10/17/19 1612 10/18/19 0825 10/18/19 1155  GLUCAP 130* 201* 213* 97 236*   Lipid Profile: No results for input(s): CHOL, HDL, LDLCALC, TRIG, CHOLHDL, LDLDIRECT in the last 72 hours. Thyroid Function Tests: No results for input(s): TSH, T4TOTAL, FREET4, T3FREE, THYROIDAB in the last 72 hours. Anemia Panel: No results for input(s): VITAMINB12, FOLATE, FERRITIN, TIBC, IRON, RETICCTPCT in the last 72  hours. Sepsis Labs: No results for input(s): PROCALCITON, LATICACIDVEN in the last 168 hours.  Recent Results (from the past 240 hour(s))  SARS CORONAVIRUS 2 (TAT 6-24 HRS) Nasopharyngeal Nasopharyngeal Swab     Status: None   Collection Time: 10/15/19  1:35 AM   Specimen: Nasopharyngeal Swab  Result Value Ref Range Status   SARS Coronavirus 2 NEGATIVE NEGATIVE Final    Comment: (NOTE) SARS-CoV-2 target nucleic acids are NOT DETECTED. The SARS-CoV-2 RNA is generally detectable in upper and lower respiratory specimens during the acute phase of infection. Negative results do not preclude SARS-CoV-2 infection, do not rule out co-infections with other pathogens, and should not be used as the sole basis for treatment or other patient management decisions. Negative results must be combined with clinical observations, patient history, and epidemiological information. The expected result is Negative. Fact Sheet for Patients: SugarRoll.be Fact Sheet for Healthcare Providers: https://www.woods-mathews.com/ This test is not yet approved or cleared by the [place] FDA and  has been authorized for detection and/or diagnosis of SARS-CoV-2 by FDA under an Emergency Use Authorization (EUA). This EUA will remain  in effect (meaning this test can be used) for the duration of the COVID-19 declaration under Section 56 4(b)(1) of the Act, 21 U.S.C. section 360bbb-3(b)(1), unless the authorization is terminated or revoked sooner. Performed at [facility], [place] [address] [place], [place] [address]          Radiology Studies: No results found.      Scheduled Meds: . apixaban  5 mg Oral BID  . aspirin EC  81 mg Oral Daily  . atorvastatin  80 mg Oral q1800  . carvedilol  25 mg Oral BID WC  . famotidine  20 mg Oral Daily  . finasteride  5 mg Oral Daily  . insulin aspart  0-15 Units Subcutaneous TID WC  . insulin detemir  30 Units  Subcutaneous Daily  . potassium chloride  20 mEq Oral Daily  . sodium chloride flush  3 mL Intravenous Q12H  . torsemide  40 mg Oral Daily   Continuous Infusions: . sodium chloride      Assessment & Plan:   Principal Problem:   Acute exacerbation of CHF (congestive heart failure) (HCC) Active Problems:   Anasarca   CAD (coronary artery disease)   Insulin dependent type 2 diabetes mellitus (HCC)   Essential hypertension   Chronic anticoagulation   Acute kidney injury superimposed on CKD (HCC)   CKD (chronic kidney disease) stage 3, GFR 30-59 ml/min   Generalized weakness   Frequent falls   Elevated liver enzymes   Acute on chronic diastolic CHF (congestive heart failure) (HCC)   Elevated LFTs  Acute exacerbation of CHF, suspect diastolic (congestive heart failure) ([place]) with  anasarca Improving slowly based on exam --  BNP was mildly elevated however patient is obese and this may be unreliable result  troponin negative x1  Echo EF 60 to 12%, grade 1 diastolic dysfunction Cardiology following Holding ACE inhibitors because of renal function --Daily weights salt restriction --Monitor input and output Continue Coreg covid negative. Cardiology following  Switch iv lasix to Torsemid 40 mg qd per nephrology      Accidental fall with history of frequent falls --patient reports slipping and falling in the bathroom but has been falling over the past few weeks --CT head negative. --Fall precautions -PT/OT -rec. SNF   Elevated liver enzymes --Secondary  to hepatic congestion from heart failure exacerbation --Continue to monitor.   Expect improvement as acute heart failure improves, if not will ck ruq [place]    CAD (coronary artery disease) --Denies complaints of chest pain --Continue home aspirin, Plavix Continue beta blk    Insulin dependent type 2 diabetes mellitus ([place]) --Mildly uncontrolled  -continue basal insulin but at reduced dose and with supplemental  insulin coverage --Hemoglobin A1c is 8.1    Essential hypertension --Blood pressure controlled.  Continue amlodipine and beta blk   chronic anticoagulation secondary to DVT -- Eliquis -no evidence of bleed. Will ck stool occult with current h/h    Acute kidney injury superimposed on CKD 3(HCC) --Creatinine elevated at 2 above baseline of 1.4 Nephrology following F/u renal US and urine studies pending. No iv contrast IV albumin per nephrology for hypoalbuminemia/anasarca Switch IV Lasix to torsemide   OSA-order CPAP here  Elevated PSA -  Follows [facility]ology at [facility] Bx was recommended in March...needs to f/u was outpt     DVT prophylaxis: Eliquis Code Status: full Family Communication: none at bedside Disposition Plan: likely be here 2 MN stays as he is volume overloaded and needs to be more medically stable.       LOS: 3 days   Time spent: 45 minutes with more than 50% COC    Nolberto Hanlon, MD [facility] Pager [phone]  If 7PM-7AM, please contact night-coverage [email or web address] Password [facility] 10/18/2019, 12:14 PM Patient ID: Lemmie Vanlanen, male   DOB: Feb 20, 1932, 86 y.o.   MRN: [id number]

## 2019-10-18 NOTE — Plan of Care (Signed)

## 2019-10-18 NOTE — NC FL2 (Signed)
[place] LEVEL OF CARE SCREENING TOOL     IDENTIFICATION  Patient Name: [name] Birthdate: 01/29/1932 Sex: male Admission Date (Current Location): 10/14/2019  [place] and [place] Number:  Engineering geologist and Address:  [facility], [address], [place], [place] [address]      Provider Number: [id number]  Attending Physician Name and Address:  Nolberto Hanlon, MD  Relative Name and Phone Number:       Current Level of Care: Hospital Recommended Level of Care: [place] Prior Approval Number:    Date Approved/Denied:   PASRR Number: [id number]  Discharge Plan: SNF    Current Diagnoses: Patient Active Problem List   Diagnosis Date Noted  . Anasarca 10/15/2019  . Acute exacerbation of CHF (congestive heart failure) ([place]) 10/15/2019  . CAD (coronary artery disease) 10/15/2019  . Insulin dependent type 2 diabetes mellitus ([place]) 10/15/2019  . Essential hypertension 10/15/2019  . Chronic anticoagulation 10/15/2019  . Acute kidney injury superimposed on CKD ([place]) 10/15/2019  . CKD (chronic kidney disease) stage 3, GFR 30-59 ml/min 10/15/2019  . Generalized weakness 10/15/2019  . Frequent falls 10/15/2019  . Elevated liver enzymes 10/15/2019  . Acute on chronic diastolic CHF (congestive heart failure) ([place]) 10/15/2019  . Elevated LFTs     Orientation RESPIRATION BLADDER Height & Weight     Self, Time, Situation, Place  Normal Incontinent Weight: 279 lb 6.4 oz (126.7 kg) Height:  5\' 11"  (180.3 cm)  BEHAVIORAL SYMPTOMS/MOOD NEUROLOGICAL BOWEL NUTRITION STATUS  (None)   Incontinent Diet(2 gram sodium.)  AMBULATORY STATUS COMMUNICATION OF NEEDS Skin   Limited Assist Verbally Normal                       Personal Care Assistance Level of Assistance  Bathing, Feeding, Dressing Bathing Assistance: Maximum assistance Feeding assistance: Limited assistance Dressing Assistance: Maximum assistance      Functional Limitations Info  Sight, Hearing, Speech Sight Info: Adequate Hearing Info: Adequate Speech Info: Adequate    SPECIAL CARE FACTORS FREQUENCY  PT (By licensed PT), OT (By licensed OT)     PT Frequency: 5 x week OT Frequency: 5 x week            Contractures Contractures Info: Not present    Additional Factors Info  Code Status, Allergies Code Status Info: Full code Allergies Info: Metformin and related.           Current Medications (10/18/2019):  This is the current hospital active medication list Current Facility-Administered Medications  Medication Dose Route Frequency Provider Last Rate Last Admin  . 0.9 %  sodium chloride infusion  250 mL Intravenous PRN Athena Masse, MD      . acetaminophen (TYLENOL) tablet 650 mg  650 mg Oral Q4H PRN Athena Masse, MD      . apixaban Arne [place]) tablet 5 mg  5 mg Oral BID Nolberto Hanlon, MD   5 mg at 10/18/19 0942  . aspirin EC tablet 81 mg  81 mg Oral Daily Athena Masse, MD   81 mg at 10/18/19 0277  . atorvastatin (LIPITOR) tablet 80 mg  80 mg Oral q1800 Athena Masse, MD   80 mg at 10/17/19 1703  . carvedilol (COREG) tablet 25 mg  25 mg Oral BID WC Athena Masse, MD   25 mg at 10/18/19 0746  . famotidine (PEPCID) tablet 20 mg  20 mg Oral Daily Nolberto Hanlon, MD  20 mg at 10/18/19 0942  . finasteride (PROSCAR) tablet 5 mg  5 mg Oral Daily Athena Masse, MD   5 mg at 10/18/19 0942  . insulin aspart (novoLOG) injection 0-15 Units  0-15 Units Subcutaneous TID WC Athena Masse, MD   5 Units at 10/18/19 1202  . insulin detemir (LEVEMIR) injection 30 Units  30 Units Subcutaneous Daily Athena Masse, MD   30 Units at 10/18/19 [phone]  . nitroGLYCERIN (NITROSTAT) SL tablet 0.4 mg  0.4 mg Sublingual PRN Athena Masse, MD      . ondansetron [facility]) injection 4 mg  4 mg Intravenous Q6H PRN Athena Masse, MD      . potassium chloride SA (KLOR-CON) CR tablet 20 mEq  20 mEq Oral Daily Murlean Iba, MD   20 mEq at  10/18/19 1137  . sodium chloride flush (NS) 0.9 % injection 3 mL  3 mL Intravenous Q12H Athena Masse, MD   3 mL at 10/17/19 2216  . sodium chloride flush (NS) 0.9 % injection 3 mL  3 mL Intravenous PRN Athena Masse, MD      . torsemide [facility]) tablet 40 mg  40 mg Oral Daily Murlean Iba, MD   40 mg at 10/18/19 1137     Discharge Medications: Please see discharge summary for a list of discharge medications.  Relevant Imaging Results:  Relevant Lab Results:   Additional Information SS#: [id number]  Candie Chroman, LCSW

## 2019-10-18 NOTE — Care Management Important Message (Signed)
Important Message  Patient Details  Name: [name] MRN: [id number] Date of Birth: 1932-07-30   Medicare Important Message Given:  Yes     Dannette Barbara 10/18/2019, 12:04 PM

## 2019-10-18 NOTE — Progress Notes (Addendum)
Shift summary:  - Patient states he does not intend to go to SNF at discharge. Will address on rounds.  - Patient participated with PT, OT today.   - Held PM dose of coreg for HR = 59.

## 2019-10-18 NOTE — TOC Initial Note (Addendum)
Transition of Care [facility]) - Initial/Assessment Note    Patient Details  Name: [name] MRN: [id number] Date of Birth: 1931-12-30  Transition of [facility]) CM/SW Contact:    Candie Chroman, LCSW Phone Number: 10/18/2019, 1:14 PM  Clinical Narrative: CSW met with patient. No supports at bedside. CSW introduced role and explained that PT recommendations would be discussed. Patient expressed that he was initially not agreeable to SNF placement. He wants CSW to discuss with his daughter. CSW tried calling but voicemail is full. He said she will be here around 1:30 today. Will follow up then. CSW left CMS scores for facilities within 25 miles of his zip code in his room to review. No further concerns. CSW encouraged patient to contact CSW as needed. CSW will continue to follow patient for support and facilitate discharge to SNF, if agreeable, once medically stable.                 2:14 pm: Received call back from daughter. She is willing to consider SNF placement. Discussed facilities in [facility] that are taking referrals at this time and provided CMS scores over the phone. She will be here later this afternoon. We will call her sister at that time so we can all discuss placement.  4:50 pm: Patient will discharge to [facility] when stable. Per MD, likely in two days. CSW faxed clinicals to [facility] for insurance authorization review. [place] requires a COVID test within 3 days of discharge. MD aware.  Expected Discharge Plan: Skilled Nursing Facility Barriers to Discharge: Continued Medical Work up, Ship broker   Patient Goals and CMS Choice   CMS Medicare.gov Compare Post Acute Care list provided to:: Patient    Expected Discharge Plan and Services Expected Discharge Plan: [place] arrangements for the past 2 months: Single Family Home                                      Prior Living  Arrangements/Services Living arrangements for the past 2 months: Single Family Home Lives with:: Adult Children Patient language and need for interpreter reviewed:: Yes Do you feel safe going back to the place where you live?: Yes      Need for Family Participation in Patient Care: Yes (Comment) Care giver support system in place?: Yes (comment) Current home services: Other (comment)(PCS) Criminal Activity/Legal Involvement Pertinent to Current Situation/Hospitalization: No - Comment as needed  Activities of Daily Living Home Assistive Devices/Equipment: Cane (specify quad or straight) ADL Screening (condition at time of admission) Patient's cognitive ability adequate to safely complete daily activities?: Yes Is the patient deaf or have difficulty hearing?: No Does the patient have difficulty seeing, even when wearing glasses/contacts?: No Does the patient have difficulty concentrating, remembering, or making decisions?: No Patient able to express need for assistance with ADLs?: Yes Does the patient have difficulty dressing or bathing?: No Independently performs ADLs?: Yes (appropriate for developmental age) Does the patient have difficulty walking or climbing stairs?: Yes Weakness of Legs: Both Weakness of Arms/Hands: None  Permission Sought/Granted Permission sought to share information with : Family Supports Permission granted to share information with : Yes, Verbal Permission Granted  Share Information with NAME: Arwin Bisceglia     Permission granted to share info w Relationship: Daughter  Permission granted to share info w Contact Information: [phone]  Emotional Assessment Appearance:: Appears stated age Attitude/Demeanor/Rapport: Engaged, Gracious Affect (typically observed): Appropriate, Calm, Pleasant Orientation: : Oriented to Place, Oriented to Self, Oriented to  Time, Oriented to Situation Alcohol / Substance Use: Not Applicable Psych Involvement: No  (comment)  Admission diagnosis:  Elevated LFTs [R79.89] Acute kidney injury ([place]) [N17.9] Acute on chronic diastolic CHF (congestive heart failure) (HCC) [I50.33] Acute renal failure, unspecified acute renal failure type (HCC) [N17.9] Acute on chronic congestive heart failure, unspecified heart failure type ([place]) [I50.9] Patient Active Problem List   Diagnosis Date Noted  . Anasarca 10/15/2019  . Acute exacerbation of CHF (congestive heart failure) ([place]) 10/15/2019  . CAD (coronary artery disease) 10/15/2019  . Insulin dependent type 2 diabetes mellitus ([place]) 10/15/2019  . Essential hypertension 10/15/2019  . Chronic anticoagulation 10/15/2019  . Acute kidney injury superimposed on CKD ([place]) 10/15/2019  . CKD (chronic kidney disease) stage 3, GFR 30-59 ml/min 10/15/2019  . Generalized weakness 10/15/2019  . Frequent falls 10/15/2019  . Elevated liver enzymes 10/15/2019  . Acute on chronic diastolic CHF (congestive heart failure) ([place]) 10/15/2019  . Elevated LFTs    PCP:  [name], PA-C Pharmacy:   [place], [address] [address] [place] [address] [facility] [place] [address] Phone: [phone] Fax: [phone]     Social Determinants of Health (SDOH) Interventions    Readmission Risk Interventions No flowsheet data found.

## 2019-10-18 NOTE — Progress Notes (Signed)
[facility], [place] 10/18/19  Subjective:   Hospital day # 3    12/28 0701 - 12/29 0700 In: 569 [P.O.:480; IV Piggyback:89] Out: 700 [Urine:700] Lab Results  Component Value Date   CREATININE 2.21 (H) 10/18/2019   CREATININE 2.26 (H) 10/17/2019   CREATININE 2.02 (H) 10/16/2019   Doing fair today Denies acute SOB  now has a foley catheter Sitting up and napping in recliner chair, OSA breathing pattern   Objective:  Vital signs in last 24 hours:  Temp:  [98 F (36.7 C)-98.5 F (36.9 C)] 98.5 F (36.9 C) (12/29 0825) Pulse Rate:  [60-67] 60 (12/29 0825) Resp:  [19-20] 19 (12/29 0825) BP: (125-162)/(54-56) 162/56 (12/29 0825) SpO2:  [96 %-99 %] 99 % (12/29 0825)  Weight change:  Filed Weights   10/15/19 1415 10/16/19 0410 10/17/19 0453  Weight: 129.7 kg 129.5 kg 126.7 kg    Intake/Output:    Intake/Output Summary (Last 24 hours) at 10/18/2019 1037 Last data filed at 10/18/2019 0900 Gross per 24 hour  Intake 299.71 ml  Output 700 ml  Net -400.29 ml     Physical Exam: General: NAD, sitting up in recliner chair  HEENT Anicteric, moist oral mucus membranes  Pulm/lungs Coarse breath sounds, Room air, no crackles or wheezing  CVS/Heart Regular, no rub  Abdomen:  Soft, obese, Non tender  Extremities: + dependent edema  Neurologic: Alert, able to answer questions appropriately  Skin: Changes from chronic edema bilaterally          Basic Metabolic Panel:  Recent Labs  Lab 10/14/19 2338 10/15/19 1336 10/16/19 0629 10/17/19 0528 10/18/19 0919  NA 133* 130* 130* 132* 130*  K 4.4 4.5 4.0 3.6 3.4*  CL 94* 91* 91* 92* 91*  CO2 27 26 26 28 26   GLUCOSE 79 223* 224* 120* 136*  BUN 23 27* 31* 32* 35*  CREATININE 2.04* 2.12* 2.02* 2.26* 2.21*  CALCIUM 9.4 8.8* 8.5* 9.0 9.1  PHOS  --   --  4.3  --   --      CBC: Recent Labs  Lab 10/14/19 2338 10/15/19 1336 10/16/19 0629 10/18/19 0448  WBC 10.9* 12.6* 12.3* 11.7*  HGB  10.2* 10.0* 9.3* 8.7*  HCT 30.6* 30.9* 27.8* 27.3*  MCV 81.0 84.2 80.6 85.3  PLT 356 299 307 317      Lab Results  Component Value Date   HEPBSAG NON REACTIVE 10/17/2019   HEPBSAB NON REACTIVE 10/17/2019   HEPBIGM NON REACTIVE 10/17/2019      Microbiology:  Recent Results (from the past 240 hour(s))  SARS CORONAVIRUS 2 (TAT 6-24 HRS) Nasopharyngeal Nasopharyngeal Swab     Status: None   Collection Time: 10/15/19  1:35 AM   Specimen: Nasopharyngeal Swab  Result Value Ref Range Status   SARS Coronavirus 2 NEGATIVE NEGATIVE Final    Comment: (NOTE) SARS-CoV-2 target nucleic acids are NOT DETECTED. The SARS-CoV-2 RNA is generally detectable in upper and lower respiratory specimens during the acute phase of infection. Negative results do not preclude SARS-CoV-2 infection, do not rule out co-infections with other pathogens, and should not be used as the sole basis for treatment or other patient management decisions. Negative results must be combined with clinical observations, patient history, and epidemiological information. The expected result is Negative. Fact Sheet for Patients: SugarRoll.be Fact Sheet for Healthcare Providers: https://www.woods-mathews.com/ This test is not yet approved or cleared by the [place] FDA and  has been authorized for detection and/or diagnosis of SARS-CoV-2  by FDA under an Emergency Use Authorization (EUA). This EUA will remain  in effect (meaning this test can be used) for the duration of the COVID-19 declaration under Section 56 4(b)(1) of the Act, 21 U.S.C. section 360bbb-3(b)(1), unless the authorization is terminated or revoked sooner. Performed at [facility], [place] [address] [place], [place] [address]     Coagulation Studies: No results for input(s): LABPROT, INR in the last 72 hours.  Urinalysis: No results for input(s): COLORURINE, LABSPEC, PHURINE, GLUCOSEU, HGBUR,  BILIRUBINUR, KETONESUR, PROTEINUR, UROBILINOGEN, NITRITE, LEUKOCYTESUR in the last 72 hours.  Invalid input(s): APPERANCEUR    Imaging: No results found.   Medications:   . sodium chloride     . apixaban  5 mg Oral BID  . aspirin EC  81 mg Oral Daily  . atorvastatin  80 mg Oral q1800  . carvedilol  25 mg Oral BID WC  . famotidine  20 mg Oral Daily  . finasteride  5 mg Oral Daily  . furosemide  40 mg Intravenous BID  . furosemide  40 mg Intravenous Once  . insulin aspart  0-15 Units Subcutaneous TID WC  . insulin detemir  30 Units Subcutaneous Daily  . sodium chloride flush  3 mL Intravenous Q12H   sodium chloride, acetaminophen, nitroGLYCERIN, ondansetron (ZOFRAN) IV, sodium chloride flush  Assessment/ Plan:  85 y.o.African American male  With grade 1 diastolic dysfunction (echo 09/2019), Moderate pulm HTN, Diabetes type 2 (and insulin dependent),  CAD, PVD, Gout, OSA, h/o DVT requiring chronic anticoagulation, h/o elevated PSA (followed by Dr Darcus Austin at [facility])  1. ARF on CKD st 3 A (Cr 1.4/GFR 53) on 07/09/2019 No recent iv contrast exposure No h/o excessive NSAIDs but was on voltaren gel as outpatient  Renal U.S neg for obstruction ? Over diuresis with lasix as outpatient Urinalysis, UPC, upep, spep pending -change iv lasix to oral torsemide  2. LE edema Fluid 3rd spacing vs lymphedema Undergoing careful diuresis Iv albumin supplementation while inpatient Diuretic changed to oral form  3. Elevated PSA - followed by oncologist at [place] - biopsy was recommended in March  4. Diabetes type 2 with CKD - insulin dependent - goal A1c 6.5-8% Lab Results  Component Value Date   HGBA1C 8.1 (H) 10/15/2019    5. Hypokalemia - likely due to diuresis - may supplement orally as necessary    LOS: [place] 12/29/202010:37 AM  [place], [place]  Note: This note was prepared with Dragon dictation. Any transcription  errors are unintentional

## 2019-10-18 NOTE — Evaluation (Signed)
Occupational Therapy Evaluation Patient Details Name: [name] MRN: [id number] DOB: 01/07/1932 Today's Date: 10/18/2019    History of Present Illness [name] is a 85 y.o. obese male lives at home with his daughter ambulate with a cane at baseline, with medical history significant for congestive heart failure, CKD 3, insulin-dependent type 2 diabetes, coronary artery disease, chronic anticoagulation secondary to history of DVT and history of frequent falls was brought to the emergency room thorugh EMS on 10/14/2019 because of generalized weakness, and multiple falls for the past few weeks. He was admitted to the hospital with diagnosis of acute on chronic diastolic dysfunction congestive heart failure, accidental fall with history of frequent falls, and elevated liver enzymes. Begin ambulation per cardiology note 10/17/2019.   Clinical Impression   Pt seen for OT evaluation this date. Per chart/pt, prior to hospital admission, pt was ambulating with [facility] and received assist for bathing from aides and daughter assisted with IADL.  Pt lives with his daughter in a 1 story home with 4 steps to enter. Pt denies difficulty at baseline with dressing himself. Currently pt demonstrates impairments in overall strength, balance, and insight into deficits requiring +2 physical assist for any mobility efforts and sit to stand ADL tasks. Pt demo'd difficulty just attempting to scoot back in recliner for improved positioning requiring extensive assist. Pt educated in falls prevention and edema mgt with maintaining BLE elevated when resting to minimize edema, as pt had RLE dangling down from foot rest in recliner upon OT's arrival. Pt verbalized understanding but would benefit from skilled OT to address noted impairments and functional limitations (see below for any additional details) and support recall and carryover in order to maximize safety and independence while minimizing falls risk and caregiver  burden.  Upon hospital discharge, recommend pt discharge to SNF.    Follow Up Recommendations  SNF    Equipment Recommendations  3 in 1 bedside commode    Recommendations for Other Services       Precautions / Restrictions Precautions Precautions: Fall Restrictions Weight Bearing Restrictions: No      Mobility Bed Mobility               General bed mobility comments: in recliner before and after  Transfers Overall transfer level: Needs assistance Equipment used: Rolling walker (2 wheeled) Transfers: Lateral/Scoot Transfers Sit to Stand: Mod assist;+2 physical assistance        Lateral/Scoot Transfers: +2 physical assistance General transfer comment: pt unable to scoot himself back in recliner without significant assist    Balance Overall balance assessment: Needs assistance Sitting-balance support: Bilateral upper extremity supported Sitting balance-Leahy Scale: Fair Sitting balance - Comments: able to sit on edge of recliner without support   Standing balance support: Bilateral upper extremity supported;During functional activity Standing balance-Leahy Scale: Poor Standing balance comment: heavily dependent upon RW for support during standing activities.                           ADL either performed or assessed with clinical judgement   ADL Overall ADL's : Needs assistance/impaired Eating/Feeding: Set up;Sitting   Grooming: Sitting;Set up   Upper Body Bathing: Sitting;Moderate assistance   Lower Body Bathing: Sitting/lateral leans;Maximal assistance   Upper Body Dressing : Sitting;Moderate assistance   Lower Body Dressing: Sitting/lateral leans;Maximal assistance   Toilet Transfer: +2 for physical assistance  Vision Patient Visual Report: No change from baseline       Perception     Praxis      Pertinent Vitals/Pain Pain Assessment: No/denies pain     Hand Dominance Right   Extremity/Trunk  Assessment Upper Extremity Assessment Upper Extremity Assessment: Generalized weakness   Lower Extremity Assessment Lower Extremity Assessment: Generalized weakness   Cervical / Trunk Assessment Cervical / Trunk Assessment: Kyphotic   Communication Communication Communication: No difficulties(sleepy)   Cognition Arousal/Alertness: Awake/alert Behavior During Therapy: WFL for tasks assessed/performed Overall Cognitive Status: No family/caregiver present to determine baseline cognitive functioning                                 General Comments: oriented to self and generally to situation, follows commands with additional time, mildly inappropriate during session   General Comments       Exercises Other Exercises Other Exercises: pt instructed in functional transfer training from recliner but unable to lift buttocks from recliner seat, requiring significant assist Other Exercises: Pt instructed in need to elevate BLE while resting after pt found with RLE dangling off recliner rest rest and swelling/edema noted to be slightly more than LLE   Shoulder Instructions      Home Living Family/patient expects to be discharged to:: Private residence Living Arrangements: Children Available Help at Discharge: Family;Personal care attendant Type of Home: House Home Access: Stairs to enter Technical brewer of Steps: 3 Entrance Stairs-Rails: Left;Right Home Layout: One level     Bathroom Shower/Tub: Walk-in shower         Home Equipment: Shower seat;Cane - single point   Additional Comments: Pt states he has a shower chair and three canes. One cane is in the room is SPC, cannot describe the other two. Denies having walker, BSC, w/c, or hospital bed.      Prior Functioning/Environment Level of Independence: Independent with assistive device(s)        Comments: Pt reports he ambulated in the home and the community with [place]. A caregiver comes to the home 3x a  week to help him with bathing and other personal care and daughter assists with housework and errands.        OT Problem List: Decreased strength;Increased edema;Decreased activity tolerance;Decreased safety awareness;Impaired balance (sitting and/or standing);Decreased knowledge of use of DME or AE;Obesity      OT Treatment/Interventions: Self-care/ADL training;Therapeutic exercise;Therapeutic activities;DME and/or AE instruction;Patient/family education;Balance training    OT Goals(Current goals can be found in the care plan section) Acute Rehab OT Goals Patient Stated Goal: return home OT Goal Formulation: With patient Time For Goal Achievement: 11/01/19 Potential to Achieve Goals: Good ADL Goals Pt Will Perform Lower Body Dressing: with mod assist;sit to/from stand Pt Will Transfer to Toilet: with mod assist;bedside commode;ambulating(LRADfor amb)  OT Frequency: Min 1X/week   Barriers to D/C:            Co-evaluation              AM-PAC OT "6 Clicks" Daily Activity     Outcome Measure Help from another person eating meals?: None Help from another person taking care of personal grooming?: None Help from another person toileting, which includes using toliet, bedpan, or urinal?: Total Help from another person bathing (including washing, rinsing, drying)?: A Lot Help from another person to put on and taking off regular upper body clothing?: A Lot Help from another person to put on  and taking off regular lower body clothing?: A Lot 6 Click Score: 15   End of Session    Activity Tolerance: Patient tolerated treatment well;Patient limited by fatigue Patient left: with call bell/phone within reach;in chair;with chair alarm set  OT Visit Diagnosis: Other abnormalities of gait and mobility (R26.89);Muscle weakness (generalized) (M62.81)                Time: 6816-6196 OT Time Calculation (min): 13 min Charges:  OT General Charges $OT Visit: 1 Visit OT Evaluation $OT  Eval Moderate Complexity: 1 Mod  Jeni Salles, MPH, MS, OTR/L ascom [phone] 10/18/19, 12:07 PM

## 2019-10-18 NOTE — Progress Notes (Signed)
Physical Therapy Treatment Patient Details Name: [name] MRN: [id number] DOB: 1931-12-08 Today's Date: 10/18/2019    History of Present Illness [name] is a 85 y.o. obese male lives at home with his daughter ambulate with a cane at baseline, with medical history significant for congestive heart failure, CKD 3, insulin-dependent type 2 diabetes, coronary artery disease, chronic anticoagulation secondary to history of DVT and history of frequent falls was brought to the emergency room thorugh EMS on 10/14/2019 because of generalized weakness, and multiple falls for the past few weeks. He was admitted to the hospital with diagnosis of acute on chronic diastolic dysfunction congestive heart failure, accidental fall with history of frequent falls, and elevated liver enzymes. Begin ambulation per cardiology note 10/17/2019.    PT Comments    Pt in recliner, initially declines session but agreed with moderate encouragement.  Contacted by RN as pt had reported he did not want to go to rehab.  After education he agreed.  Stood with mod a x 2 and significant post lean initially with recliner providing LE support.  Mod verbal cues to lean forward on walker and pt aware that without outside help he would fall.  Given time, he is able to gain balance and make a 5' circle in his room with min a x 2.  Very slow guarded steps with decreased height and length.  While turning back to chair, pt does loose his balance and is guided back to sitting which would have resulted in a fall if he did not have +2 assist and recliner behind him.  Pt stated turn was which caused his LOB.    Discussed discharge plan.  Pt admits he would struggle at home and that falls were frequent and given increased weakness that his fall risk was increased.  He asked questions regarding rehab and answered as appropriate.  He has 3 steps with rails into his home that would be challenging for him at this time.  He is re-considering  his initial refusal of rehab but would like to talk with his family before deciding.  Discussed with RN.     Follow Up Recommendations  SNF     Equipment Recommendations  Rolling walker with 5" wheels;3in1 (PT);Hospital bed    Recommendations for Other Services       Precautions / Restrictions Precautions Precautions: Fall Restrictions Weight Bearing Restrictions: No    Mobility  Bed Mobility               General bed mobility comments: in recliner before and after  Transfers Overall transfer level: Needs assistance Equipment used: Rolling walker (2 wheeled) Transfers: Sit to/from Stand Sit to Stand: Mod assist;+2 physical assistance            Ambulation/Gait Ambulation/Gait assistance: Min assist Gait Distance (Feet): 5 Feet Assistive device: Rolling walker (2 wheeled) Gait Pattern/deviations: Step-through pattern;Decreased step length - right;Decreased step length - left Gait velocity: decreased   General Gait Details: very slow guarded gait with posterior lean and cues to lean forward on walker   Stairs             Wheelchair Mobility    Modified Rankin (Stroke Patients Only)       Balance Overall balance assessment: Needs assistance Sitting-balance support: Bilateral upper extremity supported Sitting balance-Leahy Scale: Fair Sitting balance - Comments: able to sit on edge of recliner without support   Standing balance support: Bilateral upper extremity supported;During functional activity Standing balance-Leahy Scale: Poor Standing balance  comment: heavily dependent upon RW for support during standing activities.                            Cognition Arousal/Alertness: Awake/alert Behavior During Therapy: WFL for tasks assessed/performed Overall Cognitive Status: Within Functional Limits for tasks assessed                                        Exercises      General Comments        Pertinent  Vitals/Pain Pain Assessment: No/denies pain    Home Living                      Prior Function            PT Goals (current goals can now be found in the care plan section) Progress towards PT goals: Progressing toward goals    Frequency    Min 2X/week      PT Plan Current plan remains appropriate    Co-evaluation              AM-PAC PT "6 Clicks" Mobility   Outcome Measure  Help needed turning from your back to your side while in a flat bed without using bedrails?: A Lot Help needed moving from lying on your back to sitting on the side of a flat bed without using bedrails?: A Lot Help needed moving to and from a bed to a chair (including a wheelchair)?: A Lot Help needed standing up from a chair using your arms (e.g., wheelchair or bedside chair)?: A Lot Help needed to walk in hospital room?: A Lot Help needed climbing 3-5 steps with a railing? : Total 6 Click Score: 11    End of Session Equipment Utilized During Treatment: Gait belt Activity Tolerance: Patient limited by fatigue;Patient tolerated treatment well Patient left: in chair;with call bell/phone within reach;with chair alarm set Nurse Communication: Mobility status;Other (comment)       Time: 1224-8250 PT Time Calculation (min) (ACUTE ONLY): 19 min  Charges:  $Gait Training: 8-22 mins                    Chesley Noon, PTA 10/18/19, 10:44 AM

## 2019-10-18 NOTE — Plan of Care (Signed)
  Problem: Safety: Goal: Ability to remain free from injury will improve Outcome: Progressing   Problem: Skin Integrity: Goal: Risk for impaired skin integrity will decrease Outcome: Progressing   

## 2019-10-18 NOTE — Progress Notes (Signed)
[facility] Encounter Note  Patient: [name] / Admit Date: 10/14/2019 / Date of Encounter: 10/18/2019, 7:55 AM   Subjective: Patient is slightly better today than yesterday.  Feeling better and hemodynamically with good pulse and heart rate and blood pressure.  Patient has lost some weight with diuresis although continuing to have difficulty with primary cause including anemia chronic kidney disease and diastolic dysfunction heart failure.  Echocardiogram showing normal LV systolic function with ejection fraction 65% and no evidence of significant valvular heart disease.  Telemetry showing normal sinus rhythm.  Review of Systems: Positive for: Shortness of breath Negative for: Vision change, hearing change, syncope, dizziness, nausea, vomiting,diarrhea, bloody stool, stomach pain, cough, congestion, diaphoresis, urinary frequency, urinary pain,skin lesions, skin rashes Others previously listed  Objective: Telemetry: Normal sinus rhythm Physical Exam: Blood pressure (!) 128/54, pulse 61, temperature 98 F (36.7 C), temperature source Oral, resp. rate 20, height 5\' 11"  (1.803 m), weight 126.7 kg, SpO2 99 %. Body mass index is 38.97 kg/m. General: Well developed, well nourished, in no acute distress. Head: Normocephalic, atraumatic, sclera non-icteric, no xanthomas, nares are without discharge. Neck: No apparent masses Lungs: Normal respirations with no wheezes, some rhonchi, no rales , few crackles   Heart: Regular rate and rhythm, normal S1 S2, no murmur, no rub, no gallop, PMI is normal size and placement, carotid upstroke normal without bruit, jugular venous pressure normal Abdomen: Soft, non-tender,  distended with normoactive bowel sounds. No hepatosplenomegaly. Abdominal aorta is normal size without bruit Extremities: 1+ edema, no clubbing, no cyanosis, no ulcers,  Peripheral: 2+ radial, 2+ femoral, 2+ dorsal pedal pulses Neuro: Alert and oriented. Moves  all extremities spontaneously. Psych:  Responds to questions appropriately with a normal affect.   Intake/Output Summary (Last 24 hours) at 10/18/2019 0755 Last data filed at 10/18/2019 0555 Gross per 24 hour  Intake 568.99 ml  Output 700 ml  Net -131.01 ml    Inpatient Medications:  . apixaban  5 mg Oral BID  . aspirin EC  81 mg Oral Daily  . atorvastatin  80 mg Oral q1800  . carvedilol  25 mg Oral BID WC  . famotidine  20 mg Oral Daily  . finasteride  5 mg Oral Daily  . furosemide  40 mg Intravenous BID  . furosemide  40 mg Intravenous Once  . insulin aspart  0-15 Units Subcutaneous TID WC  . insulin detemir  30 Units Subcutaneous Daily  . sodium chloride flush  3 mL Intravenous Q12H   Infusions:  . sodium chloride      Labs: Recent Labs    10/16/19 0629 10/17/19 0528  NA 130* 132*  K 4.0 3.6  CL 91* 92*  CO2 26 28  GLUCOSE 224* 120*  BUN 31* 32*  CREATININE 2.02* 2.26*  CALCIUM 8.5* 9.0  PHOS 4.3  --    Recent Labs    10/16/19 0629 10/17/19 0528  AST  --  43*  ALT  --  71*  ALKPHOS  --  146*  BILITOT  --  1.2  PROT  --  6.9  ALBUMIN 2.7* 3.2*   Recent Labs    10/16/19 0629 10/18/19 0448  WBC 12.3* 11.7*  HGB 9.3* 8.7*  HCT 27.8* 27.3*  MCV 80.6 85.3  PLT 307 317   No results for input(s): CKTOTAL, CKMB, TROPONINI in the last 72 hours. Invalid input(s): POCBNP Recent Labs    10/15/19 1336  HGBA1C 8.1*     Weights: [employer]  10/15/19 1415 10/16/19 0410 10/17/19 0453  Weight: 129.7 kg 129.5 kg 126.7 kg     Radiology/Studies:  CT HEAD WO CONTRAST  Result Date: 10/15/2019 CLINICAL DATA:  Head trauma EXAM: CT HEAD WITHOUT CONTRAST TECHNIQUE: Contiguous axial images were obtained from the base of the skull through the vertex without intravenous contrast. COMPARISON:  None. FINDINGS: Brain: No evidence of acute territorial infarction, hemorrhage, hydrocephalus,extra-axial collection or mass lesion/mass effect. There is dilatation  the ventricles and sulci consistent with age-related atrophy. Low-attenuation changes in the deep white matter consistent with small vessel ischemia. Vascular: No hyperdense vessel or unexpected calcification. Skull: The skull is intact. No fracture or focal lesion identified. Sinuses/Orbits: The visualized paranasal sinuses and mastoid air cells are clear. The orbits and globes intact. Other: None IMPRESSION: No acute intracranial abnormality. Findings consistent with age related atrophy and chronic small vessel ischemia Electronically Signed   By: Prudencio Pair M.D.   On: 10/15/2019 03:49   US RENAL  Result Date: 10/16/2019 CLINICAL DATA:  Acute renal failure, chronic kidney disease stage 3. EXAM: RENAL / URINARY TRACT ULTRASOUND COMPLETE COMPARISON:  None. FINDINGS: Right Kidney: Renal measurements: 10.1 x 6.3 x 6.4 cm = volume: 215 mL. Mild increased cortical echogenicity. No mass or hydronephrosis visualized. 6.3 cm cyst over the mid pole. Left Kidney: Renal measurements: 9.5 x 6.1 x 6.3 cm = volume: 193 mL. Mild increased cortical echogenicity. No mass or hydronephrosis visualized. Bladder: Appears normal for degree of bladder distention. Other: None. IMPRESSION: 1. Normal size kidneys without hydronephrosis. Mild increased cortical echogenicity which can be seen with medical renal disease. 2.  6.3 cm right renal cyst. Electronically Signed   By: [name] M.D.   On: 10/16/2019 09:17   DG Chest Portable 1 View  Result Date: 10/15/2019 CLINICAL DATA:  Shortness of breath, wheezing, positive CHF EXAM: PORTABLE CHEST 1 VIEW COMPARISON:  Radiograph 12/18/2018 FINDINGS: Hazy interstitial opacities more pronounced towards the lung bases with some bandlike areas of opacity likely reflecting superimposed subsegmental atelectasis and/or scarring. Mild cephalization of the pulmonary vascularity with some fissural thickening. Cardiomegaly is similar to comparison accounting for differences in technique. No  visible effusion or pneumothorax. No acute osseous or soft tissue abnormality. Telemetry leads overlie the chest. IMPRESSION: Features compatible with CHF including cardiomegaly and mild interstitial edema. More bandlike opacities likely reflect subsegmental atelectasis or scarring. Electronically Signed   By: Lovena Le M.D.   On: 10/15/2019 00:06   ECHOCARDIOGRAM COMPLETE  Result Date: 10/15/2019   ECHOCARDIOGRAM REPORT   Patient Name:   [name] Date of Exam: 10/15/2019 Medical Rec #:  [id number]       Height:       71.0 in Accession #:    [id number]      Weight:       275.0 lb Date of Birth:  04/12/1932       BSA:          2.41 m Patient Age:    66 years        BP:           140/60 mmHg Patient Gender: M               HR:           67 bpm. Exam Location:  [facility] Procedure: 2D Echo, Color Doppler and Cardiac Doppler Indications:     I50.9 Congestive Heart Failure  History:         Patient has no prior  history of Echocardiogram examinations.                  CAD and Previous Myocardial Infarction, PVD and COPD; Risk                  Factors:Diabetes, Hypertension and Dyslipidemia.  Sonographer:     Charmayne Sheer RDCS (AE) Referring Phys:  [id number] Athena Masse Diagnosing Phys: Ida Rogue MD  Sonographer Comments: Suboptimal parasternal window and no subcostal window. Pt moving throughout study due to SOB and Image acquisition challenging due to COPD. IMPRESSIONS  1. Left ventricular ejection fraction, by visual estimation, is 60 to 65%. The left ventricle has normal function. There is no left ventricular hypertrophy.  2. Left ventricular diastolic parameters are consistent with Grade I diastolic dysfunction (impaired relaxation).  3. The left ventricle has no regional wall motion abnormalities.  4. Global right ventricle has normal systolic function.The right ventricular size is normal. No increase in right ventricular wall thickness.  5. Left atrial size was mildly dilated.  6. Moderately elevated  pulmonary artery systolic pressure.  7. The tricuspid regurgitant velocity is 3.12 m/s, and with an assumed right atrial pressure of 10 mmHg, the estimated right ventricular systolic pressure is moderately elevated at 48.9 mmHg. FINDINGS  Left Ventricle: Left ventricular ejection fraction, by visual estimation, is 60 to 65%. The left ventricle has normal function. The left ventricle has no regional wall motion abnormalities. There is no left ventricular hypertrophy. Left ventricular diastolic parameters are consistent with Grade I diastolic dysfunction (impaired relaxation). Normal left atrial pressure. Right Ventricle: The right ventricular size is normal. No increase in right ventricular wall thickness. Global RV systolic function is has normal systolic function. The tricuspid regurgitant velocity is 3.12 m/s, and with an assumed right atrial pressure  of 10 mmHg, the estimated right ventricular systolic pressure is moderately elevated at 48.9 mmHg. Left Atrium: Left atrial size was mildly dilated. Right Atrium: Right atrial size was normal in size Pericardium: There is no evidence of pericardial effusion. Mitral Valve: The mitral valve is normal in structure. Mild mitral valve regurgitation. No evidence of mitral valve stenosis by observation. MV peak gradient, 4.6 mmHg. Tricuspid Valve: The tricuspid valve is normal in structure. Tricuspid valve regurgitation is not demonstrated. Aortic Valve: The aortic valve was not well visualized. Aortic valve regurgitation is not visualized. Mild aortic valve sclerosis is present, with no evidence of aortic valve stenosis. Aortic valve mean gradient measures 7.0 mmHg. Aortic valve peak gradient measures 11.6 mmHg. Aortic valve area, by VTI measures 3.20 cm. Pulmonic Valve: The pulmonic valve was normal in structure. Pulmonic valve regurgitation is not visualized. Pulmonic regurgitation is not visualized. Aorta: The aortic root, ascending aorta and aortic arch are all  structurally normal, with no evidence of dilitation or obstruction. Venous: The inferior vena cava is normal in size with greater than 50% respiratory variability, suggesting right atrial pressure of 3 mmHg. IAS/Shunts: No atrial level shunt detected by color flow Doppler. There is no evidence of a patent foramen ovale. No ventricular septal defect is seen or detected. There is no evidence of an atrial septal defect.  LEFT VENTRICLE PLAX 2D LVIDd:         4.58 cm  Diastology LVIDs:         3.28 cm  LV e' lateral:   9.36 cm/s LV PW:         1.15 cm  LV E/e' lateral: 9.2 LV IVS:  0.82 cm  LV e' medial:    6.64 cm/s LVOT diam:     2.40 cm  LV E/e' medial:  13.0 LV SV:         53 ml LV SV Index:   20.73 LVOT Area:     4.52 cm  RIGHT VENTRICLE RV Basal diam:  3.04 cm LEFT ATRIUM             Index       RIGHT ATRIUM           Index LA diam:        4.40 cm 1.82 cm/m  RA Area:     12.60 cm LA Vol (A2C):   62.9 ml 26.05 ml/m RA Volume:   27.60 ml  11.43 ml/m LA Vol (A4C):   52.2 ml 21.62 ml/m LA Biplane Vol: 57.9 ml 23.98 ml/m  AORTIC VALVE                    PULMONIC VALVE AV Area (Vmax):    3.41 cm     PV Vmax:       1.04 m/s AV Area (Vmean):   3.30 cm     PV Vmean:      71.000 cm/s AV Area (VTI):     3.20 cm     PV VTI:        0.264 m AV Vmax:           170.00 cm/s  PV Peak grad:  4.3 mmHg AV Vmean:          125.000 cm/s PV Mean grad:  2.0 mmHg AV VTI:            0.401 m AV Peak Grad:      11.6 mmHg AV Mean Grad:      7.0 mmHg LVOT Vmax:         128.00 cm/s LVOT Vmean:        91.100 cm/s LVOT VTI:          0.284 m LVOT/AV VTI ratio: 0.71  AORTA Ao Root diam: 3.30 cm MITRAL VALVE                        TRICUSPID VALVE MV Area (PHT): 3.53 cm             TR Peak grad:   38.9 mmHg MV Peak grad:  4.6 mmHg             TR Vmax:        312.00 cm/s MV Mean grad:  2.0 mmHg MV Vmax:       1.07 m/s             SHUNTS MV Vmean:      68.9 cm/s            Systemic VTI:  0.28 m MV VTI:        0.38 m                Systemic Diam: 2.40 cm MV PHT:        62.35 msec MV Decel Time: 215 msec MV E velocity: 86.30 cm/s 103 cm/s MV A velocity: 94.10 cm/s 70.3 cm/s MV E/A ratio:  0.92       1.5  Ida Rogue MD Electronically signed by Ida Rogue MD Signature Date/Time: 10/15/2019/3:57:33 PM    Final      Assessment and Recommendation  85 y.o. male with acute  on chronic diastolic dysfunction congestive heart failure multifactorial in nature including anemia hypertension diabetes and chronic kidney disease slightly improved with diuresis without evidence of myocardial infarction 1.  Continue diuresis with intravenous Lasix as per nephrology is okay watching closely for worsening chronic kidney disease exacerbation 2.  Continue carvedilol for cardiomyopathy and congestive heart failure as well as hypertension control without change 3.  Eliquis for further risk reduction of deep venous thrombosis although may need to discontinue at this time due to anemia if patient is bleeding or having significant anemia due to this cause 4.  No further cardiac intervention or diagnostics necessary at this time 5.  Begin ambulation and follow for improvements of symptoms need for further adjustments as above  Signed, Serafina Royals M.D. FACC

## 2019-10-19 LAB — GLUCOSE, CAPILLARY
Glucose-Capillary: 116 mg/dL — ABNORMAL HIGH (ref 70–99)
Glucose-Capillary: 178 mg/dL — ABNORMAL HIGH (ref 70–99)
Glucose-Capillary: 176 mg/dL — ABNORMAL HIGH (ref 70–99)

## 2019-10-19 LAB — BASIC METABOLIC PANEL
Anion gap: 14 (ref 5–15)
BUN: 36 mg/dL — ABNORMAL HIGH (ref 8–23)
CO2: 27 mmol/L (ref 22–32)
Calcium: 9.3 mg/dL (ref 8.9–10.3)
Chloride: 92 mmol/L — ABNORMAL LOW (ref 98–111)
Creatinine, Ser: 2.24 mg/dL — ABNORMAL HIGH (ref 0.61–1.24)
GFR calc Af Amer: 30 mL/min — ABNORMAL LOW (ref >60)
GFR calc non Af Amer: 26 mL/min — ABNORMAL LOW (ref >60)
Glucose, Bld: 110 mg/dL — ABNORMAL HIGH (ref 70–99)
Potassium: 3.5 mmol/L (ref 3.5–5.1)
Sodium: 133 mmol/L — ABNORMAL LOW (ref 135–145)

## 2019-10-19 LAB — CBC
HCT: 29.8 % — ABNORMAL LOW (ref 39.0–52.0)
Hemoglobin: 9.5 g/dL — ABNORMAL LOW (ref 13.0–17.0)
MCH: 27.1 pg (ref 26.0–34.0)
MCHC: 31.9 g/dL (ref 30.0–36.0)
MCV: 85.1 fL (ref 80.0–100.0)
Platelets: 341 10*3/uL (ref 150–400)
RBC: 3.5 MIL/uL — ABNORMAL LOW (ref 4.22–5.81)
RDW: 14 % (ref 11.5–15.5)
WBC: 10.9 10*3/uL — ABNORMAL HIGH (ref 4.0–10.5)
nRBC: 0 % (ref 0.0–0.2)

## 2019-10-19 LAB — MAGNESIUM: Magnesium: 2.5 mg/dL — ABNORMAL HIGH (ref 1.7–2.4)

## 2019-10-19 NOTE — Progress Notes (Signed)
Physical Therapy Treatment Patient Details Name: [name] MRN: [id number] DOB: 1932-07-03 Today's Date: 10/19/2019    History of Present Illness [name] is a 85 y.o. obese male lives at home with his daughter ambulate with a cane at baseline, with medical history significant for congestive heart failure, CKD 3, insulin-dependent type 2 diabetes, coronary artery disease, chronic anticoagulation secondary to history of DVT and history of frequent falls was brought to the emergency room thorugh EMS on 10/14/2019 because of generalized weakness, and multiple falls for the past few weeks. He was admitted to the hospital with diagnosis of acute on chronic diastolic dysfunction congestive heart failure, accidental fall with history of frequent falls, and elevated liver enzymes. Begin ambulation per cardiology note 10/17/2019.    PT Comments    Pt resting in recliner upon PT arrival and reports NT wanted therapist to call when they arrived so NT could finish pt's bath in standing (to address pt's backside).  Able to stand up to walker x2 trials with mod assist x2.  1st trial standing NT present to assist with finishing pt's bath (pt fatigued in standing requiring a sitting rest break).  2nd trial standing pt able to march in place 10 reps B LE's x2 trials with min assist x2 and walker use.  Pt with posterior lean in standing requiring cueing and assist to shift weight forward consistently.  Will continue to focus on strengthening and progressive functional mobility per pt tolerance.   Follow Up Recommendations  SNF     Equipment Recommendations  Rolling walker with 5" wheels;3in1 (PT);Hospital bed;Wheelchair (measurements PT);Wheelchair cushion (measurements PT)    Recommendations for Other Services OT consult     Precautions / Restrictions Precautions Precautions: Fall Restrictions Weight Bearing Restrictions: No    Mobility  Bed Mobility               General bed  mobility comments: Deferred (pt in recliner upon PT arrival and wanted to stay in recliner--pt reports sleeping in recliner at home)  Transfers Overall transfer level: Needs assistance Equipment used: Rolling walker (2 wheeled) Transfers: Sit to/from Stand Sit to Stand: Mod assist;+2 physical assistance         General transfer comment: x2 trials standing; vc's for UE/LE positioning and scooting to edge of chair prior to standing; assist to initiate and come to full stand with 2 assist; vc's for UE placement (reach back for chair arms) to assist with controlled descent sitting in recliner  Ambulation/Gait Ambulation/Gait assistance: Min assist;+2 physical assistance Gait Distance (Feet): (marching in place 10 reps B LE's x2 trials) Assistive device: Rolling walker (2 wheeled)   Gait velocity: decreased   General Gait Details: minimal foot clearance with initial steps in place that decreased with repetition (pt appeared to fatigue); posterior lean noted requiring cueing to shift weight forward onto walker   Stairs             Wheelchair Mobility    Modified Rankin (Stroke Patients Only)       Balance Overall balance assessment: Needs assistance Sitting-balance support: No upper extremity supported;Feet supported Sitting balance-Leahy Scale: Good Sitting balance - Comments: steady sitting reaching within BOS Postural control: Posterior lean Standing balance support: Bilateral upper extremity supported Standing balance-Leahy Scale: Zero Standing balance comment: pt with posterior lean in standing requiring at a minimum of min assist to maintain standing balance (B LE's pushing against chair to maintain upright requiring cueing to shift weight forward and lean forward with UE's onto  walker)                            Cognition Arousal/Alertness: Awake/alert Behavior During Therapy: WFL for tasks assessed/performed Overall Cognitive Status: Within Functional  Limits for tasks assessed                                 General Comments: mild increased time to follow 1 step commands      Exercises      General Comments General comments (skin integrity, edema, etc.): B LE's edematous.  Nursing cleared pt for participation in physical therapy.  Pt agreeable to PT session.      Pertinent Vitals/Pain Pain Assessment: No/denies pain  Vitals (HR and O2 on room air) stable and WFL throughout treatment session.    Home Living                      Prior Function            PT Goals (current goals can now be found in the care plan section) Acute Rehab PT Goals Patient Stated Goal: return home PT Goal Formulation: With patient Time For Goal Achievement: 10/31/19 Potential to Achieve Goals: Good Progress towards PT goals: Progressing toward goals    Frequency    Min 2X/week      PT Plan Current plan remains appropriate    Co-evaluation              AM-PAC PT "6 Clicks" Mobility   Outcome Measure  Help needed turning from your back to your side while in a flat bed without using bedrails?: A Lot Help needed moving from lying on your back to sitting on the side of a flat bed without using bedrails?: A Lot Help needed moving to and from a bed to a chair (including a wheelchair)?: Total Help needed standing up from a chair using your arms (e.g., wheelchair or bedside chair)?: Total Help needed to walk in hospital room?: Total Help needed climbing 3-5 steps with a railing? : Total 6 Click Score: 8    End of Session Equipment Utilized During Treatment: Gait belt Activity Tolerance: Patient limited by fatigue Patient left: in chair;with call bell/phone within reach;with chair alarm set;with nursing/sitter in room Nurse Communication: Mobility status;Precautions(NT present and aware; updated white board) PT Visit Diagnosis: Unsteadiness on feet (R26.81);History of falling (Z91.81);Difficulty in walking, not  elsewhere classified (R26.2);Muscle weakness (generalized) (M62.81)     Time: 5208-0223 PT Time Calculation (min) (ACUTE ONLY): 23 min  Charges:  $Therapeutic Activity: 23-37 mins                     Leitha Bleak, PT 10/19/19, 11:27 AM

## 2019-10-19 NOTE — TOC Progression Note (Addendum)
Transition of Care [facility]) - Progression Note    Patient Details  Name: [name] MRN: [id number] Date of Birth: 1931-12-28  Transition of Care [facility]) CM/SW [place], LCSW Phone Number: 10/19/2019, 1:41 PM  Clinical Narrative: Insurance authorization approved: [id number]. Good until tomorrow. MD aware.    4:20 pm: Per MD, plan for discharge tomorrow. He will order a new COVID test. SNF and daughter are aware. Daughter wants to coordinate time to call EMS so she can be here when they arrive and can follow them to the facility.  Expected Discharge Plan: [place] Barriers to Discharge: Continued Medical Work up, Ship broker  Expected Discharge Plan and Services Expected Discharge Plan: [place] arrangements for the past 2 months: Single Family Home                                       Social Determinants of Health (SDOH) Interventions    Readmission Risk Interventions No flowsheet data found.

## 2019-10-19 NOTE — Progress Notes (Signed)
[facility], [place] 10/19/19  Subjective:   Hospital day # 4    12/29 0701 - 12/30 0700 In: 210.7 [P.O.:120; IV Piggyback:90.7] Out: 1150 [Urine:1150] Lab Results  Component Value Date   CREATININE 2.24 (H) 10/19/2019   CREATININE 2.21 (H) 10/18/2019   CREATININE 2.26 (H) 10/17/2019   Doing fair today Denies acute SOB  now has a foley catheter Sitting up  in recliner chair,  States he is able to eat without nausea or vomiting SPEP + for M Spike   Objective:  Vital signs in last 24 hours:  Temp:  [98 F (36.7 C)-98.3 F (36.8 C)] 98.2 F (36.8 C) (12/30 0742) Pulse Rate:  [55-65] 55 (12/30 0742) Resp:  [18-20] 18 (12/30 0742) BP: (123-153)/(63-78) 153/75 (12/30 0742) SpO2:  [95 %-99 %] 99 % (12/30 0742) Weight:  [122.6 kg] 122.6 kg (12/30 0539)  Weight change:  Filed Weights   10/16/19 0410 10/17/19 0453 10/19/19 0539  Weight: 129.5 kg 126.7 kg 122.6 kg    Intake/Output:    Intake/Output Summary (Last 24 hours) at 10/19/2019 1022 Last data filed at 10/19/2019 0636 Gross per 24 hour  Intake 120 ml  Output 1150 ml  Net -1030 ml     Physical Exam: General: NAD, sitting up in recliner chair  HEENT Anicteric, moist oral mucus membranes  Pulm/lungs Coarse breath sounds, Room air, no crackles or wheezing  CVS/Heart Regular, no rub  Abdomen:  Soft, obese, Non tender  Extremities: + dependent edema  Neurologic: Alert, able to answer questions appropriately  Skin: Changes from chronic edema bilaterally          Basic Metabolic Panel:  Recent Labs  Lab 10/15/19 1336 10/16/19 0629 10/17/19 0528 10/18/19 0919 10/19/19 0651  NA 130* 130* 132* 130* 133*  K 4.5 4.0 3.6 3.4* 3.5  CL 91* 91* 92* 91* 92*  CO2 26 26 28 26 27   GLUCOSE 223* 224* 120* 136* 110*  BUN 27* 31* 32* 35* 36*  CREATININE 2.12* 2.02* 2.26* 2.21* 2.24*  CALCIUM 8.8* 8.5* 9.0 9.1 9.3  MG  --   --   --   --  2.5*  PHOS  --  4.3  --   --   --       CBC: Recent Labs  Lab 10/14/19 2338 10/15/19 1336 10/16/19 0629 10/18/19 0448 10/19/19 0651  WBC 10.9* 12.6* 12.3* 11.7* 10.9*  HGB 10.2* 10.0* 9.3* 8.7* 9.5*  HCT 30.6* 30.9* 27.8* 27.3* 29.8*  MCV 81.0 84.2 80.6 85.3 85.1  PLT 356 299 307 317 341      Lab Results  Component Value Date   HEPBSAG NON REACTIVE 10/17/2019   HEPBSAB NON REACTIVE 10/17/2019   HEPBIGM NON REACTIVE 10/17/2019      Microbiology:  Recent Results (from the past 240 hour(s))  SARS CORONAVIRUS 2 (TAT 6-24 HRS) Nasopharyngeal Nasopharyngeal Swab     Status: None   Collection Time: 10/15/19  1:35 AM   Specimen: Nasopharyngeal Swab  Result Value Ref Range Status   SARS Coronavirus 2 NEGATIVE NEGATIVE Final    Comment: (NOTE) SARS-CoV-2 target nucleic acids are NOT DETECTED. The SARS-CoV-2 RNA is generally detectable in upper and lower respiratory specimens during the acute phase of infection. Negative results do not preclude SARS-CoV-2 infection, do not rule out co-infections with other pathogens, and should not be used as the sole basis for treatment or other patient management decisions. Negative results must be combined with clinical observations, patient history, and  epidemiological information. The expected result is Negative. Fact Sheet for Patients: SugarRoll.be Fact Sheet for Healthcare Providers: https://www.woods-mathews.com/ This test is not yet approved or cleared by the [place] FDA and  has been authorized for detection and/or diagnosis of SARS-CoV-2 by FDA under an Emergency Use Authorization (EUA). This EUA will remain  in effect (meaning this test can be used) for the duration of the COVID-19 declaration under Section 56 4(b)(1) of the Act, 21 U.S.C. section 360bbb-3(b)(1), unless the authorization is terminated or revoked sooner. Performed at [facility], [place] [address], [place], [place] [address]      Coagulation Studies: No results for input(s): LABPROT, INR in the last 72 hours.  Urinalysis: No results for input(s): COLORURINE, LABSPEC, PHURINE, GLUCOSEU, HGBUR, BILIRUBINUR, KETONESUR, PROTEINUR, UROBILINOGEN, NITRITE, LEUKOCYTESUR in the last 72 hours.  Invalid input(s): APPERANCEUR    Imaging: No results found.   Medications:   . sodium chloride     . apixaban  5 mg Oral BID  . aspirin EC  81 mg Oral Daily  . atorvastatin  80 mg Oral q1800  . carvedilol  25 mg Oral BID WC  . famotidine  20 mg Oral Daily  . finasteride  5 mg Oral Daily  . insulin aspart  0-15 Units Subcutaneous TID WC  . insulin detemir  30 Units Subcutaneous Daily  . potassium chloride  20 mEq Oral Daily  . sodium chloride flush  3 mL Intravenous Q12H  . torsemide  40 mg Oral Daily   sodium chloride, acetaminophen, nitroGLYCERIN, ondansetron (ZOFRAN) IV, sodium chloride flush  Assessment/ Plan:  85 y.o.African American male  With grade 1 diastolic dysfunction (echo 09/2019), Moderate pulm HTN, Diabetes type 2 (and insulin dependent),  CAD, PVD, Gout, OSA, h/o DVT requiring chronic anticoagulation, h/o elevated PSA (followed by Dr Darcus Austin at [facility])  1. ARF on CKD st 3 A (Cr 1.4/GFR 53) on 07/09/2019 No recent iv contrast exposure No h/o excessive NSAIDs but was on voltaren gel as outpatient  Renal U.S neg for obstruction ? Over diuresis with lasix as outpatient SPEP shows small M Spike- WIll need surveillance as outpatient ([facility]) - renal function stable on oral torsemide  2. LE edema Fluid 3rd spacing vs lymphedema. Patient's daughter reports that he has lymphedema pumps at home Undergoing careful diuresis Diuretic changed to oral torsemide  3. Elevated PSA - followed by oncologist at [facility] - biopsy was recommended in March  4. Diabetes type 2 with CKD - insulin dependent - goal A1c 6.5-8% Lab Results  Component Value Date   HGBA1C 8.1 (H) 10/15/2019    5. Hypokalemia - likely due to  diuresis - may supplement orally as necessary    LOS: [place] 12/30/202010:22 AM  [place], [place]  Note: This note was prepared with Dragon dictation. Any transcription errors are unintentional

## 2019-10-19 NOTE — Progress Notes (Signed)
PROGRESS NOTE  [name] PQZ:[id number] DOB: 1932/06/29 DOA: 10/14/2019 PCP: [name], PA-C  A & P  Acute on chronic diastolic CHF exacerbation --Continues to improve.  Continue torsemide per nephrology.  Continue carvedilol.  Acute kidney injury superimposed on CKD stage III --Continue torsemide per nephrology.  Fall, history of frequent falls --PT, OT rec SNF  CAD --Stable.  Continue aspirin Plavix  Diabetes mellitus type 2 on insulin --CBG stable  Chronic anticoagulation secondary to DVT --Creatinine stable at 2.24.  Elevated LFTs, hepatitis C antibody etected --Follow-up as an outpatient  OSA on CPAP  DVT prophylaxis: apixaban Code Status: Full Family Communication: none Disposition Plan: SNF tomorrow    Murray Hodgkins, MD  [facility] Direct contact: see [email or web address] (further directions at bottom of note if needed) 7PM-7AM contact night coverage as at bottom of note 10/19/2019, 3:47 PM  LOS: 4 days   Significant Hospital Events   .    Consults:  .    Procedures:  .   Significant Diagnostic Tests:  Marland Kitchen    Micro Data:  .    Antimicrobials:  .   Interval History/Subjective  Feels okay, no complaints.  Objective   Vitals:  Vitals:   10/19/19 0539 10/19/19 0742  BP: 131/78 (!) 153/75  Pulse: 63 (!) 55  Resp: 20 18  Temp: 98.3 F (36.8 C) 98.2 F (36.8 C)  SpO2: 98% 99%    Exam:  Constitutional.  Appears calm, comfortable. Respiratory.  Clear to auscultation bilaterally.  No wheezes, rales or rhonchi.  Normal respiratory effort. Cardiovascular.  Regular rate and rhythm.  No murmur, rub or gallop.  Bilateral lower extremity lymphedema. Psychiatric.  Grossly normal mood and affect.  Speech fluent and appropriate.  I have personally reviewed the following:   Today's Data  . CBG stable . Creatinine stable at 3.24, remainder BMP unremarkable.  Magnesium within normal limits. . Hemoglobin stable at 9.5.  Scheduled  Meds: . apixaban  5 mg Oral BID  . aspirin EC  81 mg Oral Daily  . atorvastatin  80 mg Oral q1800  . carvedilol  25 mg Oral BID WC  . famotidine  20 mg Oral Daily  . finasteride  5 mg Oral Daily  . insulin aspart  0-15 Units Subcutaneous TID WC  . insulin detemir  30 Units Subcutaneous Daily  . potassium chloride  20 mEq Oral Daily  . sodium chloride flush  3 mL Intravenous Q12H  . torsemide  40 mg Oral Daily   Continuous Infusions: . sodium chloride      Principal Problem:   Acute exacerbation of CHF (congestive heart failure) (HCC) Active Problems:   Anasarca   CAD (coronary artery disease)   Insulin dependent type 2 diabetes mellitus (HCC)   Essential hypertension   Chronic anticoagulation   Acute kidney injury superimposed on CKD (HCC)   CKD (chronic kidney disease) stage 3, GFR 30-59 ml/min   Generalized weakness   Frequent falls   Elevated liver enzymes   Acute on chronic diastolic CHF (congestive heart failure) (HCC)   Elevated LFTs   LOS: 4 days   How to contact the [facility] Attending or Consulting provider [place] or covering provider during after hours 7P -7A, for this patient?  1. Check the care team in [facility] and look for a) attending/consulting TR[facility] provider listed and b) the [facility] team listed 2. Log into [email or web address] and use [place]'s universal password to access. If you do not have the password,  please contact the hospital operator. 3. Locate the [facility] provider you are looking for under [facility] and page to a number that you can be directly reached. 4. If you still have difficulty reaching the provider, please page the [facility] (Director on Call) for the Hospitalists listed on amion for assistance.

## 2019-10-19 NOTE — Progress Notes (Signed)
[facility] Encounter Note  Patient: [name] / Admit Date: 10/14/2019 / Date of Encounter: 10/19/2019, 8:33 AM   Subjective: Patient is slightly better today than yesterday and stable.  Feeling better and hemodynamically with good pulse and heart rate and blood pressure.  Patient has lost some weight with diuresis although continuing to have difficulty with primary cause including anemia chronic kidney disease and diastolic dysfunction heart failure.  This appears to be relatively stable today echocardiogram showing normal LV systolic function with ejection fraction 65% and no evidence of significant valvular heart disease.  Telemetry showing normal sinus rhythm.  Review of Systems: Positive for: Shortness of breath and edema Negative for: Vision change, hearing change, syncope, dizziness, nausea, vomiting,diarrhea, bloody stool, stomach pain, cough, congestion, diaphoresis, urinary frequency, urinary pain,skin lesions, skin rashes Others previously listed  Objective: Telemetry: Normal sinus rhythm Physical Exam: Blood pressure (!) 153/75, pulse (!) 55, temperature 98.2 F (36.8 C), resp. rate 18, height 5\' 11"  (1.803 m), weight 122.6 kg, SpO2 99 %. Body mass index is 37.69 kg/m. General: Well developed, well nourished, in no acute distress. Head: Normocephalic, atraumatic, sclera non-icteric, no xanthomas, nares are without discharge. Neck: No apparent masses Lungs: Normal respirations with no wheezes, some rhonchi, no rales , few crackles   Heart: Regular rate and rhythm, normal S1 S2, no murmur, no rub, no gallop, PMI is normal size and placement, carotid upstroke normal without bruit, jugular venous pressure normal Abdomen: Soft, non-tender,  distended with normoactive bowel sounds. No hepatosplenomegaly. Abdominal aorta is normal size without bruit Extremities: 1+ edema, no clubbing, no cyanosis, no ulcers,  Peripheral: 2+ radial, 2+ femoral, 2+ dorsal  pedal pulses Neuro: Alert and oriented. Moves all extremities spontaneously. Psych:  Responds to questions appropriately with a normal affect.   Intake/Output Summary (Last 24 hours) at 10/19/2019 0833 Last data filed at 10/19/2019 0636 Gross per 24 hour  Intake 210.72 ml  Output 1150 ml  Net -939.28 ml    Inpatient Medications:  . apixaban  5 mg Oral BID  . aspirin EC  81 mg Oral Daily  . atorvastatin  80 mg Oral q1800  . carvedilol  25 mg Oral BID WC  . famotidine  20 mg Oral Daily  . finasteride  5 mg Oral Daily  . insulin aspart  0-15 Units Subcutaneous TID WC  . insulin detemir  30 Units Subcutaneous Daily  . potassium chloride  20 mEq Oral Daily  . sodium chloride flush  3 mL Intravenous Q12H  . torsemide  40 mg Oral Daily   Infusions:  . sodium chloride      Labs: Recent Labs    10/18/19 0919 10/19/19 0651  NA 130* 133*  K 3.4* 3.5  CL 91* 92*  CO2 26 27  GLUCOSE 136* 110*  BUN 35* 36*  CREATININE 2.21* 2.24*  CALCIUM 9.1 9.3  MG  --  2.5*   Recent Labs    10/17/19 0528  AST 43*  ALT 71*  ALKPHOS 146*  BILITOT 1.2  PROT 6.9  ALBUMIN 3.2*   Recent Labs    10/18/19 0448 10/19/19 0651  WBC 11.7* 10.9*  HGB 8.7* 9.5*  HCT 27.3* 29.8*  MCV 85.3 85.1  PLT 317 341   No results for input(s): CKTOTAL, CKMB, TROPONINI in the last 72 hours. Invalid input(s): POCBNP No results for input(s): HGBA1C in the last 72 hours.   Weights: Filed Weights   10/16/19 0410 10/17/19 0453 10/19/19 0539  Weight:  129.5 kg 126.7 kg 122.6 kg     Radiology/Studies:  CT HEAD WO CONTRAST  Result Date: 10/15/2019 CLINICAL DATA:  Head trauma EXAM: CT HEAD WITHOUT CONTRAST TECHNIQUE: Contiguous axial images were obtained from the base of the skull through the vertex without intravenous contrast. COMPARISON:  None. FINDINGS: Brain: No evidence of acute territorial infarction, hemorrhage, hydrocephalus,extra-axial collection or mass lesion/mass effect. There is  dilatation the ventricles and sulci consistent with age-related atrophy. Low-attenuation changes in the deep white matter consistent with small vessel ischemia. Vascular: No hyperdense vessel or unexpected calcification. Skull: The skull is intact. No fracture or focal lesion identified. Sinuses/Orbits: The visualized paranasal sinuses and mastoid air cells are clear. The orbits and globes intact. Other: None IMPRESSION: No acute intracranial abnormality. Findings consistent with age related atrophy and chronic small vessel ischemia Electronically Signed   By: Prudencio Pair M.D.   On: 10/15/2019 03:49   US RENAL  Result Date: 10/16/2019 CLINICAL DATA:  Acute renal failure, chronic kidney disease stage 3. EXAM: RENAL / URINARY TRACT ULTRASOUND COMPLETE COMPARISON:  None. FINDINGS: Right Kidney: Renal measurements: 10.1 x 6.3 x 6.4 cm = volume: 215 mL. Mild increased cortical echogenicity. No mass or hydronephrosis visualized. 6.3 cm cyst over the mid pole. Left Kidney: Renal measurements: 9.5 x 6.1 x 6.3 cm = volume: 193 mL. Mild increased cortical echogenicity. No mass or hydronephrosis visualized. Bladder: Appears normal for degree of bladder distention. Other: None. IMPRESSION: 1. Normal size kidneys without hydronephrosis. Mild increased cortical echogenicity which can be seen with medical renal disease. 2.  6.3 cm right renal cyst. Electronically Signed   By: [name] M.D.   On: 10/16/2019 09:17   DG Chest Portable 1 View  Result Date: 10/15/2019 CLINICAL DATA:  Shortness of breath, wheezing, positive CHF EXAM: PORTABLE CHEST 1 VIEW COMPARISON:  Radiograph 12/18/2018 FINDINGS: Hazy interstitial opacities more pronounced towards the lung bases with some bandlike areas of opacity likely reflecting superimposed subsegmental atelectasis and/or scarring. Mild cephalization of the pulmonary vascularity with some fissural thickening. Cardiomegaly is similar to comparison accounting for differences in  technique. No visible effusion or pneumothorax. No acute osseous or soft tissue abnormality. Telemetry leads overlie the chest. IMPRESSION: Features compatible with CHF including cardiomegaly and mild interstitial edema. More bandlike opacities likely reflect subsegmental atelectasis or scarring. Electronically Signed   By: Lovena Le M.D.   On: 10/15/2019 00:06   ECHOCARDIOGRAM COMPLETE  Result Date: 10/15/2019   ECHOCARDIOGRAM REPORT   Patient Name:   DIN BOOKWALTER Date of Exam: 10/15/2019 Medical Rec #:  [id number]       Height:       71.0 in Accession #:    [id number]      Weight:       275.0 lb Date of Birth:  1932/02/28       BSA:          2.41 m Patient Age:    12 years        BP:           140/60 mmHg Patient Gender: M               HR:           67 bpm. Exam Location:  [facility] Procedure: 2D Echo, Color Doppler and Cardiac Doppler Indications:     I50.9 Congestive Heart Failure  History:         Patient has no prior history of Echocardiogram examinations.  CAD and Previous Myocardial Infarction, PVD and COPD; Risk                  Factors:Diabetes, Hypertension and Dyslipidemia.  Sonographer:     Charmayne Sheer RDCS (AE) Referring Phys:  [id number] Athena Masse Diagnosing Phys: Ida Rogue MD  Sonographer Comments: Suboptimal parasternal window and no subcostal window. Pt moving throughout study due to SOB and Image acquisition challenging due to COPD. IMPRESSIONS  1. Left ventricular ejection fraction, by visual estimation, is 60 to 65%. The left ventricle has normal function. There is no left ventricular hypertrophy.  2. Left ventricular diastolic parameters are consistent with Grade I diastolic dysfunction (impaired relaxation).  3. The left ventricle has no regional wall motion abnormalities.  4. Global right ventricle has normal systolic function.The right ventricular size is normal. No increase in right ventricular wall thickness.  5. Left atrial size was mildly dilated.  6.  Moderately elevated pulmonary artery systolic pressure.  7. The tricuspid regurgitant velocity is 3.12 m/s, and with an assumed right atrial pressure of 10 mmHg, the estimated right ventricular systolic pressure is moderately elevated at 48.9 mmHg. FINDINGS  Left Ventricle: Left ventricular ejection fraction, by visual estimation, is 60 to 65%. The left ventricle has normal function. The left ventricle has no regional wall motion abnormalities. There is no left ventricular hypertrophy. Left ventricular diastolic parameters are consistent with Grade I diastolic dysfunction (impaired relaxation). Normal left atrial pressure. Right Ventricle: The right ventricular size is normal. No increase in right ventricular wall thickness. Global RV systolic function is has normal systolic function. The tricuspid regurgitant velocity is 3.12 m/s, and with an assumed right atrial pressure  of 10 mmHg, the estimated right ventricular systolic pressure is moderately elevated at 48.9 mmHg. Left Atrium: Left atrial size was mildly dilated. Right Atrium: Right atrial size was normal in size Pericardium: There is no evidence of pericardial effusion. Mitral Valve: The mitral valve is normal in structure. Mild mitral valve regurgitation. No evidence of mitral valve stenosis by observation. MV peak gradient, 4.6 mmHg. Tricuspid Valve: The tricuspid valve is normal in structure. Tricuspid valve regurgitation is not demonstrated. Aortic Valve: The aortic valve was not well visualized. Aortic valve regurgitation is not visualized. Mild aortic valve sclerosis is present, with no evidence of aortic valve stenosis. Aortic valve mean gradient measures 7.0 mmHg. Aortic valve peak gradient measures 11.6 mmHg. Aortic valve area, by VTI measures 3.20 cm. Pulmonic Valve: The pulmonic valve was normal in structure. Pulmonic valve regurgitation is not visualized. Pulmonic regurgitation is not visualized. Aorta: The aortic root, ascending aorta and  aortic arch are all structurally normal, with no evidence of dilitation or obstruction. Venous: The inferior vena cava is normal in size with greater than 50% respiratory variability, suggesting right atrial pressure of 3 mmHg. IAS/Shunts: No atrial level shunt detected by color flow Doppler. There is no evidence of a patent foramen ovale. No ventricular septal defect is seen or detected. There is no evidence of an atrial septal defect.  LEFT VENTRICLE PLAX 2D LVIDd:         4.58 cm  Diastology LVIDs:         3.28 cm  LV e' lateral:   9.36 cm/s LV PW:         1.15 cm  LV E/e' lateral: 9.2 LV IVS:        0.82 cm  LV e' medial:    6.64 cm/s LVOT diam:     2.40  cm  LV E/e' medial:  13.0 LV SV:         53 ml LV SV Index:   20.73 LVOT Area:     4.52 cm  RIGHT VENTRICLE RV Basal diam:  3.04 cm LEFT ATRIUM             Index       RIGHT ATRIUM           Index LA diam:        4.40 cm 1.82 cm/m  RA Area:     12.60 cm LA Vol (A2C):   62.9 ml 26.05 ml/m RA Volume:   27.60 ml  11.43 ml/m LA Vol (A4C):   52.2 ml 21.62 ml/m LA Biplane Vol: 57.9 ml 23.98 ml/m  AORTIC VALVE                    PULMONIC VALVE AV Area (Vmax):    3.41 cm     PV Vmax:       1.04 m/s AV Area (Vmean):   3.30 cm     PV Vmean:      71.000 cm/s AV Area (VTI):     3.20 cm     PV VTI:        0.264 m AV Vmax:           170.00 cm/s  PV Peak grad:  4.3 mmHg AV Vmean:          125.000 cm/s PV Mean grad:  2.0 mmHg AV VTI:            0.401 m AV Peak Grad:      11.6 mmHg AV Mean Grad:      7.0 mmHg LVOT Vmax:         128.00 cm/s LVOT Vmean:        91.100 cm/s LVOT VTI:          0.284 m LVOT/AV VTI ratio: 0.71  AORTA Ao Root diam: 3.30 cm MITRAL VALVE                        TRICUSPID VALVE MV Area (PHT): 3.53 cm             TR Peak grad:   38.9 mmHg MV Peak grad:  4.6 mmHg             TR Vmax:        312.00 cm/s MV Mean grad:  2.0 mmHg MV Vmax:       1.07 m/s             SHUNTS MV Vmean:      68.9 cm/s            Systemic VTI:  0.28 m MV VTI:        0.38 m                Systemic Diam: 2.40 cm MV PHT:        62.35 msec MV Decel Time: 215 msec MV E velocity: 86.30 cm/s 103 cm/s MV A velocity: 94.10 cm/s 70.3 cm/s MV E/A ratio:  0.92       1.5  Ida Rogue MD Electronically signed by Ida Rogue MD Signature Date/Time: 10/15/2019/3:57:33 PM    Final      Assessment and Recommendation  85 y.o. male with acute on chronic diastolic dysfunction congestive heart failure multifactorial in nature including anemia hypertension diabetes and acute on chronic  chronic kidney disease slightly improved with diuresis without evidence of myocardial infarction 1.  Continue diuresis with change to torsemide as per nephrology is okay watching closely for worsening chronic kidney disease exacerbation 2.  Continue carvedilol for cardiomyopathy and congestive heart failure as well as hypertension control without change 3.  Eliquis for further risk reduction of deep venous thrombosis although may need to discontinue at this time due to anemia if patient is bleeding or having significant anemia due to this cause 4.  No further cardiac intervention or diagnostics necessary at this time 5.  Begin ambulation and follow for improvements of symptoms need for further adjustments as above and if maximize medical management for this hospitalization would be okay for discharged home with follow-up in 1 to 2 weeks from the cardiac standpoint for further adjustments of medication management  Signed, Serafina Royals M.D. FACC

## 2019-10-20 ENCOUNTER — Encounter: Payer: Self-pay | Admitting: Internal Medicine

## 2019-10-20 DIAGNOSIS — R768 Other specified abnormal immunological findings in serum: Secondary | ICD-10-CM

## 2019-10-20 HISTORY — DX: Other specified abnormal immunological findings in serum: R76.8

## 2019-10-20 LAB — RENAL FUNCTION PANEL
Albumin: 3.8 g/dL (ref 3.5–5.0)
Anion gap: 12 (ref 5–15)
BUN: 38 mg/dL — ABNORMAL HIGH (ref 8–23)
CO2: 27 mmol/L (ref 22–32)
Calcium: 9.5 mg/dL (ref 8.9–10.3)
Chloride: 92 mmol/L — ABNORMAL LOW (ref 98–111)
Creatinine, Ser: 2.05 mg/dL — ABNORMAL HIGH (ref 0.61–1.24)
GFR calc Af Amer: 33 mL/min — ABNORMAL LOW (ref >60)
GFR calc non Af Amer: 29 mL/min — ABNORMAL LOW (ref >60)
Glucose, Bld: 184 mg/dL — ABNORMAL HIGH (ref 70–99)
Phosphorus: 3.2 mg/dL (ref 2.5–4.6)
Potassium: 4 mmol/L (ref 3.5–5.1)
Sodium: 131 mmol/L — ABNORMAL LOW (ref 135–145)

## 2019-10-20 LAB — URINALYSIS, ROUTINE W REFLEX MICROSCOPIC
Bilirubin Urine: NEGATIVE
Glucose, UA: NEGATIVE mg/dL
Ketones, ur: NEGATIVE mg/dL
Nitrite: NEGATIVE
Protein, ur: NEGATIVE mg/dL
Specific Gravity, Urine: 1.011 (ref 1.005–1.030)
pH: 5 (ref 5.0–8.0)

## 2019-10-20 LAB — GLUCOSE, CAPILLARY
Glucose-Capillary: 170 mg/dL — ABNORMAL HIGH (ref 70–99)
Glucose-Capillary: 169 mg/dL — ABNORMAL HIGH (ref 70–99)

## 2019-10-20 LAB — PROTEIN / CREATININE RATIO, URINE
Creatinine, Urine: 97 mg/dL
Protein Creatinine Ratio: 0.08 mg/mg{Cre} (ref 0.00–0.15)
Total Protein, Urine: 8 mg/dL

## 2019-10-20 LAB — SARS CORONAVIRUS 2 (TAT 6-24 HRS): SARS Coronavirus 2: NEGATIVE

## 2019-10-20 MED ORDER — INSULIN DETEMIR 100 UNIT/ML FLEXPEN: 30.0000 [IU] | PEN_INJECTOR | Freq: Two times a day (BID) | SUBCUTANEOUS | Status: DC

## 2019-10-20 MED ORDER — TORSEMIDE 20 MG PO TABS: 40.0000 mg | ORAL_TABLET | Freq: Every day | ORAL | Status: DC

## 2019-10-20 NOTE — Progress Notes (Signed)
Patient being discharged to [facility]. Spoke to nurse receiving patient and gave report. Called EMS for transport.

## 2019-10-20 NOTE — TOC Transition Note (Signed)
Transition of Care [facility]) - CM/SW Discharge Note   Patient Details  Name: [name] MRN: [id number] Date of Birth: 11/23/1931  [facility] CM/SW Contact:  Ross Ludwig, LCSW Phone Number: 10/20/2019, 4:01 PM   Clinical Narrative:     Patient to be d/c'ed today to [facility] room 11B.  Patient and family agreeable to plans will transport via ems RN to call report to [phone].  CSW spoke to patient's daughter [name] and informed her that patient is discharging today.    Final next level of care: Skilled Nursing Facility Barriers to Discharge: Barriers Resolved   Patient Goals and CMS Choice Patient states their goals for this hospitalization and ongoing recovery are:: To go to SNF for short term rehab, then return back home. CMS Medicare.gov Compare Post Acute Care list provided to:: Patient Represenative (must comment) Choice offered to / list presented to : Patient  Discharge Placement   Existing PASRR number confirmed : 10/18/19          Patient chooses bed at: [facility] Patient to be transferred to facility by: [facility] Name of family member notified: [name], [name] Daughter   [phone] Patient and family notified of of transfer: 10/20/19  Discharge Plan and Services In-house Referral: Clinical Social Work   Post Acute Care Choice: [place]                               Social Determinants of Health (SDOH) Interventions     Readmission Risk Interventions No flowsheet data found.

## 2019-10-20 NOTE — Progress Notes (Signed)
[facility] Encounter Note  Patient: [name] / Admit Date: 10/14/2019 / Date of Encounter: 10/20/2019, 8:34 AM   Subjective: Still having slight daily improvements since admission from edema and shortness of breath standpoint.  Feeling better and hemodynamically with good pulse and heart rate and blood pressure.  Patient has lost some weight with diuresis although continuing to have difficulty with primary cause including anemia chronic kidney disease and diastolic dysfunction heart failure.    echocardiogram showing normal LV systolic function with ejection fraction 65% and no evidence of significant valvular heart disease.  Telemetry showing normal sinus rhythm.  Review of Systems: Positive for: Shortness of breath and edema Negative for: Vision change, hearing change, syncope, dizziness, nausea, vomiting,diarrhea, bloody stool, stomach pain, cough, congestion, diaphoresis, urinary frequency, urinary pain,skin lesions, skin rashes Others previously listed  Objective: Telemetry: Normal sinus rhythm Physical Exam: Blood pressure (!) 140/55, pulse (!) 56, temperature 97.9 F (36.6 C), temperature source Oral, resp. rate 18, height 5\' 11"  (1.803 m), weight 122.6 kg, SpO2 99 %. Body mass index is 37.69 kg/m. General: Well developed, well nourished, in no acute distress. Head: Normocephalic, atraumatic, sclera non-icteric, no xanthomas, nares are without discharge. Neck: No apparent masses Lungs: Normal respirations with no wheezes, some rhonchi, no rales , few crackles   Heart: Regular rate and rhythm, normal S1 S2, no murmur, no rub, no gallop, PMI is normal size and placement, carotid upstroke normal without bruit, jugular venous pressure normal Abdomen: Soft, non-tender,  distended with normoactive bowel sounds. No hepatosplenomegaly. Abdominal aorta is normal size without bruit Extremities: 1+ edema, no clubbing, no cyanosis, no ulcers,  Peripheral: 2+  radial, 2+ femoral, 2+ dorsal pedal pulses Neuro: Alert and oriented. Moves all extremities spontaneously. Psych:  Responds to questions appropriately with a normal affect.   Intake/Output Summary (Last 24 hours) at 10/20/2019 0834 Last data filed at 10/19/2019 2100 Gross per 24 hour  Intake 240 ml  Output 950 ml  Net -710 ml    Inpatient Medications:  . apixaban  5 mg Oral BID  . aspirin EC  81 mg Oral Daily  . atorvastatin  80 mg Oral q1800  . carvedilol  25 mg Oral BID WC  . famotidine  20 mg Oral Daily  . finasteride  5 mg Oral Daily  . insulin aspart  0-15 Units Subcutaneous TID WC  . insulin detemir  30 Units Subcutaneous Daily  . sodium chloride flush  3 mL Intravenous Q12H  . torsemide  40 mg Oral Daily   Infusions:  . sodium chloride      Labs: Recent Labs    10/18/19 0919 10/19/19 0651  NA 130* 133*  K 3.4* 3.5  CL 91* 92*  CO2 26 27  GLUCOSE 136* 110*  BUN 35* 36*  CREATININE 2.21* 2.24*  CALCIUM 9.1 9.3  MG  --  2.5*   No results for input(s): AST, ALT, ALKPHOS, BILITOT, PROT, ALBUMIN in the last 72 hours. Recent Labs    10/18/19 0448 10/19/19 0651  WBC 11.7* 10.9*  HGB 8.7* 9.5*  HCT 27.3* 29.8*  MCV 85.3 85.1  PLT 317 341   No results for input(s): CKTOTAL, CKMB, TROPONINI in the last 72 hours. Invalid input(s): POCBNP No results for input(s): HGBA1C in the last 72 hours.   Weights: Filed Weights   10/16/19 0410 10/17/19 0453 10/19/19 0539  Weight: 129.5 kg 126.7 kg 122.6 kg     Radiology/Studies:  CT HEAD WO CONTRAST  Result  Date: 10/15/2019 CLINICAL DATA:  Head trauma EXAM: CT HEAD WITHOUT CONTRAST TECHNIQUE: Contiguous axial images were obtained from the base of the skull through the vertex without intravenous contrast. COMPARISON:  None. FINDINGS: Brain: No evidence of acute territorial infarction, hemorrhage, hydrocephalus,extra-axial collection or mass lesion/mass effect. There is dilatation the ventricles and sulci consistent  with age-related atrophy. Low-attenuation changes in the deep white matter consistent with small vessel ischemia. Vascular: No hyperdense vessel or unexpected calcification. Skull: The skull is intact. No fracture or focal lesion identified. Sinuses/Orbits: The visualized paranasal sinuses and mastoid air cells are clear. The orbits and globes intact. Other: None IMPRESSION: No acute intracranial abnormality. Findings consistent with age related atrophy and chronic small vessel ischemia Electronically Signed   By: Prudencio Pair M.D.   On: 10/15/2019 03:49   US RENAL  Result Date: 10/16/2019 CLINICAL DATA:  Acute renal failure, chronic kidney disease stage 3. EXAM: RENAL / URINARY TRACT ULTRASOUND COMPLETE COMPARISON:  None. FINDINGS: Right Kidney: Renal measurements: 10.1 x 6.3 x 6.4 cm = volume: 215 mL. Mild increased cortical echogenicity. No mass or hydronephrosis visualized. 6.3 cm cyst over the mid pole. Left Kidney: Renal measurements: 9.5 x 6.1 x 6.3 cm = volume: 193 mL. Mild increased cortical echogenicity. No mass or hydronephrosis visualized. Bladder: Appears normal for degree of bladder distention. Other: None. IMPRESSION: 1. Normal size kidneys without hydronephrosis. Mild increased cortical echogenicity which can be seen with medical renal disease. 2.  6.3 cm right renal cyst. Electronically Signed   By: [name] M.D.   On: 10/16/2019 09:17   DG Chest Portable 1 View  Result Date: 10/15/2019 CLINICAL DATA:  Shortness of breath, wheezing, positive CHF EXAM: PORTABLE CHEST 1 VIEW COMPARISON:  Radiograph 12/18/2018 FINDINGS: Hazy interstitial opacities more pronounced towards the lung bases with some bandlike areas of opacity likely reflecting superimposed subsegmental atelectasis and/or scarring. Mild cephalization of the pulmonary vascularity with some fissural thickening. Cardiomegaly is similar to comparison accounting for differences in technique. No visible effusion or pneumothorax. No  acute osseous or soft tissue abnormality. Telemetry leads overlie the chest. IMPRESSION: Features compatible with CHF including cardiomegaly and mild interstitial edema. More bandlike opacities likely reflect subsegmental atelectasis or scarring. Electronically Signed   By: Lovena Le M.D.   On: 10/15/2019 00:06   ECHOCARDIOGRAM COMPLETE  Result Date: 10/15/2019   ECHOCARDIOGRAM REPORT   Patient Name:   [name] Date of Exam: 10/15/2019 Medical Rec #:  [id number]       Height:       71.0 in Accession #:    [id number]      Weight:       275.0 lb Date of Birth:  14-Aug-1932       BSA:          2.41 m Patient Age:    85 years        BP:           140/60 mmHg Patient Gender: M               HR:           67 bpm. Exam Location:  [facility] Procedure: 2D Echo, Color Doppler and Cardiac Doppler Indications:     I50.9 Congestive Heart Failure  History:         Patient has no prior history of Echocardiogram examinations.                  CAD and Previous Myocardial Infarction,  PVD and COPD; Risk                  Factors:Diabetes, Hypertension and Dyslipidemia.  Sonographer:     Charmayne Sheer RDCS (AE) Referring Phys:  [id number] Athena Masse Diagnosing Phys: Ida Rogue MD  Sonographer Comments: Suboptimal parasternal window and no subcostal window. Pt moving throughout study due to SOB and Image acquisition challenging due to COPD. IMPRESSIONS  1. Left ventricular ejection fraction, by visual estimation, is 60 to 65%. The left ventricle has normal function. There is no left ventricular hypertrophy.  2. Left ventricular diastolic parameters are consistent with Grade I diastolic dysfunction (impaired relaxation).  3. The left ventricle has no regional wall motion abnormalities.  4. Global right ventricle has normal systolic function.The right ventricular size is normal. No increase in right ventricular wall thickness.  5. Left atrial size was mildly dilated.  6. Moderately elevated pulmonary artery systolic pressure.  7.  The tricuspid regurgitant velocity is 3.12 m/s, and with an assumed right atrial pressure of 10 mmHg, the estimated right ventricular systolic pressure is moderately elevated at 48.9 mmHg. FINDINGS  Left Ventricle: Left ventricular ejection fraction, by visual estimation, is 60 to 65%. The left ventricle has normal function. The left ventricle has no regional wall motion abnormalities. There is no left ventricular hypertrophy. Left ventricular diastolic parameters are consistent with Grade I diastolic dysfunction (impaired relaxation). Normal left atrial pressure. Right Ventricle: The right ventricular size is normal. No increase in right ventricular wall thickness. Global RV systolic function is has normal systolic function. The tricuspid regurgitant velocity is 3.12 m/s, and with an assumed right atrial pressure  of 10 mmHg, the estimated right ventricular systolic pressure is moderately elevated at 48.9 mmHg. Left Atrium: Left atrial size was mildly dilated. Right Atrium: Right atrial size was normal in size Pericardium: There is no evidence of pericardial effusion. Mitral Valve: The mitral valve is normal in structure. Mild mitral valve regurgitation. No evidence of mitral valve stenosis by observation. MV peak gradient, 4.6 mmHg. Tricuspid Valve: The tricuspid valve is normal in structure. Tricuspid valve regurgitation is not demonstrated. Aortic Valve: The aortic valve was not well visualized. Aortic valve regurgitation is not visualized. Mild aortic valve sclerosis is present, with no evidence of aortic valve stenosis. Aortic valve mean gradient measures 7.0 mmHg. Aortic valve peak gradient measures 11.6 mmHg. Aortic valve area, by VTI measures 3.20 cm. Pulmonic Valve: The pulmonic valve was normal in structure. Pulmonic valve regurgitation is not visualized. Pulmonic regurgitation is not visualized. Aorta: The aortic root, ascending aorta and aortic arch are all structurally normal, with no evidence of  dilitation or obstruction. Venous: The inferior vena cava is normal in size with greater than 50% respiratory variability, suggesting right atrial pressure of 3 mmHg. IAS/Shunts: No atrial level shunt detected by color flow Doppler. There is no evidence of a patent foramen ovale. No ventricular septal defect is seen or detected. There is no evidence of an atrial septal defect.  LEFT VENTRICLE PLAX 2D LVIDd:         4.58 cm  Diastology LVIDs:         3.28 cm  LV e' lateral:   9.36 cm/s LV PW:         1.15 cm  LV E/e' lateral: 9.2 LV IVS:        0.82 cm  LV e' medial:    6.64 cm/s LVOT diam:     2.40 cm  LV E/e' medial:  13.0 LV SV:         53 ml LV SV Index:   20.73 LVOT Area:     4.52 cm  RIGHT VENTRICLE RV Basal diam:  3.04 cm LEFT ATRIUM             Index       RIGHT ATRIUM           Index LA diam:        4.40 cm 1.82 cm/m  RA Area:     12.60 cm LA Vol (A2C):   62.9 ml 26.05 ml/m RA Volume:   27.60 ml  11.43 ml/m LA Vol (A4C):   52.2 ml 21.62 ml/m LA Biplane Vol: 57.9 ml 23.98 ml/m  AORTIC VALVE                    PULMONIC VALVE AV Area (Vmax):    3.41 cm     PV Vmax:       1.04 m/s AV Area (Vmean):   3.30 cm     PV Vmean:      71.000 cm/s AV Area (VTI):     3.20 cm     PV VTI:        0.264 m AV Vmax:           170.00 cm/s  PV Peak grad:  4.3 mmHg AV Vmean:          125.000 cm/s PV Mean grad:  2.0 mmHg AV VTI:            0.401 m AV Peak Grad:      11.6 mmHg AV Mean Grad:      7.0 mmHg LVOT Vmax:         128.00 cm/s LVOT Vmean:        91.100 cm/s LVOT VTI:          0.284 m LVOT/AV VTI ratio: 0.71  AORTA Ao Root diam: 3.30 cm MITRAL VALVE                        TRICUSPID VALVE MV Area (PHT): 3.53 cm             TR Peak grad:   38.9 mmHg MV Peak grad:  4.6 mmHg             TR Vmax:        312.00 cm/s MV Mean grad:  2.0 mmHg MV Vmax:       1.07 m/s             SHUNTS MV Vmean:      68.9 cm/s            Systemic VTI:  0.28 m MV VTI:        0.38 m               Systemic Diam: 2.40 cm MV PHT:        62.35 msec  MV Decel Time: 215 msec MV E velocity: 86.30 cm/s 103 cm/s MV A velocity: 94.10 cm/s 70.3 cm/s MV E/A ratio:  0.92       1.5  Ida Rogue MD Electronically signed by Ida Rogue MD Signature Date/Time: 10/15/2019/3:57:33 PM    Final      Assessment and Recommendation  85 y.o. male with acute on chronic diastolic dysfunction congestive heart failure multifactorial in nature including anemia hypertension diabetes and acute on chronic chronic kidney disease slightly improved with  diuresis without evidence of myocardial infarction and appears to have had maximal hospital benefit from the cardiac standpoint 1.  Continue diuresis with change to torsemide as per nephrology is okay watching closely for worsening chronic kidney disease exacerbation and appears to be able to tolerate well 2.  Continue carvedilol for cardiomyopathy and congestive heart failure as well as hypertension control without change 3.  Eliquis for further risk reduction of deep venous thrombosis although may need to discontinue at this time due to anemia if patient is bleeding or having significant anemia due to this cause 4.  No further cardiac intervention or diagnostics necessary at this time 5.  Begin ambulation and follow for improvements of symptoms need for further adjustments as above and if maximize medical management for this hospitalization would be okay for discharged home with follow-up in 1 to 2 weeks from the cardiac standpoint for further adjustments of medication management 6.  Call if further questions Signed, Serafina Royals M.D. FACC

## 2019-10-20 NOTE — Discharge Summary (Signed)
Physician Discharge Summary  [name] SAY:[id number] DOB: 05-10-1932 DOA: 10/14/2019  PCP: [name], PA-C  Admit date: 10/14/2019 Discharge date: 10/20/2019  Recommendations for Outpatient Follow-up:   Acute on chronic diastolic CHF exacerbation --Appears to be stable at this point, -2.3 L since admission.  Continue torsemide per nephrology, continue carvedilol.  --Follow-up with cardiology in 1 to 2 weeks  Acute kidney injury superimposed on CKD stage IIIa with proteinuria and hematuria.  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  Acute kidney injury secondary to cardiorenal syndrome.  Renal ultrasound no obstruction.  Not on ACE inhibitor or ARB. --Stable overall.  Continue torsemide per nephrology.  Follow-up creatinine in 1 week.   Elevated LFTs, hepatitis C antibody detected. --Follow-up as an outpatient for consideration of viral load testing and treatment of positive  OSA on CPAP --Continue CPAP   Contact information for follow-up providers    [facility] Follow up on 10/25/2019.   Specialty: Cardiology Why: at 11:30am. Enter through the [facility] entrance Contact information: [place] [phone]           Contact information for after-discharge care    [facility] .   Service: Skilled Nursing Contact information: [place] [phone]                   Discharge Diagnoses: Principal diagnosis is #1 1. Acute on chronic diastolic CHF exacerbation 2. Acute kidney injury superimposed on CKD stage IIIa with proteinuria and hematuria 3. Fall, history of frequent falls 4. CAD 5. Diabetes mellitus type 2 on insulin with CKD stage IIIa, diabetic nephropathy.  Hemoglobin A1c 8.1. 6. 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 Anemia of CKD,  stable. 8. Hyponatremia secondary to CHF. 9. Elevated transaminases, hepatitis C antibody detected. 10. OSA on CPAP  Discharge Condition: imprved Disposition: SNF short term  Diet recommendation: 2 g sodium diet  Filed Weights   10/16/19 0410 10/17/19 0453 10/19/19 0539  Weight: 129.5 kg 126.7 kg 122.6 kg    History of present illness:  85 year old man lives with daughter, ambulates with cane, complex past medical history, presented because of generalized weakness, multiple falls, admitted for acute CHF.   Hospital Course:   He was treated with diuresis and seen by cardiology and nephrology.  Eventually his condition improved and he was transitioned to oral diuretics.  He was seen by therapy with recommendation for skilled rehab.  Acute on chronic diastolic CHF exacerbation --Appears to be stable at this point, -2.3 L since admission.  Continue torsemide per nephrology, continue carvedilol.  --Follow-up with cardiology in 1 to 2 weeks  Acute kidney injury superimposed on CKD stage IIIa with proteinuria and hematuria.  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  Acute kidney injury secondary to cardiorenal syndrome.  Renal ultrasound no obstruction.  Not on ACE inhibitor or ARB. --Stable overall.  Continue torsemide per nephrology.  Follow-up creatinine in 1 week.  Fall, history of frequent falls --Probably secondary to deconditioning.  CT head negative.  PT, OT rec SNF  CAD --Remains stable.  Continue aspirin Plavix  Diabetes mellitus type 2 on insulin with CKD stage IIIa, diabetic nephropathy.  Hemoglobin A1c 8.1. 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  Anemia of CKD, stable. --Follow-up as an outpatient with nephrology.  Hyponatremia secondary to CHF. --Stable, asymptomatic  Elevated LFTs,  hepatitis C antibody detected. --Follow-up as an outpatient for consideration of viral load testing and treatment of  positive  OSA on CPAP --Continue CPAP  Lymphedema chronic, chronic venous stasis  Significant Hospital Events    12/26 admitted for acute CHF  Consults:   12/27 nephrology consultation  12/28 cardiology consultation  Procedures:     Significant Diagnostic Tests:   12/26 echocardiogram LVEF 60 to 65%.  Normal wall motion.  Grade 1 diastolic dysfunction.  Micro Data:   SARS-CoV-2 negative  Today's assessment: S: no new issues O: Vitals:  Vitals:   10/20/19 0713 10/20/19 0714  BP: 127/63 (!) 140/55  Pulse: (!) 56 (!) 56  Resp: 18   Temp: 97.9 F (36.6 C)   SpO2: 100% 99%    Constitutional:  . Appears calm and comfortable sitting in chair. Respiratory:  . CTA bilaterally, no w/r/r.  . Respiratory effort normal.  Cardiovascular:  . RRR, no m/r/g . No change in lymphedema lower extremities Psychiatric:  . Mental status o Mood, affect appropriate  Urine output 950, -2.3 L CBG stable No new data other than repeat SARS-CoV-2 negative  Discharge Instructions   Allergies as of 10/20/2019      Reactions   Metformin And Related Diarrhea   Intolerance due to naturally loose bowels      Medication List    STOP taking these medications   amLODipine 10 MG tablet Commonly known as: NORVASC   diclofenac Sodium 1 % Gel Commonly known as: VOLTAREN   furosemide 40 MG tablet Commonly known as: LASIX   gabapentin 300 MG capsule Commonly known as: NEURONTIN     TAKE these medications   aspirin 81 MG chewable tablet Chew by mouth.   atorvastatin 80 MG tablet Commonly known as: LIPITOR Take 80 mg by mouth daily.   carvedilol 25 MG tablet Commonly known as: COREG Take 25 mg by mouth 2 (two) times daily with a meal.   clopidogrel 75 MG tablet Commonly known as: PLAVIX Take 75 mg by mouth daily.   DULoxetine 60 MG capsule Commonly known as: CYMBALTA Take 60 mg by mouth daily.   Eliquis 5 MG Tabs tablet Generic drug: apixaban Take 5 mg  by mouth every 12 (twelve) hours.   famotidine 40 MG tablet Commonly known as: PEPCID Take 20 mg by mouth at bedtime as needed for heartburn.   finasteride 5 MG tablet Commonly known as: PROSCAR Take 5 mg by mouth daily.   insulin aspart 100 UNIT/ML FlexPen Commonly known as: NOVOLOG Inject 12 Units into the skin 3 (three) times daily with meals.   Insulin Detemir 100 UNIT/ML Pen Commonly known as: LEVEMIR Inject 30 Units into the skin 2 (two) times daily. What changed: how much to take   Multi-Vitamin tablet Take 1 tablet by mouth daily.   nitroGLYCERIN 0.4 MG SL tablet Commonly known as: NITROSTAT Place 0.4 mg under the tongue as needed.   torsemide 20 MG tablet Commonly known as: DEMADEX Take 2 tablets (40 mg total) by mouth daily. Start taking on: October 21, 2019   Vitamin D 50 MCG (2000 UT) tablet Take 2,000 Units by mouth daily.      Allergies  Allergen Reactions  . Metformin And Related Diarrhea    Intolerance due to naturally loose bowels    The results of significant diagnostics from this hospitalization (including imaging, microbiology, ancillary and laboratory) are listed below for reference.    Significant Diagnostic Studies: CT HEAD WO CONTRAST  Result Date:  10/15/2019 CLINICAL DATA:  Head trauma EXAM: CT HEAD WITHOUT CONTRAST TECHNIQUE: Contiguous axial images were obtained from the base of the skull through the vertex without intravenous contrast. COMPARISON:  None. FINDINGS: Brain: No evidence of acute territorial infarction, hemorrhage, hydrocephalus,extra-axial collection or mass lesion/mass effect. There is dilatation the ventricles and sulci consistent with age-related atrophy. Low-attenuation changes in the deep white matter consistent with small vessel ischemia. Vascular: No hyperdense vessel or unexpected calcification. Skull: The skull is intact. No fracture or focal lesion identified. Sinuses/Orbits: The visualized paranasal sinuses and mastoid  air cells are clear. The orbits and globes intact. Other: None IMPRESSION: No acute intracranial abnormality. Findings consistent with age related atrophy and chronic small vessel ischemia Electronically Signed   By: Prudencio Pair M.D.   On: 10/15/2019 03:49   US RENAL  Result Date: 10/16/2019 CLINICAL DATA:  Acute renal failure, chronic kidney disease stage 3. EXAM: RENAL / URINARY TRACT ULTRASOUND COMPLETE COMPARISON:  None. FINDINGS: Right Kidney: Renal measurements: 10.1 x 6.3 x 6.4 cm = volume: 215 mL. Mild increased cortical echogenicity. No mass or hydronephrosis visualized. 6.3 cm cyst over the mid pole. Left Kidney: Renal measurements: 9.5 x 6.1 x 6.3 cm = volume: 193 mL. Mild increased cortical echogenicity. No mass or hydronephrosis visualized. Bladder: Appears normal for degree of bladder distention. Other: None. IMPRESSION: 1. Normal size kidneys without hydronephrosis. Mild increased cortical echogenicity which can be seen with medical renal disease. 2.  6.3 cm right renal cyst. Electronically Signed   By: [name] M.D.   On: 10/16/2019 09:17   DG Chest Portable 1 View  Result Date: 10/15/2019 CLINICAL DATA:  Shortness of breath, wheezing, positive CHF EXAM: PORTABLE CHEST 1 VIEW COMPARISON:  Radiograph 12/18/2018 FINDINGS: Hazy interstitial opacities more pronounced towards the lung bases with some bandlike areas of opacity likely reflecting superimposed subsegmental atelectasis and/or scarring. Mild cephalization of the pulmonary vascularity with some fissural thickening. Cardiomegaly is similar to comparison accounting for differences in technique. No visible effusion or pneumothorax. No acute osseous or soft tissue abnormality. Telemetry leads overlie the chest. IMPRESSION: Features compatible with CHF including cardiomegaly and mild interstitial edema. More bandlike opacities likely reflect subsegmental atelectasis or scarring. Electronically Signed   By: Lovena Le M.D.   On:  10/15/2019 00:06   ECHOCARDIOGRAM COMPLETE  Result Date: 10/15/2019   ECHOCARDIOGRAM REPORT   Patient Name:   [name] Date of Exam: 10/15/2019 Medical Rec #:  [id number]       Height:       71.0 in Accession #:    [id number]      Weight:       275.0 lb Date of Birth:  Feb 16, 1932       BSA:          2.41 m Patient Age:    15 years        BP:           140/60 mmHg Patient Gender: M               HR:           67 bpm. Exam Location:  [facility] Procedure: 2D Echo, Color Doppler and Cardiac Doppler Indications:     I50.9 Congestive Heart Failure  History:         Patient has no prior history of Echocardiogram examinations.                  CAD and Previous Myocardial Infarction, PVD  and COPD; Risk                  Factors:Diabetes, Hypertension and Dyslipidemia.  Sonographer:     Charmayne Sheer RDCS (AE) Referring Phys:  [id number] Athena Masse Diagnosing Phys: Ida Rogue MD  Sonographer Comments: Suboptimal parasternal window and no subcostal window. Pt moving throughout study due to SOB and Image acquisition challenging due to COPD. IMPRESSIONS  1. Left ventricular ejection fraction, by visual estimation, is 60 to 65%. The left ventricle has normal function. There is no left ventricular hypertrophy.  2. Left ventricular diastolic parameters are consistent with Grade I diastolic dysfunction (impaired relaxation).  3. The left ventricle has no regional wall motion abnormalities.  4. Global right ventricle has normal systolic function.The right ventricular size is normal. No increase in right ventricular wall thickness.  5. Left atrial size was mildly dilated.  6. Moderately elevated pulmonary artery systolic pressure.  7. The tricuspid regurgitant velocity is 3.12 m/s, and with an assumed right atrial pressure of 10 mmHg, the estimated right ventricular systolic pressure is moderately elevated at 48.9 mmHg. FINDINGS  Left Ventricle: Left ventricular ejection fraction, by visual estimation, is 60 to 65%. The left  ventricle has normal function. The left ventricle has no regional wall motion abnormalities. There is no left ventricular hypertrophy. Left ventricular diastolic parameters are consistent with Grade I diastolic dysfunction (impaired relaxation). Normal left atrial pressure. Right Ventricle: The right ventricular size is normal. No increase in right ventricular wall thickness. Global RV systolic function is has normal systolic function. The tricuspid regurgitant velocity is 3.12 m/s, and with an assumed right atrial pressure  of 10 mmHg, the estimated right ventricular systolic pressure is moderately elevated at 48.9 mmHg. Left Atrium: Left atrial size was mildly dilated. Right Atrium: Right atrial size was normal in size Pericardium: There is no evidence of pericardial effusion. Mitral Valve: The mitral valve is normal in structure. Mild mitral valve regurgitation. No evidence of mitral valve stenosis by observation. MV peak gradient, 4.6 mmHg. Tricuspid Valve: The tricuspid valve is normal in structure. Tricuspid valve regurgitation is not demonstrated. Aortic Valve: The aortic valve was not well visualized. Aortic valve regurgitation is not visualized. Mild aortic valve sclerosis is present, with no evidence of aortic valve stenosis. Aortic valve mean gradient measures 7.0 mmHg. Aortic valve peak gradient measures 11.6 mmHg. Aortic valve area, by VTI measures 3.20 cm. Pulmonic Valve: The pulmonic valve was normal in structure. Pulmonic valve regurgitation is not visualized. Pulmonic regurgitation is not visualized. Aorta: The aortic root, ascending aorta and aortic arch are all structurally normal, with no evidence of dilitation or obstruction. Venous: The inferior vena cava is normal in size with greater than 50% respiratory variability, suggesting right atrial pressure of 3 mmHg. IAS/Shunts: No atrial level shunt detected by color flow Doppler. There is no evidence of a patent foramen ovale. No ventricular  septal defect is seen or detected. There is no evidence of an atrial septal defect.  LEFT VENTRICLE PLAX 2D LVIDd:         4.58 cm  Diastology LVIDs:         3.28 cm  LV e' lateral:   9.36 cm/s LV PW:         1.15 cm  LV E/e' lateral: 9.2 LV IVS:        0.82 cm  LV e' medial:    6.64 cm/s LVOT diam:     2.40 cm  LV E/e' medial:  13.0 LV SV:         53 ml LV SV Index:   20.73 LVOT Area:     4.52 cm  RIGHT VENTRICLE RV Basal diam:  3.04 cm LEFT ATRIUM             Index       RIGHT ATRIUM           Index LA diam:        4.40 cm 1.82 cm/m  RA Area:     12.60 cm LA Vol (A2C):   62.9 ml 26.05 ml/m RA Volume:   27.60 ml  11.43 ml/m LA Vol (A4C):   52.2 ml 21.62 ml/m LA Biplane Vol: 57.9 ml 23.98 ml/m  AORTIC VALVE                    PULMONIC VALVE AV Area (Vmax):    3.41 cm     PV Vmax:       1.04 m/s AV Area (Vmean):   3.30 cm     PV Vmean:      71.000 cm/s AV Area (VTI):     3.20 cm     PV VTI:        0.264 m AV Vmax:           170.00 cm/s  PV Peak grad:  4.3 mmHg AV Vmean:          125.000 cm/s PV Mean grad:  2.0 mmHg AV VTI:            0.401 m AV Peak Grad:      11.6 mmHg AV Mean Grad:      7.0 mmHg LVOT Vmax:         128.00 cm/s LVOT Vmean:        91.100 cm/s LVOT VTI:          0.284 m LVOT/AV VTI ratio: 0.71  AORTA Ao Root diam: 3.30 cm MITRAL VALVE                        TRICUSPID VALVE MV Area (PHT): 3.53 cm             TR Peak grad:   38.9 mmHg MV Peak grad:  4.6 mmHg             TR Vmax:        312.00 cm/s MV Mean grad:  2.0 mmHg MV Vmax:       1.07 m/s             SHUNTS MV Vmean:      68.9 cm/s            Systemic VTI:  0.28 m MV VTI:        0.38 m               Systemic Diam: 2.40 cm MV PHT:        62.35 msec MV Decel Time: 215 msec MV E velocity: 86.30 cm/s 103 cm/s MV A velocity: 94.10 cm/s 70.3 cm/s MV E/A ratio:  0.92       1.5  Ida Rogue MD Electronically signed by Ida Rogue MD Signature Date/Time: 10/15/2019/3:57:33 PM    Final     Microbiology: Recent Results (from the past 240  hour(s))  SARS CORONAVIRUS 2 (TAT 6-24 HRS) Nasopharyngeal Nasopharyngeal Swab     Status: None   Collection Time: 10/15/19  1:35 AM  Specimen: Nasopharyngeal Swab  Result Value Ref Range Status   SARS Coronavirus 2 NEGATIVE NEGATIVE Final    Comment: (NOTE) SARS-CoV-2 target nucleic acids are NOT DETECTED. The SARS-CoV-2 RNA is generally detectable in upper and lower respiratory specimens during the acute phase of infection. Negative results do not preclude SARS-CoV-2 infection, do not rule out co-infections with other pathogens, and should not be used as the sole basis for treatment or other patient management decisions. Negative results must be combined with clinical observations, patient history, and epidemiological information. The expected result is Negative. Fact Sheet for Patients: SugarRoll.be Fact Sheet for Healthcare Providers: https://www.woods-mathews.com/ This test is not yet approved or cleared by the [place] FDA and  has been authorized for detection and/or diagnosis of SARS-CoV-2 by FDA under an Emergency Use Authorization (EUA). This EUA will remain  in effect (meaning this test can be used) for the duration of the COVID-19 declaration under Section 56 4(b)(1) of the Act, 21 U.S.C. section 360bbb-3(b)(1), unless the authorization is terminated or revoked sooner. Performed at [facility], [place] [address], [place], [place] [address]   SARS CORONAVIRUS 2 (TAT 6-24 HRS) Nasopharyngeal Nasopharyngeal Swab     Status: None   Collection Time: 10/19/19  9:21 PM   Specimen: Nasopharyngeal Swab  Result Value Ref Range Status   SARS Coronavirus 2 NEGATIVE NEGATIVE Final    Comment: (NOTE) SARS-CoV-2 target nucleic acids are NOT DETECTED. The SARS-CoV-2 RNA is generally detectable in upper and lower respiratory specimens during the acute phase of infection. Negative results do not preclude SARS-CoV-2 infection, do not  rule out co-infections with other pathogens, and should not be used as the sole basis for treatment or other patient management decisions. Negative results must be combined with clinical observations, patient history, and epidemiological information. The expected result is Negative. Fact Sheet for Patients: SugarRoll.be Fact Sheet for Healthcare Providers: https://www.woods-mathews.com/ This test is not yet approved or cleared by the [place] FDA and  has been authorized for detection and/or diagnosis of SARS-CoV-2 by FDA under an Emergency Use Authorization (EUA). This EUA will remain  in effect (meaning this test can be used) for the duration of the COVID-19 declaration under Section 56 4(b)(1) of the Act, 21 U.S.C. section 360bbb-3(b)(1), unless the authorization is terminated or revoked sooner. Performed at [facility], [place] [address] [place], [place] [address]      Labs: Basic Metabolic Panel: Recent Labs  Lab 10/15/19 1336 10/16/19 0629 10/17/19 0528 10/18/19 0919 10/19/19 0651  NA 130* 130* 132* 130* 133*  K 4.5 4.0 3.6 3.4* 3.5  CL 91* 91* 92* 91* 92*  CO2 26 26 28 26 27   GLUCOSE 223* 224* 120* 136* 110*  BUN 27* 31* 32* 35* 36*  CREATININE 2.12* 2.02* 2.26* 2.21* 2.24*  CALCIUM 8.8* 8.5* 9.0 9.1 9.3  MG  --   --   --   --  2.5*  PHOS  --  4.3  --   --   --    Liver Function Tests: Recent Labs  Lab 10/14/19 2338 10/16/19 0629 10/17/19 0528  AST 93*  --  43*  ALT 86*  --  71*  ALKPHOS 196*  --  146*  BILITOT 0.7  --  1.2  PROT 7.5  --  6.9  ALBUMIN 3.2* 2.7* 3.2*   CBC: Recent Labs  Lab 10/14/19 2338 10/15/19 1336 10/16/19 0629 10/18/19 0448 10/19/19 0651  WBC 10.9* 12.6* 12.3* 11.7* 10.9*  HGB  10.2* 10.0* 9.3* 8.7* 9.5*  HCT 30.6* 30.9* 27.8* 27.3* 29.8*  MCV 81.0 84.2 80.6 85.3 85.1  PLT 356 299 307 317 341    Recent Labs    10/14/19 2338  BNP 114.0*     CBG: Recent Labs  Lab  10/18/19 2220 10/19/19 0744 10/19/19 1126 10/19/19 1643 10/20/19 0716  GLUCAP 169* 116* 178* 176* 170*    Principal Problem:   Acute exacerbation of CHF (congestive heart failure) (HCC) Active Problems:   Anasarca   CAD (coronary artery disease)   Insulin dependent type 2 diabetes mellitus (HCC)   Essential hypertension   Chronic anticoagulation   Acute kidney injury superimposed on CKD (HCC)   CKD (chronic kidney disease) stage 3, GFR 30-59 ml/min   Generalized weakness   Frequent falls   Elevated liver enzymes   Acute on chronic diastolic CHF (congestive heart failure) (HCC)   Elevated LFTs   Time coordinating discharge: 35 minutes  Signed:  Murray Hodgkins, MD  [facility]  10/20/2019, 11:16 AM

## 2019-10-20 NOTE — Progress Notes (Signed)
[facility], [place] 10/20/19  Subjective:   Hospital day # 5    12/30 0701 - 12/31 0700 In: 240 [P.O.:240] Out: 950 [Urine:950] Lab Results  Component Value Date   CREATININE 2.24 (H) 10/19/2019   CREATININE 2.21 (H) 10/18/2019   CREATININE 2.26 (H) 10/17/2019   Doing fair today Denies acute SOB  now has a foley catheter, with yellow urine Sitting up in recliner chair,  States he is able to eat without nausea or vomiting SPEP + for M Spike   Objective:  Vital signs in last 24 hours:  Temp:  [97.9 F (36.6 C)-98.7 F (37.1 C)] 97.9 F (36.6 C) (12/31 0713) Pulse Rate:  [56] 56 (12/31 0714) Resp:  [18-20] 18 (12/31 0713) BP: (125-140)/(55-63) 140/55 (12/31 0714) SpO2:  [99 %-100 %] 99 % (12/31 0714)  Weight change:  Filed Weights   10/16/19 0410 10/17/19 0453 10/19/19 0539  Weight: 129.5 kg 126.7 kg 122.6 kg    Intake/Output:    Intake/Output Summary (Last 24 hours) at 10/20/2019 1010 Last data filed at 10/19/2019 2100 Gross per 24 hour  Intake 240 ml  Output 950 ml  Net -710 ml     Physical Exam: General: NAD, sitting up in recliner chair  HEENT Anicteric, moist oral mucus membranes  Pulm/lungs Coarse breath sounds, Room air, no crackles or wheezing  CVS/Heart Regular, no rub  Abdomen:  Soft, obese, Non tender  Extremities: + dependent edema  Neurologic: Alert, able to answer questions appropriately  Skin: Changes from chronic edema bilaterally          Basic Metabolic Panel:  Recent Labs  Lab 10/15/19 1336 10/16/19 0629 10/17/19 0528 10/18/19 0919 10/19/19 0651  NA 130* 130* 132* 130* 133*  K 4.5 4.0 3.6 3.4* 3.5  CL 91* 91* 92* 91* 92*  CO2 26 26 28 26 27   GLUCOSE 223* 224* 120* 136* 110*  BUN 27* 31* 32* 35* 36*  CREATININE 2.12* 2.02* 2.26* 2.21* 2.24*  CALCIUM 8.8* 8.5* 9.0 9.1 9.3  MG  --   --   --   --  2.5*  PHOS  --  4.3  --   --   --      CBC: Recent Labs  Lab 10/14/19 2338 10/15/19 1336  10/16/19 0629 10/18/19 0448 10/19/19 0651  WBC 10.9* 12.6* 12.3* 11.7* 10.9*  HGB 10.2* 10.0* 9.3* 8.7* 9.5*  HCT 30.6* 30.9* 27.8* 27.3* 29.8*  MCV 81.0 84.2 80.6 85.3 85.1  PLT 356 299 307 317 341      Lab Results  Component Value Date   HEPBSAG NON REACTIVE 10/17/2019   HEPBSAB NON REACTIVE 10/17/2019   HEPBIGM NON REACTIVE 10/17/2019      Microbiology:  Recent Results (from the past 240 hour(s))  SARS CORONAVIRUS 2 (TAT 6-24 HRS) Nasopharyngeal Nasopharyngeal Swab     Status: None   Collection Time: 10/15/19  1:35 AM   Specimen: Nasopharyngeal Swab  Result Value Ref Range Status   SARS Coronavirus 2 NEGATIVE NEGATIVE Final    Comment: (NOTE) SARS-CoV-2 target nucleic acids are NOT DETECTED. The SARS-CoV-2 RNA is generally detectable in upper and lower respiratory specimens during the acute phase of infection. Negative results do not preclude SARS-CoV-2 infection, do not rule out co-infections with other pathogens, and should not be used as the sole basis for treatment or other patient management decisions. Negative results must be combined with clinical observations, patient history, and epidemiological information. The expected result is Negative. Fact  Sheet for Patients: SugarRoll.be Fact Sheet for Healthcare Providers: https://www.woods-mathews.com/ This test is not yet approved or cleared by the [place] FDA and  has been authorized for detection and/or diagnosis of SARS-CoV-2 by FDA under an Emergency Use Authorization (EUA). This EUA will remain  in effect (meaning this test can be used) for the duration of the COVID-19 declaration under Section 56 4(b)(1) of the Act, 21 U.S.C. section 360bbb-3(b)(1), unless the authorization is terminated or revoked sooner. Performed at [facility], [place] [address] [place], [place] [address]   SARS CORONAVIRUS 2 (TAT 6-24 HRS) Nasopharyngeal Nasopharyngeal Swab      Status: None   Collection Time: 10/19/19  9:21 PM   Specimen: Nasopharyngeal Swab  Result Value Ref Range Status   SARS Coronavirus 2 NEGATIVE NEGATIVE Final    Comment: (NOTE) SARS-CoV-2 target nucleic acids are NOT DETECTED. The SARS-CoV-2 RNA is generally detectable in upper and lower respiratory specimens during the acute phase of infection. Negative results do not preclude SARS-CoV-2 infection, do not rule out co-infections with other pathogens, and should not be used as the sole basis for treatment or other patient management decisions. Negative results must be combined with clinical observations, patient history, and epidemiological information. The expected result is Negative. Fact Sheet for Patients: SugarRoll.be Fact Sheet for Healthcare Providers: https://www.woods-mathews.com/ This test is not yet approved or cleared by the [place] FDA and  has been authorized for detection and/or diagnosis of SARS-CoV-2 by FDA under an Emergency Use Authorization (EUA). This EUA will remain  in effect (meaning this test can be used) for the duration of the COVID-19 declaration under Section 56 4(b)(1) of the Act, 21 U.S.C. section 360bbb-3(b)(1), unless the authorization is terminated or revoked sooner. Performed at [facility], [place] [address] [place], [place] [address]     Coagulation Studies: No results for input(s): LABPROT, INR in the last 72 hours.  Urinalysis: No results for input(s): COLORURINE, LABSPEC, PHURINE, GLUCOSEU, HGBUR, BILIRUBINUR, KETONESUR, PROTEINUR, UROBILINOGEN, NITRITE, LEUKOCYTESUR in the last 72 hours.  Invalid input(s): APPERANCEUR    Imaging: No results found.   Medications:   . sodium chloride     . apixaban  5 mg Oral BID  . aspirin EC  81 mg Oral Daily  . atorvastatin  80 mg Oral q1800  . carvedilol  25 mg Oral BID WC  . famotidine  20 mg Oral Daily  . finasteride  5 mg Oral Daily  .  insulin aspart  0-15 Units Subcutaneous TID WC  . insulin detemir  30 Units Subcutaneous Daily  . sodium chloride flush  3 mL Intravenous Q12H  . torsemide  40 mg Oral Daily   sodium chloride, acetaminophen, nitroGLYCERIN, ondansetron (ZOFRAN) IV, sodium chloride flush  Assessment/ Plan:  85 y.o.African American male  With grade 1 diastolic dysfunction (echo 09/2019), Moderate pulm HTN, Diabetes type 2 (and insulin dependent),  CAD, PVD, Gout, OSA, h/o DVT requiring chronic anticoagulation, h/o elevated PSA (followed by Dr Darcus Austin at [facility])  1. ARF on CKD st 3 A (Cr 1.4/GFR 53) on 07/09/2019 No recent iv contrast exposure No h/o excessive NSAIDs but was on voltaren gel as outpatient  Renal U.S neg for obstruction ? Over diuresis with lasix as outpatient SPEP shows small M Spike- WIll need surveillance as outpatient ([facility]) - renal function stable on oral torsemide - follow labs daily while in hospital  2. LE edema Fluid 3rd spacing vs lymphedema. Patient's daughter reports that he has  lymphedema pumps at home Undergoing careful diuresis Diuretic changed to oral torsemide  3. Elevated PSA - followed by oncologist at [facility] - biopsy was recommended in March  4. Diabetes type 2 with CKD - insulin dependent - goal A1c 6.5-8% Lab Results  Component Value Date   HGBA1C 8.1 (H) 10/15/2019   5. Hypokalemia - likely due to diuresis - may supplement orally as necessary    LOS: [place] 12/31/202010:10 AM  [place], [place]  Note: This note was prepared with Dragon dictation. Any transcription errors are unintentional

## 2019-10-20 NOTE — Care Management Important Message (Signed)
Important Message  Patient Details  Name: [name] MRN: [id number] Date of Birth: April 20, 1932   Medicare Important Message Given:  Yes     Dannette Barbara 10/20/2019, 11:21 AM

## 2019-10-24 ENCOUNTER — Telehealth: Payer: Self-pay | Admitting: Family

## 2019-10-24 LAB — PROTEIN ELECTRO, RANDOM URINE
Albumin ELP, Urine: 55.3 %
Alpha-1-Globulin, U: 6.4 %
Alpha-2-Globulin, U: 8.2 %
Beta Globulin, U: 17.5 %
Gamma Globulin, U: 12.6 %
Total Protein, Urine: 14.4 mg/dL

## 2019-10-24 NOTE — Telephone Encounter (Signed)
Spoke with Patients Daughter regarding his new patient appt we scheduled after his recent referral and hospital d/c as well as follow up with how he is doing since his discharge. Daughter states he is doing better but refused to answer questions and stated he is in rehab currently till the end of jan and will follow up with primary care before she makes any more appointments. She states She will call us back to schedule appt.    Alyse Low, [place]

## 2019-10-25 ENCOUNTER — Ambulatory Visit: Payer: Medicare Other | Admitting: Family

## 2019-11-04 ENCOUNTER — Other Ambulatory Visit: Payer: Self-pay

## 2019-11-04 ENCOUNTER — Encounter: Payer: Self-pay | Admitting: Emergency Medicine

## 2019-11-04 ENCOUNTER — Inpatient Hospital Stay
Admission: EM | Admit: 2019-11-04 | Discharge: 2019-11-17 | DRG: 723 | Disposition: A | Discharge status: Skilled Nursing Facility | Payer: Medicare Other | Source: Skilled Nursing Facility | Attending: Internal Medicine | Admitting: Internal Medicine

## 2019-11-04 DIAGNOSIS — I252 Old myocardial infarction: Secondary | ICD-10-CM | POA: Diagnosis not present

## 2019-11-04 DIAGNOSIS — C787 Secondary malignant neoplasm of liver and intrahepatic bile duct: Secondary | ICD-10-CM | POA: Diagnosis present

## 2019-11-04 DIAGNOSIS — D649 Anemia, unspecified: Secondary | ICD-10-CM | POA: Diagnosis not present

## 2019-11-04 DIAGNOSIS — R319 Hematuria, unspecified: Secondary | ICD-10-CM | POA: Diagnosis present

## 2019-11-04 DIAGNOSIS — E1151 Type 2 diabetes mellitus with diabetic peripheral angiopathy without gangrene: Secondary | ICD-10-CM | POA: Diagnosis present

## 2019-11-04 DIAGNOSIS — I10 Essential (primary) hypertension: Secondary | ICD-10-CM | POA: Diagnosis present

## 2019-11-04 DIAGNOSIS — Z79899 Other long term (current) drug therapy: Secondary | ICD-10-CM

## 2019-11-04 DIAGNOSIS — I959 Hypotension, unspecified: Secondary | ICD-10-CM | POA: Diagnosis present

## 2019-11-04 DIAGNOSIS — I872 Venous insufficiency (chronic) (peripheral): Secondary | ICD-10-CM | POA: Diagnosis present

## 2019-11-04 DIAGNOSIS — I824Y2 Acute embolism and thrombosis of unspecified deep veins of left proximal lower extremity: Secondary | ICD-10-CM | POA: Diagnosis present

## 2019-11-04 DIAGNOSIS — D631 Anemia in chronic kidney disease: Secondary | ICD-10-CM | POA: Diagnosis present

## 2019-11-04 DIAGNOSIS — R791 Abnormal coagulation profile: Secondary | ICD-10-CM | POA: Diagnosis not present

## 2019-11-04 DIAGNOSIS — Z955 Presence of coronary angioplasty implant and graft: Secondary | ICD-10-CM | POA: Diagnosis not present

## 2019-11-04 DIAGNOSIS — Z794 Long term (current) use of insulin: Secondary | ICD-10-CM

## 2019-11-04 DIAGNOSIS — N1831 Chronic kidney disease, stage 3a: Secondary | ICD-10-CM | POA: Diagnosis present

## 2019-11-04 DIAGNOSIS — Z87891 Personal history of nicotine dependence: Secondary | ICD-10-CM

## 2019-11-04 DIAGNOSIS — C782 Secondary malignant neoplasm of pleura: Secondary | ICD-10-CM | POA: Diagnosis present

## 2019-11-04 DIAGNOSIS — Z515 Encounter for palliative care: Secondary | ICD-10-CM

## 2019-11-04 DIAGNOSIS — D472 Monoclonal gammopathy: Secondary | ICD-10-CM | POA: Diagnosis not present

## 2019-11-04 DIAGNOSIS — E785 Hyperlipidemia, unspecified: Secondary | ICD-10-CM

## 2019-11-04 DIAGNOSIS — Z7902 Long term (current) use of antithrombotics/antiplatelets: Secondary | ICD-10-CM

## 2019-11-04 DIAGNOSIS — Z86718 Personal history of other venous thrombosis and embolism: Secondary | ICD-10-CM

## 2019-11-04 DIAGNOSIS — R310 Gross hematuria: Secondary | ICD-10-CM | POA: Diagnosis present

## 2019-11-04 DIAGNOSIS — I5032 Chronic diastolic (congestive) heart failure: Secondary | ICD-10-CM | POA: Diagnosis present

## 2019-11-04 DIAGNOSIS — I130 Hypertensive heart and chronic kidney disease with heart failure and stage 1 through stage 4 chronic kidney disease, or unspecified chronic kidney disease: Secondary | ICD-10-CM | POA: Diagnosis present

## 2019-11-04 DIAGNOSIS — M8458XA Pathological fracture in neoplastic disease, other specified site, initial encounter for fracture: Secondary | ICD-10-CM | POA: Diagnosis present

## 2019-11-04 DIAGNOSIS — I824Y1 Acute embolism and thrombosis of unspecified deep veins of right proximal lower extremity: Secondary | ICD-10-CM | POA: Diagnosis not present

## 2019-11-04 DIAGNOSIS — G4733 Obstructive sleep apnea (adult) (pediatric): Secondary | ICD-10-CM

## 2019-11-04 DIAGNOSIS — N183 Chronic kidney disease, stage 3 unspecified: Secondary | ICD-10-CM | POA: Diagnosis present

## 2019-11-04 DIAGNOSIS — D62 Acute posthemorrhagic anemia: Secondary | ICD-10-CM

## 2019-11-04 DIAGNOSIS — N1339 Other hydronephrosis: Secondary | ICD-10-CM | POA: Diagnosis present

## 2019-11-04 DIAGNOSIS — I82409 Acute embolism and thrombosis of unspecified deep veins of unspecified lower extremity: Secondary | ICD-10-CM | POA: Diagnosis present

## 2019-11-04 DIAGNOSIS — Z7982 Long term (current) use of aspirin: Secondary | ICD-10-CM

## 2019-11-04 DIAGNOSIS — Z7901 Long term (current) use of anticoagulants: Secondary | ICD-10-CM

## 2019-11-04 DIAGNOSIS — Z9989 Dependence on other enabling machines and devices: Secondary | ICD-10-CM | POA: Diagnosis not present

## 2019-11-04 DIAGNOSIS — N1832 Chronic kidney disease, stage 3b: Secondary | ICD-10-CM

## 2019-11-04 DIAGNOSIS — R7989 Other specified abnormal findings of blood chemistry: Secondary | ICD-10-CM | POA: Diagnosis not present

## 2019-11-04 DIAGNOSIS — I8290 Acute embolism and thrombosis of unspecified vein: Secondary | ICD-10-CM

## 2019-11-04 DIAGNOSIS — N400 Enlarged prostate without lower urinary tract symptoms: Secondary | ICD-10-CM | POA: Diagnosis present

## 2019-11-04 DIAGNOSIS — I2510 Atherosclerotic heart disease of native coronary artery without angina pectoris: Secondary | ICD-10-CM | POA: Diagnosis not present

## 2019-11-04 DIAGNOSIS — C61 Malignant neoplasm of prostate: Secondary | ICD-10-CM | POA: Diagnosis present

## 2019-11-04 DIAGNOSIS — Z6835 Body mass index (BMI) 35.0-35.9, adult: Secondary | ICD-10-CM

## 2019-11-04 DIAGNOSIS — E119 Type 2 diabetes mellitus without complications: Secondary | ICD-10-CM | POA: Diagnosis not present

## 2019-11-04 DIAGNOSIS — Z20822 Contact with and (suspected) exposure to covid-19: Secondary | ICD-10-CM | POA: Diagnosis present

## 2019-11-04 DIAGNOSIS — Z7189 Other specified counseling: Secondary | ICD-10-CM

## 2019-11-04 DIAGNOSIS — C7951 Secondary malignant neoplasm of bone: Secondary | ICD-10-CM

## 2019-11-04 DIAGNOSIS — E1122 Type 2 diabetes mellitus with diabetic chronic kidney disease: Secondary | ICD-10-CM | POA: Diagnosis present

## 2019-11-04 DIAGNOSIS — R296 Repeated falls: Secondary | ICD-10-CM | POA: Diagnosis present

## 2019-11-04 DIAGNOSIS — N3289 Other specified disorders of bladder: Secondary | ICD-10-CM | POA: Diagnosis present

## 2019-11-04 LAB — URINALYSIS, COMPLETE (UACMP) WITH MICROSCOPIC
Bacteria, UA: NONE SEEN
RBC / HPF: >50 RBC/hpf — ABNORMAL HIGH (ref 0–5)
Specific Gravity, Urine: 1.025 (ref 1.005–1.030)
Squamous Epithelial / HPF: NONE SEEN (ref 0–5)
WBC, UA: >50 WBC/hpf — ABNORMAL HIGH (ref 0–5)

## 2019-11-04 LAB — POC SARS CORONAVIRUS 2 AG: SARS Coronavirus 2 Ag: NEGATIVE

## 2019-11-04 LAB — CBC WITH DIFFERENTIAL/PLATELET
Abs Immature Granulocytes: 0.08 10*3/uL — ABNORMAL HIGH (ref 0.00–0.07)
Basophils Absolute: 0 10*3/uL (ref 0.0–0.1)
Basophils Relative: 0 %
Eosinophils Absolute: 0.1 10*3/uL (ref 0.0–0.5)
Eosinophils Relative: 1 %
HCT: 26.1 % — ABNORMAL LOW (ref 39.0–52.0)
Hemoglobin: 8.3 g/dL — ABNORMAL LOW (ref 13.0–17.0)
Immature Granulocytes: 1 %
Lymphocytes Relative: 7 %
Lymphs Abs: 0.8 10*3/uL (ref 0.7–4.0)
MCH: 27.3 pg (ref 26.0–34.0)
MCHC: 31.8 g/dL (ref 30.0–36.0)
MCV: 85.9 fL (ref 80.0–100.0)
Monocytes Absolute: 1.2 10*3/uL — ABNORMAL HIGH (ref 0.1–1.0)
Monocytes Relative: 10 %
Neutro Abs: 9.8 10*3/uL — ABNORMAL HIGH (ref 1.7–7.7)
Neutrophils Relative %: 81 %
Platelets: 262 10*3/uL (ref 150–400)
RBC: 3.04 MIL/uL — ABNORMAL LOW (ref 4.22–5.81)
RDW: 14.7 % (ref 11.5–15.5)
WBC: 11.9 10*3/uL — ABNORMAL HIGH (ref 4.0–10.5)
nRBC: 0 % (ref 0.0–0.2)

## 2019-11-04 LAB — COMPREHENSIVE METABOLIC PANEL
ALT: 45 U/L — ABNORMAL HIGH (ref 0–44)
AST: 43 U/L — ABNORMAL HIGH (ref 15–41)
Albumin: 3.2 g/dL — ABNORMAL LOW (ref 3.5–5.0)
Alkaline Phosphatase: 159 U/L — ABNORMAL HIGH (ref 38–126)
Anion gap: 10 (ref 5–15)
BUN: 27 mg/dL — ABNORMAL HIGH (ref 8–23)
CO2: 24 mmol/L (ref 22–32)
Calcium: 9.3 mg/dL (ref 8.9–10.3)
Chloride: 100 mmol/L (ref 98–111)
Creatinine, Ser: 1.98 mg/dL — ABNORMAL HIGH (ref 0.61–1.24)
GFR calc Af Amer: 35 mL/min — ABNORMAL LOW (ref >60)
GFR calc non Af Amer: 30 mL/min — ABNORMAL LOW (ref >60)
Glucose, Bld: 136 mg/dL — ABNORMAL HIGH (ref 70–99)
Potassium: 4.6 mmol/L (ref 3.5–5.1)
Sodium: 134 mmol/L — ABNORMAL LOW (ref 135–145)
Total Bilirubin: 1.1 mg/dL (ref 0.3–1.2)
Total Protein: 7 g/dL (ref 6.5–8.1)

## 2019-11-04 LAB — PROTIME-INR
INR: 2 — ABNORMAL HIGH (ref 0.8–1.2)
Prothrombin Time: 22.4 seconds — ABNORMAL HIGH (ref 11.4–15.2)

## 2019-11-04 LAB — APTT: aPTT: 53 seconds — ABNORMAL HIGH (ref 24–36)

## 2019-11-04 LAB — LIPASE, BLOOD: Lipase: 20 U/L (ref 11–51)

## 2019-11-04 MED ORDER — SODIUM CHLORIDE 0.9 % IR SOLN
3000.0000 mL | Status: DC
  Administered 2019-11-04 – 2019-11-14 (×2): 3000 mL

## 2019-11-04 MED ORDER — FINASTERIDE 5 MG PO TABS
5.0000 mg | ORAL_TABLET | Freq: Every day | ORAL | Status: DC
  Administered 2019-11-04 – 2019-11-17 (×13): 5 mg via ORAL
  Filled 2019-11-04 (×14): qty 1

## 2019-11-04 NOTE — ED Triage Notes (Signed)
Pt arrived today via [facility] from [facility]. Staff said pt had hematuria for x 2 days. Pt was passing clots today and had increasing weakness.   were stable for EMS and  BG 198.

## 2019-11-04 NOTE — ED Provider Notes (Signed)
[facility] Emergency Department Provider Note  ____________________________________________   First MD Initiated Contact with Patient 11/04/19 1242     (approximate)  I have reviewed the triage vital signs and the nursing notes.  History  Chief Complaint Hematuria    HPI [name] is a 86 y.o. male lives at [facility] with hx of BPH, COPD, DVT on Eliquis,  CKD with hematuria, DM, who presents to the emergency department for hematuria.  Patient has reportedly had some mild hematuria for several days now, but today they noticed passage of clots.  Per EMS, his health care facility took his blood pressure and noted to be low, prompting them to initiate EMS.  On arrival to the emergency department he is normotensive.  He does report hematuria, but denies any dysuria or malodorous urine.  He reports some vomiting and diarrhea over the last several days. Not known to be COVID positive.  Denies any fevers, cough, chest pain, abdominal pain.   Past Medical Hx Past Medical History:  Diagnosis Date  . Anemia, normocytic normochromic   . BPH (benign prostatic hyperplasia)   . CAD (coronary artery disease)   . Cataracts, bilateral   . Chronic renal insufficiency   . Colon polyp   . COPD (chronic obstructive pulmonary disease) ([place])   . Diabetes mellitus without complication ([place])   . DVT (deep venous thrombosis) ([place])   . Edema of both legs   . Glaucoma   . Gout   . Heart murmur   . Hepatitis C antibody test positive 10/20/2019  . Hyperlipidemia   . Hypertension   . Moderate persistent asthma without complication   . Myocardial infarction ([place])   . Neuromuscular disorder ([place])   . Neuropathy   . Peripheral vascular disease ([place])   . Premature ejaculation   . Prostate cancer ([place])   . RLS (restless legs syndrome)   . Sleep apnea   . Stented coronary artery 11/13/2015  . Uncontrolled type 2 diabetes mellitus with stage 3 chronic kidney  disease, with long-term current use of insulin ([place])     Problem List Patient Active Problem List   Diagnosis Date Noted  . Hepatitis C antibody test positive 10/20/2019  . Anasarca 10/15/2019  . Acute exacerbation of CHF (congestive heart failure) ([place]) 10/15/2019  . CAD (coronary artery disease) 10/15/2019  . Insulin dependent type 2 diabetes mellitus ([place]) 10/15/2019  . Essential hypertension 10/15/2019  . Chronic anticoagulation 10/15/2019  . Acute kidney injury superimposed on CKD ([place]) 10/15/2019  . CKD (chronic kidney disease) stage 3, GFR 30-59 ml/min 10/15/2019  . Generalized weakness 10/15/2019  . Frequent falls 10/15/2019  . Elevated liver enzymes 10/15/2019  . Acute on chronic diastolic CHF (congestive heart failure) ([place]) 10/15/2019  . Elevated LFTs     Past Surgical Hx Past Surgical History:  Procedure Laterality Date  . APPENDECTOMY    . [place]  2009  . COLONOSCOPY  2012  . EYE SURGERY    . PROSTATE SURGERY    . TONSILLECTOMY      Medications Prior to Admission medications   Medication Sig Start Date End Date Taking? Authorizing Provider  aspirin 81 MG chewable tablet Chew 81 mg by mouth daily.  07/15/19  Yes [provider]  atorvastatin (LIPITOR) 80 MG tablet Take 80 mg by mouth daily. 11/15/15  Yes [provider]  carvedilol (COREG) 25 MG tablet Take 25 mg by mouth 2 (two) times daily with a meal.  Yes [provider]  Cholecalciferol (VITAMIN D) 50 MCG (2000 UT) tablet Take 2,000 Units by mouth daily.    Yes [provider]  clopidogrel (PLAVIX) 75 MG tablet Take 75 mg by mouth daily. 11/12/16  Yes [provider]  DULoxetine (CYMBALTA) 60 MG capsule Take 60 mg by mouth daily.   Yes [provider]  ELIQUIS 5 MG TABS tablet Take 5 mg by mouth every 12 (twelve) hours.  10/03/19  Yes [provider]  famotidine (PEPCID) 20 MG tablet Take 20 mg by mouth at bedtime as needed for  heartburn.  08/26/19 08/25/20 Yes [provider]  finasteride (PROSCAR) 5 MG tablet Take 5 mg by mouth daily. 05/22/14  Yes [provider]  insulin aspart (NOVOLOG) 100 UNIT/ML FlexPen Inject 12 Units into the skin 3 (three) times daily with meals.    Yes [provider]  Insulin Detemir (LEVEMIR) 100 UNIT/ML Pen Inject 30 Units into the skin 2 (two) times daily. 10/20/19  Yes Samuella Cota, MD  Multiple Vitamin (MULTI-VITAMIN) tablet Take 1 tablet by mouth daily.   Yes [provider]  nitroGLYCERIN (NITROSTAT) 0.4 MG SL tablet Place 0.4 mg under the tongue every 5 (five) minutes as needed for chest pain.    Yes [provider]  torsemide (DEMADEX) 20 MG tablet Take 2 tablets (40 mg total) by mouth daily. Patient not taking: Reported on 11/04/2019 10/21/19   Samuella Cota, MD    Allergies Metformin and related  Family Hx History reviewed. No pertinent family history.  Social Hx Social History   Tobacco Use  . Smoking status: Former Research scientist (life sciences)  . Smokeless tobacco: Never Used  Substance Use Topics  . Alcohol use: Not on file  . Drug use: Not on file     Review of Systems  Constitutional: Negative for fever, chills. Eyes: Negative for visual changes. ENT: Negative for sore throat. Cardiovascular: Negative for chest pain. Respiratory: Negative for shortness of breath. Gastrointestinal: + for vomiting, diarrhea.  Genitourinary: + hematuria Musculoskeletal: Negative for leg swelling. Skin: Negative for rash. Neurological: Negative for for headaches.   Physical Exam  Vital Signs: ED Triage Vitals  Enc Vitals Group     BP 11/04/19 1235 139/62     Pulse Rate 11/04/19 1235 71     Resp 11/04/19 1235 14     Temp 11/04/19 1240 98.3 F (36.8 C)     Temp Source 11/04/19 1240 Oral     SpO2 11/04/19 1235 97 %     Weight 11/04/19 1241 272 lb (123.4 kg)     Height 11/04/19 1241 5\' 11"  (1.803 m)     Head Circumference --      Peak  Flow --      Pain Score 11/04/19 1246 0     Pain Loc --      Pain Edu? --      Excl. in [place]? --     Constitutional: Alert and oriented. Obese.  Head: Normocephalic. Atraumatic. Eyes: Conjunctivae clear. Sclera anicteric. Nose: No congestion. No rhinorrhea. Mouth/Throat: Wearing mask.  Neck: No stridor.   Cardiovascular: Normal rate, regular rhythm. Extremities well perfused. Respiratory: Normal respiratory effort.  Normal WOB. Gastrointestinal: Soft. Non-tender. Non-distended.  Musculoskeletal: No lower extremity edema. No deformities. Neurologic:  Normal speech and language. No gross focal neurologic deficits are appreciated.  Skin: Skin is warm, dry and intact. No rash noted. Psychiatric: Mood and affect are appropriate for situation.  EKG  Personally reviewed.  Rate: 70 Rhythm: sinus Axis: normal Intervals: WNL No acute ischemic changes No STEMI    Procedures  Procedure(s) performed (including critical care):  Procedures   Initial Impression / Assessment and Plan / ED Course  86 y.o. male who presents to the ED for hematuria, as above. On anticoagulation for hx of DVT. Reportedly hypotensive at his facility but normotensive here. Also complains of some GI symptoms.   Will obtain labs, coags, bladder scan, UA, COVID testing (given GI symptoms and residing at living facility).   Bladder scan for 300 to 400 cc urine.  On I&O catheter, small amount of urine obtained, then unable to drain remainder of bladder.  Suspect due to clotting.  Will place a three-way Foley catheter and plan for bladder irrigation.  Labs otherwise near baseline.  He has a history of anemia, does not appear to have an acute on chronic anemia requiring transfusion today - hemoglobin 8.3 from 8.7-9.5 baseline.  Creatinine slightly improved from baseline. Slightly elevated LFTs, similar to prior. COVID antigen testing negative, will obtain PCR.  Will plan for 3 way Foley catheter and bladder  irrigation. If clears and improves, consider discharge with Foley in place and outpatient urology follow up. If remains bloody, would need urology consult/possible admission.    Final Clinical Impression(s) / ED Diagnosis  Final diagnoses:  Hematuria, unspecified type       Note:  This document was prepared using Dragon voice recognition software and may include unintentional dictation errors.   Lilia Pro., MD 11/04/19 [phone]

## 2019-11-04 NOTE — ED Notes (Signed)
Bladder scan performed on pt. Pt scanner results read 323mL, Rn Mitch notified.

## 2019-11-04 NOTE — ED Notes (Signed)
Emptied 3500 ml from drainage bags w/ CBI

## 2019-11-04 NOTE — ED Provider Notes (Signed)
-----------------------------------------   3:49 PM on 11/04/2019 -----------------------------------------  Blood pressure 139/62, pulse 70, temperature 98.3 F (36.8 C), temperature source Oral, resp. rate 16, height 5\' 11"  (1.803 m), weight 123.4 kg, SpO2 98 %.  Assuming care from Dr. Joan Mayans.  In short, [name] is a 86 y.o. male with a chief complaint of Hematuria .  Refer to the original H&P for additional details.  The current plan of care is to monitor following placement of three-way Foley for CBI and if patient continues to bleed, will likely require urology consultation and admission.  After completing 3 L of CBI, patient continues to put out dark red urine via his Foley catheter.  Dr. Gloriann Loan of urology was consulted, and recommends admitting to medicine for further management, he will see the patient in consultation.  Case discussed with hospitalist, who accepts patient for admission.      Blake Divine, MD 11/04/19 2236

## 2019-11-04 NOTE — Consult Note (Signed)
H&P Physician requesting consult: Blake Divine  Chief Complaint: Gross hematuria  History of Present Illness: 86 year old male with a history of BPH, COPD, CHF, DVT on Eliquis, CKD and diabetes presented to the emergency department with gross hematuria.  He currently resides at [facility].  He has had some mild hematuria for a few days but was noted to have low blood pressure today that prompted a visit to the emergency department.  Patient denies dysuria.  Covid test pending.  In the emergency department, he was noted to have a hemoglobin of 8.3 which is down from 9.5.  Creatinine is stable and is 1.98.  INR is 2.  Emergency department placed a 20 French three-way catheter and initiated continuous bladder irrigation.  Upon my arrival, the back and run out and he had some clot in his bladder.  Patient is resting comfortably and has no complaints.  He was a poor historian.  Past Medical History:  Diagnosis Date  . Anemia, normocytic normochromic   . BPH (benign prostatic hyperplasia)   . CAD (coronary artery disease)   . Cataracts, bilateral   . Chronic renal insufficiency   . Colon polyp   . COPD (chronic obstructive pulmonary disease) ([place])   . Diabetes mellitus without complication ([place])   . DVT (deep venous thrombosis) ([place])   . Edema of both legs   . Glaucoma   . Gout   . Heart murmur   . Hepatitis C antibody test positive 10/20/2019  . Hyperlipidemia   . Hypertension   . Moderate persistent asthma without complication   . Myocardial infarction ([place])   . Neuromuscular disorder ([place])   . Neuropathy   . Peripheral vascular disease ([place])   . Premature ejaculation   . Prostate cancer ([place])   . RLS (restless legs syndrome)   . Sleep apnea   . Stented coronary artery 11/13/2015  . Uncontrolled type 2 diabetes mellitus with stage 3 chronic kidney disease, with long-term current use of insulin ([place])    Past Surgical History:  Procedure Laterality Date  . APPENDECTOMY     . [place]  2009  . COLONOSCOPY  2012  . EYE SURGERY    . PROSTATE SURGERY    . TONSILLECTOMY      Home Medications:  (Not in a hospital admission)  Allergies:  Allergies  Allergen Reactions  . Metformin And Related Diarrhea    Intolerance due to naturally loose bowels    History reviewed. No pertinent family history. Social History:  reports that he has quit smoking. He has never used smokeless tobacco. No history on file for alcohol and drug.  ROS: A complete review of systems was performed.  All systems are negative except for pertinent findings as noted. ROS   Physical Exam:  Vital signs in last 24 hours: Temp:  [98.3 F (36.8 C)] 98.3 F (36.8 C) (01/15 1240) Pulse Rate:  [65-71] 65 (01/15 1830) Resp:  [14-19] 19 (01/15 1830) BP: (100-139)/(39-68) 137/66 (01/15 1830) SpO2:  [94 %-100 %] 94 % (01/15 1830) Weight:  [123.4 kg] 123.4 kg (01/15 1241) General:  Alert, No acute distress HEENT: Normocephalic, atraumatic Neck: No JVD or lymphadenopathy Cardiovascular: Regular rate and rhythm Lungs: Regular rate and effort Abdomen: Soft, nontender, nondistended, no abdominal masses Genitourinary: Patient is circumcised phallus.  Three-way Foley catheter in place had clotted off. Back: No CVA tenderness Extremities: No edema Neurologic: Grossly intact  Laboratory Data:  Results for orders placed or performed during the hospital encounter  of 11/04/19 (from the past 24 hour(s))  Comprehensive metabolic panel     Status: Abnormal   Collection Time: 11/04/19 12:48 PM  Result Value Ref Range   Sodium 134 (L) 135 - 145 mmol/L   Potassium 4.6 3.5 - 5.1 mmol/L   Chloride 100 98 - 111 mmol/L   CO2 24 22 - 32 mmol/L   Glucose, Bld 136 (H) 70 - 99 mg/dL   BUN 27 (H) 8 - 23 mg/dL   Creatinine, Ser 1.98 (H) 0.61 - 1.24 mg/dL   Calcium 9.3 8.9 - 10.3 mg/dL   Total Protein 7.0 6.5 - 8.1 g/dL   Albumin 3.2 (L) 3.5 - 5.0 g/dL   AST 43 (H) 15 - 41 U/L   ALT 45 (H)  0 - 44 U/L   Alkaline Phosphatase 159 (H) 38 - 126 U/L   Total Bilirubin 1.1 0.3 - 1.2 mg/dL   GFR calc non Af Amer 30 (L) >60 mL/min   GFR calc Af Amer 35 (L) >60 mL/min   Anion gap 10 5 - 15  CBC with Differential     Status: Abnormal   Collection Time: 11/04/19 12:48 PM  Result Value Ref Range   WBC 11.9 (H) 4.0 - 10.5 K/uL   RBC 3.04 (L) 4.22 - 5.81 MIL/uL   Hemoglobin 8.3 (L) 13.0 - 17.0 g/dL   HCT 26.1 (L) 39.0 - 52.0 %   MCV 85.9 80.0 - 100.0 fL   MCH 27.3 26.0 - 34.0 pg   MCHC 31.8 30.0 - 36.0 g/dL   RDW 14.7 11.5 - 15.5 %   Platelets 262 150 - 400 K/uL   nRBC 0.0 0.0 - 0.2 %   Neutrophils Relative % 81 %   Neutro Abs 9.8 (H) 1.7 - 7.7 K/uL   Lymphocytes Relative 7 %   Lymphs Abs 0.8 0.7 - 4.0 K/uL   Monocytes Relative 10 %   Monocytes Absolute 1.2 (H) 0.1 - 1.0 K/uL   Eosinophils Relative 1 %   Eosinophils Absolute 0.1 0.0 - 0.5 K/uL   Basophils Relative 0 %   Basophils Absolute 0.0 0.0 - 0.1 K/uL   Immature Granulocytes 1 %   Abs Immature Granulocytes 0.08 (H) 0.00 - 0.07 K/uL  Lipase, blood     Status: None   Collection Time: 11/04/19 12:48 PM  Result Value Ref Range   Lipase 20 11 - 51 U/L  Type and screen [place]     Status: None   Collection Time: 11/04/19  3:18 PM  Result Value Ref Range   ABO/RH(D) AB POS    Antibody Screen NEG    Sample Expiration      11/07/2019,2359 Performed at [facility], [address] [place], [place] [address]   Protime-INR     Status: Abnormal   Collection Time: 11/04/19  3:27 PM  Result Value Ref Range   Prothrombin Time 22.4 (H) 11.4 - 15.2 seconds   INR 2.0 (H) 0.8 - 1.2  APTT     Status: Abnormal   Collection Time: 11/04/19  3:27 PM  Result Value Ref Range   aPTT 53 (H) 24 - 36 seconds  POC SARS Coronavirus 2 Ag     Status: None   Collection Time: 11/04/19  3:38 PM  Result Value Ref Range   SARS Coronavirus 2 Ag NEGATIVE NEGATIVE  Urinalysis, Complete w Microscopic     Status:  Abnormal   Collection Time: 11/04/19  3:52 PM  Result  Value Ref Range   Color, Urine RED (A) YELLOW   APPearance TURBID (A) CLEAR   Specific Gravity, Urine 1.025 1.005 - 1.030   pH  5.0 - 8.0    TEST NOT REPORTED DUE TO COLOR INTERFERENCE OF URINE PIGMENT   Glucose, UA (A) NEGATIVE mg/dL    TEST NOT REPORTED DUE TO COLOR INTERFERENCE OF URINE PIGMENT   Hgb urine dipstick (A) NEGATIVE    TEST NOT REPORTED DUE TO COLOR INTERFERENCE OF URINE PIGMENT   Bilirubin Urine (A) NEGATIVE    TEST NOT REPORTED DUE TO COLOR INTERFERENCE OF URINE PIGMENT   Ketones, ur (A) NEGATIVE mg/dL    TEST NOT REPORTED DUE TO COLOR INTERFERENCE OF URINE PIGMENT   Protein, ur (A) NEGATIVE mg/dL    TEST NOT REPORTED DUE TO COLOR INTERFERENCE OF URINE PIGMENT   Nitrite (A) NEGATIVE    TEST NOT REPORTED DUE TO COLOR INTERFERENCE OF URINE PIGMENT   Leukocytes,Ua (A) NEGATIVE    TEST NOT REPORTED DUE TO COLOR INTERFERENCE OF URINE PIGMENT   RBC / HPF >50 (H) 0 - 5 RBC/hpf   WBC, UA >50 (H) 0 - 5 WBC/hpf   Bacteria, UA NONE SEEN NONE SEEN   Squamous Epithelial / LPF NONE SEEN 0 - 5   WBC Clumps PRESENT    No results found for this or any previous visit (from the past 240 hour(s)). Creatinine: Recent Labs    11/04/19 1248  CREATININE 1.98*   Renal ultrasound: Renal ultrasound on 10/16/2019 showed normal-sized kidneys without hydronephrosis.  Showed evidence of medical renal disease.  He had a small right renal cyst but no concern for malignancy on ultrasound.  No obvious bladder mass.  Procedure: Under sterile conditions, I irrigated the bladder with about 1 L of normal saline with return of moderate clot.  This cleared the catheter.  I then fixed the set up for continuous bladder irrigation so they could run 2 bags and hopefully decrease the amount of nursing burden in the emergency department.  Impression/Assessment:  Gross hematuria Acute blood loss anemia  Plan:  Recommend holding  anticoagulation Start IV fluids Low threshold for transfusion given his cardiac history and active bleeding.  Recheck hemoglobin now If bleeding continues, I will obtain a CT of the abdomen and pelvis without contrast N.p.o. at midnight, awaiting Covid screen Start finasteride given the possibility of prostatic bleeding.  I put in orders for continuous bladder irrigation, hemoglobin hematocrit now, and finasteride 5 mg daily to start [place], III 11/04/2019, 8:56 PM

## 2019-11-04 NOTE — ED Notes (Signed)
Emptied 675 ml from drainage bag w/ CBI

## 2019-11-04 NOTE — ED Notes (Signed)
Emptied drainage bag of 2550 ml from CBI

## 2019-11-05 ENCOUNTER — Other Ambulatory Visit: Payer: Self-pay

## 2019-11-05 ENCOUNTER — Inpatient Hospital Stay: Payer: Medicare Other

## 2019-11-05 DIAGNOSIS — I824Y2 Acute embolism and thrombosis of unspecified deep veins of left proximal lower extremity: Secondary | ICD-10-CM | POA: Diagnosis present

## 2019-11-05 DIAGNOSIS — G4733 Obstructive sleep apnea (adult) (pediatric): Secondary | ICD-10-CM

## 2019-11-05 DIAGNOSIS — D62 Acute posthemorrhagic anemia: Secondary | ICD-10-CM | POA: Diagnosis present

## 2019-11-05 DIAGNOSIS — I82409 Acute embolism and thrombosis of unspecified deep veins of unspecified lower extremity: Secondary | ICD-10-CM | POA: Diagnosis present

## 2019-11-05 DIAGNOSIS — Z7901 Long term (current) use of anticoagulants: Secondary | ICD-10-CM

## 2019-11-05 DIAGNOSIS — R791 Abnormal coagulation profile: Secondary | ICD-10-CM | POA: Insufficient documentation

## 2019-11-05 DIAGNOSIS — N1831 Chronic kidney disease, stage 3a: Secondary | ICD-10-CM

## 2019-11-05 DIAGNOSIS — I5032 Chronic diastolic (congestive) heart failure: Secondary | ICD-10-CM

## 2019-11-05 DIAGNOSIS — N183 Chronic kidney disease, stage 3 unspecified: Secondary | ICD-10-CM

## 2019-11-05 DIAGNOSIS — R310 Gross hematuria: Secondary | ICD-10-CM

## 2019-11-05 DIAGNOSIS — Z9989 Dependence on other enabling machines and devices: Secondary | ICD-10-CM

## 2019-11-05 DIAGNOSIS — I824Y1 Acute embolism and thrombosis of unspecified deep veins of right proximal lower extremity: Secondary | ICD-10-CM

## 2019-11-05 DIAGNOSIS — E785 Hyperlipidemia, unspecified: Secondary | ICD-10-CM | POA: Diagnosis present

## 2019-11-05 LAB — HEMOGLOBIN AND HEMATOCRIT, BLOOD
HCT: 24.1 % — ABNORMAL LOW (ref 39.0–52.0)
HCT: 25.2 % — ABNORMAL LOW (ref 39.0–52.0)
HCT: 27 % — ABNORMAL LOW (ref 39.0–52.0)
Hemoglobin: 7.9 g/dL — ABNORMAL LOW (ref 13.0–17.0)
Hemoglobin: 7.9 g/dL — ABNORMAL LOW (ref 13.0–17.0)
Hemoglobin: 8.6 g/dL — ABNORMAL LOW (ref 13.0–17.0)

## 2019-11-05 LAB — URINE CULTURE: Culture: <10000 — AB

## 2019-11-05 LAB — GLUCOSE, CAPILLARY
Glucose-Capillary: 138 mg/dL — ABNORMAL HIGH (ref 70–99)
Glucose-Capillary: 132 mg/dL — ABNORMAL HIGH (ref 70–99)
Glucose-Capillary: 112 mg/dL — ABNORMAL HIGH (ref 70–99)
Glucose-Capillary: 134 mg/dL — ABNORMAL HIGH (ref 70–99)

## 2019-11-05 LAB — BASIC METABOLIC PANEL
Anion gap: 10 (ref 5–15)
BUN: 29 mg/dL — ABNORMAL HIGH (ref 8–23)
CO2: 24 mmol/L (ref 22–32)
Calcium: 9.4 mg/dL (ref 8.9–10.3)
Chloride: 101 mmol/L (ref 98–111)
Creatinine, Ser: 1.61 mg/dL — ABNORMAL HIGH (ref 0.61–1.24)
GFR calc Af Amer: 45 mL/min — ABNORMAL LOW (ref >60)
GFR calc non Af Amer: 38 mL/min — ABNORMAL LOW (ref >60)
Glucose, Bld: 154 mg/dL — ABNORMAL HIGH (ref 70–99)
Potassium: 4.4 mmol/L (ref 3.5–5.1)
Sodium: 135 mmol/L (ref 135–145)

## 2019-11-05 LAB — PREPARE RBC (CROSSMATCH)

## 2019-11-05 LAB — SARS CORONAVIRUS 2 (TAT 6-24 HRS): SARS Coronavirus 2: NEGATIVE

## 2019-11-05 LAB — ABO/RH: ABO/RH(D): AB POS

## 2019-11-05 MED ORDER — DULOXETINE HCL 30 MG PO CPEP
60.0000 mg | ORAL_CAPSULE | Freq: Every day | ORAL | Status: DC
  Administered 2019-11-05 – 2019-11-17 (×12): 60 mg via ORAL
  Filled 2019-11-05 (×12): qty 2

## 2019-11-05 MED ORDER — ATORVASTATIN CALCIUM 20 MG PO TABS
80.0000 mg | ORAL_TABLET | Freq: Every day | ORAL | Status: DC
  Administered 2019-11-05 – 2019-11-16 (×11): 80 mg via ORAL
  Filled 2019-11-05 (×12): qty 4

## 2019-11-05 MED ORDER — CHLORHEXIDINE GLUCONATE CLOTH 2 % EX PADS
6.0000 | MEDICATED_PAD | Freq: Every day | CUTANEOUS | Status: DC
  Administered 2019-11-05 – 2019-11-08 (×4): 6 via TOPICAL

## 2019-11-05 MED ORDER — CLOPIDOGREL BISULFATE 75 MG PO TABS: 75.0000 mg | ORAL_TABLET | Freq: Every day | ORAL | Status: DC

## 2019-11-05 MED ORDER — APIXABAN 5 MG PO TABS: 5.0000 mg | ORAL_TABLET | Freq: Two times a day (BID) | ORAL | Status: DC

## 2019-11-05 MED ORDER — CHLORHEXIDINE GLUCONATE CLOTH 2 % EX PADS
6.0000 | MEDICATED_PAD | Freq: Every day | CUTANEOUS | Status: DC
  Administered 2019-11-05 – 2019-11-16 (×9): 6 via TOPICAL

## 2019-11-05 MED ORDER — SODIUM CHLORIDE 0.9 % IV SOLN: Freq: Once | INTRAVENOUS | Status: AC

## 2019-11-05 MED ORDER — APIXABAN 2.5 MG PO TABS
2.5000 mg | ORAL_TABLET | Freq: Two times a day (BID) | ORAL | Status: DC
  Filled 2019-11-05: qty 1

## 2019-11-05 MED ORDER — INSULIN ASPART 100 UNIT/ML ~~LOC~~ SOLN
0.0000 [IU] | Freq: Three times a day (TID) | SUBCUTANEOUS | Status: DC
  Administered 2019-11-05 – 2019-11-06 (×3): 2 [IU] via SUBCUTANEOUS
  Administered 2019-11-06: 3 [IU] via SUBCUTANEOUS
  Administered 2019-11-07 – 2019-11-08 (×2): 2 [IU] via SUBCUTANEOUS
  Administered 2019-11-08 (×2): 3 [IU] via SUBCUTANEOUS
  Administered 2019-11-09: 2 [IU] via SUBCUTANEOUS
  Administered 2019-11-09 – 2019-11-10 (×2): 3 [IU] via SUBCUTANEOUS
  Administered 2019-11-10 – 2019-11-11 (×4): 2 [IU] via SUBCUTANEOUS
  Administered 2019-11-11: 18:00:00 3 [IU] via SUBCUTANEOUS
  Administered 2019-11-12: 8 [IU] via SUBCUTANEOUS
  Administered 2019-11-12: 3 [IU] via SUBCUTANEOUS
  Administered 2019-11-12: 13:00:00 2 [IU] via SUBCUTANEOUS
  Administered 2019-11-13: 3 [IU] via SUBCUTANEOUS
  Administered 2019-11-13: 5 [IU] via SUBCUTANEOUS
  Administered 2019-11-13: 8 [IU] via SUBCUTANEOUS
  Filled 2019-11-05 (×23): qty 1

## 2019-11-05 MED ORDER — BELLADONNA ALKALOIDS-OPIUM 16.2-60 MG RE SUPP: 1.0000 | Freq: Three times a day (TID) | RECTAL | Status: DC | PRN

## 2019-11-05 MED ORDER — BELLADONNA ALKALOIDS-OPIUM 16.2-60 MG RE SUPP
1.0000 | Freq: Once | RECTAL | Status: AC
  Administered 2019-11-05: 1 via RECTAL
  Filled 2019-11-05: qty 1

## 2019-11-05 MED ORDER — CARVEDILOL 25 MG PO TABS
25.0000 mg | ORAL_TABLET | Freq: Two times a day (BID) | ORAL | Status: DC
  Administered 2019-11-05 – 2019-11-17 (×22): 25 mg via ORAL
  Filled 2019-11-05 (×23): qty 1

## 2019-11-05 NOTE — Progress Notes (Signed)
PROGRESS NOTE  [name] ZYS:[id number] DOB: 05-Feb-1932 DOA: 11/04/2019 PCP: [name], PA-C  HPI/Recap of past 24 hours: Patient is an 86 year old male with past medical history of CAD status post stent, chronic diastolic heart failure, sleep apnea, diabetes mellitus, stage III chronic kidney disease and recent DVT diagnosed 3 months ago placed on Eliquis who presented with hematuria.  Per his assisted living facility, patient had been reportedly having hematuria for the past 5 days.  In the emergency room, patient was noted to have a white count of 11.9 and a hemoglobin of 8.3, down from 9.52 weeks prior.  He was noted to have gross hematuria with clots on bladder scan noted a third of a liter.  Patient was started on continuous bladder irrigation and urology was consulted.  Urology was able to irrigate the bladder with about a liter of normal saline and then the patient was set up for continuous bladder irrigation.  He was also transfused 1 unit packed red blood cells.  His Eliquis was held.  Following transfusion, patient's hemoglobin improved to 8.6, and by this afternoon, down to 7.9.  In discussion with urology, they suspect that he has metastatic prostate cancer.  Work-up in progress.  Interventional radiology consulted for placement of IVC filter.  Assessment/Plan: Principal Problem:   Hematuria/acute blood loss anemia in patient with BPH on anticoagulation: Anticoagulation discontinued.  Being followed by urology.  Work-up for suspected metastatic prostate cancer initiated. Active Problems:   CAD (coronary artery disease) stable.    Insulin dependent type 2 diabetes mellitus ([place]): On sliding scale.    Essential hypertension: Blood pressures have been stable to slightly on the high end.  Continue home medications.     CKD (chronic kidney disease) stage 3, GFR 30-59 ml/min: Stable.,  At baseline.    Chronic diastolic CHF (congestive heart failure) [facility]): Monitor closely.   Given blood and IVC fluid, likely could become hypervolemic easily.  Diurese as appropriate.    Elevated LFTs    OSA on CPAP: Nightly CPAP.   Dyslipidemia: Continue statin.    Morbid obesity ([place]): Meets criteria for BMI greater than 35+ diabetes and hypertension and heart failure.    DVT (deep venous thrombosis) ([place]): Diagnosed 3 months ago.  Would likely benefit from IVC filter placement, interventional radiology consulted.   Code Status: Full code  Family Communication: Updated daughter by phone  Disposition Plan: Home once hematuria stabilized, IVC filter placed and work-up for possible prostate cancer complete   Consultants:  Urology  Interventional radiology  Procedures:  Bladder irrigation done 1/15  Antimicrobials:  None  DVT prophylaxis: SCDs   Objective: Vitals:   11/05/19 0827 11/05/19 1215  BP: 139/64 (!) 142/61  Pulse: 75 68  Resp:  18  Temp:  98.1 F (36.7 C)  SpO2: 100% 100%    Intake/Output Summary (Last 24 hours) at 11/05/2019 1526 Last data filed at 11/05/2019 1524 Gross per 24 hour  Intake 27000 ml  Output 37900 ml  Net -10900 ml   Filed Weights   11/04/19 1241 11/05/19 0135  Weight: 123.4 kg 115.4 kg   Body mass index is 35.48 kg/m.  Exam:   General: Drowsy, oriented x2, no acute distress  HEENT: Normocephalic and atraumatic, mucous membranes slightly dry  Neck: Thick, narrow airway  Cardiovascular: Regular rate and rhythm, S1-S2  Respiratory: Clear to auscultation bilaterally  Abdomen: Soft, mild distended, nontender, hypoactive bowel sounds  Musculoskeletal: No clubbing or cyanosis, trace pitting edema bilaterally  Skin:  No skin breaks, tears or lesions  Psychiatry: Appropriate, no evidence of psychoses   Data Reviewed: CBC: Recent Labs  Lab 11/04/19 1248 11/05/19 0020 11/05/19 0623 11/05/19 1500  WBC 11.9*  --   --   --   NEUTROABS 9.8*  --   --   --   HGB 8.3* 7.9* 8.6* 7.9*  HCT 26.1* 25.2* 27.0*  24.1*  MCV 85.9  --   --   --   PLT 262  --   --   --    Basic Metabolic Panel: Recent Labs  Lab 11/04/19 1248 11/05/19 0623  NA 134* 135  K 4.6 4.4  CL 100 101  CO2 24 24  GLUCOSE 136* 154*  BUN 27* 29*  CREATININE 1.98* 1.61*  CALCIUM 9.3 9.4   GFR: Estimated Creatinine Clearance: 43.3 mL/min (A) (by C-G formula based on SCr of 1.61 mg/dL (H)). Liver Function Tests: Recent Labs  Lab 11/04/19 1248  AST 43*  ALT 45*  ALKPHOS 159*  BILITOT 1.1  PROT 7.0  ALBUMIN 3.2*   Recent Labs  Lab 11/04/19 1248  LIPASE 20   No results for input(s): AMMONIA in the last 168 hours. Coagulation Profile: Recent Labs  Lab 11/04/19 1527  INR 2.0*   Cardiac Enzymes: No results for input(s): CKTOTAL, CKMB, CKMBINDEX, TROPONINI in the last 168 hours. BNP (last 3 results) No results for input(s): PROBNP in the last 8760 hours. HbA1C: No results for input(s): HGBA1C in the last 72 hours. CBG: Recent Labs  Lab 11/05/19 0737 11/05/19 1214  GLUCAP 138* 132*   Lipid Profile: No results for input(s): CHOL, HDL, LDLCALC, TRIG, CHOLHDL, LDLDIRECT in the last 72 hours. Thyroid Function Tests: No results for input(s): TSH, T4TOTAL, FREET4, T3FREE, THYROIDAB in the last 72 hours. Anemia Panel: No results for input(s): VITAMINB12, FOLATE, FERRITIN, TIBC, IRON, RETICCTPCT in the last 72 hours. Urine analysis:    Component Value Date/Time   COLORURINE RED (A) 11/04/2019 1552   APPEARANCEUR TURBID (A) 11/04/2019 1552   LABSPEC 1.025 11/04/2019 1552   PHURINE  11/04/2019 1552    TEST NOT REPORTED DUE TO COLOR INTERFERENCE OF URINE PIGMENT   GLUCOSEU (A) 11/04/2019 1552    TEST NOT REPORTED DUE TO COLOR INTERFERENCE OF URINE PIGMENT   HGBUR (A) 11/04/2019 1552    TEST NOT REPORTED DUE TO COLOR INTERFERENCE OF URINE PIGMENT   BILIRUBINUR (A) 11/04/2019 1552    TEST NOT REPORTED DUE TO COLOR INTERFERENCE OF URINE PIGMENT   KETONESUR (A) 11/04/2019 1552    TEST NOT REPORTED DUE TO  COLOR INTERFERENCE OF URINE PIGMENT   PROTEINUR (A) 11/04/2019 1552    TEST NOT REPORTED DUE TO COLOR INTERFERENCE OF URINE PIGMENT   NITRITE (A) 11/04/2019 1552    TEST NOT REPORTED DUE TO COLOR INTERFERENCE OF URINE PIGMENT   LEUKOCYTESUR (A) 11/04/2019 1552    TEST NOT REPORTED DUE TO COLOR INTERFERENCE OF URINE PIGMENT   Sepsis Labs: @LABRCNTIP (procalcitonin:4,lacticidven:4)  ) Recent Results (from the past 240 hour(s))  Urine culture     Status: Abnormal   Collection Time: 11/04/19  3:52 PM   Specimen: Urine, Random  Result Value Ref Range Status   Specimen Description   Final    URINE, RANDOM Performed at [facility], [address], [place], [place] [address]    Special Requests   Final    NONE Performed at [facility], [address], [place], [place] [address]    Culture (A)  Final    <10,000 COLONIES/mL INSIGNIFICANT GROWTH Performed at [facility] [address], [place], [place] [address]    Report Status 11/05/2019 FINAL  Final  SARS CORONAVIRUS 2 (TAT 6-24 HRS) Nasopharyngeal Nasopharyngeal Swab     Status: None   Collection Time: 11/04/19  8:06 PM   Specimen: Nasopharyngeal Swab  Result Value Ref Range Status   SARS Coronavirus 2 NEGATIVE NEGATIVE Final    Comment: (NOTE) SARS-CoV-2 target nucleic acids are NOT DETECTED. The SARS-CoV-2 RNA is generally detectable in upper and lower respiratory specimens during the acute phase of infection. Negative results do not preclude SARS-CoV-2 infection, do not rule out co-infections with other pathogens, and should not be used as the sole basis for treatment or other patient management decisions. Negative results must be combined with clinical observations, patient history, and epidemiological information. The expected result is Negative. Fact Sheet for Patients: SugarRoll.be Fact Sheet for Healthcare  Providers: https://www.woods-mathews.com/ This test is not yet approved or cleared by the [place] FDA and  has been authorized for detection and/or diagnosis of SARS-CoV-2 by FDA under an Emergency Use Authorization (EUA). This EUA will remain  in effect (meaning this test can be used) for the duration of the COVID-19 declaration under Section 56 4(b)(1) of the Act, 21 U.S.C. section 360bbb-3(b)(1), unless the authorization is terminated or revoked sooner. Performed at [facility], [place] [address] [place],  [address]       Studies: CT RENAL STONE STUDY  Result Date: 11/05/2019 CLINICAL DATA:  Hematuria of indeterminate etiology. EXAM: CT ABDOMEN AND PELVIS WITHOUT CONTRAST TECHNIQUE: Multidetector CT imaging of the abdomen and pelvis was performed following the standard protocol without IV contrast. COMPARISON:  None available FINDINGS: Lower chest: Extensive coronary calcifications. Small right pleural effusion with atelectasis/consolidation in the adjacent right lower lobe. Possible 1.5 cm subpleural nodule, lateral basal segment right lower lobe. Nodules about the right diaphragmatic leaflet. Hepatobiliary: No liver lesion or biliary ductal dilatation. Gallbladder nondilated. Pancreas: Unremarkable. No pancreatic ductal dilatation or surrounding inflammatory changes. Spleen: Normal in size without focal abnormality. Probable accessory splenule. Adrenals/Urinary Tract: Adrenal glands unremarkable. Mild left hydronephrosis and ureterectasis down to the level of pelvic mass. 6.5 cm exophytic fluid attenuation lesion from the mid right kidney possibly cyst but incompletely characterized. Similar smaller 1.7 cm nonspecific medial right renal lesion. Urinary bladder partially decompressed by Foley catheter. There is a small amount of gas in the lumen. There is high density material in the lumen of the bladder suggesting clot. Prostate hypertrophy/mass protrudes into the  lumen of the urinary bladder. Stomach/Bowel: Stomach is decompressed. Small bowel is nondistended. Moderate fecal material in the proximal colon. There is gaseous distention of the redundant sigmoid colon. This distal sigmoid colon is decompressed and surrounded by multiple retroperitoneal nodules. Rectum decompressed, unremarkable. Vascular/Lymphatic: Aortoiliac atherosclerosis (ICD10-170.0) without aneurysm. Bulky left external and common iliac and perirectal adenopathy. Subcentimeter left para-aortic and aortocaval nodes. Bulky confluent nodules/adenopathy extends from prostate along the distal sigmoid colon, with scattered nodules in the sigmoid mesocolon. Reproductive: Bulky prostate enlargement with adjacent confluent masses/aadenopathy as above. Other: Peritoneal nodules in the right upper quadrant, confluent over the dome of the liver. 1.6 cm nodule in the right pericolic gutter. Musculoskeletal: 2 cm lytic lesion in the right iliac wing. Heterogenous appearance of lower thoracic and lumbar vertebral bodies and sacrum, with probable pathologic compression deformity L1 vertebral body. IMPRESSION: 1. Suspected metastatic prostate carcinoma with confluent nodular masses adjacent to the  prostate and distal sigmoid colon with left pelvic, perirectal, retroperitoneal, and mesenteric adenopathy as well as probable peritoneal disease most marked in the right upper quadrant extending about the right diaphragmatic leaflet. 2. Pelvic and suspected vertebral osseous metastatic disease with L1 compression fracture deformity, likely pathologic. 3. Possible 2 cm right lower lobe lung nodule. 4. Left hydronephrosis and proximal ureterectasis secondary to pelvic process. Electronically Signed   By: Lucrezia Europe M.D.   On: 11/05/2019 13:20    Scheduled Meds: . atorvastatin  80 mg Oral Daily  . carvedilol  25 mg Oral BID WC  . Chlorhexidine Gluconate Cloth  6 each Topical Daily  . Chlorhexidine Gluconate Cloth  6 each  Topical Daily  . DULoxetine  60 mg Oral Daily  . finasteride  5 mg Oral Daily  . insulin aspart  0-15 Units Subcutaneous TID WC    Continuous Infusions: . sodium chloride irrigation Stopped (11/05/19 0035)     LOS: 1 day     Annita Brod, MD [facility]  To reach me or the doctor on call, go to: [email or web address] Password [email or web address]  11/05/2019, 3:26 PM

## 2019-11-05 NOTE — H&P (Signed)
History and Physical    [name]:[id number] DOB: 03-16-1932 DOA: 11/04/2019  PCP: Brandon Coop, PA-C  Patient coming from: [facility]  I have personally briefly reviewed patient's old medical records in [place]  Chief Complaint: Hematuria  HPI: [name] is a 86 y.o. male with medical history significant of CAD s/p stent, hypertension, chronic diastolic heart failure, venous insufficiency, OSA, type 2 diabetes, CKD stage III, BPH, history of DVT on Eliquis who presents with concerns of hematuria. Patient overall is a poor historian and unable to give me much details on his symptoms.  Reports that he has been having hematuria for 5 days but per nursing report his facility has noted hematuria for the past 2 days. Unsure if he took his Eliquis.  Only mentions that his abdomen has felt more distended.  ED Course: He was afebrile and intermittently hypertensive up to 160s over 70s on room air. WBC of 11.9, hemoglobin of 8.3 down from 9.5 two weeks ago. Sodium of 134, glucose of 136, creatinine of 1.98 down from 2.05, AST of 43, ALT of 45.  INR of 2.  Elevated PT 22.4.  Patient was found to have gross hematuria with clots and bladder scan showed 331 millliters.  Continuous bladder irrigation was started.  ED physician discussed case with urologist Dr. Gloriann Loan who will evaluate the patient.   Review of Systems:  Unable to fully obtain as patient was very poor historian and unable to describe the symptoms Past Medical History:  Diagnosis Date  . Anemia, normocytic normochromic   . BPH (benign prostatic hyperplasia)   . CAD (coronary artery disease)   . Cataracts, bilateral   . Chronic renal insufficiency   . Colon polyp   . COPD (chronic obstructive pulmonary disease) ([place])   . Diabetes mellitus without complication ([place])   . DVT (deep venous thrombosis) ([place])   . Edema of both legs   . Glaucoma   . Gout   . Heart murmur   . Hepatitis  C antibody test positive 10/20/2019  . Hyperlipidemia   . Hypertension   . Moderate persistent asthma without complication   . Myocardial infarction ([place])   . Neuromuscular disorder ([place])   . Neuropathy   . Peripheral vascular disease ([place])   . Premature ejaculation   . Prostate cancer ([place])   . RLS (restless legs syndrome)   . Sleep apnea   . Stented coronary artery 11/13/2015  . Uncontrolled type 2 diabetes mellitus with stage 3 chronic kidney disease, with long-term current use of insulin ([place])     Past Surgical History:  Procedure Laterality Date  . APPENDECTOMY    . [place]  2009  . COLONOSCOPY  2012  . EYE SURGERY    . PROSTATE SURGERY    . TONSILLECTOMY       reports that he has quit smoking. He has never used smokeless tobacco. No history on file for alcohol and drug.  Allergies  Allergen Reactions  . Metformin And Related Diarrhea    Intolerance due to naturally loose bowels    Unable to obtain family hx given pt poor historian  Prior to Admission medications   Medication Sig Start Date End Date Taking? Authorizing Provider  aspirin 81 MG chewable tablet Chew 81 mg by mouth daily.  07/15/19  Yes [provider]  atorvastatin (LIPITOR) 80 MG tablet Take 80 mg by mouth daily. 11/15/15  Yes [provider]  carvedilol (COREG) 25 MG  tablet Take 25 mg by mouth 2 (two) times daily with a meal.   Yes [provider]  Cholecalciferol (VITAMIN D) 50 MCG (2000 UT) tablet Take 2,000 Units by mouth daily.    Yes [provider]  clopidogrel (PLAVIX) 75 MG tablet Take 75 mg by mouth daily. 11/12/16  Yes [provider]  DULoxetine (CYMBALTA) 60 MG capsule Take 60 mg by mouth daily.   Yes [provider]  ELIQUIS 5 MG TABS tablet Take 5 mg by mouth every 12 (twelve) hours.  10/03/19  Yes [provider]  famotidine (PEPCID) 20 MG tablet Take 20 mg by mouth at bedtime as needed for heartburn.  08/26/19  08/25/20 Yes [provider]  finasteride (PROSCAR) 5 MG tablet Take 5 mg by mouth daily. 05/22/14  Yes [provider]  insulin aspart (NOVOLOG) 100 UNIT/ML FlexPen Inject 12 Units into the skin 3 (three) times daily with meals.    Yes [provider]  Insulin Detemir (LEVEMIR) 100 UNIT/ML Pen Inject 30 Units into the skin 2 (two) times daily. 10/20/19  Yes Samuella Cota, MD  Multiple Vitamin (MULTI-VITAMIN) tablet Take 1 tablet by mouth daily.   Yes [provider]  nitroGLYCERIN (NITROSTAT) 0.4 MG SL tablet Place 0.4 mg under the tongue every 5 (five) minutes as needed for chest pain.    Yes [provider]  torsemide (DEMADEX) 20 MG tablet Take 2 tablets (40 mg total) by mouth daily. Patient not taking: Reported on 11/04/2019 10/21/19   Samuella Cota, MD    Physical Exam: Vitals:   11/04/19 2100 11/04/19 2130 11/04/19 2200 11/04/19 2230  BP: 137/70 127/67 (!) 166/73 137/69  Pulse:    68  Resp:    16  Temp:      TempSrc:      SpO2:    98%  Weight:      Height:        Constitutional: NAD, calm, comfortable laying flat in bed asleep Vitals:   11/04/19 2100 11/04/19 2130 11/04/19 2200 11/04/19 2230  BP: 137/70 127/67 (!) 166/73 137/69  Pulse:    68  Resp:    16  Temp:      TempSrc:      SpO2:    98%  Weight:      Height:       Eyes: PERRL, lids and conjunctivae normal ENMT: Mucous membranes are moist. Neck: normal, supple Respiratory: clear to auscultation bilaterally, no wheezing, no crackles. Normal respiratory effort. No accessory muscle use.  Cardiovascular: Regular rate and rhythm, no murmurs / rubs / gallops. No extremity edema. 2+ pedal pulses. No carotid bruits.  Abdomen:  Lower abdominal Tenderness and moderate distention. Bowel sounds positive.  Musculoskeletal: no clubbing / cyanosis. No joint deformity upper and lower extremities. Good ROM, no contractures. Normal muscle tone.  Skin: no rashes, lesions, ulcers. No  induration Neurologic: CN 2-12 grossly intact. Delay and slow speech. Sensation intact. Strength 5/5 in all 4.  Psychiatric: Normal judgment and insight. Alert but somewhat disoriented. Normal mood.     Labs on Admission: I have personally reviewed following labs and imaging studies  CBC: Recent Labs  Lab 11/04/19 1248  WBC 11.9*  NEUTROABS 9.8*  HGB 8.3*  HCT 26.1*  MCV 85.9  PLT 580   Basic Metabolic Panel: Recent Labs  Lab 11/04/19 1248  NA 134*  K 4.6  CL 100  CO2 24  GLUCOSE 136*  BUN 27*  CREATININE 1.98*  CALCIUM 9.3   GFR: Estimated Creatinine Clearance: 36.5 mL/min (A) (by C-G formula based on SCr of 1.98 mg/dL (H)). Liver Function Tests: Recent Labs  Lab 11/04/19 1248  AST 43*  ALT 45*  ALKPHOS 159*  BILITOT 1.1  PROT 7.0  ALBUMIN 3.2*   Recent Labs  Lab 11/04/19 1248  LIPASE 20   No results for input(s): AMMONIA in the last 168 hours. Coagulation Profile: Recent Labs  Lab 11/04/19 1527  INR 2.0*   Cardiac Enzymes: No results for input(s): CKTOTAL, CKMB, CKMBINDEX, TROPONINI in the last 168 hours. BNP (last 3 results) No results for input(s): PROBNP in the last 8760 hours. HbA1C: No results for input(s): HGBA1C in the last 72 hours. CBG: No results for input(s): GLUCAP in the last 168 hours. Lipid Profile: No results for input(s): CHOL, HDL, LDLCALC, TRIG, CHOLHDL, LDLDIRECT in the last 72 hours. Thyroid Function Tests: No results for input(s): TSH, T4TOTAL, FREET4, T3FREE, THYROIDAB in the last 72 hours. Anemia Panel: No results for input(s): VITAMINB12, FOLATE, FERRITIN, TIBC, IRON, RETICCTPCT in the last 72 hours. Urine analysis:    Component Value Date/Time   COLORURINE RED (A) 11/04/2019 1552   APPEARANCEUR TURBID (A) 11/04/2019 1552   LABSPEC 1.025 11/04/2019 1552   PHURINE  11/04/2019 1552    TEST NOT REPORTED DUE TO COLOR INTERFERENCE OF URINE PIGMENT   GLUCOSEU (A) 11/04/2019 1552    TEST NOT REPORTED DUE TO COLOR  INTERFERENCE OF URINE PIGMENT   HGBUR (A) 11/04/2019 1552    TEST NOT REPORTED DUE TO COLOR INTERFERENCE OF URINE PIGMENT   BILIRUBINUR (A) 11/04/2019 1552    TEST NOT REPORTED DUE TO COLOR INTERFERENCE OF URINE PIGMENT   KETONESUR (A) 11/04/2019 1552    TEST NOT REPORTED DUE TO COLOR INTERFERENCE OF URINE PIGMENT   PROTEINUR (A) 11/04/2019 1552    TEST NOT REPORTED DUE TO COLOR INTERFERENCE OF URINE PIGMENT   NITRITE (A) 11/04/2019 1552    TEST NOT REPORTED DUE TO COLOR INTERFERENCE OF URINE PIGMENT   LEUKOCYTESUR (A) 11/04/2019 1552    TEST NOT REPORTED DUE TO COLOR INTERFERENCE OF URINE PIGMENT    Radiological Exams on Admission: No results found.  EKG: Independently reviewed.   Assessment/Plan  Hematuria  Urology consulted. Appreciate assistance.  Continue continuous bladder irrigation Continue finasteride 5 mg daily per urology Consider CT abdomen and pelvis if bleeding continues  Acute blood loss anemia Transfusion threshold of less than 8 given history of CAD s/p stent Repeat Hbg of 7.9- will transfuse given cardiac hx and active bleed  Mildly elevated LFTS Elevated LFT noted in last admission in Dec with hepatitis C antibody detect- need to follow up outpatient continue to monitor  Elevated INR/PT Unclear if due to previous found hepatitis C antibody  CAD s/p stent Stable holding aspirin and plavix due to acute bleed  Chronic kidney disease stage III stable avoid nephrotoxic agent  Type 2 diabetes normally on 20 units levermir BID, Novolog 12 units TID with meals BG of 136 on admit. Start moderate SSI  OSA CPAP  History of DVT Hold Eliquis  HLD continue statin  DVT prophylaxis: SCD Code Status: Full Family Communication: No family at bedside disposition Plan: Home with at least 2 midnight stays  Consults called: Urology Admission status: inpatient    Celita Aron T Brette Cast DO [facility]   If 7PM-7AM, please contact  night-coverage [email or web address] Password [email or web address]  11/05/2019, 12:20 AM

## 2019-11-05 NOTE — Progress Notes (Signed)
Patient declined CPAP at this time. Patient advised to call RT if he changes his mind and one will be provided. RN and NT at bedside. SAT on Room Air 98%

## 2019-11-05 NOTE — ED Notes (Signed)
Report finished att 

## 2019-11-05 NOTE — Progress Notes (Signed)
Urology Inpatient Progress Report  Anticoagulated [Z79.01] Hematuria [R31.9] Hematuria, unspecified type [R31.9]        Intv/Subj: Hemoglobin dropped to 7.8 earlier this morning and he received 1 unit PRBC.  He responded appropriately with a hemoglobin of 8.6.  He continues on continuous bladder irrigation.  Nursing has had to irrigate a small amount with only tiny clot return.  Urine is light red on moderate drip CBI.  He is having bladder spasms.  Principal Problem:   Hematuria Active Problems:   CAD (coronary artery disease)   Insulin dependent type 2 diabetes mellitus (HCC)   Essential hypertension   Chronic anticoagulation   CKD (chronic kidney disease) stage 3, GFR 30-59 ml/min   Elevated LFTs   Elevated INR   Acute blood loss anemia   OSA on CPAP   Dyslipidemia  Current Facility-Administered Medications  Medication Dose Route Frequency Provider Last Rate Last Admin  . atorvastatin (LIPITOR) tablet 80 mg  80 mg Oral Daily Tu, Ching T, DO      . carvedilol (COREG) tablet 25 mg  25 mg Oral BID WC Tu, Ching T, DO   25 mg at 11/05/19 0829  . Chlorhexidine Gluconate Cloth 2 % PADS 6 each  6 each Topical Daily Tu, Ching T, DO      . Chlorhexidine Gluconate Cloth 2 % PADS 6 each  6 each Topical Daily Tu, Ching T, DO      . DULoxetine (CYMBALTA) DR capsule 60 mg  60 mg Oral Daily Tu, Ching T, DO   60 mg at 11/05/19 0829  . finasteride (PROSCAR) tablet 5 mg  5 mg Oral Daily Tu, Ching T, DO   5 mg at 11/05/19 0829  . insulin aspart (novoLOG) injection 0-15 Units  0-15 Units Subcutaneous TID WC Tu, Ching T, DO   2 Units at 11/05/19 0825  . opium-belladonna (B&O SUPPRETTES) 16.2-60 MG suppository 1 suppository  1 suppository Rectal Once Marton Redwood III, MD      . opium-belladonna (B&O SUPPRETTES) 16.2-60 MG suppository 1 suppository  1 suppository Rectal Q8H PRN Marton Redwood III, MD      . sodium chloride irrigation 0.9 % 3,000 mL  3,000 mL Irrigation Continuous Tu, Ching T, DO    Stopped at 11/05/19 0035     Objective: Vital: Vitals:   11/05/19 0207 11/05/19 0445 11/05/19 0552 11/05/19 0827  BP: (!) 161/65 (!) 161/68 (!) 144/68 139/64  Pulse: 72 69 (!) 43 75  Resp: 20 20 20    Temp: 97.7 F (36.5 C) 97.6 F (36.4 C) 98.2 F (36.8 C)   TempSrc: Oral Oral Oral   SpO2: 100% 100% 97% 100%  Weight:      Height:       I/Os: I/O last 3 completed shifts: In: 12000 [Other:12000] Out: 16100 [HALPF:79024; Other:4250]  Physical Exam:  General: Patient is in no apparent distress Lungs: Normal respiratory effort, chest expands symmetrically. GI:  The abdomen is soft and nontender without mass. Foley: Circumcised phallus with 20 French three-way Foley catheter in place.  He is having some bladder spasms.  I irrigated the bladder with only small clot return.  CBI on moderate drip yielded light pink urine at that point. Ext: lower extremities symmetric  Lab Results: Recent Labs    11/04/19 1248 11/05/19 0020 11/05/19 0623  WBC 11.9*  --   --   HGB 8.3* 7.9* 8.6*  HCT 26.1* 25.2* 27.0*   Recent Labs    11/04/19 1248  11/05/19 0623  NA 134* 135  K 4.6 4.4  CL 100 101  CO2 24 24  GLUCOSE 136* 154*  BUN 27* 29*  CREATININE 1.98* 1.61*  CALCIUM 9.3 9.4   Recent Labs    11/04/19 1527  INR 2.0*   No results for input(s): LABURIN in the last 72 hours. Results for orders placed or performed during the hospital encounter of 11/04/19  SARS CORONAVIRUS 2 (TAT 6-24 HRS) Nasopharyngeal Nasopharyngeal Swab     Status: None   Collection Time: 11/04/19  8:06 PM   Specimen: Nasopharyngeal Swab  Result Value Ref Range Status   SARS Coronavirus 2 NEGATIVE NEGATIVE Final    Comment: (NOTE) SARS-CoV-2 target nucleic acids are NOT DETECTED. The SARS-CoV-2 RNA is generally detectable in upper and lower respiratory specimens during the acute phase of infection. Negative results do not preclude SARS-CoV-2 infection, do not rule out co-infections with other  pathogens, and should not be used as the sole basis for treatment or other patient management decisions. Negative results must be combined with clinical observations, patient history, and epidemiological information. The expected result is Negative. Fact Sheet for Patients: SugarRoll.be Fact Sheet for Healthcare Providers: https://www.woods-mathews.com/ This test is not yet approved or cleared by the [place] FDA and  has been authorized for detection and/or diagnosis of SARS-CoV-2 by FDA under an Emergency Use Authorization (EUA). This EUA will remain  in effect (meaning this test can be used) for the duration of the COVID-19 declaration under Section 56 4(b)(1) of the Act, 21 U.S.C. section 360bbb-3(b)(1), unless the authorization is terminated or revoked sooner. Performed at [facility], [place] [address] [place], [place] [address]     Studies/Results: No results found.  Assessment: Gross hematuria Acute blood loss anemia Bladder spasms  Plan: Obtain H&H this afternoon.  I put that order in.  Transfuse as necessary CBC and BMP in the morning.  I put that order in. N.p.o. at midnight.  That order is in.  Continue to hold at least Xarelto and Plavix.  As long as he is stable, I would prefer to hold off on any surgical intervention until blood thinners are more out of his system given the risk of bleeding with a transurethral surgery and the possibility of bladder tumor versus needing a TURP to decrease bleeding.  This could potentially increase bleeding if the surgery is performed with blood thinner still in his system.  Hopefully urine will start clearing more as blood thinner clears his system.  For now, continue continuous bladder irrigation and wean as able.  Continue finasteride.  I will obtain a CT of the abdomen and pelvis without contrast given his renal function.  This may give [place] some more information.  I ordered  belladonna/opium suppository to help with his bladder spasms.    Link Snuffer, MD Urology 11/05/2019, 11:13 AM

## 2019-11-06 DIAGNOSIS — N183 Chronic kidney disease, stage 3 unspecified: Secondary | ICD-10-CM

## 2019-11-06 DIAGNOSIS — R310 Gross hematuria: Secondary | ICD-10-CM

## 2019-11-06 DIAGNOSIS — Z7901 Long term (current) use of anticoagulants: Secondary | ICD-10-CM

## 2019-11-06 DIAGNOSIS — D62 Acute posthemorrhagic anemia: Secondary | ICD-10-CM

## 2019-11-06 LAB — CBC
HCT: 24 % — ABNORMAL LOW (ref 39.0–52.0)
Hemoglobin: 7.7 g/dL — ABNORMAL LOW (ref 13.0–17.0)
MCH: 27.8 pg (ref 26.0–34.0)
MCHC: 32.1 g/dL (ref 30.0–36.0)
MCV: 86.6 fL (ref 80.0–100.0)
Platelets: 247 10*3/uL (ref 150–400)
RBC: 2.77 MIL/uL — ABNORMAL LOW (ref 4.22–5.81)
RDW: 14.9 % (ref 11.5–15.5)
WBC: 12.1 10*3/uL — ABNORMAL HIGH (ref 4.0–10.5)
nRBC: 0 % (ref 0.0–0.2)

## 2019-11-06 LAB — URINALYSIS, COMPLETE (UACMP) WITH MICROSCOPIC
RBC / HPF: >50 RBC/hpf — ABNORMAL HIGH (ref 0–5)
Specific Gravity, Urine: 1.005 (ref 1.005–1.030)
Squamous Epithelial / HPF: NONE SEEN (ref 0–5)

## 2019-11-06 LAB — GLUCOSE, CAPILLARY
Glucose-Capillary: 123 mg/dL — ABNORMAL HIGH (ref 70–99)
Glucose-Capillary: 113 mg/dL — ABNORMAL HIGH (ref 70–99)
Glucose-Capillary: 197 mg/dL — ABNORMAL HIGH (ref 70–99)
Glucose-Capillary: 169 mg/dL — ABNORMAL HIGH (ref 70–99)

## 2019-11-06 LAB — BPAM RBC
Blood Product Expiration Date: 202102112359
ISSUE DATE / TIME: 202101160139
Unit Type and Rh: 6200

## 2019-11-06 LAB — TYPE AND SCREEN
ABO/RH(D): AB POS
Antibody Screen: NEGATIVE
Unit division: 0

## 2019-11-06 LAB — BASIC METABOLIC PANEL
Anion gap: 9 (ref 5–15)
BUN: 27 mg/dL — ABNORMAL HIGH (ref 8–23)
CO2: 24 mmol/L (ref 22–32)
Calcium: 9.2 mg/dL (ref 8.9–10.3)
Chloride: 104 mmol/L (ref 98–111)
Creatinine, Ser: 1.62 mg/dL — ABNORMAL HIGH (ref 0.61–1.24)
GFR calc Af Amer: 44 mL/min — ABNORMAL LOW (ref >60)
GFR calc non Af Amer: 38 mL/min — ABNORMAL LOW (ref >60)
Glucose, Bld: 128 mg/dL — ABNORMAL HIGH (ref 70–99)
Potassium: 4.6 mmol/L (ref 3.5–5.1)
Sodium: 137 mmol/L (ref 135–145)

## 2019-11-06 LAB — PSA: Prostatic Specific Antigen: 2241 ng/mL — ABNORMAL HIGH (ref 0.00–4.00)

## 2019-11-06 LAB — BRAIN NATRIURETIC PEPTIDE: B Natriuretic Peptide: 180 pg/mL — ABNORMAL HIGH (ref 0.0–100.0)

## 2019-11-06 NOTE — Progress Notes (Signed)
Urology Inpatient Progress Report  Anticoagulated [Z79.01] Hematuria [R31.9] Hematuria, unspecified type [R31.9]        Intv/Subj: No acute events overnight. Patient is without complaint. CT scan was performed yesterday of the abdomen and pelvis without contrast.  This revealed suspected metastatic prostate cancer with nodular masses adjacent to the prostate and distal sigmoid colon.  There was left pelvic, perirectal, retroperitoneal, and mesenteric lymphadenopathy and probable peritoneal disease.  There was also pelvic and suspected vertebral osseous mets.  There was a possible 2 cm right lower lung nodule.  There was mild left hydronephrosis down to the level of the pelvis.  Hemoglobin is relatively stable.  Urine appears to be clearing on light CBI.  Primary team is planning for interventional radiology consultation for possible IVC filter placement so that he can stay off Xarelto.  Principal Problem:   Hematuria Active Problems:   CAD (coronary artery disease)   Insulin dependent type 2 diabetes mellitus (HCC)   Essential hypertension   Chronic anticoagulation   CKD (chronic kidney disease) stage 3, GFR 30-59 ml/min   Chronic diastolic CHF (congestive heart failure) (HCC)   Elevated LFTs   Acute blood loss anemia   OSA on CPAP   Dyslipidemia   Morbid obesity (HCC)   DVT (deep venous thrombosis) ([place])  Current Facility-Administered Medications  Medication Dose Route Frequency Provider Last Rate Last Admin  . atorvastatin (LIPITOR) tablet 80 mg  80 mg Oral Daily Tu, Ching T, DO   80 mg at 11/05/19 1715  . carvedilol (COREG) tablet 25 mg  25 mg Oral BID WC Tu, Ching T, DO   25 mg at 11/06/19 0848  . Chlorhexidine Gluconate Cloth 2 % PADS 6 each  6 each Topical Daily Tu, Ching T, DO   6 each at 11/05/19 1133  . Chlorhexidine Gluconate Cloth 2 % PADS 6 each  6 each Topical Daily Tu, Ching T, DO   6 each at 11/06/19 0849  . DULoxetine (CYMBALTA) DR capsule 60 mg  60 mg Oral  Daily Tu, Ching T, DO   60 mg at 11/06/19 0848  . finasteride (PROSCAR) tablet 5 mg  5 mg Oral Daily Tu, Ching T, DO   5 mg at 11/06/19 0848  . insulin aspart (novoLOG) injection 0-15 Units  0-15 Units Subcutaneous TID WC Tu, Ching T, DO   2 Units at 11/06/19 0848  . opium-belladonna (B&O SUPPRETTES) 16.2-60 MG suppository 1 suppository  1 suppository Rectal Q8H PRN Marton Redwood III, MD      . sodium chloride irrigation 0.9 % 3,000 mL  3,000 mL Irrigation Continuous Tu, Ching T, DO   Stopped at 11/05/19 0035     Objective: Vital: Vitals:   11/05/19 2113 11/06/19 0536 11/06/19 0849 11/06/19 1124  BP: (!) 121/47 (!) 137/56 (!) 126/53 (!) 128/55  Pulse: 66 73 73 73  Resp: (!) 24 16  18   Temp: 98.4 F (36.9 C) 98 F (36.7 C)  98.6 F (37 C)  TempSrc: Oral Oral  Oral  SpO2: 98% 96%  98%  Weight:      Height:       I/Os: I/O last 3 completed shifts: In: 48000 [Other:48000] Out: [place] [[id number]; Other:4250]  Physical Exam:  General: Patient is in no apparent distress Lungs: Normal respiratory effort, chest expands symmetrically. GI: The abdomen is soft and nontender without mass. Foley: Three-way Foley catheter in place draining clear on slow CBI Ext: lower extremities symmetric  Lab Results: Recent Labs  11/04/19 1248 11/05/19 0020 11/05/19 0623 11/05/19 1500 11/06/19 0514  WBC 11.9*  --   --   --  12.1*  HGB 8.3*   < > 8.6* 7.9* 7.7*  HCT 26.1*   < > 27.0* 24.1* 24.0*   < > = values in this interval not displayed.   Recent Labs    11/04/19 1248 11/05/19 0623 11/06/19 0514  NA 134* 135 137  K 4.6 4.4 4.6  CL 100 101 104  CO2 24 24 24   GLUCOSE 136* 154* 128*  BUN 27* 29* 27*  CREATININE 1.98* 1.61* 1.62*  CALCIUM 9.3 9.4 9.2   Recent Labs    11/04/19 1527  INR 2.0*   No results for input(s): LABURIN in the last 72 hours. Results for orders placed or performed during the hospital encounter of 11/04/19  Urine culture     Status: Abnormal    Collection Time: 11/04/19  3:52 PM   Specimen: Urine, Random  Result Value Ref Range Status   Specimen Description   Final    URINE, RANDOM Performed at [facility], [address] [place], [place] [address]    Special Requests   Final    NONE Performed at [facility], [address], [place], [place] [address]    Culture (A)  Final    <10,000 COLONIES/mL INSIGNIFICANT GROWTH Performed at [facility], [place] [address] [place], [place] [address]    Report Status 11/05/2019 FINAL  Final  SARS CORONAVIRUS 2 (TAT 6-24 HRS) Nasopharyngeal Nasopharyngeal Swab     Status: None   Collection Time: 11/04/19  8:06 PM   Specimen: Nasopharyngeal Swab  Result Value Ref Range Status   SARS Coronavirus 2 NEGATIVE NEGATIVE Final    Comment: (NOTE) SARS-CoV-2 target nucleic acids are NOT DETECTED. The SARS-CoV-2 RNA is generally detectable in upper and lower respiratory specimens during the acute phase of infection. Negative results do not preclude SARS-CoV-2 infection, do not rule out co-infections with other pathogens, and should not be used as the sole basis for treatment or other patient management decisions. Negative results must be combined with clinical observations, patient history, and epidemiological information. The expected result is Negative. Fact Sheet for Patients: SugarRoll.be Fact Sheet for Healthcare Providers: https://www.woods-mathews.com/ This test is not yet approved or cleared by the [place] FDA and  has been authorized for detection and/or diagnosis of SARS-CoV-2 by FDA under an Emergency Use Authorization (EUA). This EUA will remain  in effect (meaning this test can be used) for the duration of the COVID-19 declaration under Section 56 4(b)(1) of the Act, 21 U.S.C. section 360bbb-3(b)(1), unless the authorization is terminated or revoked sooner. Performed at [facility], [place] [address], [place], [place] [address]     Studies/Results: CT RENAL STONE STUDY  Result Date: 11/05/2019 CLINICAL DATA:  Hematuria of indeterminate etiology. EXAM: CT ABDOMEN AND PELVIS WITHOUT CONTRAST TECHNIQUE: Multidetector CT imaging of the abdomen and pelvis was performed following the standard protocol without IV contrast. COMPARISON:  None available FINDINGS: Lower chest: Extensive coronary calcifications. Small right pleural effusion with atelectasis/consolidation in the adjacent right lower lobe. Possible 1.5 cm subpleural nodule, lateral basal segment right lower lobe. Nodules about the right diaphragmatic leaflet. Hepatobiliary: No liver lesion or biliary ductal dilatation. Gallbladder nondilated. Pancreas: Unremarkable. No pancreatic ductal dilatation or surrounding inflammatory changes. Spleen: Normal in size without focal abnormality. Probable accessory splenule. Adrenals/Urinary Tract: Adrenal glands unremarkable. Mild left hydronephrosis and ureterectasis down to  the level of pelvic mass. 6.5 cm exophytic fluid attenuation lesion from the mid right kidney possibly cyst but incompletely characterized. Similar smaller 1.7 cm nonspecific medial right renal lesion. Urinary bladder partially decompressed by Foley catheter. There is a small amount of gas in the lumen. There is high density material in the lumen of the bladder suggesting clot. Prostate hypertrophy/mass protrudes into the lumen of the urinary bladder. Stomach/Bowel: Stomach is decompressed. Small bowel is nondistended. Moderate fecal material in the proximal colon. There is gaseous distention of the redundant sigmoid colon. This distal sigmoid colon is decompressed and surrounded by multiple retroperitoneal nodules. Rectum decompressed, unremarkable. Vascular/Lymphatic: Aortoiliac atherosclerosis (ICD10-170.0) without aneurysm. Bulky left external and common iliac and perirectal adenopathy. Subcentimeter left para-aortic and aortocaval nodes.  Bulky confluent nodules/adenopathy extends from prostate along the distal sigmoid colon, with scattered nodules in the sigmoid mesocolon. Reproductive: Bulky prostate enlargement with adjacent confluent masses/aadenopathy as above. Other: Peritoneal nodules in the right upper quadrant, confluent over the dome of the liver. 1.6 cm nodule in the right pericolic gutter. Musculoskeletal: 2 cm lytic lesion in the right iliac wing. Heterogenous appearance of lower thoracic and lumbar vertebral bodies and sacrum, with probable pathologic compression deformity L1 vertebral body. IMPRESSION: 1. Suspected metastatic prostate carcinoma with confluent nodular masses adjacent to the prostate and distal sigmoid colon with left pelvic, perirectal, retroperitoneal, and mesenteric adenopathy as well as probable peritoneal disease most marked in the right upper quadrant extending about the right diaphragmatic leaflet. 2. Pelvic and suspected vertebral osseous metastatic disease with L1 compression fracture deformity, likely pathologic. 3. Possible 2 cm right lower lobe lung nodule. 4. Left hydronephrosis and proximal ureterectasis secondary to pelvic process. Electronically Signed   By: Lucrezia Europe M.D.   On: 11/05/2019 13:20    Assessment: Gross hematuria Acute blood loss anemia Left hydronephrosis likely secondary to extrinsic compression Possible metastatic prostate cancer Secondary bone metastasis Secondary malignant lymphadenopathy  Plan: PSA is pending.  If this points toward metastatic prostate cancer, he will need androgen deprivation therapy with Mills Koller.  Agree with IVC filter placement.  Continue to hold blood thinners.  It appears that his urine is clearing off his blood thinner.  Link Snuffer, MD Urology 11/06/2019, 12:04 PM '

## 2019-11-06 NOTE — Progress Notes (Signed)
PROGRESS NOTE  [name] HLK:[id number] DOB: 25-Sep-1932 DOA: 11/04/2019 PCP: [name], PA-C  HPI/Recap of past 24 hours: Patient is an 86 year old male with past medical history of CAD status post stent, chronic diastolic heart failure, sleep apnea, diabetes mellitus, stage III chronic kidney disease and recent DVT diagnosed 3 months ago placed on Eliquis who presented with hematuria.  Per his assisted living facility, patient had been reportedly having hematuria for the past 5 days.  In the emergency room, patient was noted to have a white count of 11.9 and a hemoglobin of 8.3, down from 9.52 weeks prior.  He was noted to have gross hematuria with clots on bladder scan noted a third of a liter.  Patient was started on continuous bladder irrigation and urology was consulted.  Urology was able to irrigate the bladder with about a liter of normal saline and then the patient was set up for continuous bladder irrigation.  He was also transfused 1 unit packed red blood cells.  His Eliquis was held.  Following transfusion, patient's hemoglobin improved to 8.6, and by this afternoon, down to 7.9.  In discussion with urology, they suspect that he has metastatic prostate cancer.  CT scan of abdomen pelvis notes interventional radiology consulted for placement of IVC filter.  Patient self with no complaints, denies any pain  Assessment/Plan: Principal Problem:   Hematuria/acute blood loss anemia in patient with BPH on anticoagulation: Anticoagulation discontinued.  Being followed by urology.  Hemoglobin remaining stable for now. Active Problems:   CAD (coronary artery disease) stable.    Insulin dependent type 2 diabetes mellitus ([place]): On sliding scale.    Essential hypertension: Blood pressures have been stable to slightly on the high end.  Continue home medications.    CKD (chronic kidney disease) stage 3, GFR 30-59 ml/min: Stable.,  At baseline.    Chronic diastolic CHF (congestive  heart failure) [facility]): Monitor closely.  Given blood and IVC fluid, likely could become hypervolemic easily.  Diurese as appropriate.    Elevated LFTs  Likely metastatic prostate cancer: Really appreciate urology help.  PSA pending.  Can discuss hormone therapy options as well as palliative measures especially if has bone metastases.  He will at some point need a bone scan    OSA on CPAP: Nightly CPAP.   Dyslipidemia: Continue statin.    Morbid obesity ([place]): Meets criteria for BMI greater than 35+ diabetes and hypertension and heart failure.    DVT (deep venous thrombosis) ([place]): Diagnosed 3 months ago.  Would likely benefit from IVC filter placement, interventional radiology consulted.   Code Status: Full code  Family Communication: Updated daughter by phone  Disposition Plan: Home once hematuria stabilized, IVC filter placed and plan for likely prostate cancer in place   Consultants:  Urology  Interventional radiology  Procedures:  Bladder irrigation done 1/15  Antimicrobials:  None  DVT prophylaxis: SCDs   Objective: Vitals:   11/06/19 0849 11/06/19 1124  BP: (!) 126/53 (!) 128/55  Pulse: 73 73  Resp:  18  Temp:  98.6 F (37 C)  SpO2:  98%    Intake/Output Summary (Last 24 hours) at 11/06/2019 1313 Last data filed at 11/06/2019 1037 Gross per 24 hour  Intake 33000 ml  Output 40775 ml  Net -7775 ml   Filed Weights   11/04/19 1241 11/05/19 0135  Weight: 123.4 kg 115.4 kg   Body mass index is 35.48 kg/m.  Exam:   General: Awake, oriented x2, no acute distress  HEENT: Normocephalic and atraumatic, mucous membranes slightly dry  Neck: Thick, narrow airway  Cardiovascular: Regular rate and rhythm, S1-S2  Respiratory: Clear to auscultation bilaterally  Abdomen: Soft, mild distended, nontender, hypoactive bowel sounds  Musculoskeletal: No clubbing or cyanosis, trace pitting edema bilaterally  Skin: No skin breaks, tears or  lesions  Psychiatry: Appropriate, no evidence of psychoses   Data Reviewed: CBC: Recent Labs  Lab 11/04/19 1248 11/05/19 0020 11/05/19 0623 11/05/19 1500 11/06/19 0514  WBC 11.9*  --   --   --  12.1*  NEUTROABS 9.8*  --   --   --   --   HGB 8.3* 7.9* 8.6* 7.9* 7.7*  HCT 26.1* 25.2* 27.0* 24.1* 24.0*  MCV 85.9  --   --   --  86.6  PLT 262  --   --   --  299   Basic Metabolic Panel: Recent Labs  Lab 11/04/19 1248 11/05/19 0623 11/06/19 0514  NA 134* 135 137  K 4.6 4.4 4.6  CL 100 101 104  CO2 24 24 24   GLUCOSE 136* 154* 128*  BUN 27* 29* 27*  CREATININE 1.98* 1.61* 1.62*  CALCIUM 9.3 9.4 9.2   GFR: Estimated Creatinine Clearance: 43.1 mL/min (A) (by C-G formula based on SCr of 1.62 mg/dL (H)). Liver Function Tests: Recent Labs  Lab 11/04/19 1248  AST 43*  ALT 45*  ALKPHOS 159*  BILITOT 1.1  PROT 7.0  ALBUMIN 3.2*   Recent Labs  Lab 11/04/19 1248  LIPASE 20   No results for input(s): AMMONIA in the last 168 hours. Coagulation Profile: Recent Labs  Lab 11/04/19 1527  INR 2.0*   Cardiac Enzymes: No results for input(s): CKTOTAL, CKMB, CKMBINDEX, TROPONINI in the last 168 hours. BNP (last 3 results) No results for input(s): PROBNP in the last 8760 hours. HbA1C: No results for input(s): HGBA1C in the last 72 hours. CBG: Recent Labs  Lab 11/05/19 1214 11/05/19 1654 11/05/19 2108 11/06/19 0841 11/06/19 1122  GLUCAP 132* 112* 134* 123* 113*   Lipid Profile: No results for input(s): CHOL, HDL, LDLCALC, TRIG, CHOLHDL, LDLDIRECT in the last 72 hours. Thyroid Function Tests: No results for input(s): TSH, T4TOTAL, FREET4, T3FREE, THYROIDAB in the last 72 hours. Anemia Panel: No results for input(s): VITAMINB12, FOLATE, FERRITIN, TIBC, IRON, RETICCTPCT in the last 72 hours. Urine analysis:    Component Value Date/Time   COLORURINE RED (A) 11/06/2019 1039   APPEARANCEUR HAZY (A) 11/06/2019 1039   LABSPEC 1.005 11/06/2019 1039   PHURINE   11/06/2019 1039    TEST NOT REPORTED DUE TO COLOR INTERFERENCE OF URINE PIGMENT   GLUCOSEU (A) 11/06/2019 1039    TEST NOT REPORTED DUE TO COLOR INTERFERENCE OF URINE PIGMENT   HGBUR (A) 11/06/2019 1039    TEST NOT REPORTED DUE TO COLOR INTERFERENCE OF URINE PIGMENT   BILIRUBINUR (A) 11/06/2019 1039    TEST NOT REPORTED DUE TO COLOR INTERFERENCE OF URINE PIGMENT   KETONESUR (A) 11/06/2019 1039    TEST NOT REPORTED DUE TO COLOR INTERFERENCE OF URINE PIGMENT   PROTEINUR (A) 11/06/2019 1039    TEST NOT REPORTED DUE TO COLOR INTERFERENCE OF URINE PIGMENT   NITRITE (A) 11/06/2019 1039    TEST NOT REPORTED DUE TO COLOR INTERFERENCE OF URINE PIGMENT   LEUKOCYTESUR (A) 11/06/2019 1039    TEST NOT REPORTED DUE TO COLOR INTERFERENCE OF URINE PIGMENT   Sepsis Labs: @LABRCNTIP (procalcitonin:4,lacticidven:4)  ) Recent Results (from the past 240 hour(s))  Urine culture  Status: Abnormal   Collection Time: 11/04/19  3:52 PM   Specimen: Urine, Random  Result Value Ref Range Status   Specimen Description   Final    URINE, RANDOM Performed at [facility], [address] [place], [place] [address]    Special Requests   Final    NONE Performed at [facility], [address] [place], [place] [address]    Culture (A)  Final    <10,000 COLONIES/mL INSIGNIFICANT GROWTH Performed at [facility] [address], [place], [place] [address]    Report Status 11/05/2019 FINAL  Final  SARS CORONAVIRUS 2 (TAT 6-24 HRS) Nasopharyngeal Nasopharyngeal Swab     Status: None   Collection Time: 11/04/19  8:06 PM   Specimen: Nasopharyngeal Swab  Result Value Ref Range Status   SARS Coronavirus 2 NEGATIVE NEGATIVE Final    Comment: (NOTE) SARS-CoV-2 target nucleic acids are NOT DETECTED. The SARS-CoV-2 RNA is generally detectable in upper and lower respiratory specimens during the acute phase of infection. Negative results do not preclude SARS-CoV-2 infection, do not rule  out co-infections with other pathogens, and should not be used as the sole basis for treatment or other patient management decisions. Negative results must be combined with clinical observations, patient history, and epidemiological information. The expected result is Negative. Fact Sheet for Patients: SugarRoll.be Fact Sheet for Healthcare Providers: https://www.woods-mathews.com/ This test is not yet approved or cleared by the [place] FDA and  has been authorized for detection and/or diagnosis of SARS-CoV-2 by FDA under an Emergency Use Authorization (EUA). This EUA will remain  in effect (meaning this test can be used) for the duration of the COVID-19 declaration under Section 56 4(b)(1) of the Act, 21 U.S.C. section 360bbb-3(b)(1), unless the authorization is terminated or revoked sooner. Performed at [facility], [place] [address] [place], [place] [address]       Studies: No results found.  Scheduled Meds: . atorvastatin  80 mg Oral Daily  . carvedilol  25 mg Oral BID WC  . Chlorhexidine Gluconate Cloth  6 each Topical Daily  . Chlorhexidine Gluconate Cloth  6 each Topical Daily  . DULoxetine  60 mg Oral Daily  . finasteride  5 mg Oral Daily  . insulin aspart  0-15 Units Subcutaneous TID WC    Continuous Infusions: . sodium chloride irrigation Stopped (11/05/19 0035)     LOS: 2 days     Annita Brod, MD [facility]  To reach me or the doctor on call, go to: [email or web address] Password [email or web address]  11/06/2019, 1:13 PM

## 2019-11-07 ENCOUNTER — Other Ambulatory Visit (INDEPENDENT_AMBULATORY_CARE_PROVIDER_SITE_OTHER): Payer: Self-pay | Admitting: Vascular Surgery

## 2019-11-07 ENCOUNTER — Encounter
Admission: EM | Disposition: A | Discharge status: Skilled Nursing Facility | Payer: Self-pay | Source: Skilled Nursing Facility | Attending: Hospitalist

## 2019-11-07 DIAGNOSIS — I82409 Acute embolism and thrombosis of unspecified deep veins of unspecified lower extremity: Secondary | ICD-10-CM

## 2019-11-07 DIAGNOSIS — R319 Hematuria, unspecified: Secondary | ICD-10-CM

## 2019-11-07 DIAGNOSIS — Z7901 Long term (current) use of anticoagulants: Secondary | ICD-10-CM

## 2019-11-07 DIAGNOSIS — C61 Malignant neoplasm of prostate: Secondary | ICD-10-CM | POA: Diagnosis present

## 2019-11-07 DIAGNOSIS — C7951 Secondary malignant neoplasm of bone: Secondary | ICD-10-CM | POA: Diagnosis present

## 2019-11-07 DIAGNOSIS — I8290 Acute embolism and thrombosis of unspecified vein: Secondary | ICD-10-CM

## 2019-11-07 HISTORY — PX: IVC FILTER INSERTION: CATH118245

## 2019-11-07 LAB — CBC
HCT: 24.5 % — ABNORMAL LOW (ref 39.0–52.0)
Hemoglobin: 7.7 g/dL — ABNORMAL LOW (ref 13.0–17.0)
MCH: 27.3 pg (ref 26.0–34.0)
MCHC: 31.4 g/dL (ref 30.0–36.0)
MCV: 86.9 fL (ref 80.0–100.0)
Platelets: 272 10*3/uL (ref 150–400)
RBC: 2.82 MIL/uL — ABNORMAL LOW (ref 4.22–5.81)
RDW: 14.8 % (ref 11.5–15.5)
WBC: 11.2 10*3/uL — ABNORMAL HIGH (ref 4.0–10.5)
nRBC: 0 % (ref 0.0–0.2)

## 2019-11-07 LAB — COMPREHENSIVE METABOLIC PANEL
ALT: 31 U/L (ref 0–44)
AST: 23 U/L (ref 15–41)
Albumin: 2.7 g/dL — ABNORMAL LOW (ref 3.5–5.0)
Alkaline Phosphatase: 111 U/L (ref 38–126)
Anion gap: 9 (ref 5–15)
BUN: 23 mg/dL (ref 8–23)
CO2: 24 mmol/L (ref 22–32)
Calcium: 9.3 mg/dL (ref 8.9–10.3)
Chloride: 105 mmol/L (ref 98–111)
Creatinine, Ser: 1.45 mg/dL — ABNORMAL HIGH (ref 0.61–1.24)
GFR calc Af Amer: 51 mL/min — ABNORMAL LOW (ref >60)
GFR calc non Af Amer: 44 mL/min — ABNORMAL LOW (ref >60)
Glucose, Bld: 158 mg/dL — ABNORMAL HIGH (ref 70–99)
Potassium: 4.4 mmol/L (ref 3.5–5.1)
Sodium: 138 mmol/L (ref 135–145)
Total Bilirubin: 1 mg/dL (ref 0.3–1.2)
Total Protein: 6.2 g/dL — ABNORMAL LOW (ref 6.5–8.1)

## 2019-11-07 LAB — GLUCOSE, CAPILLARY
Glucose-Capillary: 146 mg/dL — ABNORMAL HIGH (ref 70–99)
Glucose-Capillary: 127 mg/dL — ABNORMAL HIGH (ref 70–99)
Glucose-Capillary: 137 mg/dL — ABNORMAL HIGH (ref 70–99)
Glucose-Capillary: 146 mg/dL — ABNORMAL HIGH (ref 70–99)
Glucose-Capillary: 161 mg/dL — ABNORMAL HIGH (ref 70–99)

## 2019-11-07 LAB — URINE CULTURE: Culture: NO GROWTH

## 2019-11-07 SURGERY — IVC FILTER INSERTION
Anesthesia: Moderate Sedation

## 2019-11-07 MED ORDER — SODIUM CHLORIDE 0.9 % IV SOLN: INTRAVENOUS | Status: DC

## 2019-11-07 MED ORDER — CEFAZOLIN SODIUM-DEXTROSE 1-4 GM/50ML-% IV SOLN
1.0000 g | Freq: Once | INTRAVENOUS | Status: AC
  Filled 2019-11-07: qty 50

## 2019-11-07 MED ORDER — MIDAZOLAM HCL 2 MG/ML PO SYRP
8.0000 mg | ORAL_SOLUTION | Freq: Once | ORAL | Status: DC | PRN
  Filled 2019-11-07: qty 4

## 2019-11-07 MED ORDER — FAMOTIDINE 20 MG PO TABS: 40.0000 mg | ORAL_TABLET | Freq: Once | ORAL | Status: DC | PRN

## 2019-11-07 MED ORDER — MIDAZOLAM HCL 5 MG/5ML IJ SOLN
INTRAMUSCULAR | Status: AC
  Filled 2019-11-07: qty 5

## 2019-11-07 MED ORDER — MIDAZOLAM HCL 2 MG/2ML IJ SOLN
INTRAMUSCULAR | Status: DC | PRN
  Administered 2019-11-07 (×2): 1 mg via INTRAVENOUS

## 2019-11-07 MED ORDER — FENTANYL CITRATE (PF) 100 MCG/2ML IJ SOLN
INTRAMUSCULAR | Status: AC
  Filled 2019-11-07: qty 2

## 2019-11-07 MED ORDER — CEFAZOLIN SODIUM-DEXTROSE 1-4 GM/50ML-% IV SOLN
INTRAVENOUS | Status: AC
  Administered 2019-11-07: 16:00:00 1 g via INTRAVENOUS
  Filled 2019-11-07: qty 50

## 2019-11-07 MED ORDER — DIPHENHYDRAMINE HCL 50 MG/ML IJ SOLN: 50.0000 mg | Freq: Once | INTRAMUSCULAR | Status: DC | PRN

## 2019-11-07 MED ORDER — METHYLPREDNISOLONE SODIUM SUCC 125 MG IJ SOLR: 125.0000 mg | Freq: Once | INTRAMUSCULAR | Status: DC | PRN

## 2019-11-07 MED ORDER — ONDANSETRON HCL 4 MG/2ML IJ SOLN
4.0000 mg | Freq: Four times a day (QID) | INTRAMUSCULAR | Status: DC | PRN
  Administered 2019-11-09 – 2019-11-13 (×3): 4 mg via INTRAVENOUS
  Filled 2019-11-07 (×3): qty 2

## 2019-11-07 MED ORDER — HYDROMORPHONE HCL 1 MG/ML IJ SOLN: 1.0000 mg | Freq: Once | INTRAMUSCULAR | Status: DC | PRN

## 2019-11-07 MED ORDER — FENTANYL CITRATE (PF) 100 MCG/2ML IJ SOLN
INTRAMUSCULAR | Status: DC | PRN
  Administered 2019-11-07 (×2): 25 ug via INTRAVENOUS

## 2019-11-07 SURGICAL SUPPLY — 3 items
KIT FEMORAL DEL DENALI (Miscellaneous) ×2 IMPLANT
PACK ANGIOGRAPHY (CUSTOM PROCEDURE TRAY) ×2 IMPLANT
WIRE J 3MM .035X145CM (WIRE) ×2 IMPLANT

## 2019-11-07 NOTE — Op Note (Signed)
[facility]   OPERATIVE NOTE    PRE-OPERATIVE DIAGNOSIS: DVT, hematuria  POST-OPERATIVE DIAGNOSIS: same as above  PROCEDURE: 1.   Ultrasound guidance for vascular access to the right vein 2.   Catheter placement into the inferior vena cava 3.   Inferior venacavogram 4.   Placement of a Bard Denali IVC filter  SURGEON: Leotis Pain, MD  ASSISTANT(S): None  ANESTHESIA: local with Moderate Conscious Sedation for approximately 15 minutes using 2 mg of Versed and 50 mcg of Fentanyl  ESTIMATED BLOOD LOSS: minimal  CONTRAST: 15 cc  FLUORO TIME: less than one minute  FINDING(S): 1.  Patent IVC  SPECIMEN(S):  none  INDICATIONS:   [name] is a 86 y.o. male who presents with previous DVT and now hematuria on anticoagulation requiring cessation of anticoagulation.  Inferior vena cava filter is indicated for this reason.  Risks and benefits including filter thrombosis, migration, fracture, bleeding, and infection were all discussed.  We discussed that all IVC filters that we place can be removed if desired from the patient once the need for the filter has passed.    DESCRIPTION: After obtaining full informed written consent, the patient was brought back to the vascular suite. The skin was sterilely prepped and draped in a sterile surgical field was created. Moderate conscious sedation was administered during a face to face encounter with the patient throughout the procedure with my supervision of the RN administering medicines and monitoring the patient's vital signs, pulse oximetry, telemetry and mental status throughout from the start of the procedure until the patient was taken to the recovery room. The right femoral vein was accessed under direct ultrasound guidance without difficulty with a Seldinger needle and a J-wire was then placed. After skin nick and dilatation, the delivery sheath was placed into the inferior vena cava and an inferior venacavogram was  performed. This demonstrated a patent IVC with the level of the renal veins at the L1-L2 interspace.  The filter was then deployed into the inferior vena cava at the level of the bottom of L2 just below the renal veins. The delivery sheath was then removed. Pressure was held. Sterile dressings were placed. The patient tolerated the procedure well and was taken to the recovery room in stable condition.  COMPLICATIONS: None  CONDITION: Stable  Leotis Pain  11/07/2019, 4:20 PM   This note was created with Dragon Medical transcription system. Any errors in dictation are purely unintentional.

## 2019-11-07 NOTE — Consult Note (Addendum)
[facility]  Date of admission:  11/04/2019  Inpatient day:  11/08/2019  Consulting physician: Dr Gevena Barre  Reason for Consultation:  Metastatic prostate cancer  Chief Complaint: [name] is a 86 y.o. male who was admitted with hematuria.  HPI:  The patient states that he has been a resident at [facility] since 09/2019 when he was admitted after multiple falls.   He has a history of CAD s/p stent, chronic diastolic heart failure, stage III kidney disease, and DVT on Eliquis, Plavix, and aspirin.  At the care facility, he was noted to have 2-5 days of hematuria.    CBC on 11/04/2019 revealed a hematocrit of 26.1, hemoglobin 8.3, MCV 85.9, and platelets 262,000 (hematocrit 29.8, hemoglobin 9.5 on 10/19/2019).  Creatinine was 1.98 (2.05 on 10/20/2019).  PT was 22.4 (INR 2.0) and PTT 53.  Additional labs included an alklaine phosphatase of 159 (38-126).  Albumen was 3.2 with a protein of 7.0 and a calcium of 9.3.  He began bladder irrigation.  Aspirin, Plavix, and Eliquis were held. Hemoglobin dropped to 7.9 on 11/05/2019.  He received 1 unit of PRBCs on 11/05/2019.   Renal stone CT on 11/05/2019 revealed suspected metastatic prostate carcinoma with confluent nodular masses adjacent to the prostate and distal sigmoid colon with left pelvic, perirectal, retroperitoneal, and mesenteric adenopathy as well as probable peritoneal disease most marked in the right upper quadrant extending about the right diaphragmatic leaflet.  There was pelvic and suspected vertebral osseous metastatic disease with L1 compression fracture deformity, likely pathologic.  There was a possible 2 cm right lower lobe lung nodule.  There was left hydronephrosis and proximal ureterectasis secondary to pelvic process (prior renal ultrasound on 10/16/2019 revealed no hydronephrosis).  PSA  was 2,241.0 on 11/06/2019.   Symptomatically, he notes weight loss (previosuly weighed 338 pounds).   He has had issues with falls.  Hematuria has been recent.  He denies any bone pain.   Past Medical History:  Diagnosis Date  . Anemia, normocytic normochromic   . BPH (benign prostatic hyperplasia)   . CAD (coronary artery disease)   . Cataracts, bilateral   . Chronic renal insufficiency   . Colon polyp   . COPD (chronic obstructive pulmonary disease) ([place])   . Diabetes mellitus without complication ([place])   . DVT (deep venous thrombosis) ([place])   . Edema of both legs   . Glaucoma   . Gout   . Heart murmur   . Hepatitis C antibody test positive 10/20/2019  . Hyperlipidemia   . Hypertension   . Moderate persistent asthma without complication   . Myocardial infarction ([place])   . Neuromuscular disorder ([place])   . Neuropathy   . Peripheral vascular disease ([place])   . Premature ejaculation   . Prostate cancer ([place])   . RLS (restless legs syndrome)   . Sleep apnea   . Stented coronary artery 11/13/2015  . Uncontrolled type 2 diabetes mellitus with stage 3 chronic kidney disease, with long-term current use of insulin ([place])     Past Surgical History:  Procedure Laterality Date  . APPENDECTOMY    . [place]  2009  . COLONOSCOPY  2012  . EYE SURGERY    . PROSTATE SURGERY    . TONSILLECTOMY      History reviewed. No pertinent family history.  Social History:  reports that he has quit smoking. He has never used smokeless tobacco. No history on file for alcohol and drug.  He  previously smoked < 1 pack/day x 20 years (stopped smoking with cardiac stent placement).  He previously drank liquor on the weekend (stopped 2020).  He is retired Dance movement psychotherapist).  He previously lived outside of [place].  He then moved to [place] then [place] to live with his daughter Suhail Peloquin [phone]).  His wife died of cancer 1 year ago.    Allergies:  Allergies  Allergen Reactions  . Metformin And Related Diarrhea    Intolerance due to naturally loose bowels     Medications Prior to Admission  Medication Sig Dispense Refill  . aspirin 81 MG chewable tablet Chew 81 mg by mouth daily.     Marland Kitchen atorvastatin (LIPITOR) 80 MG tablet Take 80 mg by mouth daily.    . carvedilol (COREG) 25 MG tablet Take 25 mg by mouth 2 (two) times daily with a meal.    . Cholecalciferol (VITAMIN D) 50 MCG (2000 UT) tablet Take 2,000 Units by mouth daily.     . clopidogrel (PLAVIX) 75 MG tablet Take 75 mg by mouth daily.    . DULoxetine (CYMBALTA) 60 MG capsule Take 60 mg by mouth daily.    Marland Kitchen ELIQUIS 5 MG TABS tablet Take 5 mg by mouth every 12 (twelve) hours.     . famotidine (PEPCID) 20 MG tablet Take 20 mg by mouth at bedtime as needed for heartburn.     . finasteride (PROSCAR) 5 MG tablet Take 5 mg by mouth daily.    . insulin aspart (NOVOLOG) 100 UNIT/ML FlexPen Inject 12 Units into the skin 3 (three) times daily with meals.     . Insulin Detemir (LEVEMIR) 100 UNIT/ML Pen Inject 30 Units into the skin 2 (two) times daily.    . Multiple Vitamin (MULTI-VITAMIN) tablet Take 1 tablet by mouth daily.    . nitroGLYCERIN (NITROSTAT) 0.4 MG SL tablet Place 0.4 mg under the tongue every 5 (five) minutes as needed for chest pain.     Marland Kitchen torsemide (DEMADEX) 20 MG tablet Take 2 tablets (40 mg total) by mouth daily. (Patient not taking: Reported on 11/04/2019)      Review of Systems: GENERAL:  Feels "ok".   Weight loss of 84 pounds (338 to 254 pounds).  No fevers or sweats. PERFORMANCE STATUS (ECOG):  2 HEENT:  No visual changes, runny nose, sore throat, mouth sores or tenderness. Lungs: No shortness of breath or cough.  No hemoptysis. Cardiac:  No chest pain, palpitations, orthopnea, or PND. GI:  No nausea, vomiting, diarrhea, constipation, melena or hematochezia. GU:  Hematuria x 2-5 days prior to admission.  No urgency, frequency, or dysuria. Musculoskeletal:  No back pain.  Joint pain (shoulder, knees, hands).  No muscle tenderness. Extremities:  No pain or swelling. Skin:   No rashes or skin changes. Neuro:  No headache, numbness or weakness, balance or coordination issues.  H/o falls. Endocrine:  Diabetes.  No thyroid issues, hot flashes or night sweats. Psych:  No mood changes, depression or anxiety. Pain:  No focal pain. Review of systems:  All other systems reviewed and found to be negative.  Physical Exam:  Blood pressure (!) 158/75, pulse 71, temperature 98.1 F (36.7 C), temperature source Oral, resp. rate 20, height 5\' 11"  (1.803 m), weight 254 lb 6.6 oz (115.4 kg), SpO2 100 %.  GENERAL:  Well developed, well nourished,elderly gentleman lying comfortably on the medical unit in no acute distress. MENTAL STATUS:  Alert and oriented to person, place and time. HEAD:  Graying short hair and beard.  Male pattern baldness.  Normocephalic, atraumatic, face symmetric, no Cushingoid features. EYES:  Brown eyes.  Pupils equal round and reactive to light and accomodation.  No conjunctivitis or scleral icterus. ENT:  Oropharynx clear without lesion.  Tongue normal. Mucous membranes moist.  RESPIRATORY:  Clear to auscultation anteriorly without rales, wheezes or rhonchi. CARDIOVASCULAR:  Regular rate and rhythm without murmur, rub or gallop. ABDOMEN:  Soft, non-tender, with active bowel sounds, and no appreciable hepatosplenomegaly.  No masses. GU:  Catheter in place with pink urine. SKIN:  No rashes, ulcers or lesions. EXTREMITIES:  No edema, no skin discoloration or tenderness.  No palpable cords.  Right groin with Tegaderm s/p IVC filter placement. LYMPH NODES: No palpable cervical, supraclavicular, axillary or inguinal adenopathy  NEUROLOGICAL: Unremarkable. PSYCH:  Appropriate.   Results for orders placed or performed during the hospital encounter of 11/04/19 (from the past 48 hour(s))  Glucose, capillary     Status: Abnormal   Collection Time: 11/06/19  8:41 AM  Result Value Ref [place]   Glucose-Capillary 123 (H) 70 - 99 mg/dL   Comment 1 Notify RN    Urinalysis, Complete w Microscopic     Status: Abnormal   Collection Time: 11/06/19 10:39 AM  Result Value Ref Range   Color, Urine RED (A) YELLOW   APPearance HAZY (A) CLEAR   Specific Gravity, Urine 1.005 1.005 - 1.030   pH  5.0 - 8.0    TEST NOT REPORTED DUE TO COLOR INTERFERENCE OF URINE PIGMENT   Glucose, UA (A) NEGATIVE mg/dL    TEST NOT REPORTED DUE TO COLOR INTERFERENCE OF URINE PIGMENT   Hgb urine dipstick (A) NEGATIVE    TEST NOT REPORTED DUE TO COLOR INTERFERENCE OF URINE PIGMENT   Bilirubin Urine (A) NEGATIVE    TEST NOT REPORTED DUE TO COLOR INTERFERENCE OF URINE PIGMENT   Ketones, ur (A) NEGATIVE mg/dL    TEST NOT REPORTED DUE TO COLOR INTERFERENCE OF URINE PIGMENT   Protein, ur (A) NEGATIVE mg/dL    TEST NOT REPORTED DUE TO COLOR INTERFERENCE OF URINE PIGMENT   Nitrite (A) NEGATIVE    TEST NOT REPORTED DUE TO COLOR INTERFERENCE OF URINE PIGMENT   Leukocytes,Ua (A) NEGATIVE    TEST NOT REPORTED DUE TO COLOR INTERFERENCE OF URINE PIGMENT   RBC / HPF >50 (H) 0 - 5 RBC/hpf   WBC, UA 21-50 0 - 5 WBC/hpf   Bacteria, UA RARE (A) NONE SEEN   Squamous Epithelial / LPF NONE SEEN 0 - 5    Comment: Performed at [facility], [address], [place], [place] [address]  Urine culture     Status: None   Collection Time: 11/06/19 10:39 AM   Specimen: Urine, Random  Result Value Ref [place]   Specimen Description      URINE, RANDOM Performed at [facility], [address], [place], [place] [address]    Special Requests      NONE Performed at [facility], [address], [place], [place] [address]    Culture      NO GROWTH Performed at [facility], [address], [place], [place] [address]    Report Status 11/07/2019 FINAL   Glucose, capillary     Status: Abnormal   Collection Time: 11/06/19 11:22 AM  Result Value Ref [place]   Glucose-Capillary 113 (H) 70 - 99 mg/dL   Comment 1 Notify RN   Glucose, capillary  Status: Abnormal    Collection Time: 11/06/19  5:18 PM  Result Value Ref [place]   Glucose-Capillary 197 (H) 70 - 99 mg/dL  Glucose, capillary     Status: Abnormal   Collection Time: 11/06/19  9:52 PM  Result Value Ref [place]   Glucose-Capillary 169 (H) 70 - 99 mg/dL  amcmet     Status: Abnormal   Collection Time: 11/07/19  6:45 AM  Result Value Ref [place]   Sodium 138 135 - 145 mmol/L   Potassium 4.4 3.5 - 5.1 mmol/L   Chloride 105 98 - 111 mmol/L   CO2 24 22 - 32 mmol/L   Glucose, Bld 158 (H) 70 - 99 mg/dL   BUN 23 8 - 23 mg/dL   Creatinine, Ser 1.45 (H) 0.61 - 1.24 mg/dL   Calcium 9.3 8.9 - 10.3 mg/dL   Total Protein 6.2 (L) 6.5 - 8.1 g/dL   Albumin 2.7 (L) 3.5 - 5.0 g/dL   AST 23 15 - 41 U/L   ALT 31 0 - 44 U/L   Alkaline Phosphatase 111 38 - 126 U/L   Total Bilirubin 1.0 0.3 - 1.2 mg/dL   GFR calc non Af Amer 44 (L) >60 mL/min   GFR calc Af Amer 51 (L) >60 mL/min   Anion gap 9 5 - 15    Comment: Performed at [facility], [address] [place], [place] [address]  amcbc     Status: Abnormal   Collection Time: 11/07/19  6:45 AM  Result Value Ref [place]   WBC 11.2 (H) 4.0 - 10.5 K/uL   RBC 2.82 (L) 4.22 - 5.81 MIL/uL   Hemoglobin 7.7 (L) 13.0 - 17.0 g/dL   HCT 24.5 (L) 39.0 - 52.0 %   MCV 86.9 80.0 - 100.0 fL   MCH 27.3 26.0 - 34.0 pg   MCHC 31.4 30.0 - 36.0 g/dL   RDW 14.8 11.5 - 15.5 %   Platelets 272 150 - 400 K/uL   nRBC 0.0 0.0 - 0.2 %    Comment: Performed at [facility], [address] [place], [place] [address]  Glucose, capillary     Status: Abnormal   Collection Time: 11/07/19  9:03 AM  Result Value Ref [place]   Glucose-Capillary 146 (H) 70 - 99 mg/dL  Glucose, capillary     Status: Abnormal   Collection Time: 11/07/19 11:58 AM  Result Value Ref [place]   Glucose-Capillary 127 (H) 70 - 99 mg/dL   Comment 1 Notify RN    Comment 2 Document in Chart   Glucose, capillary     Status: Abnormal   Collection Time: 11/07/19  4:33 PM  Result Value Ref [place]    Glucose-Capillary 146 (H) 70 - 99 mg/dL  Glucose, capillary     Status: Abnormal   Collection Time: 11/07/19  5:27 PM  Result Value Ref [place]   Glucose-Capillary 137 (H) 70 - 99 mg/dL   Comment 1 Notify RN    Comment 2 Document in Chart   Glucose, capillary     Status: Abnormal   Collection Time: 11/07/19  9:05 PM  Result Value Ref [place]   Glucose-Capillary 161 (H) 70 - 99 mg/dL   Comment 1 Notify RN    PERIPHERAL VASCULAR CATHETERIZATION  Result Date: 11/07/2019 See op note   Assessment:  The patient is a 86 y.o. gentleman with metastatic prostate cancer.  He presented with gross hematuria.  PSA  was 2,241.0 on 11/06/2019.   Renal stone  CT on 11/05/2019 revealed confluent nodular masses adjacent to the prostate and distal sigmoid colon with left pelvic, perirectal, retroperitoneal, and mesenteric adenopathy as well as probable peritoneal disease most marked in the right upper quadrant extending about the right diaphragmatic leaflet.  There was pelvic and suspected vertebral osseous metastatic disease with L1 compression fracture, likely pathologic.  There was a possible 2 cm right lower lobe lung nodule.  There was left hydronephrosis and proximal ureterectasis secondary to pelvic process (prior renal ultrasound on 10/16/2019 revealed no hydronephrosis).  The patient has a history of DVT approximately 3 months ago.  IVC filter was placed on 11/07/2019.  Symptomatically, he notes significant weight loss.  He denies any bone pain.  Plan:   1.   Metastatic prostatic prostate cancer  Imaging studies reviewed and discussed with patient.  Patient has extensive pelvic, perirectal, retroperitoneal and mesenteric adenopathy.  Patient has osseous metastasis.  Bone scan and chest CT.   Patient in agreement.  Consider orthopedics consult for L1 compression fracture (likely pathologic).  Discuss plan to initiate degarelix [facility] antagonist) + abiraterone.   Potential side effects  reviewed.   Anticipate initiation of abiraterone in the outpatient department after grant obtained for medical coverage.  Patient would like me to talk to his daughter prior to initiation of treatment. 2.   Hematuria  Aspirin, Plavix, and Eliquis currently being held.  Patient s/p IVC filter placement on 11/07/2019.  Follow CBC. 3.   Normocytic anemia  Hematocrit 24.5.  Hemoglobin 7.7.  MCV 86.9 on 11/07/2019.   Etiology likely secondary to acute blood loss plus anemia of chronic renal disease.  Given baseline renal insufficiency, normal serum protein and low albumen, r/o monoclonal gammopathy.  Patient is s/p 1 unit of PRBCs.  Maintain hemoglobin 7-8.  Transfuse PRBC as needed.  Additional labs ordered.  Addendum 1:  Patient unavailable on 11/07/2019 as he was undergoing an IVC filter.  Consult performed on 11/08/2019.  Addendum 2:  I spoke with patient's daughter for 26 minutes today regarding the patient's diagnosis and treatment options.  Multiple questions asked and answered.  Thank you for allowing me to participate in [place] 's care.  I will follow him closely with you while hospitalized and after discharge in the outpatient department.   Lequita Asal, MD  11/08/2019, 5:24 AM

## 2019-11-07 NOTE — Care Management Important Message (Signed)
Important Message  Patient Details  Name: [name] MRN: [id number] Date of Birth: 1932/01/30   Medicare Important Message Given:  Yes     Dannette Barbara 11/07/2019, 12:02 PM

## 2019-11-07 NOTE — Consult Note (Signed)
[facility] Vascular Consult Note  MRN : [id number]  [name] is a 86 y.o. (1932-01-03) male who presents with chief complaint of  Chief Complaint  Patient presents with  . Hematuria   History of Present Illness:  The patient is an 86 year old male with multiple medical issues (see below) who presented to the [facility] emergency department from [place] with a chief complaint of "hematuria".  The patient is a poor historian and information for this consult was obtained through the patient, his bedside nurse, and previous epic notation.  Patient was sent to the [facility] emergency department for further evaluation after experiencing hematuria for 2 days.  The patient does have a past medical history of DVT (Dx about three weeks ago) and was currently on ASA, Plavix and Eliquis.  Patient is without complaint with the exception of what sounds like some abdominal distention.  Denies fever, nausea vomiting.  Denies shortness of breath or chest pain.   During his initial work-up in the emergency department, the patient was found to have gross hematuria.  Continuous bladder irrigation was initiated.  Anticoagulation was stopped.  CT scan of the abdomen pelvis was notable for suspected metastatic prostate cancer with nodular masses adjacent to the prostate and distal sigmoid colon.  There was left pelvic perirectal and retroperitoneal and mesenteric lymphadenopathy with probable peritoneal disease.  There is also pelvic and suspected vertebral osseous mets.  There was a possible 2 cm right lower lobe nodule.  Appropriately, the patient's anticoagulation was stopped.  Vascular surgery was consulted by Dr. Maryland Pink for possible IC filter insertion.   Current Facility-Administered Medications  Medication Dose Route Frequency Provider Last Rate Last Admin  . [START ON 11/08/2019] 0.9 %  sodium chloride infusion    Intravenous Continuous Jeanee Fabre A, PA-C      . atorvastatin (LIPITOR) tablet 80 mg  80 mg Oral Daily Tu, Ching T, DO   80 mg at 11/06/19 1727  . carvedilol (COREG) tablet 25 mg  25 mg Oral BID WC Tu, Ching T, DO   25 mg at 11/07/19 0907  . Chlorhexidine Gluconate Cloth 2 % PADS 6 each  6 each Topical Daily Tu, Ching T, DO   6 each at 11/05/19 1133  . Chlorhexidine Gluconate Cloth 2 % PADS 6 each  6 each Topical Daily Tu, Ching T, DO   6 each at 11/06/19 0849  . DULoxetine (CYMBALTA) DR capsule 60 mg  60 mg Oral Daily Tu, Ching T, DO   60 mg at 11/06/19 0848  . finasteride (PROSCAR) tablet 5 mg  5 mg Oral Daily Tu, Ching T, DO   5 mg at 11/06/19 0848  . insulin aspart (novoLOG) injection 0-15 Units  0-15 Units Subcutaneous TID WC Tu, Ching T, DO   2 Units at 11/07/19 0917  . opium-belladonna (B&O SUPPRETTES) 16.2-60 MG suppository 1 suppository  1 suppository Rectal Q8H PRN Marton Redwood III, MD      . sodium chloride irrigation 0.9 % 3,000 mL  3,000 mL Irrigation Continuous Tu, Ching T, DO   Stopped at 11/05/19 5176   Past Medical History:  Diagnosis Date  . Anemia, normocytic normochromic   . BPH (benign prostatic hyperplasia)   . CAD (coronary artery disease)   . Cataracts, bilateral   . Chronic renal insufficiency   . Colon polyp   . COPD (chronic obstructive pulmonary disease) ([place])   . Diabetes mellitus without complication ([place])   .  DVT (deep venous thrombosis) ([place])   . Edema of both legs   . Glaucoma   . Gout   . Heart murmur   . Hepatitis C antibody test positive 10/20/2019  . Hyperlipidemia   . Hypertension   . Moderate persistent asthma without complication   . Myocardial infarction ([place])   . Neuromuscular disorder ([place])   . Neuropathy   . Peripheral vascular disease ([place])   . Premature ejaculation   . Prostate cancer ([place])   . RLS (restless legs syndrome)   . Sleep apnea   . Stented coronary artery 11/13/2015  . Uncontrolled type 2 diabetes mellitus with  stage 3 chronic kidney disease, with long-term current use of insulin ([place])    Past Surgical History:  Procedure Laterality Date  . APPENDECTOMY    . [place]  2009  . COLONOSCOPY  2012  . EYE SURGERY    . PROSTATE SURGERY    . TONSILLECTOMY     Social History Social History   Tobacco Use  . Smoking status: Former Research scientist (life sciences)  . Smokeless tobacco: Never Used  Substance Use Topics  . Alcohol use: Not on file  . Drug use: Not on file   Family History History reviewed. No pertinent family history.  Denies family history of peripheral artery disease, renal disease or bleeding/clotting disorders.  Allergies  Allergen Reactions  . Metformin And Related Diarrhea    Intolerance due to naturally loose bowels   REVIEW OF SYSTEMS (Negative unless checked)  Constitutional: [] Weight loss  [] Fever  [] Chills Cardiac: [] Chest pain   [] Chest pressure   [] Palpitations   [] Shortness of breath when laying flat   [] Shortness of breath at rest   [] Shortness of breath with exertion. Vascular:  [] Pain in legs with walking   [] Pain in legs at rest   [] Pain in legs when laying flat   [] Claudication   [] Pain in feet when walking  [] Pain in feet at rest  [] Pain in feet when laying flat   [] History of DVT   [] Phlebitis   [] Swelling in legs   [] Varicose veins   [] Non-healing ulcers Pulmonary:   [] Uses home oxygen   [] Productive cough   [] Hemoptysis   [] Wheeze  [] COPD   [] Asthma Neurologic:  [] Dizziness  [] Blackouts   [] Seizures   [] History of stroke   [] History of TIA  [] Aphasia   [] Temporary blindness   [] Dysphagia   [] Weakness or numbness in arms   [] Weakness or numbness in legs Musculoskeletal:  [] Arthritis   [] Joint swelling   [] Joint pain   [] Low back pain Hematologic:  [] Easy bruising  [] Easy bleeding   [x] Hypercoagulable state   [] Anemic  [] Hepatitis Gastrointestinal:  [] Blood in stool   [] Vomiting blood  [] Gastroesophageal reflux/heartburn   [] Difficulty swallowing. Genitourinary:   [x] Chronic kidney disease   [] Difficult urination  [] Frequent urination  [] Burning with urination   [x] Blood in urine Skin:  [] Rashes   [] Ulcers   [] Wounds Psychological:  [] History of anxiety   []  History of major depression.  Physical Examination  Vitals:   11/06/19 1124 11/06/19 2010 11/07/19 0420 11/07/19 0859  BP: (!) 128/55 133/70 133/63 136/64  Pulse: 73 64 71 70  Resp: 18 20 20    Temp: 98.6 F (37 C) 98.1 F (36.7 C) 98 F (36.7 C) 98 F (36.7 C)  TempSrc: Oral Oral Oral Oral  SpO2: 98% 100% 99% 98%  Weight:      Height:       Body mass index is 35.48 kg/m.  Gen:  WD/WN, NAD Head: [place]/AT, No temporalis wasting. Prominent temp pulse not noted. Ear/Nose/Throat: Hearing grossly intact, nares w/o erythema or drainage, oropharynx w/o Erythema/Exudate Eyes: Sclera non-icteric, conjunctiva clear Neck: Trachea midline.  No JVD.  Pulmonary:  Good air movement, respirations not labored, equal bilaterally.  Cardiac: RRR, normal S1, S2. Vascular:  Vessel Right Left  Radial Palpable Palpable  Ulnar Palpable Palpable  Brachial Palpable Palpable  Carotid Palpable, without bruit Palpable, without bruit  Aorta Not palpable N/A  Femoral Palpable Palpable  Popliteal Palpable Palpable  PT Palpable Palpable  DP Palpable Palpable   Gastrointestinal: soft, non-tender/non-distended. No guarding/reflex.  GU:   CBI intact, running, clear urine Musculoskeletal: M/S 5/5 throughout.  Extremities without ischemic changes.  No deformity or atrophy. No edema. Neurologic: Sensation grossly intact in extremities.  Symmetrical.  Speech is fluent. Motor exam as listed above. Psychiatric: Judgment intact, Mood & affect appropriate for pt's clinical situation. Dermatologic: No rashes or ulcers noted.  No cellulitis or open wounds. Lymph : No Cervical, Axillary, or Inguinal lymphadenopathy.  CBC Lab Results  Component Value Date   WBC 11.2 (H) 11/07/2019   HGB 7.7 (L) 11/07/2019   HCT 24.5 (L)  11/07/2019   MCV 86.9 11/07/2019   PLT 272 11/07/2019   BMET    Component Value Date/Time   NA 138 11/07/2019 0645   K 4.4 11/07/2019 0645   CL 105 11/07/2019 0645   CO2 24 11/07/2019 0645   GLUCOSE 158 (H) 11/07/2019 0645   BUN 23 11/07/2019 0645   CREATININE 1.45 (H) 11/07/2019 0645   CALCIUM 9.3 11/07/2019 0645   GFRNONAA 44 (L) 11/07/2019 0645   GFRAA 51 (L) 11/07/2019 0645   Estimated Creatinine Clearance: 48.1 mL/min (A) (by C-G formula based on SCr of 1.45 mg/dL (H)).  COAG Lab Results  Component Value Date   INR 2.0 (H) 11/04/2019   Radiology CT HEAD WO CONTRAST  Result Date: 10/15/2019 CLINICAL DATA:  Head trauma EXAM: CT HEAD WITHOUT CONTRAST TECHNIQUE: Contiguous axial images were obtained from the base of the skull through the vertex without intravenous contrast. COMPARISON:  None. FINDINGS: Brain: No evidence of acute territorial infarction, hemorrhage, hydrocephalus,extra-axial collection or mass lesion/mass effect. There is dilatation the ventricles and sulci consistent with age-related atrophy. Low-attenuation changes in the deep white matter consistent with small vessel ischemia. Vascular: No hyperdense vessel or unexpected calcification. Skull: The skull is intact. No fracture or focal lesion identified. Sinuses/Orbits: The visualized paranasal sinuses and mastoid air cells are clear. The orbits and globes intact. Other: None IMPRESSION: No acute intracranial abnormality. Findings consistent with age related atrophy and chronic small vessel ischemia Electronically Signed   By: Prudencio Pair M.D.   On: 10/15/2019 03:49   US RENAL  Result Date: 10/16/2019 CLINICAL DATA:  Acute renal failure, chronic kidney disease stage 3. EXAM: RENAL / URINARY TRACT ULTRASOUND COMPLETE COMPARISON:  None. FINDINGS: Right Kidney: Renal measurements: 10.1 x 6.3 x 6.4 cm = volume: 215 mL. Mild increased cortical echogenicity. No mass or hydronephrosis visualized. 6.3 cm cyst over the mid  pole. Left Kidney: Renal measurements: 9.5 x 6.1 x 6.3 cm = volume: 193 mL. Mild increased cortical echogenicity. No mass or hydronephrosis visualized. Bladder: Appears normal for degree of bladder distention. Other: None. IMPRESSION: 1. Normal size kidneys without hydronephrosis. Mild increased cortical echogenicity which can be seen with medical renal disease. 2.  6.3 cm right renal cyst. Electronically Signed   By: [name] M.D.   On:  10/16/2019 09:17   DG Chest Portable 1 View  Result Date: 10/15/2019 CLINICAL DATA:  Shortness of breath, wheezing, positive CHF EXAM: PORTABLE CHEST 1 VIEW COMPARISON:  Radiograph 12/18/2018 FINDINGS: Hazy interstitial opacities more pronounced towards the lung bases with some bandlike areas of opacity likely reflecting superimposed subsegmental atelectasis and/or scarring. Mild cephalization of the pulmonary vascularity with some fissural thickening. Cardiomegaly is similar to comparison accounting for differences in technique. No visible effusion or pneumothorax. No acute osseous or soft tissue abnormality. Telemetry leads overlie the chest. IMPRESSION: Features compatible with CHF including cardiomegaly and mild interstitial edema. More bandlike opacities likely reflect subsegmental atelectasis or scarring. Electronically Signed   By: Lovena Le M.D.   On: 10/15/2019 00:06   ECHOCARDIOGRAM COMPLETE  Result Date: 10/15/2019   ECHOCARDIOGRAM REPORT   Patient Name:   KOEHN SALEHI Date of Exam: 10/15/2019 Medical Rec #:  [id number]       Height:       71.0 in Accession #:    [id number]      Weight:       275.0 lb Date of Birth:  04-Jan-1932       BSA:          2.41 m Patient Age:    45 years        BP:           140/60 mmHg Patient Gender: M               HR:           67 bpm. Exam Location:  [facility] Procedure: 2D Echo, Color Doppler and Cardiac Doppler Indications:     I50.9 Congestive Heart Failure  History:         Patient has no prior history of Echocardiogram  examinations.                  CAD and Previous Myocardial Infarction, PVD and COPD; Risk                  Factors:Diabetes, Hypertension and Dyslipidemia.  Sonographer:     Charmayne Sheer RDCS (AE) Referring Phys:  [id number] Athena Masse Diagnosing Phys: Ida Rogue MD  Sonographer Comments: Suboptimal parasternal window and no subcostal window. Pt moving throughout study due to SOB and Image acquisition challenging due to COPD. IMPRESSIONS  1. Left ventricular ejection fraction, by visual estimation, is 60 to 65%. The left ventricle has normal function. There is no left ventricular hypertrophy.  2. Left ventricular diastolic parameters are consistent with Grade I diastolic dysfunction (impaired relaxation).  3. The left ventricle has no regional wall motion abnormalities.  4. Global right ventricle has normal systolic function.The right ventricular size is normal. No increase in right ventricular wall thickness.  5. Left atrial size was mildly dilated.  6. Moderately elevated pulmonary artery systolic pressure.  7. The tricuspid regurgitant velocity is 3.12 m/s, and with an assumed right atrial pressure of 10 mmHg, the estimated right ventricular systolic pressure is moderately elevated at 48.9 mmHg. FINDINGS  Left Ventricle: Left ventricular ejection fraction, by visual estimation, is 60 to 65%. The left ventricle has normal function. The left ventricle has no regional wall motion abnormalities. There is no left ventricular hypertrophy. Left ventricular diastolic parameters are consistent with Grade I diastolic dysfunction (impaired relaxation). Normal left atrial pressure. Right Ventricle: The right ventricular size is normal. No increase in right ventricular wall thickness. Global RV systolic function is has normal systolic function. The  tricuspid regurgitant velocity is 3.12 m/s, and with an assumed right atrial pressure  of 10 mmHg, the estimated right ventricular systolic pressure is moderately elevated at  48.9 mmHg. Left Atrium: Left atrial size was mildly dilated. Right Atrium: Right atrial size was normal in size Pericardium: There is no evidence of pericardial effusion. Mitral Valve: The mitral valve is normal in structure. Mild mitral valve regurgitation. No evidence of mitral valve stenosis by observation. MV peak gradient, 4.6 mmHg. Tricuspid Valve: The tricuspid valve is normal in structure. Tricuspid valve regurgitation is not demonstrated. Aortic Valve: The aortic valve was not well visualized. Aortic valve regurgitation is not visualized. Mild aortic valve sclerosis is present, with no evidence of aortic valve stenosis. Aortic valve mean gradient measures 7.0 mmHg. Aortic valve peak gradient measures 11.6 mmHg. Aortic valve area, by VTI measures 3.20 cm. Pulmonic Valve: The pulmonic valve was normal in structure. Pulmonic valve regurgitation is not visualized. Pulmonic regurgitation is not visualized. Aorta: The aortic root, ascending aorta and aortic arch are all structurally normal, with no evidence of dilitation or obstruction. Venous: The inferior vena cava is normal in size with greater than 50% respiratory variability, suggesting right atrial pressure of 3 mmHg. IAS/Shunts: No atrial level shunt detected by color flow Doppler. There is no evidence of a patent foramen ovale. No ventricular septal defect is seen or detected. There is no evidence of an atrial septal defect.  LEFT VENTRICLE PLAX 2D LVIDd:         4.58 cm  Diastology LVIDs:         3.28 cm  LV e' lateral:   9.36 cm/s LV PW:         1.15 cm  LV E/e' lateral: 9.2 LV IVS:        0.82 cm  LV e' medial:    6.64 cm/s LVOT diam:     2.40 cm  LV E/e' medial:  13.0 LV SV:         53 ml LV SV Index:   20.73 LVOT Area:     4.52 cm  RIGHT VENTRICLE RV Basal diam:  3.04 cm LEFT ATRIUM             Index       RIGHT ATRIUM           Index LA diam:        4.40 cm 1.82 cm/m  RA Area:     12.60 cm LA Vol (A2C):   62.9 ml 26.05 ml/m RA Volume:   27.60  ml  11.43 ml/m LA Vol (A4C):   52.2 ml 21.62 ml/m LA Biplane Vol: 57.9 ml 23.98 ml/m  AORTIC VALVE                    PULMONIC VALVE AV Area (Vmax):    3.41 cm     PV Vmax:       1.04 m/s AV Area (Vmean):   3.30 cm     PV Vmean:      71.000 cm/s AV Area (VTI):     3.20 cm     PV VTI:        0.264 m AV Vmax:           170.00 cm/s  PV Peak grad:  4.3 mmHg AV Vmean:          125.000 cm/s PV Mean grad:  2.0 mmHg AV VTI:  0.401 m AV Peak Grad:      11.6 mmHg AV Mean Grad:      7.0 mmHg LVOT Vmax:         128.00 cm/s LVOT Vmean:        91.100 cm/s LVOT VTI:          0.284 m LVOT/AV VTI ratio: 0.71  AORTA Ao Root diam: 3.30 cm MITRAL VALVE                        TRICUSPID VALVE MV Area (PHT): 3.53 cm             TR Peak grad:   38.9 mmHg MV Peak grad:  4.6 mmHg             TR Vmax:        312.00 cm/s MV Mean grad:  2.0 mmHg MV Vmax:       1.07 m/s             SHUNTS MV Vmean:      68.9 cm/s            Systemic VTI:  0.28 m MV VTI:        0.38 m               Systemic Diam: 2.40 cm MV PHT:        62.35 msec MV Decel Time: 215 msec MV E velocity: 86.30 cm/s 103 cm/s MV A velocity: 94.10 cm/s 70.3 cm/s MV E/A ratio:  0.92       1.5  Ida Rogue MD Electronically signed by Ida Rogue MD Signature Date/Time: 10/15/2019/3:57:33 PM    Final    CT RENAL STONE STUDY  Result Date: 11/05/2019 CLINICAL DATA:  Hematuria of indeterminate etiology. EXAM: CT ABDOMEN AND PELVIS WITHOUT CONTRAST TECHNIQUE: Multidetector CT imaging of the abdomen and pelvis was performed following the standard protocol without IV contrast. COMPARISON:  None available FINDINGS: Lower chest: Extensive coronary calcifications. Small right pleural effusion with atelectasis/consolidation in the adjacent right lower lobe. Possible 1.5 cm subpleural nodule, lateral basal segment right lower lobe. Nodules about the right diaphragmatic leaflet. Hepatobiliary: No liver lesion or biliary ductal dilatation. Gallbladder nondilated. Pancreas:  Unremarkable. No pancreatic ductal dilatation or surrounding inflammatory changes. Spleen: Normal in size without focal abnormality. Probable accessory splenule. Adrenals/Urinary Tract: Adrenal glands unremarkable. Mild left hydronephrosis and ureterectasis down to the level of pelvic mass. 6.5 cm exophytic fluid attenuation lesion from the mid right kidney possibly cyst but incompletely characterized. Similar smaller 1.7 cm nonspecific medial right renal lesion. Urinary bladder partially decompressed by Foley catheter. There is a small amount of gas in the lumen. There is high density material in the lumen of the bladder suggesting clot. Prostate hypertrophy/mass protrudes into the lumen of the urinary bladder. Stomach/Bowel: Stomach is decompressed. Small bowel is nondistended. Moderate fecal material in the proximal colon. There is gaseous distention of the redundant sigmoid colon. This distal sigmoid colon is decompressed and surrounded by multiple retroperitoneal nodules. Rectum decompressed, unremarkable. Vascular/Lymphatic: Aortoiliac atherosclerosis (ICD10-170.0) without aneurysm. Bulky left external and common iliac and perirectal adenopathy. Subcentimeter left para-aortic and aortocaval nodes. Bulky confluent nodules/adenopathy extends from prostate along the distal sigmoid colon, with scattered nodules in the sigmoid mesocolon. Reproductive: Bulky prostate enlargement with adjacent confluent masses/aadenopathy as above. Other: Peritoneal nodules in the right upper quadrant, confluent over the dome of the liver. 1.6 cm nodule in the right pericolic gutter. Musculoskeletal: 2  cm lytic lesion in the right iliac wing. Heterogenous appearance of lower thoracic and lumbar vertebral bodies and sacrum, with probable pathologic compression deformity L1 vertebral body. IMPRESSION: 1. Suspected metastatic prostate carcinoma with confluent nodular masses adjacent to the prostate and distal sigmoid colon with left  pelvic, perirectal, retroperitoneal, and mesenteric adenopathy as well as probable peritoneal disease most marked in the right upper quadrant extending about the right diaphragmatic leaflet. 2. Pelvic and suspected vertebral osseous metastatic disease with L1 compression fracture deformity, likely pathologic. 3. Possible 2 cm right lower lobe lung nodule. 4. Left hydronephrosis and proximal ureterectasis secondary to pelvic process. Electronically Signed   By: Lucrezia Europe M.D.   On: 11/05/2019 13:20   Assessment/Plan The patient is an 86 year old male with multiple medical issues (see below) who presented to the [facility] emergency department from [place] with a chief complaint of "hematuria". 1. DVT:  Patient diagnosed with DVT and started on Eliquis approximately 3 months ago.  Presented to the [facility] emergency department with hypotension, anemia and hematuria.  Anticoagulation was stopped.  Guidelines recommend anticoagulation for approximately 6 months to treat DVT.  Due to recent diagnosis of metastatic prostate cancer, possible need for surgery, hematuria - anticoagulation will need to be stopped most likely indefinitely.  Patient is also at high risk for thromboembolic events due to recent diagnosis of metastatic cancer, sedentary lifestyle etc. Recommend placing IVC filter to protect patient from PE.  Procedure, risks and benefits explained to the patient.  All questions answered.  The patient wishes to proceed. 2.  Metastatic prostate cancer: Anticoagulation for DVT has been stopped.  Will place IVC filter.  Patient is at high risk for thrombosis of IVC filter due to metastatic disease.  We will have to monitor.  Patient is aware. 3.  Chronic kidney disease stage III: Encouraged good control of hypertension as this directly affects kidney function.  Discussed with Dr. Mayme Genta, PA-C  11/07/2019 12:04 PM  This  note was created with Dragon medical transcription system.  Any error is purely unintentional

## 2019-11-07 NOTE — H&P (Signed)
[facility] History & Physical Update  The patient was interviewed and re-examined.  The patient's previous History and Physical has been reviewed and is unchanged.  There is no change in the plan of care. We plan to proceed with the scheduled procedure.  Leotis Pain, MD  11/07/2019, 4:11 PM

## 2019-11-07 NOTE — Progress Notes (Signed)
PROGRESS NOTE  [name] BSJ:[id number] DOB: [birth year] DOA: 11/04/2019 PCP: [name], PA-C  HPI/Recap of past 24 hours: Patient is an 86 year old male with past medical history of CAD status post stent, chronic diastolic heart failure, sleep apnea, diabetes mellitus, stage III chronic kidney disease and recent DVT diagnosed 3 months ago placed on Eliquis who presented with hematuria.  Per his assisted living facility, patient had been reportedly having hematuria for the past 5 days.  In the emergency room, patient was noted to have a white count of 11.9 and a hemoglobin of 8.3, down from 9.52 weeks prior.  He was noted to have gross hematuria with clots on bladder scan noted a third of a liter.  Patient was started on continuous bladder irrigation and urology was consulted.  Urology was able to irrigate the bladder with about a liter of normal saline and then the patient was set up for continuous bladder irrigation.  He was also transfused 1 unit packed red blood cells.  His Eliquis was held.  Following transfusion, patient's hemoglobin improved to 8.6, and by this afternoon, down to 7.9.  In discussion with urology, they suspect that he has metastatic prostate cancer.  CT scan of abdomen pelvis notes multiple areas of metastases.  PSA came back on 1/18 greater than 2000.  Oncology consulted.  Vascular surgery placed IVC filter on 1/18.   Patient seen prior to IVC filter placement.  He is doing okay with no complaints, denies any pain  Assessment/Plan: Principal Problem:   Hematuria/acute blood loss anemia in patient with BPH on anticoagulation: Anticoagulation discontinued.  Being followed by urology.  Hemoglobin remaining stable for now. Active Problems:   CAD (coronary artery disease) stable.    Insulin dependent type 2 diabetes mellitus ([place]): On sliding scale.    Essential hypertension: Blood pressures have been stable to slightly on the high end.  Continue home  medications.    CKD (chronic kidney disease) stage 3, GFR 30-59 ml/min: Stable.,  At baseline.    Chronic diastolic CHF (congestive heart failure) [facility]): Monitor closely.  Given blood and IVC fluid, likely could become hypervolemic easily.  Diurese as appropriate.    Elevated LFTs  Likely metastatic prostate cancer: Really appreciate urology help.  PSA greater than 2000.  Consulted oncology who will see patient this evening.  Can discuss hormone therapy options as well as palliative measures especially if has bone metastases.  He will at some point need a bone scan    OSA on CPAP: Nightly CPAP.   Dyslipidemia: Continue statin.    Morbid obesity ([place]): Meets criteria for BMI greater than 35+ diabetes and hypertension and heart failure.    DVT (deep venous thrombosis) ([place]): Diagnosed 3 months ago.  Can no longer be on anticoagulation.  Vascular surgery placed IVC filter on 1/18   Code Status: Full code  Family Communication: Updated daughter by phone  Disposition Plan: Home after plan for prostate cancer   Consultants:  Urology  Vascular surgery  Oncology  Procedures:  Bladder irrigation done 1/15  IVC filter placement 1/18  Antimicrobials:  None  DVT prophylaxis: SCDs   Objective: Vitals:   11/07/19 1615 11/07/19 1630  BP: (!) 148/73 116/67  Pulse: 64 72  Resp: 17 18  Temp:    SpO2: 100% 99%    Intake/Output Summary (Last 24 hours) at 11/07/2019 1632 Last data filed at 11/07/2019 1200 Gross per 24 hour  Intake 15000 ml  Output 14675 ml  Net 325  ml   Filed Weights   11/04/19 1241 11/05/19 0135  Weight: 123.4 kg 115.4 kg   Body mass index is 35.48 kg/m.  Exam:   General: Awake, oriented x2, no acute distress  HEENT: Normocephalic and atraumatic, mucous membranes slightly dry  Neck: Thick, narrow airway  Cardiovascular: Regular rate and rhythm, S1-S2  Respiratory: Clear to auscultation bilaterally  Abdomen: Soft, mild distended,  nontender, hypoactive bowel sounds  Musculoskeletal: No clubbing or cyanosis, trace pitting edema bilaterally  Skin: No skin breaks, tears or lesions  Psychiatry: Appropriate, no evidence of psychoses   Data Reviewed: CBC: Recent Labs  Lab 11/04/19 1248 11/04/19 1248 11/05/19 0020 11/05/19 0623 11/05/19 1500 11/06/19 0514 11/07/19 0645  WBC 11.9*  --   --   --   --  12.1* 11.2*  NEUTROABS 9.8*  --   --   --   --   --   --   HGB 8.3*   < > 7.9* 8.6* 7.9* 7.7* 7.7*  HCT 26.1*   < > 25.2* 27.0* 24.1* 24.0* 24.5*  MCV 85.9  --   --   --   --  86.6 86.9  PLT 262  --   --   --   --  247 272   < > = values in this interval not displayed.   Basic Metabolic Panel: Recent Labs  Lab 11/04/19 1248 11/05/19 0623 11/06/19 0514 11/07/19 0645  NA 134* 135 137 138  K 4.6 4.4 4.6 4.4  CL 100 101 104 105  CO2 24 24 24 24   GLUCOSE 136* 154* 128* 158*  BUN 27* 29* 27* 23  CREATININE 1.98* 1.61* 1.62* 1.45*  CALCIUM 9.3 9.4 9.2 9.3   GFR: Estimated Creatinine Clearance: 48.1 mL/min (A) (by C-G formula based on SCr of 1.45 mg/dL (H)). Liver Function Tests: Recent Labs  Lab 11/04/19 1248 11/07/19 0645  AST 43* 23  ALT 45* 31  ALKPHOS 159* 111  BILITOT 1.1 1.0  PROT 7.0 6.2*  ALBUMIN 3.2* 2.7*   Recent Labs  Lab 11/04/19 1248  LIPASE 20   No results for input(s): AMMONIA in the last 168 hours. Coagulation Profile: Recent Labs  Lab 11/04/19 1527  INR 2.0*   Cardiac Enzymes: No results for input(s): CKTOTAL, CKMB, CKMBINDEX, TROPONINI in the last 168 hours. BNP (last 3 results) No results for input(s): PROBNP in the last 8760 hours. HbA1C: No results for input(s): HGBA1C in the last 72 hours. CBG: Recent Labs  Lab 11/06/19 1122 11/06/19 1718 11/06/19 2152 11/07/19 0903 11/07/19 1158  GLUCAP 113* 197* 169* 146* 127*   Lipid Profile: No results for input(s): CHOL, HDL, LDLCALC, TRIG, CHOLHDL, LDLDIRECT in the last 72 hours. Thyroid Function Tests: No  results for input(s): TSH, T4TOTAL, FREET4, T3FREE, THYROIDAB in the last 72 hours. Anemia Panel: No results for input(s): VITAMINB12, FOLATE, FERRITIN, TIBC, IRON, RETICCTPCT in the last 72 hours. Urine analysis:    Component Value Date/Time   COLORURINE RED (A) 11/06/2019 1039   APPEARANCEUR HAZY (A) 11/06/2019 1039   LABSPEC 1.005 11/06/2019 1039   PHURINE  11/06/2019 1039    TEST NOT REPORTED DUE TO COLOR INTERFERENCE OF URINE PIGMENT   GLUCOSEU (A) 11/06/2019 1039    TEST NOT REPORTED DUE TO COLOR INTERFERENCE OF URINE PIGMENT   HGBUR (A) 11/06/2019 1039    TEST NOT REPORTED DUE TO COLOR INTERFERENCE OF URINE PIGMENT   BILIRUBINUR (A) 11/06/2019 1039    TEST NOT REPORTED DUE TO COLOR INTERFERENCE  OF URINE PIGMENT   KETONESUR (A) 11/06/2019 1039    TEST NOT REPORTED DUE TO COLOR INTERFERENCE OF URINE PIGMENT   PROTEINUR (A) 11/06/2019 1039    TEST NOT REPORTED DUE TO COLOR INTERFERENCE OF URINE PIGMENT   NITRITE (A) 11/06/2019 1039    TEST NOT REPORTED DUE TO COLOR INTERFERENCE OF URINE PIGMENT   LEUKOCYTESUR (A) 11/06/2019 1039    TEST NOT REPORTED DUE TO COLOR INTERFERENCE OF URINE PIGMENT   Sepsis Labs: @LABRCNTIP (procalcitonin:4,lacticidven:4)  ) Recent Results (from the past 240 hour(s))  Urine culture     Status: Abnormal   Collection Time: 11/04/19  3:52 PM   Specimen: Urine, Random  Result Value Ref Range Status   Specimen Description   Final    URINE, RANDOM Performed at [facility], [address] [place], [place] [address]    Special Requests   Final    NONE Performed at [facility], [address] [place], [place] [address]    Culture (A)  Final    <10,000 COLONIES/mL INSIGNIFICANT GROWTH Performed at [facility], [place] [address] [place], [place] [address]    Report Status 11/05/2019 FINAL  Final  SARS CORONAVIRUS 2 (TAT 6-24 HRS) Nasopharyngeal Nasopharyngeal Swab     Status: None   Collection Time: 11/04/19  8:06 PM    Specimen: Nasopharyngeal Swab  Result Value Ref Range Status   SARS Coronavirus 2 NEGATIVE NEGATIVE Final    Comment: (NOTE) SARS-CoV-2 target nucleic acids are NOT DETECTED. The SARS-CoV-2 RNA is generally detectable in upper and lower respiratory specimens during the acute phase of infection. Negative results do not preclude SARS-CoV-2 infection, do not rule out co-infections with other pathogens, and should not be used as the sole basis for treatment or other patient management decisions. Negative results must be combined with clinical observations, patient history, and epidemiological information. The expected result is Negative. Fact Sheet for Patients: SugarRoll.be Fact Sheet for Healthcare Providers: https://www.woods-mathews.com/ This test is not yet approved or cleared by the [place] FDA and  has been authorized for detection and/or diagnosis of SARS-CoV-2 by FDA under an Emergency Use Authorization (EUA). This EUA will remain  in effect (meaning this test can be used) for the duration of the COVID-19 declaration under Section 56 4(b)(1) of the Act, 21 U.S.C. section 360bbb-3(b)(1), unless the authorization is terminated or revoked sooner. Performed at [facility], [place] [address] [place], [place] [address]   Urine culture     Status: None   Collection Time: 11/06/19 10:39 AM   Specimen: Urine, Random  Result Value Ref Range Status   Specimen Description   Final    URINE, RANDOM Performed at [facility], [address], [place], [place] [address]    Special Requests   Final    NONE Performed at [facility], [address] [place], [place] [address]    Culture   Final    NO GROWTH Performed at [facility], [place] [address] [place], [place] [address]    Report Status 11/07/2019 FINAL  Final      Studies: PERIPHERAL VASCULAR CATHETERIZATION  Result Date: 11/07/2019 See op  note   Scheduled Meds: . [MAR Hold] atorvastatin  80 mg Oral Daily  . [MAR Hold] carvedilol  25 mg Oral BID WC  . [MAR Hold] Chlorhexidine Gluconate Cloth  6 each Topical Daily  . [MAR Hold] Chlorhexidine Gluconate Cloth  6 each Topical Daily  . [  MAR Hold] DULoxetine  60 mg Oral Daily  . fentaNYL      . [MAR Hold] finasteride  5 mg Oral Daily  . [MAR Hold] insulin aspart  0-15 Units Subcutaneous TID WC  . midazolam        Continuous Infusions: . [START ON 11/08/2019] sodium chloride    . sodium chloride 75 mL/hr at 11/07/19 1429  . [START ON 11/08/2019]  ceFAZolin (ANCEF) IV 1 g (11/07/19 1608)  . sodium chloride irrigation Stopped (11/05/19 0035)     LOS: 3 days     Annita Brod, MD [facility]  To reach me or the doctor on call, go to: [email or web address] Password [facility]  11/07/2019, 4:32 PM

## 2019-11-07 NOTE — Progress Notes (Addendum)
   Subjective No acute issues overnight Urine pink on medium CBI  Physical Exam: BP 133/63 (BP Location: Left Arm)   Pulse 71   Temp 98 F (36.7 C) (Oral)   Resp 20   Ht 5\' 11"  (1.803 m)   Wt 115.4 kg   SpO2 99%   BMI 35.48 kg/m    Constitutional:  Alert and oriented, No acute distress. Respiratory: Normal respiratory effort, no increased work of breathing. GI: Abdomen is soft, non-tender, non-distended Drains: 20 French three-way Foley with pink urine  The catheter was irrigated with 500 cc normal saline with return of no clots.  Urine remained light pink, and was reconnected to slow CBI.  Laboratory Data: Reviewed PSA > 2000  Pertinent Imaging: Reviewed, findings concerning for metastatic prostate cancer  Assessment & Plan:   The patient is an 86 year old comorbid male on multiple anticoagulants who presented with gross hematuria and on CT was found to have suspected metastatic prostate cancer with PSA greater than 2000.  Urine remains light pink on medium CBI.  -Continue slow CBI today, okay to wean if urine is clear to faint pink.  No plans for operative intervention from urology perspective today. -Continue to hold anticoagulants -Recommend oncology consultation for suspected metastatic prostate cancer, and likely ADT with Firmagon to prevent flare -Primary team planning IVC filter today, okay for diet from urology perspective after that procedure  I updated his daughter [name] about his complex medical situation, likely diagnosis of metastatic prostate cancer, need for IVC filter, and further treatment strategies pending oncology consultation, but likely hormone treatment with ADT and possible chemotherapy or other antiandrogens per oncology recommendations.   Billey Co, MD

## 2019-11-08 ENCOUNTER — Inpatient Hospital Stay: Payer: Medicare Other

## 2019-11-08 ENCOUNTER — Other Ambulatory Visit: Payer: Self-pay | Admitting: Hematology and Oncology

## 2019-11-08 ENCOUNTER — Encounter: Payer: Self-pay | Admitting: Cardiology

## 2019-11-08 DIAGNOSIS — C61 Malignant neoplasm of prostate: Principal | ICD-10-CM

## 2019-11-08 DIAGNOSIS — Z87891 Personal history of nicotine dependence: Secondary | ICD-10-CM

## 2019-11-08 DIAGNOSIS — C7951 Secondary malignant neoplasm of bone: Secondary | ICD-10-CM

## 2019-11-08 DIAGNOSIS — D649 Anemia, unspecified: Secondary | ICD-10-CM

## 2019-11-08 DIAGNOSIS — R310 Gross hematuria: Secondary | ICD-10-CM

## 2019-11-08 LAB — GLUCOSE, CAPILLARY
Glucose-Capillary: 161 mg/dL — ABNORMAL HIGH (ref 70–99)
Glucose-Capillary: 162 mg/dL — ABNORMAL HIGH (ref 70–99)
Glucose-Capillary: 136 mg/dL — ABNORMAL HIGH (ref 70–99)
Glucose-Capillary: 161 mg/dL — ABNORMAL HIGH (ref 70–99)

## 2019-11-08 LAB — CBC
HCT: 24.8 % — ABNORMAL LOW (ref 39.0–52.0)
Hemoglobin: 7.8 g/dL — ABNORMAL LOW (ref 13.0–17.0)
MCH: 27.7 pg (ref 26.0–34.0)
MCHC: 31.5 g/dL (ref 30.0–36.0)
MCV: 87.9 fL (ref 80.0–100.0)
Platelets: 285 10*3/uL (ref 150–400)
RBC: 2.82 MIL/uL — ABNORMAL LOW (ref 4.22–5.81)
RDW: 15 % (ref 11.5–15.5)
WBC: 10.1 10*3/uL (ref 4.0–10.5)
nRBC: 0 % (ref 0.0–0.2)

## 2019-11-08 LAB — FERRITIN: Ferritin: 1454 ng/mL — ABNORMAL HIGH (ref 24–336)

## 2019-11-08 LAB — IRON AND TIBC
Iron: 27 ug/dL — ABNORMAL LOW (ref 45–182)
Saturation Ratios: 20 % (ref 17.9–39.5)
TIBC: 139 ug/dL — ABNORMAL LOW (ref 250–450)
UIBC: 112 ug/dL

## 2019-11-08 LAB — RETICULOCYTES
Immature Retic Fract: 29 % — ABNORMAL HIGH (ref 2.3–15.9)
RBC.: 2.79 MIL/uL — ABNORMAL LOW (ref 4.22–5.81)
Retic Count, Absolute: 77.8 10*3/uL (ref 19.0–186.0)
Retic Ct Pct: 2.8 % (ref 0.4–3.1)

## 2019-11-08 LAB — VITAMIN B12: Vitamin B-12: 563 pg/mL (ref 180–914)

## 2019-11-08 LAB — FOLATE: Folate: 7.7 ng/mL (ref >5.9)

## 2019-11-08 MED ORDER — TECHNETIUM TC 99M MEDRONATE IV KIT
20.0000 | PACK | Freq: Once | INTRAVENOUS | Status: AC | PRN
  Administered 2019-11-08: 20.48 via INTRAVENOUS

## 2019-11-08 NOTE — Progress Notes (Signed)
PT Cancellation Note  Patient Details Name: [name] MRN: [id number] DOB: Jan 15, 1932   Cancelled Treatment:    Reason Eval/Treat Not Completed: Patient at procedure or test/unavailable(Consult received and chart reviewed.  Patient currently off unit for diagnostic testing.  Additionally, pending ortho consult for possible kypoplasty.  Will continue to follow and initiate as medically appropriate.)   Diquan Kassis H. Owens Shark, PT, DPT, NCS 11/08/19, 3:35 PM [phone]

## 2019-11-08 NOTE — Progress Notes (Signed)
[facility] Daily Progress Note   Subjective: 1 Day Post-Op: 1.   Ultrasound guidance for vascular access to the right vein 2.   Catheter placement into the inferior vena cava 3.   Inferior venacavogram 4.   Placement of a Bard [place] IVC filter  Patient without complaint this AM. No issues overnight.   Objective: Vitals:   11/07/19 1645 11/07/19 1702 11/07/19 1949 11/08/19 0425  BP: 136/63 (!) 140/53 (!) 139/59 (!) 158/75  Pulse:  64 67 71  Resp: 18 16 20 20   Temp:   97.8 F (36.6 C) 98.1 F (36.7 C)  TempSrc:   Oral Oral  SpO2:  100% 100% 100%  Weight:      Height:        Intake/Output Summary (Last 24 hours) at 11/08/2019 1034 Last data filed at 11/08/2019 0949 Gross per 24 hour  Intake 24500 ml  Output 19250 ml  Net 5250 ml   Physical Exam: A&Ox2, NAD CV: RRR Pulmonary: CTA Bilaterally Abdomen: Soft, Nontender, Nondistended Right Groin:  Access Site: Clean, dry and intact. No swelling or drainage noted.  Vascular:   Extremities: warm, non-tender, minimal edema,    Laboratory: CBC    Component Value Date/Time   WBC 10.1 11/08/2019 0520   HGB 7.8 (L) 11/08/2019 0520   HCT 24.8 (L) 11/08/2019 0520   PLT 285 11/08/2019 0520   BMET    Component Value Date/Time   NA 138 11/07/2019 0645   K 4.4 11/07/2019 0645   CL 105 11/07/2019 0645   CO2 24 11/07/2019 0645   GLUCOSE 158 (H) 11/07/2019 0645   BUN 23 11/07/2019 0645   CREATININE 1.45 (H) 11/07/2019 0645   CALCIUM 9.3 11/07/2019 0645   GFRNONAA 44 (L) 11/07/2019 0645   GFRAA 51 (L) 11/07/2019 0645   Assessment/Planning: The patient is an 86 year old male with multiple medical issues including possible metastatic prostate cancer diagnosed with DVT and put on Eliquis approximately 3 months ago who presented to the [facility] emergency department with hematuria.  Due to recent diagnosis of possible metastatic cancer anticoagulation had to be stopped and an IVC  filter was placed - POD#1  1) DVT: Diagnosed approximately 3 months ago.  Unable to continue oral anticoagulation due to recent diagnosis of possible metastatic cancer / hematuria. Will see in the outpatient setting to monitor.  2) IVC Filter: Patient is at high risk for thrombosis of IVC filter due to metastatic disease.  Recommend the initiation of aspirin 81 mg daily if okay.  Discussed with Dr. Ellis Parents Jamian Andujo PA-C 11/08/2019 10:34 AM

## 2019-11-08 NOTE — Progress Notes (Signed)
OT Cancellation Note  Patient Details Name: [name] MRN: [id number] DOB: 17-Jan-1932   Cancelled Treatment:    Reason Eval/Treat Not Completed: Patient at procedure or test/ unavailable. Order received and chart reviewed. Upon arrival to unit, pt primary RN notified this Pryor Curia that pt is currently OTF for procedure. Will hold OT evaluation at this time and re-attempt at a later time/date as available and pt medically appropriate for OT evaluation.  Shara Blazing, M.S., OTR/L Ascom: [phone] 11/08/19, 3:03 PM

## 2019-11-08 NOTE — Consult Note (Signed)
Reason for Consult: L1 compression fracture, pathologic Referring Physician: Dr. Yolonda Kida is an 86 y.o. male.  HPI: Patient is an 86 year old with metastatic prostate cancer.  Scans have shown lytic lesions that T2 and L1 with compression fracture at L1.  He has been minimally ambulatory for the past 2 months.  He complains of diffuse pain but not particularly the back and his daughter reports he is never really complained about his back.  He currently is admitted with hematuria and the CT of the pelvis showed this with subsequent bone scan showing multiple diffuse disease.  It did not light up specifically at L1 although there is increased uptake there today.  Past Medical History:  Diagnosis Date  . Anemia, normocytic normochromic   . BPH (benign prostatic hyperplasia)   . CAD (coronary artery disease)   . Cataracts, bilateral   . Chronic renal insufficiency   . Colon polyp   . COPD (chronic obstructive pulmonary disease) ([place])   . Diabetes mellitus without complication ([place])   . DVT (deep venous thrombosis) ([place])   . Edema of both legs   . Glaucoma   . Gout   . Heart murmur   . Hepatitis C antibody test positive 10/20/2019  . Hyperlipidemia   . Hypertension   . Moderate persistent asthma without complication   . Myocardial infarction ([place])   . Neuromuscular disorder ([place])   . Neuropathy   . Peripheral vascular disease ([place])   . Premature ejaculation   . Prostate cancer ([place])   . RLS (restless legs syndrome)   . Sleep apnea   . Stented coronary artery 11/13/2015  . Uncontrolled type 2 diabetes mellitus with stage 3 chronic kidney disease, with long-term current use of insulin ([place])     Past Surgical History:  Procedure Laterality Date  . APPENDECTOMY    . [place]  2009  . COLONOSCOPY  2012  . EYE SURGERY    . IVC FILTER INSERTION N/A 11/07/2019   Procedure: IVC FILTER INSERTION;  Surgeon: Algernon Huxley, MD;  Location: [facility];   Service: Cardiovascular;  Laterality: N/A;  . PROSTATE SURGERY    . TONSILLECTOMY      History reviewed. No pertinent family history.  Social History:  reports that he has quit smoking. He has never used smokeless tobacco. No history on file for alcohol and drug.  Allergies:  Allergies  Allergen Reactions  . Metformin And Related Diarrhea    Intolerance due to naturally loose bowels    Medications: I have reviewed the patient's current medications.  Results for orders placed or performed during the hospital encounter of 11/04/19 (from the past 48 hour(s))  Glucose, capillary     Status: Abnormal   Collection Time: 11/06/19  5:18 PM  Result Value Ref Range   Glucose-Capillary 197 (H) 70 - 99 mg/dL  Glucose, capillary     Status: Abnormal   Collection Time: 11/06/19  9:52 PM  Result Value Ref Range   Glucose-Capillary 169 (H) 70 - 99 mg/dL  amcmet     Status: Abnormal   Collection Time: 11/07/19  6:45 AM  Result Value Ref Range   Sodium 138 135 - 145 mmol/L   Potassium 4.4 3.5 - 5.1 mmol/L   Chloride 105 98 - 111 mmol/L   CO2 24 22 - 32 mmol/L   Glucose, Bld 158 (H) 70 - 99 mg/dL   BUN 23 8 - 23 mg/dL   Creatinine, Ser 1.45 (H)  0.61 - 1.24 mg/dL   Calcium 9.3 8.9 - 10.3 mg/dL   Total Protein 6.2 (L) 6.5 - 8.1 g/dL   Albumin 2.7 (L) 3.5 - 5.0 g/dL   AST 23 15 - 41 U/L   ALT 31 0 - 44 U/L   Alkaline Phosphatase 111 38 - 126 U/L   Total Bilirubin 1.0 0.3 - 1.2 mg/dL   GFR calc non Af Amer 44 (L) >60 mL/min   GFR calc Af Amer 51 (L) >60 mL/min   Anion gap 9 5 - 15    Comment: Performed at [facility], [address] [place], [place] [address]  amcbc     Status: Abnormal   Collection Time: 11/07/19  6:45 AM  Result Value Ref Range   WBC 11.2 (H) 4.0 - 10.5 K/uL   RBC 2.82 (L) 4.22 - 5.81 MIL/uL   Hemoglobin 7.7 (L) 13.0 - 17.0 g/dL   HCT 24.5 (L) 39.0 - 52.0 %   MCV 86.9 80.0 - 100.0 fL   MCH 27.3 26.0 - 34.0 pg   MCHC 31.4 30.0 - 36.0 g/dL   RDW 14.8  11.5 - 15.5 %   Platelets 272 150 - 400 K/uL   nRBC 0.0 0.0 - 0.2 %    Comment: Performed at [facility], [address] [place], [place] [address]  Glucose, capillary     Status: Abnormal   Collection Time: 11/07/19  9:03 AM  Result Value Ref Range   Glucose-Capillary 146 (H) 70 - 99 mg/dL  Glucose, capillary     Status: Abnormal   Collection Time: 11/07/19 11:58 AM  Result Value Ref Range   Glucose-Capillary 127 (H) 70 - 99 mg/dL   Comment 1 Notify RN    Comment 2 Document in Chart   Glucose, capillary     Status: Abnormal   Collection Time: 11/07/19  4:33 PM  Result Value Ref Range   Glucose-Capillary 146 (H) 70 - 99 mg/dL  Glucose, capillary     Status: Abnormal   Collection Time: 11/07/19  5:27 PM  Result Value Ref Range   Glucose-Capillary 137 (H) 70 - 99 mg/dL   Comment 1 Notify RN    Comment 2 Document in Chart   Glucose, capillary     Status: Abnormal   Collection Time: 11/07/19  9:05 PM  Result Value Ref Range   Glucose-Capillary 161 (H) 70 - 99 mg/dL   Comment 1 Notify RN   CBC     Status: Abnormal   Collection Time: 11/08/19  5:20 AM  Result Value Ref Range   WBC 10.1 4.0 - 10.5 K/uL   RBC 2.82 (L) 4.22 - 5.81 MIL/uL   Hemoglobin 7.8 (L) 13.0 - 17.0 g/dL   HCT 24.8 (L) 39.0 - 52.0 %   MCV 87.9 80.0 - 100.0 fL   MCH 27.7 26.0 - 34.0 pg   MCHC 31.5 30.0 - 36.0 g/dL   RDW 15.0 11.5 - 15.5 %   Platelets 285 150 - 400 K/uL   nRBC 0.0 0.0 - 0.2 %    Comment: Performed at [facility], [address] [place], [place] [address]  Ferritin     Status: Abnormal   Collection Time: 11/08/19  5:20 AM  Result Value Ref Range   Ferritin 1,454 (H) 24 - 336 ng/mL    Comment: Performed at [facility], [address], [place], [place] [address]  Iron and TIBC     Status: Abnormal  Collection Time: 11/08/19  5:20 AM  Result Value Ref Range   Iron 27 (L) 45 - 182 ug/dL   TIBC 139 (L) 250 - 450 ug/dL   Saturation Ratios 20 17.9 - 39.5 %    UIBC 112 ug/dL    Comment: Performed at [facility], [address] [place], [place] [address]  Vitamin B12     Status: None   Collection Time: 11/08/19  5:20 AM  Result Value Ref Range   Vitamin B-12 563 180 - 914 pg/mL    Comment: (NOTE) This assay is not validated for testing neonatal or myeloproliferative syndrome specimens for Vitamin B12 levels. Performed at [facility], [place] [address] [place], [place] [address]   Folate     Status: None   Collection Time: 11/08/19  5:20 AM  Result Value Ref Range   Folate 7.7 >5.9 ng/mL    Comment: Performed at [facility], [address] [place], [place] [address]  Reticulocytes     Status: Abnormal   Collection Time: 11/08/19  5:20 AM  Result Value Ref Range   Retic Ct Pct 2.8 0.4 - 3.1 %   RBC. 2.79 (L) 4.22 - 5.81 MIL/uL   Retic Count, Absolute 77.8 19.0 - 186.0 K/uL   Immature Retic Fract 29.0 (H) 2.3 - 15.9 %    Comment: Performed at [facility], [address] [place], [place] [address]  Glucose, capillary     Status: Abnormal   Collection Time: 11/08/19  7:52 AM  Result Value Ref Range   Glucose-Capillary 161 (H) 70 - 99 mg/dL   Comment 1 Notify RN   Glucose, capillary     Status: Abnormal   Collection Time: 11/08/19 11:52 AM  Result Value Ref Range   Glucose-Capillary 162 (H) 70 - 99 mg/dL   Comment 1 Notify RN     CT CHEST WO CONTRAST  Result Date: 11/08/2019 CLINICAL DATA:  Metastatic prostate cancer. Recently admitted for multiple falls with hematuria. Abnormal CT stone study, with findings suspicious for thoracic metastasis. EXAM: CT CHEST WITHOUT CONTRAST TECHNIQUE: Multidetector CT imaging of the chest was performed following the standard protocol without IV contrast. COMPARISON:  Abdominopelvic CT of 11/05/2019 FINDINGS: Cardiovascular: Aortic atherosclerosis. Tortuous thoracic aorta. Mild cardiomegaly, without pericardial effusion. Multivessel coronary artery atherosclerosis.  Pulmonary artery enlargement, outflow tract 3.2 cm Mediastinum/Nodes: No mediastinal or definite hilar adenopathy, given limitations of unenhanced CT. Lungs/Pleura: Small right pleural effusion. Nodularity along the anterior right hemidiaphragm, including on coronal image 35. Example lesion at 2.7 x 2.1 cm on 105/2. Dependent right lower lobe subsegmental atelectasis. Pleural-based 8 mm density in the superior segment left lower lobe is favored to represent scarring or atelectasis on 51/3. Upper Abdomen: Deferred to recent abdominal CT. No acute superimposed process. Musculoskeletal: Moderate left and mild right gynecomastia. Lytic lesion involving the right side of the T2 vertebral body, including posterior elements. Adjacent medial right second rib lucency, including on 30/2. L1 mild-to-moderate compression deformity with underlying lytic lesion. IMPRESSION: 1. Right pleural effusion with pleural metastasis. 2. Areas of bilateral atelectasis, without pulmonary metastasis. 3. Osseous metastasis, including a dominant lesion involving the right-side of the L2 vertebral body. Consider pre and postcontrast thoracic spine MRI for further evaluation. 4. Pulmonary artery enlargement suggests pulmonary arterial hypertension. 5. Aortic atherosclerosis (ICD10-I70.0) and emphysema (ICD10-J43.9). 6. Bilateral gynecomastia. Electronically Signed   By: Abigail Miyamoto M.D.   On: 11/08/2019 15:30   NM Bone Scan Whole Body  Result  Date: 11/08/2019 CLINICAL DATA:  Metastatic prostate cancer. EXAM: NUCLEAR MEDICINE WHOLE BODY BONE SCAN TECHNIQUE: Whole body anterior and posterior images were obtained approximately 3 hours after intravenous injection of radiopharmaceutical. RADIOPHARMACEUTICALS:  20.5 mCi Technetium-26m MDP IV COMPARISON:  CT 11/05/2019. FINDINGS: Findings suggest left hydronephrosis and left hydroureter as noted on prior CT scan of 11/05/2019. Foley catheter noted. Multiple bony lesions are noted. Lesions noted in  the right occiput, clavicles, left upper extremity, sternum, ribs bilaterally, throughout the cervical, thoracic, lumbar spine, bony pelvis, proximal femurs, right tibia. These findings are consistent with widespread metastatic disease. IMPRESSION: 1. Findings consistent with widespread bony metastatic disease. 2. Left hydronephrosis and hydroureter. Electronically Signed   By: Marcello Moores  Register   On: 11/08/2019 14:55   PERIPHERAL VASCULAR CATHETERIZATION  Result Date: 11/07/2019 See op note   Review of Systems Blood pressure 140/61, pulse 65, temperature 97.9 F (36.6 C), temperature source Oral, resp. rate 15, height 5\' 11"  (1.803 m), weight 115.4 kg, SpO2 100 %. Physical Exam He is unable to stand.  He is able flex extend his ankles and has no clonus.  He is able to roll over onto his side and he does have a palpable kyphotic deformity at L1 that is nontender to percussion Prior CT scan and bone scan reviewed Assessment/Plan: Pathologic L1 fracture minimally symptomatic Plan is for observation unless it becomes more symptomatic or if there is a need for tissue diagnosis or deterioration of neurologic status.  I will continue to follow with him and talk to his daughter about the kyphoplasty procedure so that if he does worsen with increased pain she would understand the procedure.  Hessie Knows 11/08/2019, 4:12 PM

## 2019-11-08 NOTE — Progress Notes (Addendum)
Urology Inpatient Progress Note  Subjective: [name] is a 86 y.o. comorbid male admitted on 11/04/2019 with gross hematuria x5 days on multiple anticoagulants, now holding anticoagulation and on CBI.  PSA >2000 with CT findings suggestive of metastatic prostate cancer.  Urine light pink today on slow drip CBI.  He denies pain today, no acute concerns.  Anti-infectives: Anti-infectives (From admission, onward)   Start     Dose/Rate Route Frequency Ordered Stop   11/08/19 0000  ceFAZolin (ANCEF) IVPB 1 g/50 mL premix    Note to Pharmacy: To be given in specials   1 g 100 mL/hr over 30 Minutes Intravenous  Once 11/07/19 1428 11/07/19 1723      Current Facility-Administered Medications  Medication Dose Route Frequency Provider Last Rate Last Admin  . 0.9 %  sodium chloride infusion   Intravenous Continuous Algernon Huxley, MD 75 mL/hr at 11/08/19 0150 New Bag at 11/08/19 0150  . atorvastatin (LIPITOR) tablet 80 mg  80 mg Oral Daily Algernon Huxley, MD   80 mg at 11/07/19 2150  . carvedilol (COREG) tablet 25 mg  25 mg Oral BID WC Algernon Huxley, MD   25 mg at 11/08/19 2694  . Chlorhexidine Gluconate Cloth 2 % PADS 6 each  6 each Topical Daily Algernon Huxley, MD   6 each at 11/07/19 1200  . Chlorhexidine Gluconate Cloth 2 % PADS 6 each  6 each Topical Daily Algernon Huxley, MD   6 each at 11/07/19 1200  . DULoxetine (CYMBALTA) DR capsule 60 mg  60 mg Oral Daily Algernon Huxley, MD   60 mg at 11/06/19 0848  . finasteride (PROSCAR) tablet 5 mg  5 mg Oral Daily Algernon Huxley, MD   5 mg at 11/06/19 0848  . HYDROmorphone (DILAUDID) injection 1 mg  1 mg Intravenous Once PRN Algernon Huxley, MD      . insulin aspart (novoLOG) injection 0-15 Units  0-15 Units Subcutaneous TID WC Algernon Huxley, MD   3 Units at 11/08/19 0809  . ondansetron (ZOFRAN) injection 4 mg  4 mg Intravenous Q6H PRN Algernon Huxley, MD      . opium-belladonna (B&O SUPPRETTES) 16.2-60 MG suppository 1 suppository  1 suppository Rectal Q8H PRN  Algernon Huxley, MD      . sodium chloride irrigation 0.9 % 3,000 mL  3,000 mL Irrigation Continuous Algernon Huxley, MD   Stopped at 11/05/19 0035     Objective: Vital signs in last 24 hours: Temp:  [97.8 F (36.6 C)-98.2 F (36.8 C)] 98.1 F (36.7 C) (01/19 0425) Pulse Rate:  [64-72] 71 (01/19 0425) Resp:  [16-20] 20 (01/19 0425) BP: (116-158)/(53-79) 158/75 (01/19 0425) SpO2:  [98 %-100 %] 100 % (01/19 0425)  Intake/Output from previous day: 01/18 0701 - 01/19 0700 In: 18500  Out: 12500 [Urine:12500] Intake/Output this shift: Total I/O In: 3000 [Other:3000] Out: 3250 [Urine:3250]  Physical Exam Vitals and nursing note reviewed.  Constitutional:      General: He is not in acute distress.    Appearance: He is not ill-appearing, toxic-appearing or diaphoretic.  HENT:     Head: Normocephalic and atraumatic.  Pulmonary:     Effort: Pulmonary effort is normal. No respiratory distress.  Skin:    General: Skin is warm and dry.  Neurological:     Mental Status: He is easily aroused.    Lab Results:  Recent Labs    11/07/19 0645 11/08/19 0520  WBC 11.2*  10.1  HGB 7.7* 7.8*  HCT 24.5* 24.8*  PLT 272 285   BMET Recent Labs    11/06/19 0514 11/07/19 0645  NA 137 138  K 4.6 4.4  CL 104 105  CO2 24 24  GLUCOSE 128* 158*  BUN 27* 23  CREATININE 1.62* 1.45*  CALCIUM 9.2 9.3   Assessment & Plan: 86 year old comorbid male admitted with gross hematuria on anticoagulation, now holding anticoagulants s/p IVC filter placement and on CBI.  CT findings concerning for metastatic prostate cancer, appreciate oncology consult. Urine light pink on slow CBI.  -Continue to hold anticoagulants -May wean off CBI today if urine remains clear to light pink  Debroah [place], PA-C 11/08/2019

## 2019-11-08 NOTE — Progress Notes (Addendum)
PROGRESS NOTE  [name] SWF:[id number] DOB: 1932-01-29 DOA: 11/04/2019 PCP: [name], PA-C  HPI/Recap of past 24 hours: Patient is an 86 year old male with past medical history of CAD status post stent, chronic diastolic heart failure, sleep apnea, diabetes mellitus, stage III chronic kidney disease and recent DVT diagnosed 3 months ago placed on Eliquis who presented with hematuria.  Per his assisted living facility, patient had been reportedly having hematuria for the past 5 days.  In the emergency room, patient was noted to have a white count of 11.9 and a hemoglobin of 8.3, down from 9.52 weeks prior.  He was noted to have gross hematuria with clots on bladder scan noted a third of a liter.  Patient was started on continuous bladder irrigation and urology was consulted.  Urology was able to irrigate the bladder with about a liter of normal saline and then the patient was set up for continuous bladder irrigation.  He was also transfused 1 unit packed red blood cells.  His Eliquis was held.  Following transfusion, patient's hemoglobin improved to 8.6, and by this afternoon, down to 7.9.  In discussion with urology, they suspect that he has metastatic prostate cancer.  CT scan of abdomen pelvis notes multiple areas of metastases.  PSA came back on 1/18 greater than 2000.  Oncology consulted & and have ordered CT scan of chest which is pending as well as bone scan which shows diffuse bone metastases.  Vascular surgery placed IVC filter on 1/18.  Orthopedics consulted for possible vertebroplasty at L1 site for pathologic fracture.  Patient surprisingly doing okay with no complaints.  Denies any pain.  Assessment/Plan: Principal Problem:   Hematuria/acute blood loss anemia in patient with BPH on anticoagulation: Anticoagulation discontinued.  Being followed by urology.  Hemoglobin remaining stable for now. Active Problems:   CAD (coronary artery disease) stable.    Insulin dependent  type 2 diabetes mellitus ([place]): On sliding scale.    Essential hypertension: Blood pressures have been stable to slightly on the high end.  Continue home medications.    CKD (chronic kidney disease) stage 3a, GFR 30-59 ml/min: Stable.,  At baseline.    Chronic diastolic CHF (congestive heart failure) [facility]): Monitor closely.  Given blood and IVC fluid, likely could become hypervolemic easily.  Diurese as appropriate.    Elevated LFTs  Metastatic prostate cancer with widespread metastases to bone as well as liver: Really appreciate urology help.  PSA greater than 2000.  Oncology discussing treatment options.  Waiting for CT scan of chest.  Waiting for oncology to weigh in on possible vertebroplasty at L1.  The patient's wife passed away in the last year from pancreatic cancer.  Discussed with daughter.  She used hospice at the end.  This may be an option for this patient    OSA on CPAP: Nightly CPAP.   Dyslipidemia: Continue statin.    Morbid obesity ([place]): Meets criteria for BMI greater than 35+ diabetes and hypertension and heart failure.    DVT (deep venous thrombosis) ([place]): Diagnosed 3 months ago.  Can no longer be on anticoagulation.  Vascular surgery placed IVC filter on 1/18   Code Status: Full code  Family Communication: Updated daughter by phone  Disposition Plan: Home after orthopedic weigh in and oncology plan in place   Consultants:  Urology  Vascular surgery  Oncology  Orthopedics  Procedures:  Bladder irrigation done 1/15  IVC filter placement 1/18  Antimicrobials:  None  DVT prophylaxis: SCDs  Objective: Vitals:   11/08/19 0425 11/08/19 1152  BP: (!) 158/75 140/61  Pulse: 71 65  Resp: 20 15  Temp: 98.1 F (36.7 C) 97.9 F (36.6 C)  SpO2: 100% 100%    Intake/Output Summary (Last 24 hours) at 11/08/2019 1515 Last data filed at 11/08/2019 0949 Gross per 24 hour  Intake 18500 ml  Output 19250 ml  Net -750 ml   Filed Weights   11/04/19  1241 11/05/19 0135  Weight: 123.4 kg 115.4 kg   Body mass index is 35.48 kg/m.  Exam:   General: Awake, oriented x2, no acute distress  HEENT: Normocephalic and atraumatic, mucous membranes slightly dry  Neck: Thick, narrow airway  Cardiovascular: Regular rate and rhythm, S1-S2  Respiratory: Clear to auscultation bilaterally  Abdomen: Soft, mild distended, nontender, hypoactive bowel sounds  Musculoskeletal: No clubbing or cyanosis, trace pitting edema bilaterally  Skin: No skin breaks, tears or lesions  Psychiatry: Appropriate, no evidence of psychoses   Data Reviewed: CBC: Recent Labs  Lab 11/04/19 1248 11/05/19 0020 11/05/19 0623 11/05/19 1500 11/06/19 0514 11/07/19 0645 11/08/19 0520  WBC 11.9*  --   --   --  12.1* 11.2* 10.1  NEUTROABS 9.8*  --   --   --   --   --   --   HGB 8.3*   < > 8.6* 7.9* 7.7* 7.7* 7.8*  HCT 26.1*   < > 27.0* 24.1* 24.0* 24.5* 24.8*  MCV 85.9  --   --   --  86.6 86.9 87.9  PLT 262  --   --   --  247 272 285   < > = values in this interval not displayed.   Basic Metabolic Panel: Recent Labs  Lab 11/04/19 1248 11/05/19 0623 11/06/19 0514 11/07/19 0645  NA 134* 135 137 138  K 4.6 4.4 4.6 4.4  CL 100 101 104 105  CO2 24 24 24 24   GLUCOSE 136* 154* 128* 158*  BUN 27* 29* 27* 23  CREATININE 1.98* 1.61* 1.62* 1.45*  CALCIUM 9.3 9.4 9.2 9.3   GFR: Estimated Creatinine Clearance: 48.1 mL/min (A) (by C-G formula based on SCr of 1.45 mg/dL (H)). Liver Function Tests: Recent Labs  Lab 11/04/19 1248 11/07/19 0645  AST 43* 23  ALT 45* 31  ALKPHOS 159* 111  BILITOT 1.1 1.0  PROT 7.0 6.2*  ALBUMIN 3.2* 2.7*   Recent Labs  Lab 11/04/19 1248  LIPASE 20   No results for input(s): AMMONIA in the last 168 hours. Coagulation Profile: Recent Labs  Lab 11/04/19 1527  INR 2.0*   Cardiac Enzymes: No results for input(s): CKTOTAL, CKMB, CKMBINDEX, TROPONINI in the last 168 hours. BNP (last 3 results) No results for  input(s): PROBNP in the last 8760 hours. HbA1C: No results for input(s): HGBA1C in the last 72 hours. CBG: Recent Labs  Lab 11/07/19 1633 11/07/19 1727 11/07/19 2105 11/08/19 0752 11/08/19 1152  GLUCAP 146* 137* 161* 161* 162*   Lipid Profile: No results for input(s): CHOL, HDL, LDLCALC, TRIG, CHOLHDL, LDLDIRECT in the last 72 hours. Thyroid Function Tests: No results for input(s): TSH, T4TOTAL, FREET4, T3FREE, THYROIDAB in the last 72 hours. Anemia Panel: Recent Labs    11/08/19 0520  VITAMINB12 563  FOLATE 7.7  FERRITIN 1,454*  TIBC 139*  IRON 27*  RETICCTPCT 2.8   Urine analysis:    Component Value Date/Time   COLORURINE RED (A) 11/06/2019 1039   APPEARANCEUR HAZY (A) 11/06/2019 1039   LABSPEC 1.005 11/06/2019 1039  PHURINE  11/06/2019 1039    TEST NOT REPORTED DUE TO COLOR INTERFERENCE OF URINE PIGMENT   GLUCOSEU (A) 11/06/2019 1039    TEST NOT REPORTED DUE TO COLOR INTERFERENCE OF URINE PIGMENT   HGBUR (A) 11/06/2019 1039    TEST NOT REPORTED DUE TO COLOR INTERFERENCE OF URINE PIGMENT   BILIRUBINUR (A) 11/06/2019 1039    TEST NOT REPORTED DUE TO COLOR INTERFERENCE OF URINE PIGMENT   KETONESUR (A) 11/06/2019 1039    TEST NOT REPORTED DUE TO COLOR INTERFERENCE OF URINE PIGMENT   PROTEINUR (A) 11/06/2019 1039    TEST NOT REPORTED DUE TO COLOR INTERFERENCE OF URINE PIGMENT   NITRITE (A) 11/06/2019 1039    TEST NOT REPORTED DUE TO COLOR INTERFERENCE OF URINE PIGMENT   LEUKOCYTESUR (A) 11/06/2019 1039    TEST NOT REPORTED DUE TO COLOR INTERFERENCE OF URINE PIGMENT   Sepsis Labs: @LABRCNTIP (procalcitonin:4,lacticidven:4)  ) Recent Results (from the past 240 hour(s))  Urine culture     Status: Abnormal   Collection Time: 11/04/19  3:52 PM   Specimen: Urine, Random  Result Value Ref Range Status   Specimen Description   Final    URINE, RANDOM Performed at [facility], [address] [place], [place] [address]    Special Requests   Final     NONE Performed at [facility], [address] [place], [place] [address]    Culture (A)  Final    <10,000 COLONIES/mL INSIGNIFICANT GROWTH Performed at [facility], [place] [address] [place], [place] [address]    Report Status 11/05/2019 FINAL  Final  SARS CORONAVIRUS 2 (TAT 6-24 HRS) Nasopharyngeal Nasopharyngeal Swab     Status: None   Collection Time: 11/04/19  8:06 PM   Specimen: Nasopharyngeal Swab  Result Value Ref Range Status   SARS Coronavirus 2 NEGATIVE NEGATIVE Final    Comment: (NOTE) SARS-CoV-2 target nucleic acids are NOT DETECTED. The SARS-CoV-2 RNA is generally detectable in upper and lower respiratory specimens during the acute phase of infection. Negative results do not preclude SARS-CoV-2 infection, do not rule out co-infections with other pathogens, and should not be used as the sole basis for treatment or other patient management decisions. Negative results must be combined with clinical observations, patient history, and epidemiological information. The expected result is Negative. Fact Sheet for Patients: SugarRoll.be Fact Sheet for Healthcare Providers: https://www.woods-mathews.com/ This test is not yet approved or cleared by the [place] FDA and  has been authorized for detection and/or diagnosis of SARS-CoV-2 by FDA under an Emergency Use Authorization (EUA). This EUA will remain  in effect (meaning this test can be used) for the duration of the COVID-19 declaration under Section 56 4(b)(1) of the Act, 21 U.S.C. section 360bbb-3(b)(1), unless the authorization is terminated or revoked sooner. Performed at [facility], [place] [address], [place], [place] [address]   Urine culture     Status: None   Collection Time: 11/06/19 10:39 AM   Specimen: Urine, Random  Result Value Ref Range Status   Specimen Description   Final    URINE, RANDOM Performed at [facility], [address], [place], [place] [address]    Special Requests   Final    NONE Performed at [facility], [address], [place], [place] [address]    Culture   Final    NO GROWTH Performed at [facility], [place] [address], [place], [place] [address]    Report  Status 11/07/2019 FINAL  Final      Studies: NM Bone Scan Whole Body  Result Date: 11/08/2019 CLINICAL DATA:  Metastatic prostate cancer. EXAM: NUCLEAR MEDICINE WHOLE BODY BONE SCAN TECHNIQUE: Whole body anterior and posterior images were obtained approximately 3 hours after intravenous injection of radiopharmaceutical. RADIOPHARMACEUTICALS:  20.5 mCi Technetium-23m MDP IV COMPARISON:  CT 11/05/2019. FINDINGS: Findings suggest left hydronephrosis and left hydroureter as noted on prior CT scan of 11/05/2019. Foley catheter noted. Multiple bony lesions are noted. Lesions noted in the right occiput, clavicles, left upper extremity, sternum, ribs bilaterally, throughout the cervical, thoracic, lumbar spine, bony pelvis, proximal femurs, right tibia. These findings are consistent with widespread metastatic disease. IMPRESSION: 1. Findings consistent with widespread bony metastatic disease. 2. Left hydronephrosis and hydroureter. Electronically Signed   By: Marcello Moores  Register   On: 11/08/2019 14:55   PERIPHERAL VASCULAR CATHETERIZATION  Result Date: 11/07/2019 See op note   Scheduled Meds: . atorvastatin  80 mg Oral Daily  . carvedilol  25 mg Oral BID WC  . Chlorhexidine Gluconate Cloth  6 each Topical Daily  . DULoxetine  60 mg Oral Daily  . finasteride  5 mg Oral Daily  . insulin aspart  0-15 Units Subcutaneous TID WC    Continuous Infusions: . sodium chloride irrigation Stopped (11/05/19 0035)     LOS: 4 days     Annita Brod, MD [facility]  To reach me or the doctor on call, go to: [email or web address] Password [email or web address]  11/08/2019, 3:15 PM

## 2019-11-09 DIAGNOSIS — Z7189 Other specified counseling: Secondary | ICD-10-CM

## 2019-11-09 LAB — GLUCOSE, CAPILLARY
Glucose-Capillary: 150 mg/dL — ABNORMAL HIGH (ref 70–99)
Glucose-Capillary: 156 mg/dL — ABNORMAL HIGH (ref 70–99)
Glucose-Capillary: 167 mg/dL — ABNORMAL HIGH (ref 70–99)
Glucose-Capillary: 150 mg/dL — ABNORMAL HIGH (ref 70–99)

## 2019-11-09 LAB — TESTOSTERONE: Testosterone: 176 ng/dL — ABNORMAL LOW (ref 264–916)

## 2019-11-09 MED ORDER — OXYCODONE-ACETAMINOPHEN 5-325 MG PO TABS
1.0000 | ORAL_TABLET | Freq: Three times a day (TID) | ORAL | Status: DC | PRN
  Administered 2019-11-10: 1 via ORAL
  Filled 2019-11-09 (×2): qty 1

## 2019-11-09 MED ORDER — ENSURE ENLIVE PO LIQD
237.0000 mL | Freq: Two times a day (BID) | ORAL | Status: DC
  Administered 2019-11-10 – 2019-11-13 (×6): 237 mL via ORAL

## 2019-11-09 NOTE — Progress Notes (Signed)
OT Cancellation Note  Patient Details Name: [name] MRN: [id number] DOB: 1932-02-14   Cancelled Treatment:    Reason Eval/Treat Not Completed: Medical issues which prohibited therapy Patient noted with CBI in place (turned off this morning per notes).  Contrainidicated for exertional activity until CBI discontinued. Will continue to follow and initiate as medically appropriate.  Shara Blazing, M.S., OTR/L Ascom: [phone] 11/09/19, 12:23 PM

## 2019-11-09 NOTE — Progress Notes (Addendum)
Urology Consult Follow Up  Subjective: Patient laying comfortably in bed.  CBI on very slow titration urine is light pink.  Patient states he had no issues through the night concerning his bladder, but his nurse states the night shift noted 1 episode of bladder spasm.  VSS afebrile  White blood count 10.1 down from 11.2 yesterday.  Hemoglobin stable at 7.8.  Serum creatinine 1.45 down from 1.62.  Urine culture negative.   Anti-infectives: Anti-infectives (From admission, onward)   Start     Dose/Rate Route Frequency Ordered Stop   11/08/19 0000  ceFAZolin (ANCEF) IVPB 1 g/50 mL premix    Note to Pharmacy: To be given in specials   1 g 100 mL/hr over 30 Minutes Intravenous  Once 11/07/19 1428 11/07/19 1723      Current Facility-Administered Medications  Medication Dose Route Frequency Provider Last Rate Last Admin  . atorvastatin (LIPITOR) tablet 80 mg  80 mg Oral Daily Algernon Huxley, MD   80 mg at 11/08/19 1901  . carvedilol (COREG) tablet 25 mg  25 mg Oral BID WC Algernon Huxley, MD   25 mg at 11/09/19 0841  . Chlorhexidine Gluconate Cloth 2 % PADS 6 each  6 each Topical Daily Algernon Huxley, MD   6 each at 11/08/19 0957  . DULoxetine (CYMBALTA) DR capsule 60 mg  60 mg Oral Daily Algernon Huxley, MD   60 mg at 11/09/19 0841  . finasteride (PROSCAR) tablet 5 mg  5 mg Oral Daily Algernon Huxley, MD   5 mg at 11/09/19 0841  . insulin aspart (novoLOG) injection 0-15 Units  0-15 Units Subcutaneous TID WC Algernon Huxley, MD   2 Units at 11/09/19 [phone]  . ondansetron (ZOFRAN) injection 4 mg  4 mg Intravenous Q6H PRN Algernon Huxley, MD      . opium-belladonna (B&O SUPPRETTES) 16.2-60 MG suppository 1 suppository  1 suppository Rectal Q8H PRN Algernon Huxley, MD      . sodium chloride irrigation 0.9 % 3,000 mL  3,000 mL Irrigation Continuous Algernon Huxley, MD   Stopped at 11/05/19 0035     Objective: Vital signs in last 24 hours: Temp:  [97.9 F (36.6 C)-98.3 F (36.8 C)] 98.1 F (36.7 C) (01/20  0536) Pulse Rate:  [65-70] 70 (01/20 0536) Resp:  [15-16] 16 (01/20 0536) BP: (140-175)/(61-64) 175/64 (01/20 0536) SpO2:  [99 %-100 %] 99 % (01/20 0536)  Intake/Output from previous day: 01/19 0701 - 01/20 0700 In: 20740 [P.O.:140] Out: 28600 [Urine:28600] Intake/Output this shift: Total I/O In: -  Out: 3350 [Urine:3350]   Physical Exam Constitutional:  Well nourished. Alert and oriented, No acute distress. HEENT: [place] AT, moist mucus membranes.  Trachea midline, no masses. Cardiovascular: No clubbing, cyanosis, or edema. Respiratory: Normal respiratory effort, no increased work of breathing. GI: Abdomen is soft, non tender, non distended, no abdominal masses. Liver and spleen not palpable.  No hernias appreciated.  Stool sample for occult testing is not indicated.   GU: No CVA tenderness.  No bladder fullness or masses.  Patient with circumcised phallus. Three way Foley in place.  Draining clear pink urine.   Neurologic: Grossly intact, no focal deficits, moving all 4 extremities. Psychiatric: Normal mood and affect.  Lab Results:  Recent Labs    11/07/19 0645 11/08/19 0520  WBC 11.2* 10.1  HGB 7.7* 7.8*  HCT 24.5* 24.8*  PLT 272 285   BMET Recent Labs    11/07/19 0645  NA  138  K 4.4  CL 105  CO2 24  GLUCOSE 158*  BUN 23  CREATININE 1.45*  CALCIUM 9.3   PT/INR No results for input(s): LABPROT, INR in the last 72 hours. ABG No results for input(s): PHART, HCO3 in the last 72 hours.  Invalid input(s): PCO2, PO2  Studies/Results: CT CHEST WO CONTRAST  Result Date: 11/08/2019 CLINICAL DATA:  Metastatic prostate cancer. Recently admitted for multiple falls with hematuria. Abnormal CT stone study, with findings suspicious for thoracic metastasis. EXAM: CT CHEST WITHOUT CONTRAST TECHNIQUE: Multidetector CT imaging of the chest was performed following the standard protocol without IV contrast. COMPARISON:  Abdominopelvic CT of 11/05/2019 FINDINGS: Cardiovascular:  Aortic atherosclerosis. Tortuous thoracic aorta. Mild cardiomegaly, without pericardial effusion. Multivessel coronary artery atherosclerosis. Pulmonary artery enlargement, outflow tract 3.2 cm Mediastinum/Nodes: No mediastinal or definite hilar adenopathy, given limitations of unenhanced CT. Lungs/Pleura: Small right pleural effusion. Nodularity along the anterior right hemidiaphragm, including on coronal image 35. Example lesion at 2.7 x 2.1 cm on 105/2. Dependent right lower lobe subsegmental atelectasis. Pleural-based 8 mm density in the superior segment left lower lobe is favored to represent scarring or atelectasis on 51/3. Upper Abdomen: Deferred to recent abdominal CT. No acute superimposed process. Musculoskeletal: Moderate left and mild right gynecomastia. Lytic lesion involving the right side of the T2 vertebral body, including posterior elements. Adjacent medial right second rib lucency, including on 30/2. L1 mild-to-moderate compression deformity with underlying lytic lesion. IMPRESSION: 1. Right pleural effusion with pleural metastasis. 2. Areas of bilateral atelectasis, without pulmonary metastasis. 3. Osseous metastasis, including a dominant lesion involving the right-side of the L2 vertebral body. Consider pre and postcontrast thoracic spine MRI for further evaluation. 4. Pulmonary artery enlargement suggests pulmonary arterial hypertension. 5. Aortic atherosclerosis (ICD10-I70.0) and emphysema (ICD10-J43.9). 6. Bilateral gynecomastia. Electronically Signed   By: Abigail Miyamoto M.D.   On: 11/08/2019 15:30   NM Bone Scan Whole Body  Result Date: 11/08/2019 CLINICAL DATA:  Metastatic prostate cancer. EXAM: NUCLEAR MEDICINE WHOLE BODY BONE SCAN TECHNIQUE: Whole body anterior and posterior images were obtained approximately 3 hours after intravenous injection of radiopharmaceutical. RADIOPHARMACEUTICALS:  20.5 mCi Technetium-35m MDP IV COMPARISON:  CT 11/05/2019. FINDINGS: Findings suggest left  hydronephrosis and left hydroureter as noted on prior CT scan of 11/05/2019. Foley catheter noted. Multiple bony lesions are noted. Lesions noted in the right occiput, clavicles, left upper extremity, sternum, ribs bilaterally, throughout the cervical, thoracic, lumbar spine, bony pelvis, proximal femurs, right tibia. These findings are consistent with widespread metastatic disease. IMPRESSION: 1. Findings consistent with widespread bony metastatic disease. 2. Left hydronephrosis and hydroureter. Electronically Signed   By: Marcello Moores  Register   On: 11/08/2019 14:55   PERIPHERAL VASCULAR CATHETERIZATION  Result Date: 11/07/2019 See op note    Assessment and Plan Mr. Etchison presented to the ED with 5 days of gross hematuria and was also found to have metastatic prostate cancer with PSA > 2000.    - CBI turned off this am will continue to monitor, if it remains light pink to clear with discontinue the CBI this afternoon  - IVC filter placed 11/07/2019  - appreciate oncology consult-patient is wanting [name] to speak with his daughter prior to starting treatment for prostate cancer  - will likely discharge with Foley catheter in place but may exchange from the three-way catheter    LOS: 5 days    [facility] 11/09/2019    Addendum: Patient's urine became red when CBI was discontinued.  I attempted to  irrigate three-way catheter that was in place but was unsuccessful.  I then removed the three-way catheter and placed a 24 French Rusch catheter.  I then irrigated with 1.5 L of sterile water with a return of 100 cc of old clot.  I was able to obtain clear yellow urine at the end of irrigation.  I have now placed him on very slow titration of CBI.

## 2019-11-09 NOTE — Consult Note (Signed)
                                                                                 Consultation Note Date: 11/09/19  Patient Name: [name]  DOB: 12/08/1931  MRN: [id number]  Age / Sex: 86 y.o., male  PCP: [name], Brandon L, PA-C Referring Physician: Lai, Tina, MD  Reason for Consultation: Establishing goals of care  HPI/Patient Profile: 86 y.o. male  with past medical history of CAD s/p stents, chronic dCHF, OSA, DM type 2, CKD stage III, recent DVT on eliquis, prostate cancer admitted on 11/04/2019 with hematuria x5 days. Urology consulted. Patient started on CBI. Urology suspected metastatic prostate cancer. PSA 1/18 greater than 2000. CT ab/pelvis revealed multiple areas of metastases. IVC filter placed on 1/18. Ortho following for L1 pathologic fracture with plans for observation at this time. Oncology consulted and following. Palliative medicine consultation for goals of care.   Clinical Assessment and Goals of Care:  I have reviewed medical records, discussed with care team, and assessed the patient. Patient is awake, alert, mildly confused but able to participate in discussion. He defers conversation to his daughter. No family at bedside during initial visit. Patient denies pain or discomfort.   This afternoon, met again with patient and his daughter ([name]) at bedside to discuss goals of care. Patient was recently nauseous and vomiting. Given zofran and now resting. Daughter wanted to talk with patient together about GOC this afternoon. We did not wake [name] but plan to attempt discussions tomorrow.   Introduced Palliative Medicine as specialized medical care for people living with serious illness. It focuses on providing relief from the symptoms and stress of a serious illness. The goal is to improve quality of life for both the patient and the family.  We discussed a brief life review of the patient. Prior to admission, patient was at SNF for rehab for less than 1 month.  Prior to this, living home with daughter, [name]. His wife passed away 1.5 years ago from pancreatic cancer. He has 7 children, 2 deceased. [name] is the only child that is local. [name] reports poor oral intake, weight loss, and frequent falls in the last few months.   Discussed events leading up to admission and course of hospitalization including diagnoses, interventions, plan of care. Reviewed recommendations from specialists. [name] appreciates conversation with Dr. Corcoran. She is hopeful to talk with her again this evening or tomorrow about options and prognosis.   Advanced directives, concepts specific to code status, artifical feeding and hydration, and rehospitalization were considered and discussed. [name] shares that it has been approved for [name]'s attorney to visit the hospital tomorrow afternoon to completed AD/living will. Quality of life is important to him.   Hospice and Palliative Care services outpatient were briefly discussed. Discussed pending PT consult and discharge options including back to SNF for rehab versus home with home health. [name] is leaning towards taking her father home instead of back to SNF. She is tearful throughout the conversation and is very curious about prognosis, sharing that if time was short, she would want him home.   Questions and concerns were addressed. Reassured of ongoing support from   palliative. Hard Choices booklet left for review. PMT contact information given.    SUMMARY OF RECOMMENDATIONS    Continue current plan of care and medical management.   Ongoing palliative discussions.  Daughter hopeful to f/u with [name] today or tomorrow regarding options and prognosis.  Pending PT consult.  Per daughter, plan is to complete advance directive/living will with attorney tomorrow afternoon, 11/10/19.  PMT provider will f/u tomorrow for further discussions.  Code Status/Advance Care Planning  Full code  Symptom  Management:   Per attending  Palliative Prophylaxis:   Aspiration, Bowel Regimen, Delirium Protocol, Frequent Pain Assessment, Oral Care and Turn Reposition  Psycho-social/Spiritual:   Desire for further Chaplaincy support: yes  Additional Recommendations: Caregiving  Support/Resources, Compassionate Wean Education and Education on Hospice  Prognosis:   Unable to determine  Discharge Planning: To Be Determined      Primary Diagnoses: Present on Admission: . Hematuria . CAD (coronary artery disease) . Essential hypertension . Elevated LFTs . Acute blood loss anemia . CKD (chronic kidney disease) stage 3, GFR 30-59 ml/min . Dyslipidemia . Chronic diastolic CHF (congestive heart failure) ([place]) . Morbid obesity ([place]) . DVT (deep venous thrombosis) ([place]) . Prostate cancer metastatic to bone [facility])   I have reviewed the medical record, interviewed the patient and family, and examined the patient. The following aspects are pertinent.  Past Medical History:  Diagnosis Date  . Anemia, normocytic normochromic   . BPH (benign prostatic hyperplasia)   . CAD (coronary artery disease)   . Cataracts, bilateral   . Chronic renal insufficiency   . Colon polyp   . COPD (chronic obstructive pulmonary disease) ([place])   . Diabetes mellitus without complication ([place])   . DVT (deep venous thrombosis) ([place])   . Edema of both legs   . Glaucoma   . Gout   . Heart murmur   . Hepatitis C antibody test positive 10/20/2019  . Hyperlipidemia   . Hypertension   . Moderate persistent asthma without complication   . Myocardial infarction ([place])   . Neuromuscular disorder ([place])   . Neuropathy   . Peripheral vascular disease ([place])   . Premature ejaculation   . Prostate cancer ([place])   . RLS (restless legs syndrome)   . Sleep apnea   . Stented coronary artery 11/13/2015  . Uncontrolled type 2 diabetes mellitus with stage 3 chronic kidney disease, with long-term current use of insulin ([facility])     Social History   Socioeconomic History  . Marital status: Widowed    Spouse name: Not on file  . Number of children: Not on file  . Years of education: Not on file  . Highest education level: Not on file  Occupational History  . Not on file  Tobacco Use  . Smoking status: Former Research scientist (life sciences)  . Smokeless tobacco: Never Used  Substance and Sexual Activity  . Alcohol use: Not on file  . Drug use: Not on file  . Sexual activity: Not on file  Other Topics Concern  . Not on file  Social History Narrative  . Not on file   Social Determinants of Health   Financial Resource Strain:   . Difficulty of Paying Living Expenses: Not on file  Food Insecurity:   . Worried About Charity fundraiser in the Last Year: Not on file  . Ran Out of Food in the Last Year: Not on file  Transportation Needs:   . Lack of Transportation (Medical): Not on file  .  Lack of Transportation (Non-Medical): Not on file  Physical Activity:   . Days of Exercise per Week: Not on file  . Minutes of Exercise per Session: Not on file  Stress:   . Feeling of Stress : Not on file  Social Connections:   . Frequency of Communication with Friends and Family: Not on file  . Frequency of Social Gatherings with Friends and Family: Not on file  . Attends Religious Services: Not on file  . Active Member of Clubs or Organizations: Not on file  . Attends Archivist Meetings: Not on file  . Marital Status: Not on file   History reviewed. No pertinent family history. Scheduled Meds: . atorvastatin  80 mg Oral Daily  . carvedilol  25 mg Oral BID WC  . Chlorhexidine Gluconate Cloth  6 each Topical Daily  . DULoxetine  60 mg Oral Daily  . finasteride  5 mg Oral Daily  . insulin aspart  0-15 Units Subcutaneous TID WC   Continuous Infusions: . sodium chloride irrigation Stopped (11/05/19 0035)   PRN Meds:.ondansetron (ZOFRAN) IV, opium-belladonna Medications Prior to Admission:  Prior to Admission medications    Medication Sig Start Date End Date Taking? Authorizing Provider  aspirin 81 MG chewable tablet Chew 81 mg by mouth daily.  07/15/19  Yes [provider]  atorvastatin (LIPITOR) 80 MG tablet Take 80 mg by mouth daily. 11/15/15  Yes [provider]  carvedilol (COREG) 25 MG tablet Take 25 mg by mouth 2 (two) times daily with a meal.   Yes [provider]  Cholecalciferol (VITAMIN D) 50 MCG (2000 UT) tablet Take 2,000 Units by mouth daily.    Yes [provider]  clopidogrel (PLAVIX) 75 MG tablet Take 75 mg by mouth daily. 11/12/16  Yes [provider]  DULoxetine (CYMBALTA) 60 MG capsule Take 60 mg by mouth daily.   Yes [provider]  ELIQUIS 5 MG TABS tablet Take 5 mg by mouth every 12 (twelve) hours.  10/03/19  Yes [provider]  famotidine (PEPCID) 20 MG tablet Take 20 mg by mouth at bedtime as needed for heartburn.  08/26/19 08/25/20 Yes [provider]  finasteride (PROSCAR) 5 MG tablet Take 5 mg by mouth daily. 05/22/14  Yes [provider]  insulin aspart (NOVOLOG) 100 UNIT/ML FlexPen Inject 12 Units into the skin 3 (three) times daily with meals.    Yes [provider]  Insulin Detemir (LEVEMIR) 100 UNIT/ML Pen Inject 30 Units into the skin 2 (two) times daily. 10/20/19  Yes Samuella Cota, MD  Multiple Vitamin (MULTI-VITAMIN) tablet Take 1 tablet by mouth daily.   Yes [provider]  nitroGLYCERIN (NITROSTAT) 0.4 MG SL tablet Place 0.4 mg under the tongue every 5 (five) minutes as needed for chest pain.    Yes [provider]  torsemide (DEMADEX) 20 MG tablet Take 2 tablets (40 mg total) by mouth daily. Patient not taking: Reported on 11/04/2019 10/21/19   Samuella Cota, MD   Allergies  Allergen Reactions  . Metformin And Related Diarrhea    Intolerance due to naturally loose bowels   Review of Systems  Constitutional: Positive for activity change and appetite change.    Physical Exam Vitals and nursing note reviewed.  Constitutional:      General: He is awake.     Appearance: He is ill-appearing.  HENT:     Head: Normocephalic and atraumatic.  Pulmonary:     Effort: No tachypnea, accessory  muscle usage or respiratory distress.  Abdominal:     Tenderness: There is no abdominal tenderness.  Skin:    General: Skin is warm and dry.  Neurological:     Mental Status: He is alert and oriented to person, place, and time.  Psychiatric:        Mood and Affect: Mood normal.        Speech: Speech normal.        Behavior: Behavior normal.        Cognition and Memory: Cognition normal.    Vital Signs: BP (!) 148/66 (BP Location: Right Arm)   Pulse 67   Temp 97.9 F (36.6 C) (Oral)   Resp 16   Ht 5' 11" (1.803 m)   Wt 115.4 kg   SpO2 96%   BMI 35.48 kg/m  Pain Scale: 0-10 POSS *See Group Information*: 2-Acceptable,Slightly drowsy, easily aroused Pain Score: 0-No pain   SpO2: SpO2: 96 % O2 Device:SpO2: 96 % O2 Flow Rate: .O2 Flow Rate (L/min): 6 L/min  IO: Intake/output summary:   Intake/Output Summary (Last 24 hours) at 11/09/2019 1403 Last data filed at 11/09/2019 1320 Gross per 24 hour  Intake 14740 ml  Output 15250 ml  Net -510 ml    LBM: Last BM Date: 11/07/19 Baseline Weight: Weight: 123.4 kg Most recent weight: Weight: 115.4 kg     Palliative Assessment/Data: PPS 50%     Time In/Out: 1330-1410, 1620-1650 Time Total: 70 Greater than 50%  of this time was spent counseling and coordinating care related to the above assessment and plan.  Signed by:  Megan Mason, DNP, FNP-C Palliative Medicine Team  Phone: [phone] Fax: [phone]   Please contact Palliative Medicine Team phone at [phone] for questions and concerns.  For individual provider: See Amion             

## 2019-11-09 NOTE — Progress Notes (Signed)
PT Cancellation Note  Patient Details Name: [name] MRN: [id number] DOB: 01-23-1932   Cancelled Treatment:    Reason Eval/Treat Not Completed: (Patient noted with CBI in place (turned off this morning per notes).  Contrainidicated for exertional activity until CBI discontinued. Will continue to follow and initiate as medically appropriate.)   [name] H. Owens Shark, PT, DPT, NCS 11/09/19, 12:13 PM [phone]

## 2019-11-09 NOTE — Progress Notes (Signed)
[facility] Hematology/Oncology Progress Note  Date of admission: 11/04/2019  Hospital day:  11/09/2019  Chief Complaint: [name] is a 86 y.o. male who was admitted who with hematuria.  Subjective:  Patient denies any new complaints.  He wants to get out of bed.  He states that he is not eating as he has no appetite.  Social History: The patient is alone today.  Allergies:  Allergies  Allergen Reactions  . Metformin And Related Diarrhea    Intolerance due to naturally loose bowels    Scheduled Medications: . atorvastatin  80 mg Oral Daily  . carvedilol  25 mg Oral BID WC  . Chlorhexidine Gluconate Cloth  6 each Topical Daily  . DULoxetine  60 mg Oral Daily  . feeding supplement (ENSURE ENLIVE)  237 mL Oral BID BM  . finasteride  5 mg Oral Daily  . insulin aspart  0-15 Units Subcutaneous TID WC    Review of Systems: GENERAL:  Tired.  Weight loss.  No fevers or sweats. PERFORMANCE STATUS (ECOG):  2-3 HEENT:  No visual changes, runny nose, sore throat, mouth sores or tenderness. Lungs: No shortness of breath or cough.  No hemoptysis. Cardiac:  No chest pain, palpitations, orthopnea, or PND. GI:  Poor appetite.  No nausea, vomiting, diarrhea, constipation, melena or hematochezia. GU:  Hematuria, improving.  No urgency, frequency, dysuria. Musculoskeletal:  No back pain.  Chronic joint pain (shoulder, knees).  No muscle tenderness. Extremities:  No pain or swelling. Skin:  No rashes or skin changes. Neuro:  No headache, numbness or weakness, balance or coordination issues. Endocrine:  Diabetes.  No thyroid issues, hot flashes or night sweats. Psych:  No mood changes, depression or anxiety. Pain:  No focal pain. Review of systems:  All other systems reviewed and found to be negative.  Physical Exam: Blood pressure (!) 145/65, pulse 66, temperature 98.3 F (36.8 C), temperature source Oral, resp. rate 16, height 5\' 11"  (1.803 m), weight 254 lb 6.6  oz (115.4 kg), SpO2 97 %.  GENERAL:  Well developed, well nourished, elderly gentleman lying comfortably on the medical unit in no acute distress. MENTAL STATUS:  Alert and oriented to person, place and time. HEAD: Short graying hair and beard.  Male pattern baldness.  Normocephalic, atraumatic, face symmetric, no Cushingoid features. EYES:  Brown eyes.  Pupils equal round and reactive to light and accomodation.  No conjunctivitis or scleral icterus. ENT:  Oropharynx clear without lesion.  Tongue normal. Mucous membranes moist.  RESPIRATORY:  Slight decreased breath sounds right base anteriorly.  No rales, wheezes or rhonchi. CARDIOVASCULAR:  Regular rate and rhythm without murmur, rub or gallop. ABDOMEN:  Soft, non-tender, with active bowel sounds, and no appreciable hepatosplenomegaly.  No masses. GU:  Catheter in place.  Urine light pink. SKIN:  No rashes, ulcers or lesions. EXTREMITIES: No edema, no skin discoloration or tenderness.  No palpable cords. NEUROLOGICAL: Unremarkable. PSYCH:  Appropriate.   Results for orders placed or performed during the hospital encounter of 11/04/19 (from the past 48 hour(s))  Glucose, capillary     Status: Abnormal   Collection Time: 11/07/19  9:05 PM  Result Value Ref Range   Glucose-Capillary 161 (H) 70 - 99 mg/dL   Comment 1 Notify RN   CBC     Status: Abnormal   Collection Time: 11/08/19  5:20 AM  Result Value Ref Range   WBC 10.1 4.0 - 10.5 K/uL   RBC 2.82 (L) 4.22 - 5.81 MIL/uL  Hemoglobin 7.8 (L) 13.0 - 17.0 g/dL   HCT 24.8 (L) 39.0 - 52.0 %   MCV 87.9 80.0 - 100.0 fL   MCH 27.7 26.0 - 34.0 pg   MCHC 31.5 30.0 - 36.0 g/dL   RDW 15.0 11.5 - 15.5 %   Platelets 285 150 - 400 K/uL   nRBC 0.0 0.0 - 0.2 %    Comment: Performed at [facility], [address] [place], [place] [address]  Testosterone     Status: Abnormal   Collection Time: 11/08/19  5:20 AM  Result Value Ref Range   Testosterone 176 (L) 264 - 916 ng/dL     Comment: (NOTE) Adult male reference interval is based on a population of healthy nonobese males (BMI <30) between 2 and 10 years old. [place], [place] [phone]. PMID: 60600459. Performed At: [facility] [place], [place] [id number] Rush Farmer MD RV:[id number]   Ferritin     Status: Abnormal   Collection Time: 11/08/19  5:20 AM  Result Value Ref Range   Ferritin 1,454 (H) 24 - 336 ng/mL    Comment: Performed at [facility], [address] [place], [place] [address]  Iron and TIBC     Status: Abnormal   Collection Time: 11/08/19  5:20 AM  Result Value Ref Range   Iron 27 (L) 45 - 182 ug/dL   TIBC 139 (L) 250 - 450 ug/dL   Saturation Ratios 20 17.9 - 39.5 %   UIBC 112 ug/dL    Comment: Performed at [facility], [address] [place], [place] [address]  Vitamin B12     Status: None   Collection Time: 11/08/19  5:20 AM  Result Value Ref Range   Vitamin B-12 563 180 - 914 pg/mL    Comment: (NOTE) This assay is not validated for testing neonatal or myeloproliferative syndrome specimens for Vitamin B12 levels. Performed at [facility], [place] [address], [place], [place] [address]   Folate     Status: None   Collection Time: 11/08/19  5:20 AM  Result Value Ref Range   Folate 7.7 >5.9 ng/mL    Comment: Performed at [facility], [address] [place], [place] [address]  Reticulocytes     Status: Abnormal   Collection Time: 11/08/19  5:20 AM  Result Value Ref Range   Retic Ct Pct 2.8 0.4 - 3.1 %   RBC. 2.79 (L) 4.22 - 5.81 MIL/uL   Retic Count, Absolute 77.8 19.0 - 186.0 K/uL   Immature Retic Fract 29.0 (H) 2.3 - 15.9 %    Comment: Performed at [facility], [address] [place], [place] [address]  Glucose, capillary     Status: Abnormal   Collection Time: 11/08/19  7:52 AM  Result Value Ref Range   Glucose-Capillary 161 (H) 70 - 99 mg/dL   Comment 1 Notify RN   Glucose, capillary      Status: Abnormal   Collection Time: 11/08/19 11:52 AM  Result Value Ref Range   Glucose-Capillary 162 (H) 70 - 99 mg/dL   Comment 1 Notify RN   Glucose, capillary     Status: Abnormal   Collection Time: 11/08/19  5:29 PM  Result Value Ref Range   Glucose-Capillary 136 (H) 70 - 99 mg/dL   Comment 1 Notify RN   Glucose, capillary     Status: Abnormal   Collection Time: 11/08/19  8:51 PM  Result Value Ref Range  Glucose-Capillary 161 (H) 70 - 99 mg/dL   Comment 1 Notify RN   Glucose, capillary     Status: Abnormal   Collection Time: 11/09/19  7:32 AM  Result Value Ref Range   Glucose-Capillary 150 (H) 70 - 99 mg/dL   Comment 1 Notify RN   Glucose, capillary     Status: Abnormal   Collection Time: 11/09/19 11:59 AM  Result Value Ref Range   Glucose-Capillary 156 (H) 70 - 99 mg/dL   Comment 1 Notify RN   Glucose, capillary     Status: Abnormal   Collection Time: 11/09/19  4:30 PM  Result Value Ref Range   Glucose-Capillary 167 (H) 70 - 99 mg/dL   Comment 1 Notify RN    CT CHEST WO CONTRAST  Result Date: 11/08/2019 CLINICAL DATA:  Metastatic prostate cancer. Recently admitted for multiple falls with hematuria. Abnormal CT stone study, with findings suspicious for thoracic metastasis. EXAM: CT CHEST WITHOUT CONTRAST TECHNIQUE: Multidetector CT imaging of the chest was performed following the standard protocol without IV contrast. COMPARISON:  Abdominopelvic CT of 11/05/2019 FINDINGS: Cardiovascular: Aortic atherosclerosis. Tortuous thoracic aorta. Mild cardiomegaly, without pericardial effusion. Multivessel coronary artery atherosclerosis. Pulmonary artery enlargement, outflow tract 3.2 cm Mediastinum/Nodes: No mediastinal or definite hilar adenopathy, given limitations of unenhanced CT. Lungs/Pleura: Small right pleural effusion. Nodularity along the anterior right hemidiaphragm, including on coronal image 35. Example lesion at 2.7 x 2.1 cm on 105/2. Dependent right lower lobe  subsegmental atelectasis. Pleural-based 8 mm density in the superior segment left lower lobe is favored to represent scarring or atelectasis on 51/3. Upper Abdomen: Deferred to recent abdominal CT. No acute superimposed process. Musculoskeletal: Moderate left and mild right gynecomastia. Lytic lesion involving the right side of the T2 vertebral body, including posterior elements. Adjacent medial right second rib lucency, including on 30/2. L1 mild-to-moderate compression deformity with underlying lytic lesion. IMPRESSION: 1. Right pleural effusion with pleural metastasis. 2. Areas of bilateral atelectasis, without pulmonary metastasis. 3. Osseous metastasis, including a dominant lesion involving the right-side of the L2 vertebral body. Consider pre and postcontrast thoracic spine MRI for further evaluation. 4. Pulmonary artery enlargement suggests pulmonary arterial hypertension. 5. Aortic atherosclerosis (ICD10-I70.0) and emphysema (ICD10-J43.9). 6. Bilateral gynecomastia. Electronically Signed   By: Abigail Miyamoto M.D.   On: 11/08/2019 15:30   NM Bone Scan Whole Body  Result Date: 11/08/2019 CLINICAL DATA:  Metastatic prostate cancer. EXAM: NUCLEAR MEDICINE WHOLE BODY BONE SCAN TECHNIQUE: Whole body anterior and posterior images were obtained approximately 3 hours after intravenous injection of radiopharmaceutical. RADIOPHARMACEUTICALS:  20.5 mCi Technetium-55m MDP IV COMPARISON:  CT 11/05/2019. FINDINGS: Findings suggest left hydronephrosis and left hydroureter as noted on prior CT scan of 11/05/2019. Foley catheter noted. Multiple bony lesions are noted. Lesions noted in the right occiput, clavicles, left upper extremity, sternum, ribs bilaterally, throughout the cervical, thoracic, lumbar spine, bony pelvis, proximal femurs, right tibia. These findings are consistent with widespread metastatic disease. IMPRESSION: 1. Findings consistent with widespread bony metastatic disease. 2. Left hydronephrosis and  hydroureter. Electronically Signed   By: Marcello Moores  Register   On: 11/08/2019 14:55    Assessment:  [name] is a 86 y.o. male with metastatic prostate cancer.  He presented with gross hematuria.  PSA  was 2,241.0 on 11/06/2019.   Renal stone CT on 11/05/2019 revealed confluent nodular masses adjacent to the prostate and distal sigmoid colon with left pelvic, perirectal, retroperitoneal, and mesenteric adenopathy as well as probable peritoneal disease most marked in the right  upper quadrant extending about the right diaphragmatic leaflet.  There was pelvic and suspected vertebral osseous metastatic disease with L1 compression fracture, likely pathologic.  There was a possible 2 cm right lower lobe lung nodule.  There was left hydronephrosis and proximal ureterectasis secondary to pelvic process (prior renal ultrasound on 10/16/2019 revealed no hydronephrosis).  Chest CT on 11/08/2019 revealed right pleural effusion with pleural metastasis (2.7 x 2.1 cm nodularity along anterior right hemidiaphragm).  Osseous metastasis, including a dominant lesion involving the right-side of the L2 vertebral body.   Bone scan on 11/08/2019 revealed widespread bony metastasis (right occiput, clavicles, left upper extremity, sternum, ribs bilaterally, throughout the cervical, thoracic, lumbar spine, bony pelvis, proximal femurs, right tibia).  The patient has a history of DVT approximately 3 months ago.  IVC filter was placed on 11/07/2019.  Symptomatically, he notes no appetite.  He denies any bone pain.  Urine is slightly pink.  Plan:   1.   Metastatic prostatic prostate cancer             Additional imaging from recent chest CT and bone scan reviewed with patient.             Patient has extensive pelvic, perirectal, retroperitoneal and mesenteric adenopathy.             Patient has extensive osseous metastasis.             Suspect pleural based nodularity is prostate cancer.             Review  orthopedics consult for L1 pathologic compression.   Patient currently asymptomatic.   No neurologic symptoms.             Re-review plan to initiate degarelix [facility] antagonist) + abiraterone.                         Potential side effects re-reviewed.                         I spoke with his daughter yesterday at length.     Per patient, they have not discussed together.   At this time, he is not willing to proceed with treatment.  Patient to be presented at tumor board on 11/10/2019. 2.   Hematuria             Aspirin, Plavix, and Eliquis are held.             Patient s/p IVC filter placement on 11/07/2019.             CBC appears stable. 3.   Normocytic anemia             Hematocrit 24.8.  Hemoglobin 7.8.  MCV 87.9 on 11/08/2019.              Etiology likely secondary to acute blood loss plus anemia of chronic renal disease.             Given baseline renal insufficiency, normal serum protein and low albumen, r/o monoclonal gammopathy.   SPEP is pending.             Patient is s/p 1 unit of PRBCs.             Maintain hemoglobin 7-8.             Transfuse PRBC as needed.             Ferritin 1454 (acute  phase reactant) with iron saturation 20% and TIBC 1389.  B12 and folate normal. 4.   Disposition  Will contact patient's daughter tomorrow to encourage conversation with her father and decision regarding patient's treatment.   Lequita Asal, MD  11/09/2019, 8:49 PM

## 2019-11-09 NOTE — TOC Initial Note (Signed)
Transition of Care [facility]) - Initial/Assessment Note    Patient Details  Name: [name] MRN: [id number] Date of Birth: 09-Jun-1932  Transition of Care [facility]) CM/SW Contact:    Beverly Sessions, RN Phone Number: 11/09/2019, 4:58 PM  Clinical Narrative:                 Per Claiborne Billings at [facility] patient was there for short term rehab If patient needs to return for rehab would have to have therapy notes, insurance approval, and repeat covid test with in 48 hours of DC        Patient Goals and CMS Choice        Expected Discharge Plan and Services                                                Prior Living Arrangements/Services                       Activities of Daily Living Home Assistive Devices/Equipment: Cane (specify quad or straight), Walker (specify type) ADL Screening (condition at time of admission) Patient's cognitive ability adequate to safely complete daily activities?: Yes Is the patient deaf or have difficulty hearing?: No Does the patient have difficulty seeing, even when wearing glasses/contacts?: No Does the patient have difficulty concentrating, remembering, or making decisions?: No Patient able to express need for assistance with ADLs?: Yes Does the patient have difficulty dressing or bathing?: No Independently performs ADLs?: Yes (appropriate for developmental age) Does the patient have difficulty walking or climbing stairs?: Yes Weakness of Legs: Both Weakness of Arms/Hands: None  Permission Sought/Granted                  Emotional Assessment              Admission diagnosis:  Anticoagulated [Z79.01] Hematuria [R31.9] Hematuria, unspecified type [R31.9] Patient Active Problem List   Diagnosis Date Noted  . Prostate cancer metastatic to bone ([place]) 11/07/2019  . Elevated INR 11/05/2019  . Acute blood loss anemia 11/05/2019  . OSA on CPAP 11/05/2019  . Dyslipidemia 11/05/2019  . Morbid obesity ([place])  11/05/2019  . DVT (deep venous thrombosis) ([place]) 11/05/2019  . Hematuria 11/04/2019  . Hepatitis C antibody test positive 10/20/2019  . Anasarca 10/15/2019  . Acute exacerbation of CHF (congestive heart failure) ([place]) 10/15/2019  . CAD (coronary artery disease) 10/15/2019  . Insulin dependent type 2 diabetes mellitus ([place]) 10/15/2019  . Essential hypertension 10/15/2019  . Chronic anticoagulation 10/15/2019  . Acute kidney injury superimposed on CKD ([place]) 10/15/2019  . CKD (chronic kidney disease) stage 3, GFR 30-59 ml/min 10/15/2019  . Generalized weakness 10/15/2019  . Frequent falls 10/15/2019  . Elevated liver enzymes 10/15/2019  . Chronic diastolic CHF (congestive heart failure) ([place]) 10/15/2019  . Elevated LFTs    PCP:  [name], PA-C Pharmacy:   [facility], [address] [facility] [place] [address] Phone: [phone] Fax: [phone]     Social Determinants of Health (SDOH) Interventions    Readmission Risk Interventions No flowsheet data found.

## 2019-11-09 NOTE — Progress Notes (Signed)
PROGRESS NOTE  [name] RKY:[id number] DOB: 1932/07/08 DOA: 11/04/2019 PCP: [name], PA-C  HPI/Recap of past 24 hours: Patient is an 86 year old male with past medical history of CAD status post stent, chronic diastolic heart failure, sleep apnea, diabetes mellitus, stage III chronic kidney disease and recent DVT diagnosed 3 months ago placed on Eliquis who presented with hematuria.  Per his assisted living facility, patient had been reportedly having hematuria for the past 5 days.  In the emergency room, patient was noted to have a white count of 11.9 and a hemoglobin of 8.3, down from 9.52 weeks prior.  He was noted to have gross hematuria with clots on bladder scan noted a third of a liter.  Patient was started on continuous bladder irrigation and urology was consulted.  Urology was able to irrigate the bladder with about a liter of normal saline and then the patient was set up for continuous bladder irrigation.  He was also transfused 1 unit packed red blood cells.  His Eliquis was held.  Following transfusion, patient's hemoglobin improved to 8.6, and by this afternoon, down to 7.9.  In discussion with urology, they suspect that he has metastatic prostate cancer.  CT scan of abdomen pelvis notes multiple areas of metastases.  PSA came back on 1/18 greater than 2000.  Oncology consulted & and have ordered CT scan of chest which is pending as well as bone scan which shows diffuse bone metastases.  Vascular surgery placed IVC filter on 1/18.  Orthopedics consulted for possible vertebroplasty at L1 site for pathologic fracture.   Subjective/interval history: No fever, dyspnea, cough, chest pain, abdominal pain, N/V/D, dysuria, increased swelling.  No appetite, but is willing to try Ensure.   Assessment/Plan: Principal Problem:   Hematuria and acute blood loss anemia in patient with prostate cancer on anticoagulation: Anticoagulation discontinued.  Being followed by urology.   Hemoglobin remaining stable for now. --continue bladder irrigation, per urology, still outputting pink urine.    CAD (coronary artery disease) stable.    Insulin dependent type 2 diabetes mellitus ([place]): On sliding scale.    Essential hypertension: Blood pressures have been stable to slightly on the high end.  Continue home medications.    CKD (chronic kidney disease) stage 3a, GFR 30-59 ml/min: Stable.,  At baseline.    Chronic diastolic CHF (congestive heart failure) [facility]): Monitor closely.  Given blood and IVC fluid, likely could become hypervolemic easily.  Diurese as appropriate.    Elevated LFTs  Metastatic prostate cancer with widespread metastases to bone as well as liver:  --PSA greater than 2000.  Oncology discussing treatment options.   The patient's wife passed away in the last year from pancreatic cancer.  Discussed with daughter.  She used hospice at the end.  This may be an option for this patient  Pathologic L1 fracture minimally symptomatic --Ortho consulted, and rec observation    OSA on CPAP: Nightly CPAP.   Dyslipidemia: Continue statin.    Morbid obesity ([place]): Meets criteria for BMI greater than 35+ diabetes and hypertension and heart failure.    DVT (deep venous thrombosis) ([place]): Diagnosed 3 months ago.  Can no longer be on anticoagulation.  Vascular surgery placed IVC filter on 1/18   Code Status: Full code  Family Communication: Updated daughter by phone  Disposition Plan: Home after orthopedic weigh in and oncology plan in place   Consultants:  Urology  Vascular surgery  Oncology  Orthopedics  Procedures:  Bladder irrigation done 1/15  IVC filter placement 1/18  Antimicrobials:  None  DVT prophylaxis: SCDs   Objective: Vitals:   11/09/19 0536 11/09/19 1303  BP: (!) 175/64 (!) 148/66  Pulse: 70 67  Resp: 16 16  Temp: 98.1 F (36.7 C) 97.9 F (36.6 C)  SpO2: 99% 96%    Intake/Output Summary (Last 24 hours) at 11/09/2019  1739 Last data filed at 11/09/2019 1618 Gross per 24 hour  Intake 14900 ml  Output 14700 ml  Net 200 ml   Filed Weights   11/04/19 1241 11/05/19 0135  Weight: 123.4 kg 115.4 kg   Body mass index is 35.48 kg/m.  Exam: Constitutional: NAD, AAOx3 HEENT: conjunctivae and lids normal, EOMI CV: RRR no M,R,G. Distal pulses +2.  No cyanosis.   RESP: CTA B/L, normal respiratory effort  GI: +BS, NTND Extremities: No effusions, edema, or tenderness in BLE SKIN: warm.  Skin over both lower legs have severe venous stasis changes, dry, leathery, at high risk of cracking Neuro: II - XII grossly intact.  Sensation intact Psych: Normal mood and affect.  Appropriate judgement and reason Bladder irrigation outputting pink urine.   Data Reviewed: CBC: Recent Labs  Lab 11/04/19 1248 11/05/19 0020 11/05/19 0623 11/05/19 1500 11/06/19 0514 11/07/19 0645 11/08/19 0520  WBC 11.9*  --   --   --  12.1* 11.2* 10.1  NEUTROABS 9.8*  --   --   --   --   --   --   HGB 8.3*   < > 8.6* 7.9* 7.7* 7.7* 7.8*  HCT 26.1*   < > 27.0* 24.1* 24.0* 24.5* 24.8*  MCV 85.9  --   --   --  86.6 86.9 87.9  PLT 262  --   --   --  247 272 285   < > = values in this interval not displayed.   Basic Metabolic Panel: Recent Labs  Lab 11/04/19 1248 11/05/19 0623 11/06/19 0514 11/07/19 0645  NA 134* 135 137 138  K 4.6 4.4 4.6 4.4  CL 100 101 104 105  CO2 24 24 24 24   GLUCOSE 136* 154* 128* 158*  BUN 27* 29* 27* 23  CREATININE 1.98* 1.61* 1.62* 1.45*  CALCIUM 9.3 9.4 9.2 9.3   GFR: Estimated Creatinine Clearance: 48.1 mL/min (A) (by C-G formula based on SCr of 1.45 mg/dL (H)). Liver Function Tests: Recent Labs  Lab 11/04/19 1248 11/07/19 0645  AST 43* 23  ALT 45* 31  ALKPHOS 159* 111  BILITOT 1.1 1.0  PROT 7.0 6.2*  ALBUMIN 3.2* 2.7*   Recent Labs  Lab 11/04/19 1248  LIPASE 20   No results for input(s): AMMONIA in the last 168 hours. Coagulation Profile: Recent Labs  Lab 11/04/19 1527  INR  2.0*   Cardiac Enzymes: No results for input(s): CKTOTAL, CKMB, CKMBINDEX, TROPONINI in the last 168 hours. BNP (last 3 results) No results for input(s): PROBNP in the last 8760 hours. HbA1C: No results for input(s): HGBA1C in the last 72 hours. CBG: Recent Labs  Lab 11/08/19 1729 11/08/19 2051 11/09/19 0732 11/09/19 1159 11/09/19 1630  GLUCAP 136* 161* 150* 156* 167*   Lipid Profile: No results for input(s): CHOL, HDL, LDLCALC, TRIG, CHOLHDL, LDLDIRECT in the last 72 hours. Thyroid Function Tests: No results for input(s): TSH, T4TOTAL, FREET4, T3FREE, THYROIDAB in the last 72 hours. Anemia Panel: Recent Labs    11/08/19 0520  VITAMINB12 563  FOLATE 7.7  FERRITIN 1,454*  TIBC 139*  IRON 27*  RETICCTPCT 2.8  Urine analysis:    Component Value Date/Time   COLORURINE RED (A) 11/06/2019 1039   APPEARANCEUR HAZY (A) 11/06/2019 1039   LABSPEC 1.005 11/06/2019 1039   PHURINE  11/06/2019 1039    TEST NOT REPORTED DUE TO COLOR INTERFERENCE OF URINE PIGMENT   GLUCOSEU (A) 11/06/2019 1039    TEST NOT REPORTED DUE TO COLOR INTERFERENCE OF URINE PIGMENT   HGBUR (A) 11/06/2019 1039    TEST NOT REPORTED DUE TO COLOR INTERFERENCE OF URINE PIGMENT   BILIRUBINUR (A) 11/06/2019 1039    TEST NOT REPORTED DUE TO COLOR INTERFERENCE OF URINE PIGMENT   KETONESUR (A) 11/06/2019 1039    TEST NOT REPORTED DUE TO COLOR INTERFERENCE OF URINE PIGMENT   PROTEINUR (A) 11/06/2019 1039    TEST NOT REPORTED DUE TO COLOR INTERFERENCE OF URINE PIGMENT   NITRITE (A) 11/06/2019 1039    TEST NOT REPORTED DUE TO COLOR INTERFERENCE OF URINE PIGMENT   LEUKOCYTESUR (A) 11/06/2019 1039    TEST NOT REPORTED DUE TO COLOR INTERFERENCE OF URINE PIGMENT   Sepsis Labs: @LABRCNTIP (procalcitonin:4,lacticidven:4)  ) Recent Results (from the past 240 hour(s))  Urine culture     Status: Abnormal   Collection Time: 11/04/19  3:52 PM   Specimen: Urine, Random  Result Value Ref Range Status   Specimen  Description   Final    URINE, RANDOM Performed at [facility], [address] [place], [place] [address]    Special Requests   Final    NONE Performed at [facility], [address] [place], [place] [address]    Culture (A)  Final    <10,000 COLONIES/mL INSIGNIFICANT GROWTH Performed at [facility], [place] [address], [place], [place] [address]    Report Status 11/05/2019 FINAL  Final  SARS CORONAVIRUS 2 (TAT 6-24 HRS) Nasopharyngeal Nasopharyngeal Swab     Status: None   Collection Time: 11/04/19  8:06 PM   Specimen: Nasopharyngeal Swab  Result Value Ref Range Status   SARS Coronavirus 2 NEGATIVE NEGATIVE Final    Comment: (NOTE) SARS-CoV-2 target nucleic acids are NOT DETECTED. The SARS-CoV-2 RNA is generally detectable in upper and lower respiratory specimens during the acute phase of infection. Negative results do not preclude SARS-CoV-2 infection, do not rule out co-infections with other pathogens, and should not be used as the sole basis for treatment or other patient management decisions. Negative results must be combined with clinical observations, patient history, and epidemiological information. The expected result is Negative. Fact Sheet for Patients: SugarRoll.be Fact Sheet for Healthcare Providers: https://www.woods-mathews.com/ This test is not yet approved or cleared by the [place] FDA and  has been authorized for detection and/or diagnosis of SARS-CoV-2 by FDA under an Emergency Use Authorization (EUA). This EUA will remain  in effect (meaning this test can be used) for the duration of the COVID-19 declaration under Section 56 4(b)(1) of the Act, 21 U.S.C. section 360bbb-3(b)(1), unless the authorization is terminated or revoked sooner. Performed at [facility], [place] [address], [place], [place] [address]   Urine culture     Status: None   Collection Time: 11/06/19 10:39 AM    Specimen: Urine, Random  Result Value Ref Range Status   Specimen Description   Final    URINE, RANDOM Performed at [facility], [address], [place], [place] [address]    Special Requests   Final    NONE Performed at [facility], [place], [place]  [address]    Culture   Final    NO GROWTH Performed at [facility], [place] [address] [place], [place] [address]    Report Status 11/07/2019 FINAL  Final      Studies: No results found.  Scheduled Meds: . atorvastatin  80 mg Oral Daily  . carvedilol  25 mg Oral BID WC  . Chlorhexidine Gluconate Cloth  6 each Topical Daily  . DULoxetine  60 mg Oral Daily  . feeding supplement (ENSURE ENLIVE)  237 mL Oral BID BM  . finasteride  5 mg Oral Daily  . insulin aspart  0-15 Units Subcutaneous TID WC    Continuous Infusions: . sodium chloride irrigation Stopped (11/05/19 0035)     LOS: 5 days     Enzo Bi, MD [facility]   11/09/2019, 5:39 PM

## 2019-11-10 ENCOUNTER — Other Ambulatory Visit: Payer: Medicare Other

## 2019-11-10 DIAGNOSIS — Z7189 Other specified counseling: Secondary | ICD-10-CM

## 2019-11-10 DIAGNOSIS — Z515 Encounter for palliative care: Secondary | ICD-10-CM

## 2019-11-10 LAB — CBC
HCT: 25.3 % — ABNORMAL LOW (ref 39.0–52.0)
Hemoglobin: 7.9 g/dL — ABNORMAL LOW (ref 13.0–17.0)
MCH: 27.2 pg (ref 26.0–34.0)
MCHC: 31.2 g/dL (ref 30.0–36.0)
MCV: 87.2 fL (ref 80.0–100.0)
Platelets: 334 10*3/uL (ref 150–400)
RBC: 2.9 MIL/uL — ABNORMAL LOW (ref 4.22–5.81)
RDW: 15 % (ref 11.5–15.5)
WBC: 10.5 10*3/uL (ref 4.0–10.5)
nRBC: 0 % (ref 0.0–0.2)

## 2019-11-10 LAB — GLUCOSE, CAPILLARY
Glucose-Capillary: 146 mg/dL — ABNORMAL HIGH (ref 70–99)
Glucose-Capillary: 139 mg/dL — ABNORMAL HIGH (ref 70–99)
Glucose-Capillary: 188 mg/dL — ABNORMAL HIGH (ref 70–99)
Glucose-Capillary: 150 mg/dL — ABNORMAL HIGH (ref 70–99)

## 2019-11-10 LAB — MULTIPLE MYELOMA PANEL, SERUM
Albumin SerPl Elph-Mcnc: 2.7 g/dL — ABNORMAL LOW (ref 2.9–4.4)
Albumin/Glob SerPl: 0.9 (ref 0.7–1.7)
Alpha 1: 0.4 g/dL (ref 0.0–0.4)
Alpha2 Glob SerPl Elph-Mcnc: 0.9 g/dL (ref 0.4–1.0)
B-Globulin SerPl Elph-Mcnc: 0.8 g/dL (ref 0.7–1.3)
Gamma Glob SerPl Elph-Mcnc: 0.9 g/dL (ref 0.4–1.8)
Globulin, Total: 3.1 g/dL (ref 2.2–3.9)
IgA: 657 mg/dL — ABNORMAL HIGH (ref 61–437)
IgG (Immunoglobin G), Serum: 568 mg/dL — ABNORMAL LOW (ref 603–1613)
IgM (Immunoglobulin M), Srm: 26 mg/dL (ref 15–143)
M Protein SerPl Elph-Mcnc: 0.4 g/dL — ABNORMAL HIGH
Total Protein ELP: 5.8 g/dL — ABNORMAL LOW (ref 6.0–8.5)

## 2019-11-10 LAB — BASIC METABOLIC PANEL
Anion gap: 9 (ref 5–15)
BUN: 14 mg/dL (ref 8–23)
CO2: 25 mmol/L (ref 22–32)
Calcium: 9.5 mg/dL (ref 8.9–10.3)
Chloride: 104 mmol/L (ref 98–111)
Creatinine, Ser: 1.44 mg/dL — ABNORMAL HIGH (ref 0.61–1.24)
GFR calc Af Amer: 51 mL/min — ABNORMAL LOW (ref >60)
GFR calc non Af Amer: 44 mL/min — ABNORMAL LOW (ref >60)
Glucose, Bld: 170 mg/dL — ABNORMAL HIGH (ref 70–99)
Potassium: 4 mmol/L (ref 3.5–5.1)
Sodium: 138 mmol/L (ref 135–145)

## 2019-11-10 LAB — MAGNESIUM: Magnesium: 1.8 mg/dL (ref 1.7–2.4)

## 2019-11-10 NOTE — Progress Notes (Signed)
PROGRESS NOTE  [name] MWU:[id number] DOB: 04-Feb-1932 DOA: 11/04/2019 PCP: [name], PA-C  HPI/Recap of past 24 hours: Patient is an 86 year old male with past medical history of CAD status post stent, chronic diastolic heart failure, sleep apnea, diabetes mellitus, stage III chronic kidney disease and recent DVT diagnosed 3 months ago placed on Eliquis who presented with hematuria.  Per his assisted living facility, patient had been reportedly having hematuria for the past 5 days.  In the emergency room, patient was noted to have a white count of 11.9 and a hemoglobin of 8.3, down from 9.52 weeks prior.  He was noted to have gross hematuria with clots on bladder scan noted a third of a liter.  Patient was started on continuous bladder irrigation and urology was consulted.  Urology was able to irrigate the bladder with about a liter of normal saline and then the patient was set up for continuous bladder irrigation.  He was also transfused 1 unit packed red blood cells.  His Eliquis was held.  Following transfusion, patient's hemoglobin improved to 8.6, and by this afternoon, down to 7.9.  In discussion with urology, they suspect that he has metastatic prostate cancer.  CT scan of abdomen pelvis notes multiple areas of metastases.  PSA came back on 1/18 greater than 2000.  Oncology consulted & and have ordered CT scan of chest which is pending as well as bone scan which shows diffuse bone metastases.  Vascular surgery placed IVC filter on 1/18.  Orthopedics consulted for possible vertebroplasty at L1 site for pathologic fracture.   Subjective/interval history: Pt reported not feeling well.  Had some nausea earlier.  Still not wanting to eat.  Nothing specific.  No fever, dyspnea, chest pain, abdominal pain, diarrhea, dysuria, increased swelling.     Assessment/Plan: Principal Problem:   Hematuria and acute blood loss anemia in patient with prostate cancer on anticoagulation:  Anticoagulation discontinued.  Being followed by urology.  Hemoglobin remaining stable for now. --continue bladder irrigation, per urology, still outputting pink urine.    CAD (coronary artery disease) stable.    Insulin dependent type 2 diabetes mellitus ([place]): On sliding scale.    Essential hypertension: Blood pressures have been stable to slightly on the high end.  Continue home medications.    CKD (chronic kidney disease) stage 3a, GFR 30-59 ml/min: Stable.,  At baseline.    Chronic diastolic CHF (congestive heart failure) [facility]): Monitor closely.  Given blood and IVC fluid, likely could become hypervolemic easily.  Diurese as appropriate.    Elevated LFTs  Metastatic prostate cancer with widespread metastases to bone as well as liver:  --PSA greater than 2000.  Oncology discussing treatment options.   The patient's wife passed away in the last year from pancreatic cancer.  Discussed with daughter.  She used hospice at the end.  This may be an option for this patient  Pathologic L1 fracture minimally symptomatic --Ortho consulted, and rec observation    OSA on CPAP: Nightly CPAP.   Dyslipidemia: Continue statin.    Morbid obesity ([place]): Meets criteria for BMI greater than 35+ diabetes and hypertension and heart failure.    DVT (deep venous thrombosis) ([place]): Diagnosed 3 months ago.  Can no longer be on anticoagulation.  Vascular surgery placed IVC filter on 1/18   Code Status: Full code --Contact palliative care for consult  Family Communication: Updated daughter at the bedside  Disposition Plan: Need PT/OT after bladder irrigation is done, disposition undetermined at this time.  Consultants:  Urology  Vascular surgery  Oncology  Orthopedics  Procedures:  Bladder irrigation started 1/15  IVC filter placement 1/18  Antimicrobials:  None  DVT prophylaxis: SCDs   Objective: Vitals:   11/10/19 0452 11/10/19 1604  BP: (!) 174/79 (!) 144/55  Pulse: 72 76   Resp: 16 18  Temp: 98.2 F (36.8 C) 98.4 F (36.9 C)  SpO2: 98% 99%    Intake/Output Summary (Last 24 hours) at 11/10/2019 1955 Last data filed at 11/10/2019 1902 Gross per 24 hour  Intake 12120 ml  Output 10250 ml  Net 1870 ml   Filed Weights   11/04/19 1241 11/05/19 0135  Weight: 123.4 kg 115.4 kg   Body mass index is 35.48 kg/m.  Exam: Constitutional: NAD, AAOx3, lethargic HEENT: conjunctivae and lids normal, EOMI CV: RRR no M,R,G. Distal pulses +2.  No cyanosis.   RESP: CTA B/L, normal respiratory effort  GI: +BS, NTND Extremities: No effusions, edema, or tenderness in BLE SKIN: warm.  Skin over both lower legs have severe venous stasis changes, dry, leathery, at high risk of cracking Neuro: II - XII grossly intact.  Sensation intact Psych: Normal mood and affect.  Appropriate judgement and reason Bladder irrigation still outputting pink urine.   Data Reviewed: CBC: Recent Labs  Lab 11/04/19 1248 11/05/19 0020 11/05/19 1500 11/06/19 0514 11/07/19 0645 11/08/19 0520 11/10/19 0506  WBC 11.9*  --   --  12.1* 11.2* 10.1 10.5  NEUTROABS 9.8*  --   --   --   --   --   --   HGB 8.3*   < > 7.9* 7.7* 7.7* 7.8* 7.9*  HCT 26.1*   < > 24.1* 24.0* 24.5* 24.8* 25.3*  MCV 85.9  --   --  86.6 86.9 87.9 87.2  PLT 262  --   --  247 272 285 334   < > = values in this interval not displayed.   Basic Metabolic Panel: Recent Labs  Lab 11/04/19 1248 11/05/19 0623 11/06/19 0514 11/07/19 0645 11/10/19 0506  NA 134* 135 137 138 138  K 4.6 4.4 4.6 4.4 4.0  CL 100 101 104 105 104  CO2 24 24 24 24 25   GLUCOSE 136* 154* 128* 158* 170*  BUN 27* 29* 27* 23 14  CREATININE 1.98* 1.61* 1.62* 1.45* 1.44*  CALCIUM 9.3 9.4 9.2 9.3 9.5  MG  --   --   --   --  1.8   GFR: Estimated Creatinine Clearance: 48.4 mL/min (A) (by C-G formula based on SCr of 1.44 mg/dL (H)). Liver Function Tests: Recent Labs  Lab 11/04/19 1248 11/07/19 0645  AST 43* 23  ALT 45* 31  ALKPHOS 159* 111   BILITOT 1.1 1.0  PROT 7.0 6.2*  ALBUMIN 3.2* 2.7*   Recent Labs  Lab 11/04/19 1248  LIPASE 20   No results for input(s): AMMONIA in the last 168 hours. Coagulation Profile: Recent Labs  Lab 11/04/19 1527  INR 2.0*   Cardiac Enzymes: No results for input(s): CKTOTAL, CKMB, CKMBINDEX, TROPONINI in the last 168 hours. BNP (last 3 results) No results for input(s): PROBNP in the last 8760 hours. HbA1C: No results for input(s): HGBA1C in the last 72 hours. CBG: Recent Labs  Lab 11/09/19 1630 11/09/19 2114 11/10/19 0742 11/10/19 1150 11/10/19 1650  GLUCAP 167* 150* 146* 139* 188*   Lipid Profile: No results for input(s): CHOL, HDL, LDLCALC, TRIG, CHOLHDL, LDLDIRECT in the last 72 hours. Thyroid Function Tests: No results for input(s):  TSH, T4TOTAL, FREET4, T3FREE, THYROIDAB in the last 72 hours. Anemia Panel: Recent Labs    11/08/19 0520  VITAMINB12 563  FOLATE 7.7  FERRITIN 1,454*  TIBC 139*  IRON 27*  RETICCTPCT 2.8   Urine analysis:    Component Value Date/Time   COLORURINE RED (A) 11/06/2019 1039   APPEARANCEUR HAZY (A) 11/06/2019 1039   LABSPEC 1.005 11/06/2019 1039   PHURINE  11/06/2019 1039    TEST NOT REPORTED DUE TO COLOR INTERFERENCE OF URINE PIGMENT   GLUCOSEU (A) 11/06/2019 1039    TEST NOT REPORTED DUE TO COLOR INTERFERENCE OF URINE PIGMENT   HGBUR (A) 11/06/2019 1039    TEST NOT REPORTED DUE TO COLOR INTERFERENCE OF URINE PIGMENT   BILIRUBINUR (A) 11/06/2019 1039    TEST NOT REPORTED DUE TO COLOR INTERFERENCE OF URINE PIGMENT   KETONESUR (A) 11/06/2019 1039    TEST NOT REPORTED DUE TO COLOR INTERFERENCE OF URINE PIGMENT   PROTEINUR (A) 11/06/2019 1039    TEST NOT REPORTED DUE TO COLOR INTERFERENCE OF URINE PIGMENT   NITRITE (A) 11/06/2019 1039    TEST NOT REPORTED DUE TO COLOR INTERFERENCE OF URINE PIGMENT   LEUKOCYTESUR (A) 11/06/2019 1039    TEST NOT REPORTED DUE TO COLOR INTERFERENCE OF URINE PIGMENT   Sepsis  Labs: @LABRCNTIP (procalcitonin:4,lacticidven:4)  ) Recent Results (from the past 240 hour(s))  Urine culture     Status: Abnormal   Collection Time: 11/04/19  3:52 PM   Specimen: Urine, Random  Result Value Ref Range Status   Specimen Description   Final    URINE, RANDOM Performed at [facility], [address], [place], [place] [address]    Special Requests   Final    NONE Performed at [facility], [address] [place], [place] [address]    Culture (A)  Final    <10,000 COLONIES/mL INSIGNIFICANT GROWTH Performed at [facility], [place] [address], [place], [place] [address]    Report Status 11/05/2019 FINAL  Final  SARS CORONAVIRUS 2 (TAT 6-24 HRS) Nasopharyngeal Nasopharyngeal Swab     Status: None   Collection Time: 11/04/19  8:06 PM   Specimen: Nasopharyngeal Swab  Result Value Ref Range Status   SARS Coronavirus 2 NEGATIVE NEGATIVE Final    Comment: (NOTE) SARS-CoV-2 target nucleic acids are NOT DETECTED. The SARS-CoV-2 RNA is generally detectable in upper and lower respiratory specimens during the acute phase of infection. Negative results do not preclude SARS-CoV-2 infection, do not rule out co-infections with other pathogens, and should not be used as the sole basis for treatment or other patient management decisions. Negative results must be combined with clinical observations, patient history, and epidemiological information. The expected result is Negative. Fact Sheet for Patients: SugarRoll.be Fact Sheet for Healthcare Providers: https://www.woods-mathews.com/ This test is not yet approved or cleared by the [place] FDA and  has been authorized for detection and/or diagnosis of SARS-CoV-2 by FDA under an Emergency Use Authorization (EUA). This EUA will remain  in effect (meaning this test can be used) for the duration of the COVID-19 declaration under Section 56 4(b)(1) of the Act, 21  U.S.C. section 360bbb-3(b)(1), unless the authorization is terminated or revoked sooner. Performed at [facility], [place] [address] [place], [place] [address]   Urine culture     Status: None   Collection Time: 11/06/19 10:39 AM   Specimen: Urine, Random  Result Value Ref Range Status   Specimen Description   Final  URINE, RANDOM Performed at [facility], [address], [place], [place] [address]    Special Requests   Final    NONE Performed at [facility], [address], [place], [place] [address]    Culture   Final    NO GROWTH Performed at [facility], [place] [address] [place],  [address]    Report Status 11/07/2019 FINAL  Final      Studies: No results found.  Scheduled Meds: . atorvastatin  80 mg Oral Daily  . carvedilol  25 mg Oral BID WC  . Chlorhexidine Gluconate Cloth  6 each Topical Daily  . DULoxetine  60 mg Oral Daily  . feeding supplement (ENSURE ENLIVE)  237 mL Oral BID BM  . finasteride  5 mg Oral Daily  . insulin aspart  0-15 Units Subcutaneous TID WC    Continuous Infusions: . sodium chloride irrigation Stopped (11/05/19 0035)     LOS: 6 days     Enzo Bi, MD [facility]   11/10/2019, 7:55 PM

## 2019-11-10 NOTE — Progress Notes (Signed)
Tumor Board Documentation  [name] was presented by Dr Mike Gip at our Tumor Board on 11/10/2019, which included representatives from medical oncology, radiation oncology, navigation, pathology, radiology, surgical, surgical oncology, internal medicine, pharmacy, genetics, palliative care, research.  [name] currently presents for discussion, for progression, as a new patient with history of the following treatments: active survellience.  Additionally, we reviewed previous medical and familial history, history of present illness, and recent lab results along with all available histopathologic and imaging studies. The tumor board considered available treatment options and made the following recommendations: Biopsy, Immunotherapy CT Guided Biopsy  The following procedures/referrals were also placed: No orders of the defined types were placed in this encounter.   Clinical Trial Status: not discussed   Staging used: To be determined  National site-specific guidelines   were discussed with respect to the case.  Tumor board is a meeting of clinicians from various specialty areas who evaluate and discuss patients for whom a multidisciplinary approach is being considered. Final determinations in the plan of care are those of the provider(s). The responsibility for follow up of recommendations given during tumor board is that of the provider.   Today's extended care, comprehensive team conference, [name] was not present for the discussion and was not examined.   Multidisciplinary Tumor Board is a multidisciplinary case peer review process.  Decisions discussed in the Multidisciplinary Tumor Board reflect the opinions of the specialists present at the conference without having examined the patient.  Ultimately, treatment and diagnostic decisions rest with the primary provider(s) and the patient.

## 2019-11-10 NOTE — Care Management Important Message (Signed)
Important Message  Patient Details  Name: [name] MRN: [id number] Date of Birth: [birth year]   Medicare Important Message Given:  Yes     Juliann Pulse A Queenie Aufiero 11/10/2019, 10:52 AM

## 2019-11-10 NOTE — Progress Notes (Signed)
Urology Inpatient Progress Note  Subjective: [name] is a 86 y.o. comorbid male admitted on 11/04/2019 with gross hematuria x5 days on multiple anticoagulants, now holding anticoagulation and on CBI.  PSA >2000 with CT findings suggestive of metastatic prostate cancer.  CBI off today with both saline bags clamped.  Cherry red urinary output in his drainage tubing.  He reports feeling well today, no acute concerns.  Patient repeatedly attempting to turn over in bed during exam today.  Anti-infectives: Anti-infectives (From admission, onward)   Start     Dose/Rate Route Frequency Ordered Stop   11/08/19 0000  ceFAZolin (ANCEF) IVPB 1 g/50 mL premix    Note to Pharmacy: To be given in specials   1 g 100 mL/hr over 30 Minutes Intravenous  Once 11/07/19 1428 11/07/19 1723      Current Facility-Administered Medications  Medication Dose Route Frequency Provider Last Rate Last Admin  . atorvastatin (LIPITOR) tablet 80 mg  80 mg Oral Daily Algernon Huxley, MD   80 mg at 11/08/19 1901  . carvedilol (COREG) tablet 25 mg  25 mg Oral BID WC Algernon Huxley, MD   25 mg at 11/10/19 0842  . Chlorhexidine Gluconate Cloth 2 % PADS 6 each  6 each Topical Daily Algernon Huxley, MD   6 each at 11/08/19 0957  . DULoxetine (CYMBALTA) DR capsule 60 mg  60 mg Oral Daily Algernon Huxley, MD   60 mg at 11/10/19 0842  . feeding supplement (ENSURE ENLIVE) (ENSURE ENLIVE) liquid 237 mL  237 mL Oral BID BM Enzo Bi, MD      . finasteride (PROSCAR) tablet 5 mg  5 mg Oral Daily Algernon Huxley, MD   5 mg at 11/10/19 0842  . insulin aspart (novoLOG) injection 0-15 Units  0-15 Units Subcutaneous TID WC Algernon Huxley, MD   2 Units at 11/10/19 [phone]  . ondansetron (ZOFRAN) injection 4 mg  4 mg Intravenous Q6H PRN Algernon Huxley, MD   4 mg at 11/09/19 1603  . opium-belladonna (B&O SUPPRETTES) 16.2-60 MG suppository 1 suppository  1 suppository Rectal Q8H PRN Algernon Huxley, MD      . oxyCODONE-acetaminophen (PERCOCET/ROXICET) 5-325 MG  per tablet 1 tablet  1 tablet Oral Q8H PRN Ihor Dow M, NP      . sodium chloride irrigation 0.9 % 3,000 mL  3,000 mL Irrigation Continuous Algernon Huxley, MD   Stopped at 11/05/19 0035   Objective: Vital signs in last 24 hours: Temp:  [97.9 F (36.6 C)-98.3 F (36.8 C)] 98.2 F (36.8 C) (01/21 0452) Pulse Rate:  [66-72] 72 (01/21 0452) Resp:  [16] 16 (01/21 0452) BP: (145-174)/(65-79) 174/79 (01/21 0452) SpO2:  [96 %-98 %] 98 % (01/21 0452)  Intake/Output from previous day: 01/20 0701 - 01/21 0700 In: 3420 [P.O.:120] Out: 8625 [Urine:8625] Intake/Output this shift: Total I/O In: 2000 [Other:2000] Out: 850 [Urine:850]  Physical Exam Vitals and nursing note reviewed.  Constitutional:      General: He is not in acute distress.    Appearance: He is not ill-appearing, toxic-appearing or diaphoretic.  HENT:     Head: Normocephalic and atraumatic.  Pulmonary:     Effort: Pulmonary effort is normal. No respiratory distress.  Skin:    General: Skin is warm and dry.  Neurological:     Mental Status: He is alert.    Lab Results:  Recent Labs    11/08/19 0520 11/10/19 0506  WBC 10.1 10.5  HGB  7.8* 7.9*  HCT 24.8* 25.3*  PLT 285 334   BMET Recent Labs    11/10/19 0506  NA 138  K 4.0  CL 104  CO2 25  GLUCOSE 170*  BUN 14  CREATININE 1.44*  CALCIUM 9.5   Assessment & Plan: 86 year old male admitted with gross hematuria on multiple anticoagulants, now holding anticoagulation s/p IVC filter placement on CBI.  Hemoglobin stable.  Gross hematuria returned today with CBI off.  It is unclear when CBI was turned off.  I turned CBI back on at full flow and noted immediate clearing of his urinary output.  There was a thin, mucoid thread of clot material visualized within the drainage tubing but not occluding it, no other clot material visualized.  I turned down CBI to slow drip with maintenance of clear urinary output.  Continue slow drip CBI for now.  Will attempt to turn  it off again this afternoon.  Given patient's continued turning in bed today, I suspect recurrence of gross hematuria may have been associated with inadvertent catheter tugging or irritation.  Debroah Loop, PA-C 11/10/2019

## 2019-11-10 NOTE — Progress Notes (Signed)
Daily Progress Note   Patient Name: [name]       Date: 11/10/2019 DOB: 04-06-1932  Age: 86 y.o. MRN#: [id number] Attending Physician: Enzo Bi, MD Primary Care Physician: Hillis Range Admit Date: 11/04/2019  Reason for Consultation/Follow-up: Establishing goals of care  Subjective: Patient awake/alert/oriented but tired following meeting with attorney. C/o pain, RN to give prn percocet. Eating bites of fruit.   Daughter at bedside. Provided update regarding plan of care and recommendation from specialists. Daughter is waiting to hear back from oncology and hopeful to talk to [name] this afternoon. She wishes to further discuss options and prognosis. Daughter is tearful throughout conversation. Hard Choices booklet given. Reassured of ongoing palliative support this admission. I did not discuss in detail goals/code status as patient is very tired following meeting with attorney.   Length of Stay: 6  Current Medications: Scheduled Meds:  . atorvastatin  80 mg Oral Daily  . carvedilol  25 mg Oral BID WC  . Chlorhexidine Gluconate Cloth  6 each Topical Daily  . DULoxetine  60 mg Oral Daily  . feeding supplement (ENSURE ENLIVE)  237 mL Oral BID BM  . finasteride  5 mg Oral Daily  . insulin aspart  0-15 Units Subcutaneous TID WC    Continuous Infusions: . sodium chloride irrigation Stopped (11/05/19 0035)    PRN Meds: ondansetron (ZOFRAN) IV, opium-belladonna, oxyCODONE-acetaminophen  Physical Exam Vitals and nursing note reviewed.  Constitutional:      General: He is awake.     Appearance: He is ill-appearing.  HENT:     Head: Normocephalic and atraumatic.  Pulmonary:     Effort: No tachypnea, accessory muscle usage or respiratory distress.   Neurological:     Mental Status: He is alert and oriented to person, place, and time.            Vital Signs: BP (!) 174/79 (BP Location: Left Arm)   Pulse 72   Temp 98.2 F (36.8 C) (Oral)   Resp 16   Ht 5\' 11"  (1.803 m)   Wt 115.4 kg   SpO2 98%   BMI 35.48 kg/m  SpO2: SpO2: 98 % O2 Device: O2 Device: Room Air O2 Flow Rate: O2 Flow Rate (L/min): 6 L/min  Intake/output summary:   Intake/Output Summary (Last 24 hours) at  11/10/2019 1349 Last data filed at 11/10/2019 1248 Gross per 24 hour  Intake 9420 ml  Output 7125 ml  Net 2295 ml   LBM: Last BM Date: 11/07/19 Baseline Weight: Weight: 123.4 kg Most recent weight: Weight: 115.4 kg       Palliative Assessment/Data: PPS 50%      Patient Active Problem List   Diagnosis Date Noted  . Palliative care by specialist   . Goals of care, counseling/discussion   . Prostate cancer metastatic to bone ([place]) 11/07/2019  . Elevated INR 11/05/2019  . Acute blood loss anemia 11/05/2019  . OSA on CPAP 11/05/2019  . Dyslipidemia 11/05/2019  . Morbid obesity ([place]) 11/05/2019  . DVT (deep venous thrombosis) ([place]) 11/05/2019  . Hematuria 11/04/2019  . Hepatitis C antibody test positive 10/20/2019  . Anasarca 10/15/2019  . Acute exacerbation of CHF (congestive heart failure) ([place]) 10/15/2019  . CAD (coronary artery disease) 10/15/2019  . Insulin dependent type 2 diabetes mellitus ([place]) 10/15/2019  . Essential hypertension 10/15/2019  . Chronic anticoagulation 10/15/2019  . Acute kidney injury superimposed on CKD ([place]) 10/15/2019  . CKD (chronic kidney disease) stage 3, GFR 30-59 ml/min 10/15/2019  . Generalized weakness 10/15/2019  . Frequent falls 10/15/2019  . Elevated liver enzymes 10/15/2019  . Chronic diastolic CHF (congestive heart failure) ([place]) 10/15/2019  . Elevated LFTs     Palliative Care Assessment & Plan   Patient Profile: 86 y.o. male  with past medical history of CAD s/p stents, chronic dCHF, OSA, DM type  2, CKD stage III, recent DVT on eliquis, prostate cancer admitted on 11/04/2019 with hematuria x5 days. Urology consulted. Patient started on CBI. Urology suspected metastatic prostate cancer. PSA 1/18 greater than 2000. CT ab/pelvis revealed multiple areas of metastases. IVC filter placed on 1/18. Ortho following for L1 pathologic fracture with plans for observation at this time. Oncology consulted and following. Palliative medicine consultation for goals of care.   Assessment: Prostate cancer with widespread mets to bone and liver Hematuria Pathologic L1 fracture CAD CKD stage 3a Chronic dCHF Elevated LFT's DM type 2 OSA on CPAP DVT s/p IVC filter  Recommendations/Plan: Continue FULL code/FULL scope treatment Patient/daughter have not made decisions yet about pursuing oncology work-up. Daughter plans to further discuss with [name] when available.  Attorney at bedside this afternoon and completed AD/living will/HCPOA paperwork with patient.  Ongoing palliative discussions pending oncology f/u. Needs further code status discussions (did not discuss today, as patient spent hours with attorney this afternoon and tired following this conversation). Outpatient palliative referral on discharge.    Code Status: FULL   Code Status Orders  (From admission, onward)         Start     Ordered   11/04/19 2313  Full code  Continuous     11/04/19 2313        Code Status History    Date Active Date Inactive Code Status Order ID Comments User Context   10/15/2019 0114 10/20/2019 2141 Full Code [id number]  Athena Masse, MD ED   Advance Care Planning Activity    Advance Directive Documentation     Most Recent Value  Type of Advance Directive  Healthcare Power of [place], Living will  Pre-existing out of facility DNR order (yellow form or pink MOST form)  --  "MOST" Form in Place?  --      Prognosis:  Poor long-term prognosis  Discharge Planning: To Be Determined  Care  plan was discussed with RN, patient,  daughter [name])  Thank you for allowing the Palliative Medicine Team to assist in the care of this patient.   Time In: 1515- Time Out: 1550 Total Time 35 Prolonged Time Billed no      Greater than 50%  of this time was spent counseling and coordinating care related to the above assessment and plan.  Ihor Dow, DNP, FNP-C Palliative Medicine Team  Phone: [phone] Fax: [phone]  Please contact Palliative Medicine Team phone at [phone] for questions and concerns.

## 2019-11-10 NOTE — Progress Notes (Signed)
OT Cancellation Note  Patient Details Name: [name] MRN: [id number] DOB: [birth year]   Cancelled Treatment:    Reason Eval/Treat Not Completed: Medical issues which prohibited therapy. OT continues to follow pt. Per chart, patient remains on CBI; contraindicated for functional mobility. Will continue to follow and initiate as medically appropriate.  Shara Blazing, M.S., OTR/L Ascom: [phone] 11/10/19, 11:47 AM

## 2019-11-10 NOTE — Progress Notes (Signed)
Pt. urinary output  From drainage bag is looking pink. No blood clots noted.

## 2019-11-10 NOTE — Progress Notes (Signed)
PT Cancellation Note  Patient Details Name: [name] MRN: [id number] DOB: [birth year]   Cancelled Treatment:    Reason Eval/Treat Not Completed: Medical issues which prohibited therapy(Per primary RN, patient remains on CBI; contraindicated for mobility.  Will continue to follow and initiate as medically appropriate.)   Charina Fons H. Owens Shark, PT, DPT, NCS 11/10/19, 10:21 AM [phone]

## 2019-11-11 ENCOUNTER — Other Ambulatory Visit: Payer: Self-pay | Admitting: Hematology and Oncology

## 2019-11-11 ENCOUNTER — Telehealth: Payer: Self-pay | Admitting: Pharmacist

## 2019-11-11 ENCOUNTER — Telehealth: Payer: Self-pay | Admitting: Pharmacy Technician

## 2019-11-11 DIAGNOSIS — C61 Malignant neoplasm of prostate: Secondary | ICD-10-CM

## 2019-11-11 DIAGNOSIS — C7951 Secondary malignant neoplasm of bone: Secondary | ICD-10-CM

## 2019-11-11 LAB — MAGNESIUM: Magnesium: 1.9 mg/dL (ref 1.7–2.4)

## 2019-11-11 LAB — BASIC METABOLIC PANEL
Anion gap: 10 (ref 5–15)
BUN: 16 mg/dL (ref 8–23)
CO2: 23 mmol/L (ref 22–32)
Calcium: 9.3 mg/dL (ref 8.9–10.3)
Chloride: 107 mmol/L (ref 98–111)
Creatinine, Ser: 1.54 mg/dL — ABNORMAL HIGH (ref 0.61–1.24)
GFR calc Af Amer: 47 mL/min — ABNORMAL LOW (ref >60)
GFR calc non Af Amer: 41 mL/min — ABNORMAL LOW (ref >60)
Glucose, Bld: 160 mg/dL — ABNORMAL HIGH (ref 70–99)
Potassium: 4.4 mmol/L (ref 3.5–5.1)
Sodium: 140 mmol/L (ref 135–145)

## 2019-11-11 LAB — CBC
HCT: 24.7 % — ABNORMAL LOW (ref 39.0–52.0)
Hemoglobin: 7.8 g/dL — ABNORMAL LOW (ref 13.0–17.0)
MCH: 27.7 pg (ref 26.0–34.0)
MCHC: 31.6 g/dL (ref 30.0–36.0)
MCV: 87.6 fL (ref 80.0–100.0)
Platelets: 304 10*3/uL (ref 150–400)
RBC: 2.82 MIL/uL — ABNORMAL LOW (ref 4.22–5.81)
RDW: 15.3 % (ref 11.5–15.5)
WBC: 10.5 10*3/uL (ref 4.0–10.5)
nRBC: 0 % (ref 0.0–0.2)

## 2019-11-11 LAB — GLUCOSE, CAPILLARY
Glucose-Capillary: 136 mg/dL — ABNORMAL HIGH (ref 70–99)
Glucose-Capillary: 137 mg/dL — ABNORMAL HIGH (ref 70–99)
Glucose-Capillary: 152 mg/dL — ABNORMAL HIGH (ref 70–99)
Glucose-Capillary: 239 mg/dL — ABNORMAL HIGH (ref 70–99)

## 2019-11-11 MED ORDER — DEGARELIX ACETATE(240 MG DOSE) 120 MG/VIAL ~~LOC~~ SOLR
240.0000 mg | Freq: Once | SUBCUTANEOUS | Status: AC
  Administered 2019-11-11: 240 mg via SUBCUTANEOUS
  Filled 2019-11-11: qty 6

## 2019-11-11 MED ORDER — ABIRATERONE ACETATE 250 MG PO TABS: 1000.0000 mg | ORAL_TABLET | Freq: Every day | ORAL | 0 refills | Status: DC

## 2019-11-11 MED ORDER — PREDNISONE 5 MG PO TABS: 5.0000 mg | ORAL_TABLET | Freq: Every day | ORAL | 0 refills | Status: DC

## 2019-11-11 MED FILL — ABIRATERONE ACETATE 250 MG: 250 | 30 days supply | Qty: 120 | Fill #0

## 2019-11-11 NOTE — Progress Notes (Signed)
Discussed the patient's CBI with Vaillancourt. She ordered to unclamp CBI if urine is darker than pink. They want the urine to be pink or lighter but keep the rate of irrigationas slow as possible

## 2019-11-11 NOTE — Telephone Encounter (Signed)
Oral Oncology Patient Advocate Encounter  Was successful in securing patient a $8,000 grant from [employer] to provide copayment coverage for Zytiga.  This will keep the out of pocket expense at $0.     [employer] ID: [id number]  I have spoken with the patient.   The billing information is as follows and has been shared with [place].    RxBin: [id number] PCN: [id number] Member ID: [id number] Group ID: [id number] Dates of Eligibility: 10/12/2019 through 10/10/2020  Fund:  [place] [facility] [place] Phone [phone] Fax [phone] 11/11/2019 3:51 PM

## 2019-11-11 NOTE — Progress Notes (Signed)
START ON PATHWAY REGIMEN - Prostate     A cycle is every 28 days:     Abiraterone acetate (conventional)      Prednisone   **Always confirm dose/schedule in your pharmacy ordering system**  Patient Characteristics: Adenocarcinoma, Distant Metastases, Hormone Naive Histology: Adenocarcinoma Therapeutic Status: Distant Metastases  Intent of Therapy: Non-Curative / Palliative Intent, Discussed with Patient

## 2019-11-11 NOTE — Progress Notes (Signed)
Notified Dr. Billie Ruddy patient BP is 164/71. Carvedilol given. No new orders.

## 2019-11-11 NOTE — Progress Notes (Signed)
Patient's 3-way foley bag had a hole in it. There was 1500 mL of normal saline that went in and 350 mL was the output. Lots of pink urine was on the floor and no way know the exact amount. The bag has been changed and will continue to monitor patient.

## 2019-11-11 NOTE — Progress Notes (Signed)
Urology Inpatient Progress Note  Subjective: [name] is a 86 y.o. male admitted on 11/04/2019 with gross hematuria x5 days on multiple anticoagulants, now holding anticoagulation and s/p IVC filter placement on CBI.  PSA >2000 with CT findings suggestive of metastatic prostate cancer, case reviewed by tumor board yesterday.  Fast drip CBI this morning with pale pink urinary output.  No acute concerns today.  Patient appears fatigued.  Anti-infectives: Anti-infectives (From admission, onward)   Start     Dose/Rate Route Frequency Ordered Stop   11/08/19 0000  ceFAZolin (ANCEF) IVPB 1 g/50 mL premix    Note to Pharmacy: To be given in specials   1 g 100 mL/hr over 30 Minutes Intravenous  Once 11/07/19 1428 11/07/19 1723      Current Facility-Administered Medications  Medication Dose Route Frequency Provider Last Rate Last Admin  . atorvastatin (LIPITOR) tablet 80 mg  80 mg Oral Daily Algernon Huxley, MD   80 mg at 11/10/19 1805  . carvedilol (COREG) tablet 25 mg  25 mg Oral BID WC Algernon Huxley, MD   25 mg at 11/11/19 0754  . Chlorhexidine Gluconate Cloth 2 % PADS 6 each  6 each Topical Daily Algernon Huxley, MD   6 each at 11/10/19 1751  . DULoxetine (CYMBALTA) DR capsule 60 mg  60 mg Oral Daily Algernon Huxley, MD   60 mg at 11/11/19 0754  . feeding supplement (ENSURE ENLIVE) (ENSURE ENLIVE) liquid 237 mL  237 mL Oral BID BM Enzo Bi, MD   237 mL at 11/11/19 0753  . finasteride (PROSCAR) tablet 5 mg  5 mg Oral Daily Algernon Huxley, MD   5 mg at 11/11/19 0754  . insulin aspart (novoLOG) injection 0-15 Units  0-15 Units Subcutaneous TID WC Algernon Huxley, MD   2 Units at 11/11/19 0753  . ondansetron (ZOFRAN) injection 4 mg  4 mg Intravenous Q6H PRN Algernon Huxley, MD   4 mg at 11/09/19 1603  . opium-belladonna (B&O SUPPRETTES) 16.2-60 MG suppository 1 suppository  1 suppository Rectal Q8H PRN Algernon Huxley, MD      . oxyCODONE-acetaminophen (PERCOCET/ROXICET) 5-325 MG per tablet 1 tablet  1 tablet  Oral Q8H PRN Basilio Cairo, NP   1 tablet at 11/10/19 1540  . sodium chloride irrigation 0.9 % 3,000 mL  3,000 mL Irrigation Continuous Algernon Huxley, MD   Stopped at 11/05/19 0035    Objective: Vital signs in last 24 hours: Temp:  [97.8 F (36.6 C)-98.8 F (37.1 C)] 97.8 F (36.6 C) (01/22 0457) Pulse Rate:  [67-76] 67 (01/22 0755) Resp:  [18] 18 (01/22 0457) BP: (137-159)/(55-71) 142/62 (01/22 0755) SpO2:  [97 %-100 %] 97 % (01/22 0457)  Intake/Output from previous day: 01/21 0701 - 01/22 0700 In: 24400  Out: 15700 [Urine:15700] Intake/Output this shift: Total I/O In: 900 [Other:900] Out: 1200 [Urine:1200]  Physical Exam Vitals and nursing note reviewed.  Constitutional:      General: He is not in acute distress.    Appearance: He is not ill-appearing, toxic-appearing or diaphoretic.  HENT:     Head: Normocephalic and atraumatic.  Pulmonary:     Effort: Pulmonary effort is normal. No respiratory distress.  Skin:    General: Skin is warm and dry.    Lab Results:  Recent Labs    11/10/19 0506 11/11/19 0535  WBC 10.5 10.5  HGB 7.9* 7.8*  HCT 25.3* 24.7*  PLT 334 304   BMET Recent  Labs    11/10/19 0506 11/11/19 0535  NA 138 140  K 4.0 4.4  CL 104 107  CO2 25 23  GLUCOSE 170* 160*  BUN 14 16  CREATININE 1.44* 1.54*  CALCIUM 9.5 9.3   Assessment & Plan: 86 year old male admitted with gross hematuria on multiple anticoagulants, now s/p IVC filter placement and holding anticoagulation on CBI.  Appreciate oncology and palliative care input.  I turned off CBI this morning and hand irrigated his catheter with 500 mL of sterile water.  I was able to irrigate <16mL clot material.  Urine returned to light pink following irrigation.  Keeping CBI off for now, will recheck patient this afternoon.  Debroah Loop, PA-C 11/11/2019

## 2019-11-11 NOTE — Evaluation (Signed)
Occupational Therapy Evaluation Patient Details Name: [name] MRN: [id number] DOB: August 02, 1932 Today's Date: 11/11/2019    History of Present Illness Pt is 86 y/o M with PMH: CAD s/p stent, dCHF, sleep apnea, T2DM, hx DVT on Eliquis, and CKD3 who presented with hematuria. Pt started on continuous bladder irrigation on 1/15. CT revaled metastatic prostate cancer 1/17. Pt now with CBI clamped 1/22 with plan to discontinue if pt responding well. RN okay'ed bed level assessment.   Clinical Impression   Pt was seen for OT evaluation this date. Pt presented from SNF-[facility]-on this admission, pt's home is apparently [facility] with 3 STE in which he lives with his daughter and uses 2WW for fxl mobility. Pt is somewhat poor historian. Currently pt demonstrates impairments as described below (See OT problem list) which functionally limit his ability to perform ADL/self-care tasks. Pt currently requires MOD/MAX A for in-bed repositioning/propulsion towards HOB, setup for feeding/grooming, and MAX to total A with LB ADLs including toileting and dressing based on clinical observation.  Pt would benefit from skilled OT to address noted impairments and functional limitations (see below for any additional details) in order to maximize safety and independence while minimizing falls risk and caregiver burden. Upon hospital discharge, recommend pt d/c to SNF to encourage improved pt independence with self care ADLs/ADL mobility to highest attainable level.    Follow Up Recommendations  SNF    Equipment Recommendations  Other (comment)(unclear if pt does or does not have BSC, will likely require one in any d/c setting.)    Recommendations for Other Services       Precautions / Restrictions Precautions Precautions: Fall Restrictions Weight Bearing Restrictions: No      Mobility Bed Mobility Overal bed mobility: Needs Assistance             General bed mobility comments: did not assess  sup<>sit transfer as pt is still connected to CBI (clamped during assessment). Did have pt assist with propulsion towards HOB with bed level slightly in trend. Pt requires MOD/MAX A for propulsion-uses UEs to contribute.  Transfers                 General transfer comment: deferred at this time as pt still connected to CBI, cleared for bed level assessment at this time only.    Balance                                           ADL either performed or assessed with clinical judgement   ADL                                         General ADL Comments: Pt restricted for sitting EOB/OOB mobility/activity this date as he is still hooked to continuous bladder irrigation. Pt is cleared for bed level assessment. Pt is able to perform self feeding and wash face with wash cloth with setup.     Vision Patient Visual Report: No change from baseline Additional Comments: difficult to formally assess, tracks appropriately     Perception     Praxis      Pertinent Vitals/Pain Pain Assessment: No/denies pain     Hand Dominance Right   Extremity/Trunk Assessment Upper Extremity Assessment Upper Extremity Assessment: Generalized weakness(able to move through full arc of  motion, grip 3+/5)   Lower Extremity Assessment Lower Extremity Assessment: Defer to PT evaluation       Communication Communication Communication: No difficulties(somewhat sleepy this date)   Cognition Arousal/Alertness: Awake/alert Behavior During Therapy: WFL for tasks assessed/performed Overall Cognitive Status: No family/caregiver present to determine baseline cognitive functioning Area of Impairment: Memory;Following commands;Orientation                 Orientation Level: Disoriented to;Time(able to state month and year, but unable to states day of the week/date)   Memory: Decreased short-term memory Following Commands: Follows one step commands with increased  time       General Comments: Pt somewhat confused, some slow processing notable. Somewhat oriented-place, month, year, person. Pt is sleepy during assessment which is possibly impacting his responses. Pt primarily follows all one step commands. Tracks appropraitely with eyes.   General Comments       Exercises Other Exercises Other Exercises: OT attempts to engage pt in straight arm raises for 1 set x10 reps as well as forward punches for 1 set x10 reps. Pt partially participates with OT providing MIN/MOD multimodal cues for form, pace and technique. Pt very fatigued and stops at ~half way through each exercise. Other Exercises: OT facilitates education with pt re: role of OT in acute setting and in general with pt verbalizing understanding.   Shoulder Instructions      Home Living Family/patient expects to be discharged to:: Skilled nursing facility([facility]) Living Arrangements: Children(Cynthia, see below for details) Available Help at Discharge: Family;Personal care attendant Type of Home: House Home Access: Stairs to enter [employer] of Steps: 3 Entrance Stairs-Rails: Left;Right Home Layout: One level     Bathroom Shower/Tub: [place]: Shower seat;Cane - single point;Bedside commode;Walker - 2 wheels          Prior Functioning/Environment Level of Independence: Independent with assistive device(s)        Comments: Pt is somewhat questionable historian. Presented from [employer], but home layout information provided appears to pertain to house that he shares with dtr-Cynthia.        OT Problem List: Decreased strength;Increased edema;Decreased activity tolerance;Decreased safety awareness;Impaired balance (sitting and/or standing);Decreased knowledge of use of DME or AE;Obesity      OT Treatment/Interventions: Self-care/ADL training;Therapeutic exercise;Therapeutic activities;DME and/or AE  instruction;Patient/family education;Balance training    OT Goals(Current goals can be found in the care plan section) Acute Rehab OT Goals Patient Stated Goal: feel better OT Goal Formulation: With patient Time For Goal Achievement: 11/24/19 Potential to Achieve Goals: Good  OT Frequency: Min 1X/week   Barriers to D/C:            Co-evaluation              AM-PAC OT "6 Clicks" Daily Activity     Outcome Measure Help from another person eating meals?: A Little Help from another person taking care of personal grooming?: A Little Help from another person toileting, which includes using toliet, bedpan, or urinal?: Total Help from another person bathing (including washing, rinsing, drying)?: A Lot Help from another person to put on and taking off regular upper body clothing?: A Lot Help from another person to put on and taking off regular lower body clothing?: A Lot 6 Click Score: 13   End of Session    Activity Tolerance: Patient limited by fatigue;Treatment limited secondary to medical complications (Comment) Patient left: in bed;with call  bell/phone within reach;with bed alarm set  OT Visit Diagnosis: Muscle weakness (generalized) (M62.81);Other abnormalities of gait and mobility (R26.89)                Time: 7471-8550 OT Time Calculation (min): 20 min Charges:  OT General Charges $OT Visit: 1 Visit OT Evaluation $OT Eval Moderate Complexity: 1 Mod OT Treatments $Therapeutic Activity: 8-22 mins  Gerrianne Scale, MS, OTR/L ascom [phone] 11/11/19, 3:13 PM

## 2019-11-11 NOTE — Progress Notes (Signed)
[facility] Hematology/Oncology Progress Note  Date of admission: 11/04/2019  Hospital day:  11/11/2019  Chief Complaint: [name] is a 86 y.o. male who was admitted with hematuria.   Assessment:  [name] is a 86 y.o. male with metastatic prostate cancer.  He presented with gross hematuria.  PSA  was 2,241.0 on 11/06/2019.   Renal stone CT on 11/05/2019 revealed confluent nodular masses adjacent to the prostate and distal sigmoid colon with left pelvic, perirectal, retroperitoneal, and mesenteric adenopathy as well as probable peritoneal disease most marked in the right upper quadrant extending about the right diaphragmatic leaflet.  There was pelvic and suspected vertebral osseous metastatic disease with L1 compression fracture, likely pathologic.  There was a possible 2 cm right lower lobe lung nodule.  There was left hydronephrosis and proximal ureterectasis secondary to pelvic process (prior renal ultrasound on 10/16/2019 revealed no hydronephrosis).  Chest CT on 11/08/2019 revealed right pleural effusion with pleural metastasis (2.7 x 2.1 cm nodularity along anterior right hemidiaphragm).  Osseous metastasis, including a dominant lesion involving the right-side of the L2 vertebral body.   Bone scan on 11/08/2019 revealed widespread bony metastasis (right occiput, clavicles, left upper extremity, sternum, ribs bilaterally, throughout the cervical, thoracic, lumbar spine, bony pelvis, proximal femurs, right tibia).  The patient has a history of DVT approximately 3 months ago.  IVC filter was placed on 11/07/2019.  Plan:   1.   Metastatic prostatic prostate cancer             Spoke with patient's daughter last night.  She has spoken with the patient and they have agreed to initiation of degarelix.  I have also begun the process of obtaining abiraterone in the outpatient department.  We discussed a family meeting on Saturday, 11/12/2019. 2.   Hematuria             Aspirin, Plavix, and Eliquis are held.             Patient s/p IVC filter placement on 11/07/2019. 3.   Normocytic anemia             Hematocrit 24.8.  Hemoglobin 7.8.  MCV 87.9 on 11/08/2019.   Hematocrit 24.7.  Hemoglobin 7.8.  MCV 87.6 on 11/11/2019.             Etiology likely secondary to acute blood loss plus anemia of chronic renal disease.             Given baseline renal insufficiency, normal serum protein and low albumen, r/o monoclonal gammopathy.   SPEP reveals a 0.4 gm/dL + an additional 0.2 gm/dL biclonal IgA protein with kappa specificity.   Unclear significance (MGUS or MM).   Will address this weekend to see if patient would consider a bone marrow biopsy.   Patient has both lytic and blastic lesions on imaging.    Lytic lesions could represent myeloma.    Prostate cancer predominantly blastic.             Patient is s/p 1 unit of PRBCs.             Maintain hemoglobin 7-8.             Transfuse PRBC as needed.             Ferritin 1454 (acute phase reactant) with iron saturation 20% and TIBC 1389.  B12 and folate normal. 4.   Disposition  Family meeting planned for 11/12/2019.   Lequita Asal, MD  11/11/2019, 6:43 AM

## 2019-11-11 NOTE — TOC Progression Note (Signed)
Transition of Care [facility]) - Progression Note    Patient Details  Name: [name] MRN: [id number] Date of Birth: 04-06-1932  [facility]) CM/SW Contact  Beverly Sessions, RN Phone Number: 11/11/2019, 3:00 PM  Clinical Narrative:     CBI clamped.   PT eval pending.   Per oncology note family meeting scheduled for 1/23        Expected Discharge Plan and Services                                                 Social Determinants of Health (SDOH) Interventions    Readmission Risk Interventions No flowsheet data found.

## 2019-11-11 NOTE — Progress Notes (Signed)
PROGRESS NOTE  [name] HYQ:[id number] DOB: 01-Jan-1932 DOA: 11/04/2019 PCP: [name], PA-C  HPI/Recap of past 24 hours: Patient is an 86 year old male with past medical history of CAD status post stent, chronic diastolic heart failure, sleep apnea, diabetes mellitus, stage III chronic kidney disease and recent DVT diagnosed 3 months ago placed on Eliquis who presented with hematuria.  Per his assisted living facility, patient had been reportedly having hematuria for the past 5 days.  In the emergency room, patient was noted to have a white count of 11.9 and a hemoglobin of 8.3, down from 9.52 weeks prior.  He was noted to have gross hematuria with clots on bladder scan noted a third of a liter.  Patient was started on continuous bladder irrigation and urology was consulted.  Urology was able to irrigate the bladder with about a liter of normal saline and then the patient was set up for continuous bladder irrigation.  He was also transfused 1 unit packed red blood cells.  His Eliquis was held.  Following transfusion, patient's hemoglobin improved to 8.6, and by this afternoon, down to 7.9.  In discussion with urology, they suspect that he has metastatic prostate cancer.  CT scan of abdomen pelvis notes multiple areas of metastases.  PSA came back on 1/18 greater than 2000.  Oncology consulted & and have ordered CT scan of chest which is pending as well as bone scan which shows diffuse bone metastases.  Vascular surgery placed IVC filter on 1/18.  Orthopedics consulted for possible vertebroplasty at L1 site for pathologic fracture.   Subjective/interval history: Pt reported feeling better.  Was drinking milk and attempting to eat solid foods.  Admitted to dysuria with the bladder irrigation.  No fever, dyspnea, chest pain, abdominal pain, N/V, increased swelling.  Having lots BM's, but not diarrhea, per pt.    Assessment/Plan: Principal Problem:   Hematuria and acute blood loss anemia  in patient with prostate cancer  --Anticoagulation discontinued.  Being followed by urology.  Hemoglobin remaining stable for now. --continue bladder irrigation, per urology, still outputting pink urine.    CAD (coronary artery disease) stable.    Insulin dependent type 2 diabetes mellitus ([place]): On sliding scale.    Essential hypertension: Blood pressures have been stable to slightly on the high end.  Continue home medications.    CKD (chronic kidney disease) stage 3a, GFR 30-59 ml/min: Stable.,  At baseline.    Chronic diastolic CHF (congestive heart failure) [facility]): Monitor closely.  Given blood and IVC fluid, likely could become hypervolemic easily.  Diurese as appropriate.    Elevated LFTs  Metastatic prostate cancer with widespread metastases to bone as well as liver:  --PSA greater than 2000.  Oncology discussing treatment options.   The patient's wife passed away in the last year from pancreatic cancer.  Discussed with daughter.  Decided to go forward with treatment --initiation of degarelix  Pathologic L1 fracture minimally symptomatic --Ortho consulted, and rec observation    OSA on CPAP: Nightly CPAP.   Dyslipidemia: Continue statin.    Morbid obesity ([place]): Meets criteria for BMI greater than 35+ diabetes and hypertension and heart failure.    DVT (deep venous thrombosis) ([place]): Diagnosed 3 months ago.  Can no longer be on anticoagulation.  Vascular surgery placed IVC filter on 1/18  Normocytic anemia --Ferritin 1454 (acute phase reactant) with iron saturation 20% and TIBC 1389.  B12 and folate normal. --s/p 1u pRBC --Maintain hemoglobin 7-8.  Code Status: Full code --  palliative care following  Family Communication: not today Disposition Plan: Need PT/OT after bladder irrigation is done, disposition undetermined at this time.   Consultants:  Urology  Vascular surgery  Oncology  Orthopedics  Procedures:  Bladder irrigation started 1/15  IVC filter  placement 1/18  Antimicrobials:  None  DVT prophylaxis: SCDs   Objective: Vitals:   11/11/19 1744 11/11/19 1925  BP: (!) 164/71 (!) 132/56  Pulse: 72 83  Resp:  17  Temp:  98.2 F (36.8 C)  SpO2:  97%    Intake/Output Summary (Last 24 hours) at 11/11/2019 1929 Last data filed at 11/11/2019 1756 Gross per 24 hour  Intake 15200 ml  Output 11150 ml  Net 4050 ml   Filed Weights   11/04/19 1241 11/05/19 0135  Weight: 123.4 kg 115.4 kg   Body mass index is 35.48 kg/m.  Exam: Constitutional: NAD, AAOx3, brighter today HEENT: conjunctivae and lids normal, EOMI CV: RRR no M,R,G. Distal pulses +2.  No cyanosis.   RESP: CTA B/L, normal respiratory effort  GI: +BS, NTND Extremities: No effusions, edema, or tenderness in BLE SKIN: warm.  Skin over both lower legs have severe venous stasis changes, dry, leathery, at high risk of cracking, moisturized today Neuro: II - XII grossly intact.  Sensation intact Psych: Normal mood and affect.  Appropriate judgement and reason Bladder irrigation still outputting pink urine.   Data Reviewed: CBC: Recent Labs  Lab 11/06/19 0514 11/07/19 0645 11/08/19 0520 11/10/19 0506 11/11/19 0535  WBC 12.1* 11.2* 10.1 10.5 10.5  HGB 7.7* 7.7* 7.8* 7.9* 7.8*  HCT 24.0* 24.5* 24.8* 25.3* 24.7*  MCV 86.6 86.9 87.9 87.2 87.6  PLT 247 272 285 334 607   Basic Metabolic Panel: Recent Labs  Lab 11/05/19 0623 11/06/19 0514 11/07/19 0645 11/10/19 0506 11/11/19 0535  NA 135 137 138 138 140  K 4.4 4.6 4.4 4.0 4.4  CL 101 104 105 104 107  CO2 24 24 24 25 23   GLUCOSE 154* 128* 158* 170* 160*  BUN 29* 27* 23 14 16   CREATININE 1.61* 1.62* 1.45* 1.44* 1.54*  CALCIUM 9.4 9.2 9.3 9.5 9.3  MG  --   --   --  1.8 1.9   GFR: Estimated Creatinine Clearance: 45.3 mL/min (A) (by C-G formula based on SCr of 1.54 mg/dL (H)). Liver Function Tests: Recent Labs  Lab 11/07/19 0645  AST 23  ALT 31  ALKPHOS 111  BILITOT 1.0  PROT 6.2*  ALBUMIN 2.7*    No results for input(s): LIPASE, AMYLASE in the last 168 hours. No results for input(s): AMMONIA in the last 168 hours. Coagulation Profile: No results for input(s): INR, PROTIME in the last 168 hours. Cardiac Enzymes: No results for input(s): CKTOTAL, CKMB, CKMBINDEX, TROPONINI in the last 168 hours. BNP (last 3 results) No results for input(s): PROBNP in the last 8760 hours. HbA1C: No results for input(s): HGBA1C in the last 72 hours. CBG: Recent Labs  Lab 11/10/19 1650 11/10/19 2123 11/11/19 0733 11/11/19 1311 11/11/19 1722  GLUCAP 188* 150* 136* 137* 152*   Lipid Profile: No results for input(s): CHOL, HDL, LDLCALC, TRIG, CHOLHDL, LDLDIRECT in the last 72 hours. Thyroid Function Tests: No results for input(s): TSH, T4TOTAL, FREET4, T3FREE, THYROIDAB in the last 72 hours. Anemia Panel: No results for input(s): VITAMINB12, FOLATE, FERRITIN, TIBC, IRON, RETICCTPCT in the last 72 hours. Urine analysis:    Component Value Date/Time   COLORURINE RED (A) 11/06/2019 1039   APPEARANCEUR HAZY (A) 11/06/2019 1039  LABSPEC 1.005 11/06/2019 1039   PHURINE  11/06/2019 1039    TEST NOT REPORTED DUE TO COLOR INTERFERENCE OF URINE PIGMENT   GLUCOSEU (A) 11/06/2019 1039    TEST NOT REPORTED DUE TO COLOR INTERFERENCE OF URINE PIGMENT   HGBUR (A) 11/06/2019 1039    TEST NOT REPORTED DUE TO COLOR INTERFERENCE OF URINE PIGMENT   BILIRUBINUR (A) 11/06/2019 1039    TEST NOT REPORTED DUE TO COLOR INTERFERENCE OF URINE PIGMENT   KETONESUR (A) 11/06/2019 1039    TEST NOT REPORTED DUE TO COLOR INTERFERENCE OF URINE PIGMENT   PROTEINUR (A) 11/06/2019 1039    TEST NOT REPORTED DUE TO COLOR INTERFERENCE OF URINE PIGMENT   NITRITE (A) 11/06/2019 1039    TEST NOT REPORTED DUE TO COLOR INTERFERENCE OF URINE PIGMENT   LEUKOCYTESUR (A) 11/06/2019 1039    TEST NOT REPORTED DUE TO COLOR INTERFERENCE OF URINE PIGMENT   Sepsis Labs: @LABRCNTIP (procalcitonin:4,lacticidven:4)  ) Recent Results  (from the past 240 hour(s))  Urine culture     Status: Abnormal   Collection Time: 11/04/19  3:52 PM   Specimen: Urine, Random  Result Value Ref Range Status   Specimen Description   Final    URINE, RANDOM Performed at [facility], [address], [place], [place] [address]    Special Requests   Final    NONE Performed at [facility], [address], [place], [place] [address]    Culture (A)  Final    <10,000 COLONIES/mL INSIGNIFICANT GROWTH Performed at [facility], [place] [address] [place], [place] [address]    Report Status 11/05/2019 FINAL  Final  SARS CORONAVIRUS 2 (TAT 6-24 HRS) Nasopharyngeal Nasopharyngeal Swab     Status: None   Collection Time: 11/04/19  8:06 PM   Specimen: Nasopharyngeal Swab  Result Value Ref Range Status   SARS Coronavirus 2 NEGATIVE NEGATIVE Final    Comment: (NOTE) SARS-CoV-2 target nucleic acids are NOT DETECTED. The SARS-CoV-2 RNA is generally detectable in upper and lower respiratory specimens during the acute phase of infection. Negative results do not preclude SARS-CoV-2 infection, do not rule out co-infections with other pathogens, and should not be used as the sole basis for treatment or other patient management decisions. Negative results must be combined with clinical observations, patient history, and epidemiological information. The expected result is Negative. Fact Sheet for Patients: SugarRoll.be Fact Sheet for Healthcare Providers: https://www.woods-mathews.com/ This test is not yet approved or cleared by the [place] FDA and  has been authorized for detection and/or diagnosis of SARS-CoV-2 by FDA under an Emergency Use Authorization (EUA). This EUA will remain  in effect (meaning this test can be used) for the duration of the COVID-19 declaration under Section 56 4(b)(1) of the Act, 21 U.S.C. section 360bbb-3(b)(1), unless the authorization is terminated  or revoked sooner. Performed at [facility], [place] [address] [place], [place] [address]   Urine culture     Status: None   Collection Time: 11/06/19 10:39 AM   Specimen: Urine, Random  Result Value Ref Range Status   Specimen Description   Final    URINE, RANDOM Performed at [facility], [address], [place], [place] [address]    Special Requests   Final    NONE Performed at [facility], [address] [place], [place] [address]    Culture   Final    NO GROWTH Performed at [facility], [place],  [place] [address]    Report Status 11/07/2019 FINAL  Final      Studies: No results found.  Scheduled Meds: . atorvastatin  80 mg Oral Daily  . carvedilol  25 mg Oral BID WC  . Chlorhexidine Gluconate Cloth  6 each Topical Daily  . DULoxetine  60 mg Oral Daily  . feeding supplement (ENSURE ENLIVE)  237 mL Oral BID BM  . finasteride  5 mg Oral Daily  . insulin aspart  0-15 Units Subcutaneous TID WC    Continuous Infusions: . sodium chloride irrigation Stopped (11/05/19 0035)     LOS: 7 days     Enzo Bi, MD [facility]   11/11/2019, 7:29 PM

## 2019-11-11 NOTE — Telephone Encounter (Addendum)
Oral Oncology Pharmacist Encounter  Received new prescription for Zytiga (abiraterone) for the treatment of metastatic castration-sensitive prostate cancer in conjunction with prednisone 5 mg PO once daily and Firmagon (degarelix), planned duration until disease progression or unacceptable toxicity.  Prescription dose and frequency assessed. Appropriate for treatment initiation.   BMP and CBC from 11/11/19 assessed:  Scr 1.54 (CrCl ~37 mL/min) - patient has PMH significant for CKD, no renal dose adjustments necessary for Zytiga  Glucose 160 - patient has hxo T2DM, will continue to monitor glucose throughout therapy on abiraterone and prednisone - OK for treatment initiation  Current medication list in Epic reviewed, DDIs with Zytiga identified:  Category C DDI with Zytiga and Carvedilol - Zytiga may increase the serum concentrations of carvedilol. Will counsel patient on monitoring for signs of hypotension, bradycardia when initiating therapy.   Prescription has been e-scribed to the [facility] for benefits analysis and approval.  Oral Oncology Clinic will continue to follow for insurance authorization, copayment issues, initial counseling and start date.  Leron Croak, PharmD, BCPS PGY2 Hematology/Oncology Pharmacy Resident 11/11/2019 10:00 AM Oral Oncology Clinic [phone]

## 2019-11-11 NOTE — Progress Notes (Signed)
PT Cancellation Note  Patient Details Name: [name] MRN: [id number] DOB: [birth year]   Cancelled Treatment:    Reason Eval/Treat Not Completed: Medical issues which prohibited therapy.  Per discussion with pt's primary RN, pt still on CBI (currently clamped); d/t pt still on CBI, pt currently contraindicated for mobility.  D/t pt still with CBI and not appropriate for mobility, will discontinue PT order at this time (nurse notified).  Please re-consult PT when CBI is discontinued and pt is medically appropriate for physical therapy.  Leitha Bleak, PT 11/11/19, 3:30 PM

## 2019-11-11 NOTE — Telephone Encounter (Signed)
Oral Oncology Patient Advocate Encounter  After completing a benefits investigation, prior authorization for Zytiga is not required at this time through [facility].  Patient's copay is $125.00.  I have obtained a grant through [employer] to reduce patients out of pocket to $0.00.  I will document that information in another encounter.  [place] [facility] [place] Phone [phone] Fax [phone] 11/11/2019 3:40 PM

## 2019-11-12 DIAGNOSIS — D472 Monoclonal gammopathy: Secondary | ICD-10-CM

## 2019-11-12 LAB — GLUCOSE, CAPILLARY
Glucose-Capillary: 167 mg/dL — ABNORMAL HIGH (ref 70–99)
Glucose-Capillary: 150 mg/dL — ABNORMAL HIGH (ref 70–99)
Glucose-Capillary: 254 mg/dL — ABNORMAL HIGH (ref 70–99)
Glucose-Capillary: 251 mg/dL — ABNORMAL HIGH (ref 70–99)

## 2019-11-12 LAB — BASIC METABOLIC PANEL
Anion gap: 10 (ref 5–15)
BUN: 20 mg/dL (ref 8–23)
CO2: 25 mmol/L (ref 22–32)
Calcium: 9.3 mg/dL (ref 8.9–10.3)
Chloride: 105 mmol/L (ref 98–111)
Creatinine, Ser: 1.63 mg/dL — ABNORMAL HIGH (ref 0.61–1.24)
GFR calc Af Amer: 44 mL/min — ABNORMAL LOW (ref >60)
GFR calc non Af Amer: 38 mL/min — ABNORMAL LOW (ref >60)
Glucose, Bld: 183 mg/dL — ABNORMAL HIGH (ref 70–99)
Potassium: 4.4 mmol/L (ref 3.5–5.1)
Sodium: 140 mmol/L (ref 135–145)

## 2019-11-12 LAB — CBC
HCT: 25.7 % — ABNORMAL LOW (ref 39.0–52.0)
Hemoglobin: 7.9 g/dL — ABNORMAL LOW (ref 13.0–17.0)
MCH: 27.1 pg (ref 26.0–34.0)
MCHC: 30.7 g/dL (ref 30.0–36.0)
MCV: 88.3 fL (ref 80.0–100.0)
Platelets: 322 10*3/uL (ref 150–400)
RBC: 2.91 MIL/uL — ABNORMAL LOW (ref 4.22–5.81)
RDW: 15.2 % (ref 11.5–15.5)
WBC: 13.1 10*3/uL — ABNORMAL HIGH (ref 4.0–10.5)
nRBC: 0 % (ref 0.0–0.2)

## 2019-11-12 LAB — MAGNESIUM: Magnesium: 1.9 mg/dL (ref 1.7–2.4)

## 2019-11-12 NOTE — Progress Notes (Addendum)
[facility] Hematology/Oncology Progress Note  Date of admission: 11/04/2019  Hospital day:  11/12/2019  Chief Complaint: [name] is a 86 y.o. male who was admitted who with hematuria.  Subjective:  He denies any complaint.  Urine remains slightly pink with CBI.  Social History:  The patient is accompanied by his daughter and 7 family member on a conference call.  Allergies:  Allergies  Allergen Reactions  . Metformin And Related Diarrhea    Intolerance due to naturally loose bowels    Scheduled Medications: . atorvastatin  80 mg Oral Daily  . carvedilol  25 mg Oral BID WC  . Chlorhexidine Gluconate Cloth  6 each Topical Daily  . DULoxetine  60 mg Oral Daily  . feeding supplement (ENSURE ENLIVE)  237 mL Oral BID BM  . finasteride  5 mg Oral Daily  . insulin aspart  0-15 Units Subcutaneous TID WC    Review of Systems: GENERAL:  General fatigue.  Weight loss.  No fevers or sweats. PERFORMANCE STATUS (ECOG):  2-3 HEENT:  No visual changes, runny nose, sore throat, mouth sores or tenderness. Lungs: No shortness of breath or cough.  No hemoptysis. Cardiac:  No chest pain, palpitations, orthopnea, or PND. GI:  Poor appetite.  Drinking Ensure.  No nausea, vomiting, diarrhea, constipation, melena or hematochezia. GU:  Hematuria, improving.  CBI continues.  No urgency, frequency, dysuria. Musculoskeletal:  No back pain.  Chronic joint pain (shoulder, knees).  No muscle tenderness. Extremities:  No pain or swelling. Skin:  No rashes or skin changes. Neuro:  No headache, numbness or weakness, balance or coordination issues. Endocrine:  Diabetes.  No thyroid issues, hot flashes or night sweats. Psych:  No mood changes, depression or anxiety. Pain:  No focal pain. Review of systems:  All other systems reviewed and found to be negative.  Physical Exam: Blood pressure 138/70, pulse 76, temperature 98.5 F (36.9 C), temperature source Oral, resp. rate 14,  height 5' 11"  (1.803 m), weight 254 lb 6.6 oz (115.4 kg), SpO2 99 %.  GENERAL:  Well developed, well nourished, elderly gentleman sitting up slightly  on the medical unit drinking an Ensure in no acute distress. MENTAL STATUS:  Alert and oriented to person, place and time. HEAD: Short graying hair and beard.  Male pattern baldness.  Normocephalic, atraumatic, face symmetric, no Cushingoid features. EYES:  Brown eyes.  Pupils equal round and reactive to light and accomodation.  No conjunctivitis or scleral icterus. ENT:  Oropharynx clear without lesion.  Tongue normal. Mucous membranes moist.  RESPIRATORY:  Slight decreased breath sounds right base anteriorly.  No rales, wheezes or rhonchi. CARDIOVASCULAR:  Regular rate and rhythm without murmur, rub or gallop. ABDOMEN:  Soft, non-tender, with active bowel sounds, and no appreciable hepatosplenomegaly.  No masses. GU:  Catheter in place.  Urine light pink. SKIN:  No rashes, ulcers or lesions. EXTREMITIES: No edema, no skin discoloration or tenderness.  No palpable cords. NEUROLOGICAL: Unremarkable. PSYCH:  Appropriate.   Results for orders placed or performed during the hospital encounter of 11/04/19 (from the past 48 hour(s))  Glucose, capillary     Status: Abnormal   Collection Time: 11/10/19 11:50 AM  Result Value Ref Range   Glucose-Capillary 139 (H) 70 - 99 mg/dL  Glucose, capillary     Status: Abnormal   Collection Time: 11/10/19  4:50 PM  Result Value Ref Range   Glucose-Capillary 188 (H) 70 - 99 mg/dL  Glucose, capillary     Status:  Abnormal   Collection Time: 11/10/19  9:23 PM  Result Value Ref Range   Glucose-Capillary 150 (H) 70 - 99 mg/dL  Basic metabolic panel     Status: Abnormal   Collection Time: 11/11/19  5:35 AM  Result Value Ref Range   Sodium 140 135 - 145 mmol/L   Potassium 4.4 3.5 - 5.1 mmol/L   Chloride 107 98 - 111 mmol/L   CO2 23 22 - 32 mmol/L   Glucose, Bld 160 (H) 70 - 99 mg/dL   BUN 16 8 - 23 mg/dL    Creatinine, Ser 1.54 (H) 0.61 - 1.24 mg/dL   Calcium 9.3 8.9 - 10.3 mg/dL   GFR calc non Af Amer 41 (L) >60 mL/min   GFR calc Af Amer 47 (L) >60 mL/min   Anion gap 10 5 - 15    Comment: Performed at [facility], [address] [place], [place] [address]  CBC     Status: Abnormal   Collection Time: 11/11/19  5:35 AM  Result Value Ref Range   WBC 10.5 4.0 - 10.5 K/uL   RBC 2.82 (L) 4.22 - 5.81 MIL/uL   Hemoglobin 7.8 (L) 13.0 - 17.0 g/dL   HCT 24.7 (L) 39.0 - 52.0 %   MCV 87.6 80.0 - 100.0 fL   MCH 27.7 26.0 - 34.0 pg   MCHC 31.6 30.0 - 36.0 g/dL   RDW 15.3 11.5 - 15.5 %   Platelets 304 150 - 400 K/uL   nRBC 0.0 0.0 - 0.2 %    Comment: Performed at [facility], [address] [place], [place] [address]  Magnesium     Status: None   Collection Time: 11/11/19  5:35 AM  Result Value Ref Range   Magnesium 1.9 1.7 - 2.4 mg/dL    Comment: Performed at [facility], [address] [place], [place] [address]  Glucose, capillary     Status: Abnormal   Collection Time: 11/11/19  7:33 AM  Result Value Ref Range   Glucose-Capillary 136 (H) 70 - 99 mg/dL  Glucose, capillary     Status: Abnormal   Collection Time: 11/11/19  1:11 PM  Result Value Ref Range   Glucose-Capillary 137 (H) 70 - 99 mg/dL  Glucose, capillary     Status: Abnormal   Collection Time: 11/11/19  5:22 PM  Result Value Ref Range   Glucose-Capillary 152 (H) 70 - 99 mg/dL  Glucose, capillary     Status: Abnormal   Collection Time: 11/11/19  9:01 PM  Result Value Ref Range   Glucose-Capillary 239 (H) 70 - 99 mg/dL  Basic metabolic panel     Status: Abnormal   Collection Time: 11/12/19  4:34 AM  Result Value Ref Range   Sodium 140 135 - 145 mmol/L   Potassium 4.4 3.5 - 5.1 mmol/L   Chloride 105 98 - 111 mmol/L   CO2 25 22 - 32 mmol/L   Glucose, Bld 183 (H) 70 - 99 mg/dL   BUN 20 8 - 23 mg/dL   Creatinine, Ser 1.63 (H) 0.61 - 1.24 mg/dL   Calcium 9.3 8.9 - 10.3 mg/dL   GFR calc non  Af Amer 38 (L) >60 mL/min   GFR calc Af Amer 44 (L) >60 mL/min   Anion gap 10 5 - 15    Comment: Performed at [facility], [address], [place], [place] [address]  CBC     Status: Abnormal   Collection Time: 11/12/19  4:34 AM  Result Value Ref Range   WBC 13.1 (H) 4.0 - 10.5 K/uL   RBC 2.91 (L) 4.22 - 5.81 MIL/uL   Hemoglobin 7.9 (L) 13.0 - 17.0 g/dL   HCT 25.7 (L) 39.0 - 52.0 %   MCV 88.3 80.0 - 100.0 fL   MCH 27.1 26.0 - 34.0 pg   MCHC 30.7 30.0 - 36.0 g/dL   RDW 15.2 11.5 - 15.5 %   Platelets 322 150 - 400 K/uL   nRBC 0.0 0.0 - 0.2 %    Comment: Performed at [facility], [address], [place], [place] [address]  Magnesium     Status: None   Collection Time: 11/12/19  4:34 AM  Result Value Ref Range   Magnesium 1.9 1.7 - 2.4 mg/dL    Comment: Performed at [facility], [address] [place], [place] [address]  Glucose, capillary     Status: Abnormal   Collection Time: 11/12/19  8:05 AM  Result Value Ref Range   Glucose-Capillary 167 (H) 70 - 99 mg/dL   Comment 1 Notify RN    No results found.  Assessment:  [name] is a 86 y.o. male with metastatic prostate cancer.  He presented with gross hematuria.  PSA  was 2,241.0 on 11/06/2019.   Renal stone CT on 11/05/2019 revealed confluent nodular masses adjacent to the prostate and distal sigmoid colon with left pelvic, perirectal, retroperitoneal, and mesenteric adenopathy as well as probable peritoneal disease most marked in the right upper quadrant extending about the right diaphragmatic leaflet.  There was pelvic and suspected vertebral osseous metastatic disease with L1 compression fracture, likely pathologic.  There was a possible 2 cm right lower lobe lung nodule.  There was left hydronephrosis and proximal ureterectasis secondary to pelvic process (prior renal ultrasound on 10/16/2019 revealed no hydronephrosis).  Chest CT on 11/08/2019 revealed right pleural effusion with pleural  metastasis (2.7 x 2.1 cm nodularity along anterior right hemidiaphragm).  Osseous metastasis, including a dominant lesion involving the right-side of the L2 vertebral body.   Bone scan on 11/08/2019 revealed widespread bony metastasis (right occiput, clavicles, left upper extremity, sternum, ribs bilaterally, throughout the cervical, thoracic, lumbar spine, bony pelvis, proximal femurs, right tibia).  He received degarelix on 11/11/2019.  He has a monoclonal gammopathy.  SPEP on 10/16/2019 revealed a 0.4 gm/dL + an additional 0.2 gm/dL biclonal IgA protein with kappa specificity.  The patient has a history of DVT approximately 3 months ago.  IVC filter was placed on 11/07/2019.  Symptomatically, his appetite is poor.  He denies any bone pain.  Urine is slightly pink.  Plan:   1.   Metastatic prostatic prostate cancer             Additional imaging from recent chest CT and bone scan reviewed with patient.             Patient has extensive pelvic, perirectal, retroperitoneal and mesenteric adenopathy.             Patient has extensive osseous metastasis.             Suspect pleural based nodularity is prostate cancer.             No currrent intervention for  L1 pathologic compression as patient asymptomatic.             Patient received degarelix yesterday.  Abiraterone should arrive on Monday, 01/28/20121.  Discuss patient's thoughts about a biopsy for NGS and future  treatment options.   Patient currently declines biopsy.  Multiple questions fielded by family members and answered to their satisfaction. 2.   Hematuria             Aspirin, Plavix, and Eliquis remain held.             Patient s/p IVC filter placement on 11/07/2019.             CBC is stable. 3.   Normocytic anemia             Hematocrit 24.8.  Hemoglobin 7.8.  MCV 87.9 on 11/08/2019.              Etiology likely secondary to acute blood loss, anemia of chronic renal disease, and possible monoclonal gammopathy.                          Patient is s/p 1 unit of PRBCs.             Maintain hemoglobin 7-8.             Transfuse PRBC as needed.             Ferritin 1454 (acute phase reactant) with iron saturation 20% and TIBC 1389.  B12 and folate normal. 4.   Monoclonal gammopathy  SPEP on 10/16/2019 revealed a 0.4 gm/dL + an additional 0.2 gm/dL biclonal IgA protein with kappa specificity.  Reviewed difference between a monoclonal gammopathy or unknown significance (MGUS) and multiple myeloma (MM).   Discuss CRAB criteria- hypercalcemia, renal disease, anemia, bone lesions.    Calcium is normal.  Anemia is likely multi-factorial.  Creatinine 1.45-1.52.      Some bone lesions are blastic (typical for prostate cancer) and some lytic (more typical for myeloma).  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  Discuss consideration of a bone marrow biopsy.   Procedure discussed in detail.  Risks and benefits reviewed.   He will discuss further with his family before a decision is made. 5.   Disposition  Patient remains an inpatient.  A total of (35-40) minutes of face-to-face time was spent with the patient and his family with greater than 50% of that time in counseling and care-coordination.    Lequita Asal, MD  11/12/2019, 11:45 AM

## 2019-11-12 NOTE — Progress Notes (Signed)
Urology Inpatient Progress Note  Subjective: [name] is a 86 y.o. male admitted on 11/04/2019 with gross hematuria x5 days on multiple anticoagulants, now holding anticoagulation and s/p IVC filter placement on CBI.  PSA >2000 with CT findings suggestive of metastatic prostate cancer.  Received degarelix yesterday will be starting oral medication Monday.  Seen by [name] this morning.  Daughter at bedside this afternoon.  CBI is off.  Urine in bag is only blood-tinged but there is some darker dependent pooling of blood in the catheter tubing.  Anti-infectives: Anti-infectives (From admission, onward)   Start     Dose/Rate Route Frequency Ordered Stop   11/08/19 0000  ceFAZolin (ANCEF) IVPB 1 g/50 mL premix    Note to Pharmacy: To be given in specials   1 g 100 mL/hr over 30 Minutes Intravenous  Once 11/07/19 1428 11/07/19 1723      Current Facility-Administered Medications  Medication Dose Route Frequency Provider Last Rate Last Admin  . atorvastatin (LIPITOR) tablet 80 mg  80 mg Oral Daily Algernon Huxley, MD   80 mg at 11/11/19 1744  . carvedilol (COREG) tablet 25 mg  25 mg Oral BID WC Algernon Huxley, MD   25 mg at 11/12/19 0854  . Chlorhexidine Gluconate Cloth 2 % PADS 6 each  6 each Topical Daily Algernon Huxley, MD   6 each at 11/11/19 1000  . DULoxetine (CYMBALTA) DR capsule 60 mg  60 mg Oral Daily Algernon Huxley, MD   60 mg at 11/12/19 0853  . feeding supplement (ENSURE ENLIVE) (ENSURE ENLIVE) liquid 237 mL  237 mL Oral BID BM Enzo Bi, MD   237 mL at 11/12/19 1236  . finasteride (PROSCAR) tablet 5 mg  5 mg Oral Daily Algernon Huxley, MD   5 mg at 11/12/19 0854  . insulin aspart (novoLOG) injection 0-15 Units  0-15 Units Subcutaneous TID WC Algernon Huxley, MD   2 Units at 11/12/19 1231  . ondansetron (ZOFRAN) injection 4 mg  4 mg Intravenous Q6H PRN Algernon Huxley, MD   4 mg at 11/09/19 1603  . opium-belladonna (B&O SUPPRETTES) 16.2-60 MG suppository 1 suppository  1 suppository Rectal  Q8H PRN Algernon Huxley, MD      . oxyCODONE-acetaminophen (PERCOCET/ROXICET) 5-325 MG per tablet 1 tablet  1 tablet Oral Q8H PRN Basilio Cairo, NP   1 tablet at 11/10/19 1540  . sodium chloride irrigation 0.9 % 3,000 mL  3,000 mL Irrigation Continuous Algernon Huxley, MD   Stopped at 11/05/19 0035    Objective: Vital signs in last 24 hours: Temp:  [98.2 F (36.8 C)-98.5 F (36.9 C)] 98.2 F (36.8 C) (01/23 1157) Pulse Rate:  [68-83] 73 (01/23 1157) Resp:  [14-20] 20 (01/23 1157) BP: (132-164)/(56-71) 144/67 (01/23 1157) SpO2:  [97 %-99 %] 99 % (01/23 1157)  Intake/Output from previous day: 01/22 0701 - 01/23 0700 In: 12500  Out: 13050 [Urine:13050] Intake/Output this shift: Total I/O In: 600 [Other:600] Out: 675 [Urine:675]  Physical Exam Vitals and nursing note reviewed.  Constitutional:      General: He is not in acute distress.    Appearance: He is not ill-appearing, toxic-appearing or diaphoretic.  HENT:     Head: Normocephalic and atraumatic.  Pulmonary:     Effort: Pulmonary effort is normal. No respiratory distress.  Genitourinary:    Comments: Urine in bag pink-tinged with some maroon-colored urine which appears to be related to urine pooling in the tubing,.  Once cleared from tubing, urine remained very light pink without clots. Skin:    General: Skin is warm and dry.    Lab Results:  Recent Labs    11/11/19 0535 11/12/19 0434  WBC 10.5 13.1*  HGB 7.8* 7.9*  HCT 24.7* 25.7*  PLT 304 322   BMET Recent Labs    11/11/19 0535 11/12/19 0434  NA 140 140  K 4.4 4.4  CL 107 105  CO2 23 25  GLUCOSE 160* 183*  BUN 16 20  CREATININE 1.54* 1.63*  CALCIUM 9.3 9.3   Assessment & Plan: 86 year old male admitted with gross hematuria on multiple anticoagulants, now s/p IVC filter placement and holding anticoagulation on CBI.  Appreciate oncology and palliative care input.  Patient has elected to receive the GnRH antagonists which should start working quickly to  reduce prostatic volume and hypervascularity and reduce the degree of hematuria.  CBI is off today and urine is appropriately clear.  Briefly turned back on for moment to clear the tubing, urine clear briskly and subsequent urine in tubing only pink-tinged which is reassuring.  No clots.  We will maintain Foley catheter for 1 to 2 days with CBI off.  Discussed the option of a voiding trial Monday morning to see if he is able to void independently depending on color of urine.  Hemoglobin is stable which is also reassuring.  No contraindication for PT/OT evaluation at this point will help facilitate with discharge planning.  Hollice Espy, MD 11/12/2019

## 2019-11-12 NOTE — Progress Notes (Signed)
PROGRESS NOTE  [name] GMW:[id number] DOB: 1932-08-29 DOA: 11/04/2019 PCP: [name], PA-C  HPI/Recap of past 24 hours: Patient is an 86 year old male with past medical history of CAD status post stent, chronic diastolic heart failure, sleep apnea, diabetes mellitus, stage III chronic kidney disease and recent DVT diagnosed 3 months ago placed on Eliquis who presented with hematuria.  Per his assisted living facility, patient had been reportedly having hematuria for the past 5 days.  In the emergency room, patient was noted to have a white count of 11.9 and a hemoglobin of 8.3, down from 9.52 weeks prior.  He was noted to have gross hematuria with clots on bladder scan noted a third of a liter.  Patient was started on continuous bladder irrigation and urology was consulted.  Urology was able to irrigate the bladder with about a liter of normal saline and then the patient was set up for continuous bladder irrigation.  He was also transfused 1 unit packed red blood cells.  His Eliquis was held.  Following transfusion, patient's hemoglobin improved to 8.6, and by this afternoon, down to 7.9.  In discussion with urology, they suspect that he has metastatic prostate cancer.  CT scan of abdomen pelvis notes multiple areas of metastases.  PSA came back on 1/18 greater than 2000.  Oncology consulted & and have ordered CT scan of chest which is pending as well as bone scan which shows diffuse bone metastases.  Vascular surgery placed IVC filter on 1/18.  Orthopedics consulted for possible vertebroplasty at L1 site for pathologic fracture.   Subjective/interval history: Still not eating solid foods, poor appetite, but drank some Ensure and milk.  Feeling tired and weak.  No fever, dyspnea, chest pain, N/V/D, increased swelling.  Assessment/Plan: Principal Problem:   Hematuria and acute blood loss anemia in patient with prostate cancer  --Anticoagulation discontinued.  Being followed by  urology.  Hemoglobin remaining stable for now. --continue bladder irrigation, per urology, still outputting pink urine.    CAD (coronary artery disease) stable.    Insulin dependent type 2 diabetes mellitus ([place]): On sliding scale.    Essential hypertension: Blood pressures have been stable to slightly on the high end.  Continue home medications.    CKD (chronic kidney disease) stage 3a, GFR 30-59 ml/min: Stable.,  At baseline.    Chronic diastolic CHF (congestive heart failure) [facility]): Monitor closely.  Given blood and IVC fluid, likely could become hypervolemic easily.  Diurese as appropriate.    Elevated LFTs  Metastatic prostate cancer with widespread metastases to bone as well as liver:  --PSA greater than 2000.  Oncology discussing treatment options.   The patient's wife passed away in the last year from pancreatic cancer.  Discussed with daughter.  Decided to go forward with treatment --degarelix per Oncology  Pathologic L1 fracture minimally symptomatic --Ortho consulted, and rec observation    OSA on CPAP: Nightly CPAP.   Dyslipidemia: Continue statin.    Morbid obesity ([place]): Meets criteria for BMI greater than 35+ diabetes and hypertension and heart failure.    DVT (deep venous thrombosis) ([place]): Diagnosed 3 months ago.  Can no longer be on anticoagulation.  Vascular surgery placed IVC filter on 1/18  Normocytic anemia --Ferritin 1454 (acute phase reactant) with iron saturation 20% and TIBC 1389.  B12 and folate normal. --s/p 1u pRBC --Maintain hemoglobin 7-8.  Code Status: Full code --palliative care following  Family Communication: not today Disposition Plan: Need PT/OT, disposition undetermined at this time.  Consultants:  Urology  Vascular surgery  Oncology  Orthopedics  Procedures:  Bladder irrigation started 1/15  IVC filter placement 1/18  Antimicrobials:  None  DVT prophylaxis: SCDs   Objective: Vitals:   11/12/19 1157 11/12/19  1730  BP: (!) 144/67 (!) 145/71  Pulse: 73 70  Resp: 20   Temp: 98.2 F (36.8 C)   SpO2: 99%     Intake/Output Summary (Last 24 hours) at 11/12/2019 1929 Last data filed at 11/12/2019 1413 Gross per 24 hour  Intake 10300 ml  Output 11375 ml  Net -1075 ml   Filed Weights   11/04/19 1241 11/05/19 0135  Weight: 123.4 kg 115.4 kg   Body mass index is 35.48 kg/m.  Exam: Constitutional: NAD, AAOx3, appeared tired HEENT: conjunctivae and lids normal, EOMI CV: RRR no M,R,G. Distal pulses +2.  No cyanosis.   RESP: CTA B/L, normal respiratory effort  GI: +BS, NTND Extremities: No effusions, edema, or tenderness in BLE SKIN: warm.  Skin over both lower legs have severe venous stasis changes, dry, leathery, at high risk of cracking, moisturized today Neuro: II - XII grossly intact.  Sensation intact Bladder irrigation still outputting pink urine, darker today.   Data Reviewed: CBC: Recent Labs  Lab 11/07/19 0645 11/08/19 0520 11/10/19 0506 11/11/19 0535 11/12/19 0434  WBC 11.2* 10.1 10.5 10.5 13.1*  HGB 7.7* 7.8* 7.9* 7.8* 7.9*  HCT 24.5* 24.8* 25.3* 24.7* 25.7*  MCV 86.9 87.9 87.2 87.6 88.3  PLT 272 285 334 304 027   Basic Metabolic Panel: Recent Labs  Lab 11/06/19 0514 11/07/19 0645 11/10/19 0506 11/11/19 0535 11/12/19 0434  NA 137 138 138 140 140  K 4.6 4.4 4.0 4.4 4.4  CL 104 105 104 107 105  CO2 24 24 25 23 25   GLUCOSE 128* 158* 170* 160* 183*  BUN 27* 23 14 16 20   CREATININE 1.62* 1.45* 1.44* 1.54* 1.63*  CALCIUM 9.2 9.3 9.5 9.3 9.3  MG  --   --  1.8 1.9 1.9   GFR: Estimated Creatinine Clearance: 42.8 mL/min (A) (by C-G formula based on SCr of 1.63 mg/dL (H)). Liver Function Tests: Recent Labs  Lab 11/07/19 0645  AST 23  ALT 31  ALKPHOS 111  BILITOT 1.0  PROT 6.2*  ALBUMIN 2.7*   No results for input(s): LIPASE, AMYLASE in the last 168 hours. No results for input(s): AMMONIA in the last 168 hours. Coagulation Profile: No results for  input(s): INR, PROTIME in the last 168 hours. Cardiac Enzymes: No results for input(s): CKTOTAL, CKMB, CKMBINDEX, TROPONINI in the last 168 hours. BNP (last 3 results) No results for input(s): PROBNP in the last 8760 hours. HbA1C: No results for input(s): HGBA1C in the last 72 hours. CBG: Recent Labs  Lab 11/11/19 1722 11/11/19 2101 11/12/19 0805 11/12/19 1153 11/12/19 1645  GLUCAP 152* 239* 167* 150* 254*   Lipid Profile: No results for input(s): CHOL, HDL, LDLCALC, TRIG, CHOLHDL, LDLDIRECT in the last 72 hours. Thyroid Function Tests: No results for input(s): TSH, T4TOTAL, FREET4, T3FREE, THYROIDAB in the last 72 hours. Anemia Panel: No results for input(s): VITAMINB12, FOLATE, FERRITIN, TIBC, IRON, RETICCTPCT in the last 72 hours. Urine analysis:    Component Value Date/Time   COLORURINE RED (A) 11/06/2019 1039   APPEARANCEUR HAZY (A) 11/06/2019 1039   LABSPEC 1.005 11/06/2019 1039   PHURINE  11/06/2019 1039    TEST NOT REPORTED DUE TO COLOR INTERFERENCE OF URINE PIGMENT   GLUCOSEU (A) 11/06/2019 1039    TEST  NOT REPORTED DUE TO COLOR INTERFERENCE OF URINE PIGMENT   HGBUR (A) 11/06/2019 1039    TEST NOT REPORTED DUE TO COLOR INTERFERENCE OF URINE PIGMENT   BILIRUBINUR (A) 11/06/2019 1039    TEST NOT REPORTED DUE TO COLOR INTERFERENCE OF URINE PIGMENT   KETONESUR (A) 11/06/2019 1039    TEST NOT REPORTED DUE TO COLOR INTERFERENCE OF URINE PIGMENT   PROTEINUR (A) 11/06/2019 1039    TEST NOT REPORTED DUE TO COLOR INTERFERENCE OF URINE PIGMENT   NITRITE (A) 11/06/2019 1039    TEST NOT REPORTED DUE TO COLOR INTERFERENCE OF URINE PIGMENT   LEUKOCYTESUR (A) 11/06/2019 1039    TEST NOT REPORTED DUE TO COLOR INTERFERENCE OF URINE PIGMENT   Sepsis Labs: @LABRCNTIP (procalcitonin:4,lacticidven:4)  ) Recent Results (from the past 240 hour(s))  Urine culture     Status: Abnormal   Collection Time: 11/04/19  3:52 PM   Specimen: Urine, Random  Result Value Ref Range Status    Specimen Description   Final    URINE, RANDOM Performed at [facility], [name], [place], [place] [address]    Special Requests   Final    NONE Performed at [facility], [address], [place], [place] [address]    Culture (A)  Final    <10,000 COLONIES/mL INSIGNIFICANT GROWTH Performed at [facility], [place] [address] [place], [place] [address]    Report Status 11/05/2019 FINAL  Final  SARS CORONAVIRUS 2 (TAT 6-24 HRS) Nasopharyngeal Nasopharyngeal Swab     Status: None   Collection Time: 11/04/19  8:06 PM   Specimen: Nasopharyngeal Swab  Result Value Ref Range Status   SARS Coronavirus 2 NEGATIVE NEGATIVE Final    Comment: (NOTE) SARS-CoV-2 target nucleic acids are NOT DETECTED. The SARS-CoV-2 RNA is generally detectable in upper and lower respiratory specimens during the acute phase of infection. Negative results do not preclude SARS-CoV-2 infection, do not rule out co-infections with other pathogens, and should not be used as the sole basis for treatment or other patient management decisions. Negative results must be combined with clinical observations, patient history, and epidemiological information. The expected result is Negative. Fact Sheet for Patients: SugarRoll.be Fact Sheet for Healthcare Providers: https://www.woods-mathews.com/ This test is not yet approved or cleared by the [place] FDA and  has been authorized for detection and/or diagnosis of SARS-CoV-2 by FDA under an Emergency Use Authorization (EUA). This EUA will remain  in effect (meaning this test can be used) for the duration of the COVID-19 declaration under Section 56 4(b)(1) of the Act, 21 U.S.C. section 360bbb-3(b)(1), unless the authorization is terminated or revoked sooner. Performed at [facility], [place] [address] [place], [place] [address]   Urine culture     Status: None   Collection Time: 11/06/19 10:39  AM   Specimen: Urine, Random  Result Value Ref Range Status   Specimen Description   Final    URINE, RANDOM Performed at [facility], [address] [place], [place] [address]    Special Requests   Final    NONE Performed at [facility], [address], [place], [place] [address]    Culture   Final    NO GROWTH Performed at [facility], [place] [address], [place], [place] [address]    Report Status 11/07/2019 FINAL  Final      Studies: No results found.  Scheduled Meds: . atorvastatin  80 mg Oral Daily  . carvedilol  25 mg Oral BID WC  . Chlorhexidine Gluconate Cloth  6 each Topical Daily  . DULoxetine  60 mg Oral Daily  . feeding supplement (ENSURE ENLIVE)  237 mL Oral BID BM  . finasteride  5 mg Oral Daily  . insulin aspart  0-15 Units Subcutaneous TID WC    Continuous Infusions: . sodium chloride irrigation Stopped (11/05/19 0035)     LOS: 8 days     Enzo Bi, MD [facility]   11/12/2019, 7:29 PM

## 2019-11-13 LAB — CBC
HCT: 25.6 % — ABNORMAL LOW (ref 39.0–52.0)
Hemoglobin: 7.9 g/dL — ABNORMAL LOW (ref 13.0–17.0)
MCH: 27 pg (ref 26.0–34.0)
MCHC: 30.9 g/dL (ref 30.0–36.0)
MCV: 87.4 fL (ref 80.0–100.0)
Platelets: 317 10*3/uL (ref 150–400)
RBC: 2.93 MIL/uL — ABNORMAL LOW (ref 4.22–5.81)
RDW: 15.5 % (ref 11.5–15.5)
WBC: 13.4 10*3/uL — ABNORMAL HIGH (ref 4.0–10.5)
nRBC: 0 % (ref 0.0–0.2)

## 2019-11-13 LAB — GLUCOSE, CAPILLARY
Glucose-Capillary: 196 mg/dL — ABNORMAL HIGH (ref 70–99)
Glucose-Capillary: 239 mg/dL — ABNORMAL HIGH (ref 70–99)
Glucose-Capillary: 282 mg/dL — ABNORMAL HIGH (ref 70–99)
Glucose-Capillary: 226 mg/dL — ABNORMAL HIGH (ref 70–99)

## 2019-11-13 LAB — MAGNESIUM: Magnesium: 2.1 mg/dL (ref 1.7–2.4)

## 2019-11-13 LAB — BASIC METABOLIC PANEL
Anion gap: 9 (ref 5–15)
BUN: 22 mg/dL (ref 8–23)
CO2: 26 mmol/L (ref 22–32)
Calcium: 9.2 mg/dL (ref 8.9–10.3)
Chloride: 102 mmol/L (ref 98–111)
Creatinine, Ser: 1.57 mg/dL — ABNORMAL HIGH (ref 0.61–1.24)
GFR calc Af Amer: 46 mL/min — ABNORMAL LOW (ref >60)
GFR calc non Af Amer: 40 mL/min — ABNORMAL LOW (ref >60)
Glucose, Bld: 202 mg/dL — ABNORMAL HIGH (ref 70–99)
Potassium: 4.3 mmol/L (ref 3.5–5.1)
Sodium: 137 mmol/L (ref 135–145)

## 2019-11-13 MED ORDER — ENSURE ENLIVE PO LIQD
237.0000 mL | Freq: Three times a day (TID) | ORAL | Status: DC
  Administered 2019-11-13 – 2019-11-17 (×11): 237 mL via ORAL

## 2019-11-13 MED ORDER — ADULT MULTIVITAMIN W/MINERALS CH
1.0000 | ORAL_TABLET | Freq: Every day | ORAL | Status: DC
  Administered 2019-11-13 – 2019-11-17 (×5): 1 via ORAL
  Filled 2019-11-13 (×5): qty 1

## 2019-11-13 NOTE — Progress Notes (Signed)
PROGRESS NOTE  [name] XNT:[id number] DOB: 1932/03/04 DOA: 11/04/2019 PCP: [name], PA-C  HPI/Recap of past 24 hours: Patient is an 86 year old male with past medical history of CAD status post stent, chronic diastolic heart failure, sleep apnea, diabetes mellitus, stage III chronic kidney disease and recent DVT diagnosed 3 months ago placed on Eliquis who presented with hematuria.  Per his assisted living facility, patient had been reportedly having hematuria for the past 5 days.  In the emergency room, patient was noted to have a white count of 11.9 and a hemoglobin of 8.3, down from 9.52 weeks prior.  He was noted to have gross hematuria with clots on bladder scan noted a third of a liter.  Patient was started on continuous bladder irrigation and urology was consulted.  Urology was able to irrigate the bladder with about a liter of normal saline and then the patient was set up for continuous bladder irrigation.  He was also transfused 1 unit packed red blood cells.  His Eliquis was held.  Following transfusion, patient's hemoglobin improved to 8.6, and by this afternoon, down to 7.9.  In discussion with urology, they suspect that he has metastatic prostate cancer.  CT scan of abdomen pelvis notes multiple areas of metastases.  PSA came back on 1/18 greater than 2000.  Oncology consulted & and have ordered CT scan of chest which is pending as well as bone scan which shows diffuse bone metastases.  Vascular surgery placed IVC filter on 1/18.  Orthopedics consulted for possible vertebroplasty at L1 site for pathologic fracture.   Subjective/interval history: Pt said he ate a little bit, and drank 2 Ensures.  Got up to the bed today but noted to be very weak by nursing.  No fever, dyspnea, chest pain, abdominal pain, N/V/D, increased swelling.   Assessment/Plan: Principal Problem:   Hematuria and acute blood loss anemia in patient with prostate cancer  --Anticoagulation  discontinued.  Being followed by urology.  Hemoglobin remaining stable for now. --bladder irrigation currently off. --continue Foley cath    CAD (coronary artery disease) stable.    Insulin dependent type 2 diabetes mellitus ([place]): On sliding scale.    Essential hypertension: Blood pressures have been stable to slightly on the high end.  Continue home coreg.    CKD (chronic kidney disease) stage 3a, GFR 30-59 ml/min: Stable.,  At baseline.    Chronic diastolic CHF (congestive heart failure) [facility]): Monitor closely.  Given blood and IVC fluid, likely could become hypervolemic easily.   --Diurese PRN --continue home coreg    Elevated LFTs, resolved  Metastatic prostate cancer with widespread metastases to bone as well as liver:  --PSA greater than 2000.  Oncology discussing treatment options.   The patient's wife passed away in the last year from pancreatic cancer.  Discussed with daughter.  Decided to go forward with treatment --treatment per Oncology  Pathologic L1 fracture minimally symptomatic --Ortho consulted, and rec observation    OSA on CPAP: Nightly CPAP.   Dyslipidemia: Continue statin.    Morbid obesity ([place]): Meets criteria for BMI greater than 35+ diabetes and hypertension and heart failure.    DVT (deep venous thrombosis) ([place]): Diagnosed 3 months ago.  Can no longer be on anticoagulation.  Vascular surgery placed IVC filter on 1/18  Normocytic anemia --Ferritin 1454 (acute phase reactant) with iron saturation 20% and TIBC 1389.  B12 and folate normal. --s/p 1u pRBC --Maintain hemoglobin 7-8.  Code Status: Full code --palliative care following  Family Communication: not today Disposition Plan: Need PT/OT, disposition undetermined at this time.   Consultants:  Urology  Vascular surgery  Oncology  Orthopedics  Procedures:  Bladder irrigation started 1/15  IVC filter placement 1/18  Antimicrobials:  None  DVT  prophylaxis: SCDs   Objective: Vitals:   11/13/19 0548 11/13/19 1135  BP: 116/73 105/60  Pulse: 72 80  Resp: 20 18  Temp: 98.1 F (36.7 C) 99.3 F (37.4 C)  SpO2: 98% 100%    Intake/Output Summary (Last 24 hours) at 11/13/2019 1647 Last data filed at 11/13/2019 1328 Gross per 24 hour  Intake 4240 ml  Output 5500 ml  Net -1260 ml   Filed Weights   11/04/19 1241 11/05/19 0135  Weight: 123.4 kg 115.4 kg   Body mass index is 35.48 kg/m.  Exam: Constitutional: NAD, AAOx3, appeared tired, sitting up in chair HEENT: conjunctivae and lids normal, EOMI CV: RRR no M,R,G. Distal pulses +2.  No cyanosis.   RESP: CTA B/L, normal respiratory effort  GI: +BS, NTND Extremities: No effusions, edema, or tenderness in BLE SKIN: warm.  Skin over both lower legs have severe venous stasis changes, dry, leathery, with grooves, however, much better looking after daily moisturization  Neuro: II - XII grossly intact.  Sensation intact Bladder irrigation still outputting pink urine, lighter today.   Data Reviewed: CBC: Recent Labs  Lab 11/08/19 0520 11/10/19 0506 11/11/19 0535 11/12/19 0434 11/13/19 0859  WBC 10.1 10.5 10.5 13.1* 13.4*  HGB 7.8* 7.9* 7.8* 7.9* 7.9*  HCT 24.8* 25.3* 24.7* 25.7* 25.6*  MCV 87.9 87.2 87.6 88.3 87.4  PLT 285 334 304 322 619   Basic Metabolic Panel: Recent Labs  Lab 11/07/19 0645 11/10/19 0506 11/11/19 0535 11/12/19 0434 11/13/19 0859  NA 138 138 140 140 137  K 4.4 4.0 4.4 4.4 4.3  CL 105 104 107 105 102  CO2 24 25 23 25 26   GLUCOSE 158* 170* 160* 183* 202*  BUN 23 14 16 20 22   CREATININE 1.45* 1.44* 1.54* 1.63* 1.57*  CALCIUM 9.3 9.5 9.3 9.3 9.2  MG  --  1.8 1.9 1.9 2.1   GFR: Estimated Creatinine Clearance: 44.4 mL/min (A) (by C-G formula based on SCr of 1.57 mg/dL (H)). Liver Function Tests: Recent Labs  Lab 11/07/19 0645  AST 23  ALT 31  ALKPHOS 111  BILITOT 1.0  PROT 6.2*  ALBUMIN 2.7*   No results for input(s): LIPASE,  AMYLASE in the last 168 hours. No results for input(s): AMMONIA in the last 168 hours. Coagulation Profile: No results for input(s): INR, PROTIME in the last 168 hours. Cardiac Enzymes: No results for input(s): CKTOTAL, CKMB, CKMBINDEX, TROPONINI in the last 168 hours. BNP (last 3 results) No results for input(s): PROBNP in the last 8760 hours. HbA1C: No results for input(s): HGBA1C in the last 72 hours. CBG: Recent Labs  Lab 11/12/19 1153 11/12/19 1645 11/12/19 2154 11/13/19 0724 11/13/19 1132  GLUCAP 150* 254* 251* 196* 239*   Lipid Profile: No results for input(s): CHOL, HDL, LDLCALC, TRIG, CHOLHDL, LDLDIRECT in the last 72 hours. Thyroid Function Tests: No results for input(s): TSH, T4TOTAL, FREET4, T3FREE, THYROIDAB in the last 72 hours. Anemia Panel: No results for input(s): VITAMINB12, FOLATE, FERRITIN, TIBC, IRON, RETICCTPCT in the last 72 hours. Urine analysis:    Component Value Date/Time   COLORURINE RED (A) 11/06/2019 1039   APPEARANCEUR HAZY (A) 11/06/2019 1039   LABSPEC 1.005 11/06/2019 1039   PHURINE  11/06/2019 1039  TEST NOT REPORTED DUE TO COLOR INTERFERENCE OF URINE PIGMENT   GLUCOSEU (A) 11/06/2019 1039    TEST NOT REPORTED DUE TO COLOR INTERFERENCE OF URINE PIGMENT   HGBUR (A) 11/06/2019 1039    TEST NOT REPORTED DUE TO COLOR INTERFERENCE OF URINE PIGMENT   BILIRUBINUR (A) 11/06/2019 1039    TEST NOT REPORTED DUE TO COLOR INTERFERENCE OF URINE PIGMENT   KETONESUR (A) 11/06/2019 1039    TEST NOT REPORTED DUE TO COLOR INTERFERENCE OF URINE PIGMENT   PROTEINUR (A) 11/06/2019 1039    TEST NOT REPORTED DUE TO COLOR INTERFERENCE OF URINE PIGMENT   NITRITE (A) 11/06/2019 1039    TEST NOT REPORTED DUE TO COLOR INTERFERENCE OF URINE PIGMENT   LEUKOCYTESUR (A) 11/06/2019 1039    TEST NOT REPORTED DUE TO COLOR INTERFERENCE OF URINE PIGMENT   Sepsis Labs: @LABRCNTIP (procalcitonin:4,lacticidven:4)  ) Recent Results (from the past 240 hour(s))  Urine  culture     Status: Abnormal   Collection Time: 11/04/19  3:52 PM   Specimen: Urine, Random  Result Value Ref Range Status   Specimen Description   Final    URINE, RANDOM Performed at [facility], [address], [place], [place] [address]    Special Requests   Final    NONE Performed at [facility], [address], [place], [place] [address]    Culture (A)  Final    <10,000 COLONIES/mL INSIGNIFICANT GROWTH Performed at [facility], [place] [address], [place], [place] [address]    Report Status 11/05/2019 FINAL  Final  SARS CORONAVIRUS 2 (TAT 6-24 HRS) Nasopharyngeal Nasopharyngeal Swab     Status: None   Collection Time: 11/04/19  8:06 PM   Specimen: Nasopharyngeal Swab  Result Value Ref Range Status   SARS Coronavirus 2 NEGATIVE NEGATIVE Final    Comment: (NOTE) SARS-CoV-2 target nucleic acids are NOT DETECTED. The SARS-CoV-2 RNA is generally detectable in upper and lower respiratory specimens during the acute phase of infection. Negative results do not preclude SARS-CoV-2 infection, do not rule out co-infections with other pathogens, and should not be used as the sole basis for treatment or other patient management decisions. Negative results must be combined with clinical observations, patient history, and epidemiological information. The expected result is Negative. Fact Sheet for Patients: SugarRoll.be Fact Sheet for Healthcare Providers: https://www.woods-mathews.com/ This test is not yet approved or cleared by the [place] FDA and  has been authorized for detection and/or diagnosis of SARS-CoV-2 by FDA under an Emergency Use Authorization (EUA). This EUA will remain  in effect (meaning this test can be used) for the duration of the COVID-19 declaration under Section 56 4(b)(1) of the Act, 21 U.S.C. section 360bbb-3(b)(1), unless the authorization is terminated or revoked sooner. Performed at  [facility], [place] [address] [place], [place] [address]   Urine culture     Status: None   Collection Time: 11/06/19 10:39 AM   Specimen: Urine, Random  Result Value Ref Range Status   Specimen Description   Final    URINE, RANDOM Performed at [facility], [address], [place], [place] [address]    Special Requests   Final    NONE Performed at [facility], [address], [place], [place] [address]    Culture   Final    NO GROWTH Performed at [facility], [place] [address], [place], [place] [address]    Report Status 11/07/2019 FINAL  Final  Studies: No results found.  Scheduled Meds: . atorvastatin  80 mg Oral Daily  . carvedilol  25 mg Oral BID WC  . Chlorhexidine Gluconate Cloth  6 each Topical Daily  . DULoxetine  60 mg Oral Daily  . feeding supplement (ENSURE ENLIVE)  237 mL Oral TID WC & HS  . finasteride  5 mg Oral Daily  . insulin aspart  0-15 Units Subcutaneous TID WC  . multivitamin with minerals  1 tablet Oral Daily    Continuous Infusions: . sodium chloride irrigation Stopped (11/05/19 0035)     LOS: 9 days     Enzo Bi, MD [facility]   11/13/2019, 4:47 PM

## 2019-11-13 NOTE — Progress Notes (Signed)
Initial Nutrition Assessment  RD working remotely.  DOCUMENTATION CODES:   Obesity unspecified  INTERVENTION:   -MVI with minerals daily -Increase Ensure Enlive po to QID, each supplement provides 350 kcal and 20 grams of protein -Downgrade diet to dysphagia 3 (advanced mechanical soft) for ease of intake  NUTRITION DIAGNOSIS:   Increased nutrient needs related to cancer and cancer related treatments as evidenced by estimated needs.  GOAL:   Patient will meet greater than or equal to 90% of their needs  MONITOR:   PO intake, Supplement acceptance, Labs, Weight trends, Skin, I & O's  REASON FOR ASSESSMENT:   Malnutrition Screening Tool    ASSESSMENT:   Patient is an 86 year old male with past medical history of CAD status post stent, chronic diastolic heart failure, sleep apnea, diabetes mellitus, stage III chronic kidney disease and recent DVT diagnosed 3 months ago placed on Eliquis who presented with hematuria.  Per his assisted living facility, patient had been reportedly having hematuria for the past 5 days.  In the emergency room, patient was noted to have a white count of 11.9 and a hemoglobin of 8.3, down from 9.52 weeks prior.  He was noted to have gross hematuria with clots on bladder scan noted a third of a liter.  Patient was started on continuous bladder irrigation and urology was consulted.  Urology was able to irrigate the bladder with about a liter of normal saline and then the patient was set up for continuous bladder irrigation.  He was also transfused 1 unit packed red blood cells.  His Eliquis was held.  Pt admitted with hematuria.   1/18- s/p IVC filter placement 1/19- per ortho, pathologic L1 fracture, plan for observation  Reviewed I/O's: -1.2 L x 24 hours and -18.2 L since admission  UOP: 5.8 L x 24 hours  Per oncology note on 11/11/19, renal stone on CT revealed nodular masses adjacent to prostate and colon; chest CT revealed rt pleural effusion with  pleural metastasis; bone scan revealed widespread bony metastasis. Pt and daughter amenable to start degarelix and abiraterone (to start Monday, 11/14/19).   Attempted to speak with pt via phone, however, no answer. Per chart review, pt with poor appetite PTA and experiencing frequent falls. Pt not eating solid foods at this time, however, did consume liquids (milk and Ensure) this AM. Noted meal completion documented at 30%.   Reviewed wt hx; pt has experienced a 5.9% wt loss over the past month, which is significant for time frame. Pt is at high risk for malnutrition due to metastatic disease and advanced age, however, RD unable to identify at this time.   Pt with poor oral intake and would benefit from nutrient dense supplement. One Ensure Enlive supplement provides 350 kcals, 20 grams protein, and 44-45 grams of carbohydrate vs one Glucerna shake supplement, which provides 220 kcals, 10 grams of protein, and 26 grams of carbohydrate. Given pt's hx of DM, RD will continue to monitor PO intake, CBGS, and adjust supplement regimen as appropriate.   Palliative care has been following for goals of care; pt and family desiring aggressive treatment at this time.   Lab Results  Component Value Date   HGBA1C 8.1 (H) 10/15/2019   PTA DM medications are 12 units insulin aspart TID with meals. Per ADA's Standards of Medical Care of Diabetes, glycemic targets for older adults who have multiple co-morbidities, cognitive impairments, and functional dependence should be less stringent (Hgb A1c <8.0-8.5).   Labs reviewed: CBGS: 751-025 (inpatient  orders for glycemic control are 0-15 units insulin aspart TID with meals).   Diet Order:   Diet Order            Diet Carb Modified Fluid consistency: Thin; Room service appropriate? Yes  Diet effective now              EDUCATION NEEDS:   No education needs have been identified at this time  Skin:  Skin Assessment: Reviewed RN Assessment  Last BM:   11/13/19  Height:   Ht Readings from Last 1 Encounters:  11/05/19 5\' 11"  (1.803 m)    Weight:   Wt Readings from Last 1 Encounters:  11/05/19 115.4 kg    Ideal Body Weight:  78.2 kg  BMI:  Body mass index is 35.48 kg/m.  Estimated Nutritional Needs:   Kcal:  2150-2350  Protein:  115-130 grams  Fluid:  > 2.1 L    Ebany Bowermaster A. Jimmye Norman, RD, LDN, Comfort Registered Dietitian II Certified Diabetes Care and Education Specialist Pager: [phone] After hours Pager: [phone]

## 2019-11-13 NOTE — Progress Notes (Signed)
[facility] Hematology/Oncology Progress Note  Date of admission: 11/04/2019  Hospital day:  11/13/2019  Chief Complaint: [name] is a 86 y.o. male who was admitted who with hematuria.  Subjective:  Patient denies any complaint.  Urine remains slightly pink.  Social History:  The patient is alone today.  Allergies:  Allergies  Allergen Reactions  . Metformin And Related Diarrhea    Intolerance due to naturally loose bowels    Scheduled Medications: . atorvastatin  80 mg Oral Daily  . carvedilol  25 mg Oral BID WC  . Chlorhexidine Gluconate Cloth  6 each Topical Daily  . DULoxetine  60 mg Oral Daily  . feeding supplement (ENSURE ENLIVE)  237 mL Oral TID WC & HS  . finasteride  5 mg Oral Daily  . insulin aspart  0-15 Units Subcutaneous TID WC  . multivitamin with minerals  1 tablet Oral Daily    Review of Systems: GENERAL:  Patient denies any complaint.  No fevers or sweats. PERFORMANCE STATUS (ECOG):  2-3 HEENT:  No visual changes, runny nose, sore throat, mouth sores or tenderness. Lungs: No shortness of breath or cough.  No hemoptysis. Cardiac:  No chest pain, palpitations, orthopnea, or PND. GI:  Eating better.  No nausea, vomiting, diarrhea, constipation, melena or hematochezia. GU:  Hematuria, improving.  No urgency, frequency, or dysuria. Musculoskeletal:  No back pain.  Chronic shoulder and knee pain.  No muscle tenderness. Extremities:  No pain or swelling. Skin:  No rashes or skin changes. Neuro:  No headache, numbness or weakness, balance or coordination issues. Endocrine:  Diabetes.  No thyroid issues, hot flashes or night sweats. Psych:  No mood changes, depression or anxiety. Pain:  No focal pain. Review of systems:  All other systems reviewed and found to be negative.   Physical Exam: Blood pressure 105/60, pulse 80, temperature 99.3 F (37.4 C), temperature source Axillary, resp. rate 18, height 5' 11"  (1.803 m), weight 254 lb  6.6 oz (115.4 kg), SpO2 100 %.  GENERAL:  Well developed, well nourished, elderly gentleman sitting comfortably in a lounge chair on the medical unit in no acute distress. MENTAL STATUS:  Alert and oriented to person, place and time. HEAD:  Short graying hair and beard.  Male pattern baldness.  Normocephalic, atraumatic, face symmetric, no Cushingoid features. EYES:  Brown eyes.  Pupils equal round and reactive to light and accomodation.  No conjunctivitis or scleral icterus. ENT:  Oropharynx clear without lesion.  Tongue normal. Mucous membranes moist.  RESPIRATORY:  Decreased breath sounds right base.  No rales, wheezes or rhonchi. CARDIOVASCULAR:  Regular rate and rhythm without murmur, rub or gallop. ABDOMEN:  Soft, non-tender, with active bowel sounds, and no appreciable hepatosplenomegaly.  No masses. GU:  Urine light pink. SKIN:  No rashes, ulcers or lesions. EXTREMITIES: No edema, no skin discoloration or tenderness.  No palpable cords. NEUROLOGICAL: Unremarkable. PSYCH:  Appropriate.   Results for orders placed or performed during the hospital encounter of 11/04/19 (from the past 48 hour(s))  Glucose, capillary     Status: Abnormal   Collection Time: 11/11/19  5:22 PM  Result Value Ref Range   Glucose-Capillary 152 (H) 70 - 99 mg/dL  Glucose, capillary     Status: Abnormal   Collection Time: 11/11/19  9:01 PM  Result Value Ref Range   Glucose-Capillary 239 (H) 70 - 99 mg/dL  Basic metabolic panel     Status: Abnormal   Collection Time: 11/12/19  4:34 AM  Result Value Ref Range   Sodium 140 135 - 145 mmol/L   Potassium 4.4 3.5 - 5.1 mmol/L   Chloride 105 98 - 111 mmol/L   CO2 25 22 - 32 mmol/L   Glucose, Bld 183 (H) 70 - 99 mg/dL   BUN 20 8 - 23 mg/dL   Creatinine, Ser 1.63 (H) 0.61 - 1.24 mg/dL   Calcium 9.3 8.9 - 10.3 mg/dL   GFR calc non Af Amer 38 (L) >60 mL/min   GFR calc Af Amer 44 (L) >60 mL/min   Anion gap 10 5 - 15    Comment: Performed at [facility], [address] [place], [place] [address]  CBC     Status: Abnormal   Collection Time: 11/12/19  4:34 AM  Result Value Ref Range   WBC 13.1 (H) 4.0 - 10.5 K/uL   RBC 2.91 (L) 4.22 - 5.81 MIL/uL   Hemoglobin 7.9 (L) 13.0 - 17.0 g/dL   HCT 25.7 (L) 39.0 - 52.0 %   MCV 88.3 80.0 - 100.0 fL   MCH 27.1 26.0 - 34.0 pg   MCHC 30.7 30.0 - 36.0 g/dL   RDW 15.2 11.5 - 15.5 %   Platelets 322 150 - 400 K/uL   nRBC 0.0 0.0 - 0.2 %    Comment: Performed at [facility], [address] [place], [place] [address]  Magnesium     Status: None   Collection Time: 11/12/19  4:34 AM  Result Value Ref Range   Magnesium 1.9 1.7 - 2.4 mg/dL    Comment: Performed at [facility], [address] [place], [place] [address]  Glucose, capillary     Status: Abnormal   Collection Time: 11/12/19  8:05 AM  Result Value Ref Range   Glucose-Capillary 167 (H) 70 - 99 mg/dL   Comment 1 Notify RN   Glucose, capillary     Status: Abnormal   Collection Time: 11/12/19 11:53 AM  Result Value Ref Range   Glucose-Capillary 150 (H) 70 - 99 mg/dL  Glucose, capillary     Status: Abnormal   Collection Time: 11/12/19  4:45 PM  Result Value Ref Range   Glucose-Capillary 254 (H) 70 - 99 mg/dL   Comment 1 Notify RN   Glucose, capillary     Status: Abnormal   Collection Time: 11/12/19  9:54 PM  Result Value Ref Range   Glucose-Capillary 251 (H) 70 - 99 mg/dL  Glucose, capillary     Status: Abnormal   Collection Time: 11/13/19  7:24 AM  Result Value Ref Range   Glucose-Capillary 196 (H) 70 - 99 mg/dL  Basic metabolic panel     Status: Abnormal   Collection Time: 11/13/19  8:59 AM  Result Value Ref Range   Sodium 137 135 - 145 mmol/L   Potassium 4.3 3.5 - 5.1 mmol/L   Chloride 102 98 - 111 mmol/L   CO2 26 22 - 32 mmol/L   Glucose, Bld 202 (H) 70 - 99 mg/dL   BUN 22 8 - 23 mg/dL   Creatinine, Ser 1.57 (H) 0.61 - 1.24 mg/dL   Calcium 9.2 8.9 - 10.3 mg/dL   GFR calc non Af Amer 40 (L) >60  mL/min   GFR calc Af Amer 46 (L) >60 mL/min   Anion gap 9 5 - 15    Comment: Performed at [facility], [address] [place], [place] [address]  CBC     Status: Abnormal   Collection  Time: 11/13/19  8:59 AM  Result Value Ref Range   WBC 13.4 (H) 4.0 - 10.5 K/uL   RBC 2.93 (L) 4.22 - 5.81 MIL/uL   Hemoglobin 7.9 (L) 13.0 - 17.0 g/dL   HCT 25.6 (L) 39.0 - 52.0 %   MCV 87.4 80.0 - 100.0 fL   MCH 27.0 26.0 - 34.0 pg   MCHC 30.9 30.0 - 36.0 g/dL   RDW 15.5 11.5 - 15.5 %   Platelets 317 150 - 400 K/uL   nRBC 0.0 0.0 - 0.2 %    Comment: Performed at [facility], [address] [place], [place] [address]  Magnesium     Status: None   Collection Time: 11/13/19  8:59 AM  Result Value Ref Range   Magnesium 2.1 1.7 - 2.4 mg/dL    Comment: Performed at [facility], [address] [place], [place] [address]  Glucose, capillary     Status: Abnormal   Collection Time: 11/13/19 11:32 AM  Result Value Ref Range   Glucose-Capillary 239 (H) 70 - 99 mg/dL   No results found.  Assessment:  Reyhan Moronta is a 86 y.o. male with metastatic prostate cancer.  He presented with gross hematuria.  PSA  was 2,241.0 on 11/06/2019.   Renal stone CT on 11/05/2019 revealed confluent nodular masses adjacent to the prostate and distal sigmoid colon with left pelvic, perirectal, retroperitoneal, and mesenteric adenopathy as well as probable peritoneal disease most marked in the right upper quadrant extending about the right diaphragmatic leaflet.  There was pelvic and suspected vertebral osseous metastatic disease with L1 compression fracture, likely pathologic.  There was a possible 2 cm right lower lobe lung nodule.  There was left hydronephrosis and proximal ureterectasis secondary to pelvic process (prior renal ultrasound on 10/16/2019 revealed no hydronephrosis).  Chest CT on 11/08/2019 revealed right pleural effusion with pleural metastasis (2.7 x 2.1 cm nodularity along  anterior right hemidiaphragm).  Osseous metastasis, including a dominant lesion involving the right-side of the L2 vertebral body.   Bone scan on 11/08/2019 revealed widespread bony metastasis (right occiput, clavicles, left upper extremity, sternum, ribs bilaterally, throughout the cervical, thoracic, lumbar spine, bony pelvis, proximal femurs, right tibia).  He received degarelix on 11/11/2019.  He has a monoclonal gammopathy.  SPEP on 10/16/2019 revealed a 0.4 gm/dL + an additional 0.2 gm/dL biclonal IgA protein with kappa specificity.  The patient has a history of DVT approximately 3 months ago.  IVC filter was placed on 11/07/2019.  Symptomatically, he denies any complaint.  Urine remains slightly pink.  Plan:   1.   Metastatic prostatic prostate cancer             Patient has extensive pelvic, perirectal, retroperitoneal and mesenteric adenopathy.             Patient has extensive osseous metastasis.             Suspect pleural based nodularity is prostate cancer.             No currrent intervention for  L1 pathologic compression as patient asymptomatic.             Patient received degarelix on 11/11/2019.  Abiraterone should arrive this week.  Patient declined biopsy for NGS and future treatment options. 2.   Hematuria             Aspirin, Plavix, and Eliquis remain held.             Patient s/p  IVC filter placement on 11/07/2019.             Urine remains slightly pink.  Foley to be maintained for 1-2 days with CBI off.  Possible voiding trial on Monday, 11/14/2019. 3.   Normocytic anemia             Hematocrit 24.8.  Hemoglobin 7.8.  MCV 87.9 on 11/08/2019.              Hematocrit 25.6.  Hemoglobin 7.9.  MCV 87.4 on 11/10/2019.  Hemoglobin stable to improved.  Etiology likely secondary to acute blood loss, anemia of chronic renal disease, and possible monoclonal gammopathy.                         Patient is s/p 1 unit of PRBCs on 11/05/2019.             Maintain  hemoglobin 7-8.             Transfuse PRBC as needed.             Ferritin 1454 (acute phase reactant) with iron saturation 20% and TIBC 1389.  B12 and folate normal. 4.   Monoclonal gammopathy  SPEP on 10/16/2019 revealed a 0.4 gm/dL + an additional 0.2 gm/dL biclonal IgA protein with kappa specificity.  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   CRAB criteria- hypercalcemia, renal disease, anemia, bone lesions.    Calcium is normal.  Anemia is likely multi-factorial.  Creatinine 1.45-1.52.      Some bone lesions are blastic (typical for prostate cancer) and some lytic (more typical for myeloma).  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  Patient and his family have discussed a bone marrow aspirate and biopsy.   Patient has decided to proceed.   Order placed. 5.   Disposition  Patient remains an inpatient.   Anticipate initiation of PT/OT in anticipation of discharge this week.   Lequita Asal, MD  11/13/2019, 3:20 PM

## 2019-11-14 ENCOUNTER — Inpatient Hospital Stay: Payer: Medicare Other

## 2019-11-14 LAB — CBC WITH DIFFERENTIAL/PLATELET
Abs Immature Granulocytes: 0.11 10*3/uL — ABNORMAL HIGH (ref 0.00–0.07)
Basophils Absolute: 0 10*3/uL (ref 0.0–0.1)
Basophils Relative: 0 %
Eosinophils Absolute: 0.2 10*3/uL (ref 0.0–0.5)
Eosinophils Relative: 1 %
HCT: 24.7 % — ABNORMAL LOW (ref 39.0–52.0)
Hemoglobin: 7.8 g/dL — ABNORMAL LOW (ref 13.0–17.0)
Immature Granulocytes: 1 %
Lymphocytes Relative: 10 %
Lymphs Abs: 1.3 10*3/uL (ref 0.7–4.0)
MCH: 27.6 pg (ref 26.0–34.0)
MCHC: 31.6 g/dL (ref 30.0–36.0)
MCV: 87.3 fL (ref 80.0–100.0)
Monocytes Absolute: 1 10*3/uL (ref 0.1–1.0)
Monocytes Relative: 8 %
Neutro Abs: 10.4 10*3/uL — ABNORMAL HIGH (ref 1.7–7.7)
Neutrophils Relative %: 80 %
Platelets: 373 10*3/uL (ref 150–400)
RBC: 2.83 MIL/uL — ABNORMAL LOW (ref 4.22–5.81)
RDW: 15.4 % (ref 11.5–15.5)
WBC: 13 10*3/uL — ABNORMAL HIGH (ref 4.0–10.5)
nRBC: 0 % (ref 0.0–0.2)

## 2019-11-14 LAB — CBC
HCT: 25.1 % — ABNORMAL LOW (ref 39.0–52.0)
Hemoglobin: 7.9 g/dL — ABNORMAL LOW (ref 13.0–17.0)
MCH: 27.3 pg (ref 26.0–34.0)
MCHC: 31.5 g/dL (ref 30.0–36.0)
MCV: 86.9 fL (ref 80.0–100.0)
Platelets: 335 10*3/uL (ref 150–400)
RBC: 2.89 MIL/uL — ABNORMAL LOW (ref 4.22–5.81)
RDW: 15.2 % (ref 11.5–15.5)
WBC: 12.4 10*3/uL — ABNORMAL HIGH (ref 4.0–10.5)
nRBC: 0 % (ref 0.0–0.2)

## 2019-11-14 LAB — BASIC METABOLIC PANEL
Anion gap: 11 (ref 5–15)
BUN: 29 mg/dL — ABNORMAL HIGH (ref 8–23)
CO2: 25 mmol/L (ref 22–32)
Calcium: 9.3 mg/dL (ref 8.9–10.3)
Chloride: 102 mmol/L (ref 98–111)
Creatinine, Ser: 1.61 mg/dL — ABNORMAL HIGH (ref 0.61–1.24)
GFR calc Af Amer: 45 mL/min — ABNORMAL LOW (ref >60)
GFR calc non Af Amer: 38 mL/min — ABNORMAL LOW (ref >60)
Glucose, Bld: 185 mg/dL — ABNORMAL HIGH (ref 70–99)
Potassium: 4.3 mmol/L (ref 3.5–5.1)
Sodium: 138 mmol/L (ref 135–145)

## 2019-11-14 LAB — GLUCOSE, CAPILLARY
Glucose-Capillary: 185 mg/dL — ABNORMAL HIGH (ref 70–99)
Glucose-Capillary: 146 mg/dL — ABNORMAL HIGH (ref 70–99)
Glucose-Capillary: 138 mg/dL — ABNORMAL HIGH (ref 70–99)
Glucose-Capillary: 143 mg/dL — ABNORMAL HIGH (ref 70–99)
Glucose-Capillary: 183 mg/dL — ABNORMAL HIGH (ref 70–99)

## 2019-11-14 LAB — MAGNESIUM: Magnesium: 2.1 mg/dL (ref 1.7–2.4)

## 2019-11-14 MED ORDER — PREDNISONE 5 MG PO TABS
5.0000 mg | ORAL_TABLET | Freq: Every day | ORAL | Status: AC
  Administered 2019-11-14: 14:00:00 5 mg via ORAL
  Filled 2019-11-14: qty 1

## 2019-11-14 MED ORDER — ABIRATERONE ACETATE 250 MG PO TABS
1000.0000 mg | ORAL_TABLET | Freq: Every day | ORAL | Status: AC
  Administered 2019-11-14: 1000 mg via ORAL
  Filled 2019-11-14: qty 4

## 2019-11-14 MED ORDER — HEPARIN SOD (PORK) LOCK FLUSH 100 UNIT/ML IV SOLN
INTRAVENOUS | Status: AC
  Filled 2019-11-14: qty 5

## 2019-11-14 MED ORDER — MIDAZOLAM HCL 5 MG/5ML IJ SOLN
INTRAMUSCULAR | Status: AC | PRN
  Administered 2019-11-14: 0.5 mg via INTRAVENOUS

## 2019-11-14 MED ORDER — FENTANYL CITRATE (PF) 100 MCG/2ML IJ SOLN
INTRAMUSCULAR | Status: AC | PRN
  Administered 2019-11-14 (×2): 25 ug via INTRAVENOUS

## 2019-11-14 MED ORDER — INSULIN ASPART 100 UNIT/ML ~~LOC~~ SOLN
0.0000 [IU] | Freq: Three times a day (TID) | SUBCUTANEOUS | Status: DC
  Administered 2019-11-14 (×2): 2 [IU] via SUBCUTANEOUS
  Administered 2019-11-15: 12:00:00 5 [IU] via SUBCUTANEOUS
  Administered 2019-11-15: 3 [IU] via SUBCUTANEOUS
  Administered 2019-11-15 – 2019-11-16 (×2): 8 [IU] via SUBCUTANEOUS
  Administered 2019-11-16: 11 [IU] via SUBCUTANEOUS
  Administered 2019-11-16: 3 [IU] via SUBCUTANEOUS
  Filled 2019-11-14 (×7): qty 1

## 2019-11-14 MED ORDER — MIDAZOLAM HCL 5 MG/5ML IJ SOLN
INTRAMUSCULAR | Status: AC
  Filled 2019-11-14: qty 5

## 2019-11-14 MED ORDER — FENTANYL CITRATE (PF) 100 MCG/2ML IJ SOLN
INTRAMUSCULAR | Status: AC
  Filled 2019-11-14: qty 2

## 2019-11-14 MED ORDER — PREDNISONE 5 MG PO TABS
5.0000 mg | ORAL_TABLET | Freq: Every day | ORAL | Status: DC
  Administered 2019-11-15 – 2019-11-17 (×3): 5 mg via ORAL
  Filled 2019-11-14 (×3): qty 1

## 2019-11-14 MED ORDER — ABIRATERONE ACETATE 250 MG PO TABS
1000.0000 mg | ORAL_TABLET | Freq: Every day | ORAL | Status: DC
  Administered 2019-11-15 – 2019-11-17 (×3): 1000 mg via ORAL
  Filled 2019-11-14 (×3): qty 4

## 2019-11-14 MED ORDER — HYDROCODONE-ACETAMINOPHEN 5-325 MG PO TABS: 1.0000 | ORAL_TABLET | ORAL | Status: DC | PRN

## 2019-11-14 MED ORDER — INSULIN ASPART 100 UNIT/ML ~~LOC~~ SOLN
5.0000 [IU] | Freq: Three times a day (TID) | SUBCUTANEOUS | Status: DC
  Administered 2019-11-14 – 2019-11-17 (×10): 5 [IU] via SUBCUTANEOUS
  Filled 2019-11-14 (×11): qty 1

## 2019-11-14 NOTE — Care Management Important Message (Signed)
Important Message  Patient Details  Name: [name] MRN: [id number] Date of Birth: 1931-11-10   Medicare Important Message Given:  Yes     Dannette Barbara 11/14/2019, 11:35 AM

## 2019-11-14 NOTE — Telephone Encounter (Signed)
Oral Chemotherapy Pharmacist Encounter  Zytiga home med delivered to [facility] inpatient pharmacy on 11/14/19 for patient to receive while in patient. Patient to be discharged with the remainder of the [facility] supply  Patient Education I spoke with patient's daughter [name] for overview of new oral chemotherapy medication: Zytiga (abiraterone) for the treatment of metastatic castration-sensitive prostate cancer in conjunction with prednisone 5 mg PO once daily and Firmagon (degarelix), planned duration until disease progression or unacceptable toxicity.   Counseled [name] on administration, dosing, side effects, monitoring, drug-food interactions, safe handling, storage, and disposal. Patient will take Zytiga 4 tablets (1,000 mg total) by mouth daily. Take on an empty stomach 1 hour before or 2 hours after a meal. He will also take prednisone 1 tablet (5 mg total) by mouth daily with breakfast. Begin taking with initiation of abiraterone.  Side effects include but not limited to: HTN, hot flashes, fatigue, decreased wbc.    Reviewed with patient importance of keeping a medication schedule and plan for any missed doses.  [name] voiced understanding and appreciation. All questions answered. Medication handout placed in the mail.  Provided patient with [facility] phone number. Patient knows to call the office with questions or concerns. Oral Chemotherapy [facility] will continue to follow.  Darl Pikes, PharmD, BCPS, [facility] Hematology/Oncology Clinical Pharmacist [facility]/HP/AP [facility] [phone]  11/14/2019 4:59 PM

## 2019-11-14 NOTE — Progress Notes (Signed)
PT Cancellation Note  Patient Details Name: [name] MRN: [id number] DOB: Nov 02, 1931   Cancelled Treatment:    Reason Eval/Treat Not Completed: Patient at procedure or test/unavailable(Pt off floor for procedure. Will attempt evaluation again at later date/time.)   10:56 AM, 11/14/19 [name], PT, DPT Physical Therapist - [facility]  [phone] ([place])    [name],[name] 11/14/2019, 10:56 AM

## 2019-11-14 NOTE — Consult Note (Signed)
Chief Complaint: Patient was seen in consultation today for a bone marrow biopsy.   Referring Physician(s): Nolon Stalls  Supervising Physician: Arne Cleveland  Patient Status: [place] - In-pt  History of Present Illness: [name] is a 86 y.o. male with a past medical history significant for BPH, anemia, DVT s/p IVC filter placement 11/07/19 (Dr. Lucky Cowboy), CAD s/p stenting, HTN, HLD, MI, PVD, DM, COPD, CKD III, gout and prostate cancer who presented to [facility] ED from ALF on 11/04/19 with complaints of hematuria. Per ALF staff patient was noted to have hematuria x 2 days while on Eliquis, Plavix and ASA (patient reported 5 day of hematuria). Initial workup significant hypertension, WBC 11.9, hgb 8.3 and INR 2. He was found to have gross hematuria with clots, anticoagulation was held and continuous bladder irrigation was started. He underwent a CT renal stone study on 11/05/19 which noted suspected metastatic prostate carcinoma with confluent nodular masses adjacent to the prostate and distal sigmoid colon with left pelvic, perirectal, retroperitoneal and mesenteric adenopathy as well as probable peritoneal disease; pelvic and suspected vertebral osseous metastatic disease with L1 compression fracture deformity. Oncology was consulted and a request for a bone marrow biopsy was placed to IR.  Patient laying in bed sleeping on exam, arouses briefly to voice cues but returns to sleep quickly. Unable to answer any questions really, stating "I don't know" when asked about pain, n/v, etc. He states he does not know anything about a bone marrow biopsy "but if that's what I need then go ahead." No family at bedside today. I spoke with patient's daughter [name] today who states they had a very in depth conversation with [name] over the weekend, including bone marrow biopsy, and they wish to proceed. She states her dad agreed to the bone marrow biopsy yesterday as well.  Past Medical History:   Diagnosis Date  . Anemia, normocytic normochromic   . BPH (benign prostatic hyperplasia)   . CAD (coronary artery disease)   . Cataracts, bilateral   . Chronic renal insufficiency   . Colon polyp   . COPD (chronic obstructive pulmonary disease) ([place])   . Diabetes mellitus without complication ([place])   . DVT (deep venous thrombosis) ([place])   . Edema of both legs   . Glaucoma   . Gout   . Heart murmur   . Hepatitis C antibody test positive 10/20/2019  . Hyperlipidemia   . Hypertension   . Moderate persistent asthma without complication   . Myocardial infarction ([place])   . Neuromuscular disorder ([place])   . Neuropathy   . Peripheral vascular disease ([place])   . Premature ejaculation   . Prostate cancer ([place])   . RLS (restless legs syndrome)   . Sleep apnea   . Stented coronary artery 11/13/2015  . Uncontrolled type 2 diabetes mellitus with stage 3 chronic kidney disease, with long-term current use of insulin ([place])     Past Surgical History:  Procedure Laterality Date  . APPENDECTOMY    . [place]  2009  . COLONOSCOPY  2012  . EYE SURGERY    . IVC FILTER INSERTION N/A 11/07/2019   Procedure: IVC FILTER INSERTION;  Surgeon: Algernon Huxley, MD;  Location: [facility];  Service: Cardiovascular;  Laterality: N/A;  . PROSTATE SURGERY    . TONSILLECTOMY      Allergies: Metformin and related  Medications: Prior to Admission medications   Medication Sig Start Date End Date Taking? Authorizing Provider  aspirin 81  MG chewable tablet Chew 81 mg by mouth daily.  07/15/19  Yes [provider]  atorvastatin (LIPITOR) 80 MG tablet Take 80 mg by mouth daily. 11/15/15  Yes [provider]  carvedilol (COREG) 25 MG tablet Take 25 mg by mouth 2 (two) times daily with a meal.   Yes [provider]  Cholecalciferol (VITAMIN D) 50 MCG (2000 UT) tablet Take 2,000 Units by mouth daily.    Yes [provider]  clopidogrel (PLAVIX) 75 MG tablet  Take 75 mg by mouth daily. 11/12/16  Yes [provider]  DULoxetine (CYMBALTA) 60 MG capsule Take 60 mg by mouth daily.   Yes [provider]  ELIQUIS 5 MG TABS tablet Take 5 mg by mouth every 12 (twelve) hours.  10/03/19  Yes [provider]  famotidine (PEPCID) 20 MG tablet Take 20 mg by mouth at bedtime as needed for heartburn.  08/26/19 08/25/20 Yes [provider]  finasteride (PROSCAR) 5 MG tablet Take 5 mg by mouth daily. 05/22/14  Yes [provider]  insulin aspart (NOVOLOG) 100 UNIT/ML FlexPen Inject 12 Units into the skin 3 (three) times daily with meals.    Yes [provider]  Insulin Detemir (LEVEMIR) 100 UNIT/ML Pen Inject 30 Units into the skin 2 (two) times daily. 10/20/19  Yes Samuella Cota, MD  Multiple Vitamin (MULTI-VITAMIN) tablet Take 1 tablet by mouth daily.   Yes [provider]  nitroGLYCERIN (NITROSTAT) 0.4 MG SL tablet Place 0.4 mg under the tongue every 5 (five) minutes as needed for chest pain.    Yes [provider]  abiraterone acetate (ZYTIGA) 250 MG tablet Take 4 tablets (1,000 mg total) by mouth daily. Take on an empty stomach 1 hour before or 2 hours after a meal 11/11/19   Lequita Asal, MD  predniSONE (DELTASONE) 5 MG tablet Take 1 tablet (5 mg total) by mouth daily with breakfast. Begin taking with initiation of abiraterone. 11/11/19   Lequita Asal, MD  torsemide (DEMADEX) 20 MG tablet Take 2 tablets (40 mg total) by mouth daily. Patient not taking: Reported on 11/04/2019 10/21/19   Samuella Cota, MD     History reviewed. No pertinent family history.  Social History   Socioeconomic History  . Marital status: Widowed    Spouse name: Not on file  . Number of children: Not on file  . Years of education: Not on file  . Highest education level: Not on file  Occupational History  . Not on file  Tobacco Use  . Smoking status: Former Research scientist (life sciences)  . Smokeless tobacco: Never Used   Substance and Sexual Activity  . Alcohol use: Not on file  . Drug use: Not on file  . Sexual activity: Not on file  Other Topics Concern  . Not on file  Social History Narrative  . Not on file   Social Determinants of Health   Financial Resource Strain:   . Difficulty of Paying Living Expenses: Not on file  Food Insecurity:   . Worried About Charity fundraiser in the Last Year: Not on file  . Ran Out of Food in the Last Year: Not on file  Transportation Needs:   . Lack of Transportation (Medical): Not on file  . Lack of Transportation (Non-Medical): Not on file  Physical Activity:   . Days of Exercise per Week: Not on file  . Minutes of Exercise per Session: Not on file  Stress:   . Feeling  of Stress : Not on file  Social Connections:   . Frequency of Communication with Friends and Family: Not on file  . Frequency of Social Gatherings with Friends and Family: Not on file  . Attends Religious Services: Not on file  . Active Member of Clubs or Organizations: Not on file  . Attends Archivist Meetings: Not on file  . Marital Status: Not on file     Review of Systems: A 12 point ROS discussed and pertinent positives are indicated in the HPI above.  All other systems are negative.  Review of Systems  Unable to perform ROS: Mental status change    Vital Signs: BP (!) 124/59 (BP Location: Left Arm)   Pulse 68   Temp 98.2 F (36.8 C) (Oral)   Resp 20   Ht 5' 11"  (1.803 m)   Wt 254 lb 6.6 oz (115.4 kg)   SpO2 100%   BMI 35.48 kg/m   Physical Exam Vitals and nursing note reviewed.  Constitutional:      General: He is not in acute distress.    Comments: Somnolent; poor historian  HENT:     Head: Normocephalic.     Mouth/Throat:     Mouth: Mucous membranes are moist.     Pharynx: Oropharynx is clear. No oropharyngeal exudate or posterior oropharyngeal erythema.  Cardiovascular:     Rate and Rhythm: Normal rate and regular rhythm.  Pulmonary:      Effort: Pulmonary effort is normal.     Breath sounds: Normal breath sounds.  Abdominal:     Palpations: Abdomen is soft.  Skin:    General: Skin is warm and dry.  Neurological:     Comments: Oriented to self and "hospital" only      MD Evaluation Airway: WNL Heart: WNL Abdomen: WNL Chest/ Lungs: WNL ASA  Classification: 4 Mallampati/Airway Score: One   Imaging: CT CHEST WO CONTRAST  Result Date: 11/08/2019 CLINICAL DATA:  Metastatic prostate cancer. Recently admitted for multiple falls with hematuria. Abnormal CT stone study, with findings suspicious for thoracic metastasis. EXAM: CT CHEST WITHOUT CONTRAST TECHNIQUE: Multidetector CT imaging of the chest was performed following the standard protocol without IV contrast. COMPARISON:  Abdominopelvic CT of 11/05/2019 FINDINGS: Cardiovascular: Aortic atherosclerosis. Tortuous thoracic aorta. Mild cardiomegaly, without pericardial effusion. Multivessel coronary artery atherosclerosis. Pulmonary artery enlargement, outflow tract 3.2 cm Mediastinum/Nodes: No mediastinal or definite hilar adenopathy, given limitations of unenhanced CT. Lungs/Pleura: Small right pleural effusion. Nodularity along the anterior right hemidiaphragm, including on coronal image 35. Example lesion at 2.7 x 2.1 cm on 105/2. Dependent right lower lobe subsegmental atelectasis. Pleural-based 8 mm density in the superior segment left lower lobe is favored to represent scarring or atelectasis on 51/3. Upper Abdomen: Deferred to recent abdominal CT. No acute superimposed process. Musculoskeletal: Moderate left and mild right gynecomastia. Lytic lesion involving the right side of the T2 vertebral body, including posterior elements. Adjacent medial right second rib lucency, including on 30/2. L1 mild-to-moderate compression deformity with underlying lytic lesion. IMPRESSION: 1. Right pleural effusion with pleural metastasis. 2. Areas of bilateral atelectasis, without pulmonary  metastasis. 3. Osseous metastasis, including a dominant lesion involving the right-side of the L2 vertebral body. Consider pre and postcontrast thoracic spine MRI for further evaluation. 4. Pulmonary artery enlargement suggests pulmonary arterial hypertension. 5. Aortic atherosclerosis (ICD10-I70.0) and emphysema (ICD10-J43.9). 6. Bilateral gynecomastia. Electronically Signed   By: Abigail Miyamoto M.D.   On: 11/08/2019 15:30   NM Bone Scan Whole Body  Result Date: 11/08/2019 CLINICAL DATA:  Metastatic prostate cancer. EXAM: NUCLEAR MEDICINE WHOLE BODY BONE SCAN TECHNIQUE: Whole body anterior and posterior images were obtained approximately 3 hours after intravenous injection of radiopharmaceutical. RADIOPHARMACEUTICALS:  20.5 mCi Technetium-63mMDP IV COMPARISON:  CT 11/05/2019. FINDINGS: Findings suggest left hydronephrosis and left hydroureter as noted on prior CT scan of 11/05/2019. Foley catheter noted. Multiple bony lesions are noted. Lesions noted in the right occiput, clavicles, left upper extremity, sternum, ribs bilaterally, throughout the cervical, thoracic, lumbar spine, bony pelvis, proximal femurs, right tibia. These findings are consistent with widespread metastatic disease. IMPRESSION: 1. Findings consistent with widespread bony metastatic disease. 2. Left hydronephrosis and hydroureter. Electronically Signed   By: TMarcello Moores Register   On: 11/08/2019 14:55   [place]NAL  Result Date: 10/16/2019 CLINICAL DATA:  Acute renal failure, chronic kidney disease stage 3. EXAM: RENAL / URINARY TRACT ULTRASOUND COMPLETE COMPARISON:  None. FINDINGS: Right Kidney: Renal measurements: 10.1 x 6.3 x 6.4 cm = volume: 215 mL. Mild increased cortical echogenicity. No mass or hydronephrosis visualized. 6.3 cm cyst over the mid pole. Left Kidney: Renal measurements: 9.5 x 6.1 x 6.3 cm = volume: 193 mL. Mild increased cortical echogenicity. No mass or hydronephrosis visualized. Bladder: Appears normal for degree of  bladder distention. Other: None. IMPRESSION: 1. Normal size kidneys without hydronephrosis. Mild increased cortical echogenicity which can be seen with medical renal disease. 2.  6.3 cm right renal cyst. Electronically Signed   By: DMarin OlpM.D.   On: 10/16/2019 09:17   PERIPHERAL VASCULAR CATHETERIZATION  Result Date: 11/07/2019 See op note  ECHOCARDIOGRAM COMPLETE  Result Date: 10/15/2019   ECHOCARDIOGRAM REPORT   Patient Name:   SESTIVEN KOHANDate of Exam: 10/15/2019 Medical Rec #:  [id number]      Height:       71.0 in Accession #:    [id number]     Weight:       275.0 lb Date of Birth:  521-Mar-1933      BSA:          2.41 m Patient Age:    89years        BP:           140/60 mmHg Patient Gender: M               HR:           67 bpm. Exam Location:  [facility] Procedure: 2D Echo, Color Doppler and Cardiac Doppler Indications:     I50.9 Congestive Heart Failure  History:         Patient has no prior history of Echocardiogram examinations.                  CAD and Previous Myocardial Infarction, PVD and COPD; Risk                  Factors:Diabetes, Hypertension and Dyslipidemia.  Sonographer:     JCharmayne SheerRDCS (AE) Referring Phys:  [id number] MasseDiagnosing Phys: TIda RogueMD  Sonographer Comments: Suboptimal parasternal window and no subcostal window. Pt moving throughout study due to SOB and Image acquisition challenging due to COPD. IMPRESSIONS  1. Left ventricular ejection fraction, by visual estimation, is 60 to 65%. The left ventricle has normal function. There is no left ventricular hypertrophy.  2. Left ventricular diastolic parameters are consistent with Grade I diastolic dysfunction (impaired relaxation).  3. The left ventricle has no regional wall motion  abnormalities.  4. Global right ventricle has normal systolic function.The right ventricular size is normal. No increase in right ventricular wall thickness.  5. Left atrial size was mildly dilated.  6. Moderately elevated  pulmonary artery systolic pressure.  7. The tricuspid regurgitant velocity is 3.12 m/s, and with an assumed right atrial pressure of 10 mmHg, the estimated right ventricular systolic pressure is moderately elevated at 48.9 mmHg. FINDINGS  Left Ventricle: Left ventricular ejection fraction, by visual estimation, is 60 to 65%. The left ventricle has normal function. The left ventricle has no regional wall motion abnormalities. There is no left ventricular hypertrophy. Left ventricular diastolic parameters are consistent with Grade I diastolic dysfunction (impaired relaxation). Normal left atrial pressure. Right Ventricle: The right ventricular size is normal. No increase in right ventricular wall thickness. Global RV systolic function is has normal systolic function. The tricuspid regurgitant velocity is 3.12 m/s, and with an assumed right atrial pressure  of 10 mmHg, the estimated right ventricular systolic pressure is moderately elevated at 48.9 mmHg. Left Atrium: Left atrial size was mildly dilated. Right Atrium: Right atrial size was normal in size Pericardium: There is no evidence of pericardial effusion. Mitral Valve: The mitral valve is normal in structure. Mild mitral valve regurgitation. No evidence of mitral valve stenosis by observation. MV peak gradient, 4.6 mmHg. Tricuspid Valve: The tricuspid valve is normal in structure. Tricuspid valve regurgitation is not demonstrated. Aortic Valve: The aortic valve was not well visualized. Aortic valve regurgitation is not visualized. Mild aortic valve sclerosis is present, with no evidence of aortic valve stenosis. Aortic valve mean gradient measures 7.0 mmHg. Aortic valve peak gradient measures 11.6 mmHg. Aortic valve area, by VTI measures 3.20 cm. Pulmonic Valve: The pulmonic valve was normal in structure. Pulmonic valve regurgitation is not visualized. Pulmonic regurgitation is not visualized. Aorta: The aortic root, ascending aorta and aortic arch are all  structurally normal, with no evidence of dilitation or obstruction. Venous: The inferior vena cava is normal in size with greater than 50% respiratory variability, suggesting right atrial pressure of 3 mmHg. IAS/Shunts: No atrial level shunt detected by color flow Doppler. There is no evidence of a patent foramen ovale. No ventricular septal defect is seen or detected. There is no evidence of an atrial septal defect.  LEFT VENTRICLE PLAX 2D LVIDd:         4.58 cm  Diastology LVIDs:         3.28 cm  LV e' lateral:   9.36 cm/s LV PW:         1.15 cm  LV E/e' lateral: 9.2 LV IVS:        0.82 cm  LV e' medial:    6.64 cm/s LVOT diam:     2.40 cm  LV E/e' medial:  13.0 LV SV:         53 ml LV SV Index:   20.73 LVOT Area:     4.52 cm  RIGHT VENTRICLE RV Basal diam:  3.04 cm LEFT ATRIUM             Index       RIGHT ATRIUM           Index LA diam:        4.40 cm 1.82 cm/m  RA Area:     12.60 cm LA Vol (A2C):   62.9 ml 26.05 ml/m RA Volume:   27.60 ml  11.43 ml/m LA Vol (A4C):   52.2 ml 21.62 ml/m LA Biplane  Vol: 57.9 ml 23.98 ml/m  AORTIC VALVE                    PULMONIC VALVE AV Area (Vmax):    3.41 cm     PV Vmax:       1.04 m/s AV Area (Vmean):   3.30 cm     PV Vmean:      71.000 cm/s AV Area (VTI):     3.20 cm     PV VTI:        0.264 m AV Vmax:           170.00 cm/s  PV Peak grad:  4.3 mmHg AV Vmean:          125.000 cm/s PV Mean grad:  2.0 mmHg AV VTI:            0.401 m AV Peak Grad:      11.6 mmHg AV Mean Grad:      7.0 mmHg LVOT Vmax:         128.00 cm/s LVOT Vmean:        91.100 cm/s LVOT VTI:          0.284 m LVOT/AV VTI ratio: 0.71  AORTA Ao Root diam: 3.30 cm MITRAL VALVE                        TRICUSPID VALVE MV Area (PHT): 3.53 cm             TR Peak grad:   38.9 mmHg MV Peak grad:  4.6 mmHg             TR Vmax:        312.00 cm/s MV Mean grad:  2.0 mmHg MV Vmax:       1.07 m/s             SHUNTS MV Vmean:      68.9 cm/s            Systemic VTI:  0.28 m MV VTI:        0.38 m                Systemic Diam: 2.40 cm MV PHT:        62.35 msec MV Decel Time: 215 msec MV E velocity: 86.30 cm/s 103 cm/s MV A velocity: 94.10 cm/s 70.3 cm/s MV E/A ratio:  0.92       1.5  Ida Rogue MD Electronically signed by Ida Rogue MD Signature Date/Time: 10/15/2019/3:57:33 PM    Final    CT RENAL STONE STUDY  Result Date: 11/05/2019 CLINICAL DATA:  Hematuria of indeterminate etiology. EXAM: CT ABDOMEN AND PELVIS WITHOUT CONTRAST TECHNIQUE: Multidetector CT imaging of the abdomen and pelvis was performed following the standard protocol without IV contrast. COMPARISON:  None available FINDINGS: Lower chest: Extensive coronary calcifications. Small right pleural effusion with atelectasis/consolidation in the adjacent right lower lobe. Possible 1.5 cm subpleural nodule, lateral basal segment right lower lobe. Nodules about the right diaphragmatic leaflet. Hepatobiliary: No liver lesion or biliary ductal dilatation. Gallbladder nondilated. Pancreas: Unremarkable. No pancreatic ductal dilatation or surrounding inflammatory changes. Spleen: Normal in size without focal abnormality. Probable accessory splenule. Adrenals/Urinary Tract: Adrenal glands unremarkable. Mild left hydronephrosis and ureterectasis down to the level of pelvic mass. 6.5 cm exophytic fluid attenuation lesion from the mid right kidney possibly cyst but incompletely characterized. Similar smaller 1.7 cm nonspecific medial right renal lesion. Urinary bladder partially decompressed by  Foley catheter. There is a small amount of gas in the lumen. There is high density material in the lumen of the bladder suggesting clot. Prostate hypertrophy/mass protrudes into the lumen of the urinary bladder. Stomach/Bowel: Stomach is decompressed. Small bowel is nondistended. Moderate fecal material in the proximal colon. There is gaseous distention of the redundant sigmoid colon. This distal sigmoid colon is decompressed and surrounded by multiple retroperitoneal  nodules. Rectum decompressed, unremarkable. Vascular/Lymphatic: Aortoiliac atherosclerosis (ICD10-170.0) without aneurysm. Bulky left external and common iliac and perirectal adenopathy. Subcentimeter left para-aortic and aortocaval nodes. Bulky confluent nodules/adenopathy extends from prostate along the distal sigmoid colon, with scattered nodules in the sigmoid mesocolon. Reproductive: Bulky prostate enlargement with adjacent confluent masses/aadenopathy as above. Other: Peritoneal nodules in the right upper quadrant, confluent over the dome of the liver. 1.6 cm nodule in the right pericolic gutter. Musculoskeletal: 2 cm lytic lesion in the right iliac wing. Heterogenous appearance of lower thoracic and lumbar vertebral bodies and sacrum, with probable pathologic compression deformity L1 vertebral body. IMPRESSION: 1. Suspected metastatic prostate carcinoma with confluent nodular masses adjacent to the prostate and distal sigmoid colon with left pelvic, perirectal, retroperitoneal, and mesenteric adenopathy as well as probable peritoneal disease most marked in the right upper quadrant extending about the right diaphragmatic leaflet. 2. Pelvic and suspected vertebral osseous metastatic disease with L1 compression fracture deformity, likely pathologic. 3. Possible 2 cm right lower lobe lung nodule. 4. Left hydronephrosis and proximal ureterectasis secondary to pelvic process. Electronically Signed   By: Lucrezia Europe M.D.   On: 11/05/2019 13:20    Labs:  CBC: Recent Labs    11/11/19 0535 11/12/19 0434 11/13/19 0859 11/14/19 0649  WBC 10.5 13.1* 13.4* 13.0*  12.4*  HGB 7.8* 7.9* 7.9* 7.8*  7.9*  HCT 24.7* 25.7* 25.6* 24.7*  25.1*  PLT 304 322 317 373  335    COAGS: Recent Labs    11/04/19 1527  INR 2.0*  APTT 53*    BMP: Recent Labs    11/11/19 0535 11/12/19 0434 11/13/19 0859 11/14/19 0649  NA 140 140 137 138  K 4.4 4.4 4.3 4.3  CL 107 105 102 102  CO2 23 25 26 25   GLUCOSE 160*  183* 202* 185*  BUN 16 20 22  29*  CALCIUM 9.3 9.3 9.2 9.3  CREATININE 1.54* 1.63* 1.57* 1.61*  GFRNONAA 41* 38* 40* 38*  GFRAA 47* 44* 46* 45*    LIVER FUNCTION TESTS: Recent Labs    10/14/19 2338 10/16/19 0629 10/17/19 0528 10/20/19 1050 11/04/19 1248 11/07/19 0645  BILITOT 0.7  --  1.2  --  1.1 1.0  AST 93*  --  43*  --  43* 23  ALT 86*  --  71*  --  45* 31  ALKPHOS 196*  --  146*  --  159* 111  PROT 7.5  --  6.9  --  7.0 6.2*  ALBUMIN 3.2*   < > 3.2* 3.8 3.2* 2.7*   < > = values in this interval not displayed.    TUMOR MARKERS: No results for input(s): AFPTM, CEA, CA199, CHROMGRNA in the last 8760 hours.  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 IR has been asked to perform a bone marrow biopsy to further guide treatment planning.  Patient has been NPO since midnight, anticoagulation has been held since admission. Afebrile, WBC 13.0, hgb 7.8, plt 373, creatinine  1.61.  Risks and benefits of bone marrow biopsy was discussed with the patient and/or patient's family including, but not limited to bleeding, infection, damage to adjacent structures or low yield requiring additional tests.  All of the questions were answered and there is agreement to proceed.  Consent signed and in chart.   Thank you for this interesting consult.  I greatly enjoyed meeting Jatniel Verastegui and look forward to participating in their care.  A copy of this report was sent to the requesting provider on this date.  Electronically Signed: Joaquim Nam, PA-C 11/14/2019, 8:34 AM   I spent a total of 40 Minutes in face to face in clinical consultation, greater than 50% of which was counseling/coordinating care for bone marrow biopsy.

## 2019-11-14 NOTE — Sedation Documentation (Signed)
Total conscious sedation: Versed 0.5 mg IV, Fentanyl 50 mcg IV. Pt. Tolerated procedure well.

## 2019-11-14 NOTE — Progress Notes (Signed)
Urology Inpatient Progress Note  Subjective: [name] is a 86 y.o. male admitted on 11/04/2019 with gross hematuria x5 days on multiple anticoagulants, now holding anticoagulation and s/p IVC filter placement on CBI.  PSA >2000 with CT findings suggestive of metastatic prostate cancer, he received degarelix on 11/11/2019 with plans to start abiraterone this week.   Slow drip CBI this morning with clear urinary output.  Hemoglobin stable at 7.9.  Creatinine stable at 1.61.  No acute concerns today.  Patient denies pain.  Anti-infectives: Anti-infectives (From admission, onward)   Start     Dose/Rate Route Frequency Ordered Stop   11/08/19 0000  ceFAZolin (ANCEF) IVPB 1 g/50 mL premix    Note to Pharmacy: To be given in specials   1 g 100 mL/hr over 30 Minutes Intravenous  Once 11/07/19 1428 11/07/19 1723      Current Facility-Administered Medications  Medication Dose Route Frequency Provider Last Rate Last Admin  . atorvastatin (LIPITOR) tablet 80 mg  80 mg Oral Daily Algernon Huxley, MD   80 mg at 11/13/19 1805  . carvedilol (COREG) tablet 25 mg  25 mg Oral BID WC Algernon Huxley, MD   25 mg at 11/13/19 1805  . Chlorhexidine Gluconate Cloth 2 % PADS 6 each  6 each Topical Daily Algernon Huxley, MD   6 each at 11/13/19 0900  . DULoxetine (CYMBALTA) DR capsule 60 mg  60 mg Oral Daily Algernon Huxley, MD   60 mg at 11/13/19 0858  . feeding supplement (ENSURE ENLIVE) (ENSURE ENLIVE) liquid 237 mL  237 mL Oral TID WC & HS Enzo Bi, MD   237 mL at 11/13/19 2308  . finasteride (PROSCAR) tablet 5 mg  5 mg Oral Daily Algernon Huxley, MD   5 mg at 11/13/19 0858  . insulin aspart (novoLOG) injection 0-15 Units  0-15 Units Subcutaneous TID WC Enzo Bi, MD      . insulin aspart (novoLOG) injection 5 Units  5 Units Subcutaneous TID WC Enzo Bi, MD      . multivitamin with minerals tablet 1 tablet  1 tablet Oral Daily Enzo Bi, MD   1 tablet at 11/13/19 1805  . ondansetron (ZOFRAN) injection 4 mg  4 mg  Intravenous Q6H PRN Algernon Huxley, MD   4 mg at 11/13/19 1810  . opium-belladonna (B&O SUPPRETTES) 16.2-60 MG suppository 1 suppository  1 suppository Rectal Q8H PRN Algernon Huxley, MD      . oxyCODONE-acetaminophen (PERCOCET/ROXICET) 5-325 MG per tablet 1 tablet  1 tablet Oral Q8H PRN Basilio Cairo, NP   1 tablet at 11/10/19 1540  . sodium chloride irrigation 0.9 % 3,000 mL  3,000 mL Irrigation Continuous Algernon Huxley, MD 0 mL/hr at 11/05/19 0035 3,000 mL at 11/14/19 0217   Objective: Vital signs in last 24 hours: Temp:  [98.2 F (36.8 C)-99.3 F (37.4 C)] 98.2 F (36.8 C) (01/25 0409) Pulse Rate:  [68-80] 68 (01/25 0409) Resp:  [18-20] 20 (01/25 0409) BP: (105-135)/(59-66) 124/59 (01/25 0409) SpO2:  [99 %-100 %] 100 % (01/25 0409)  Intake/Output from previous day: 01/24 0701 - 01/25 0700 In: 840 [P.O.:240] Out: 700 [Urine:700] Intake/Output this shift: No intake/output data recorded.  Physical Exam Vitals and nursing note reviewed.  Constitutional:      General: He is not in acute distress.    Appearance: He is not ill-appearing, toxic-appearing or diaphoretic.  HENT:     Head: Normocephalic and atraumatic.  Pulmonary:  Effort: Pulmonary effort is normal. No respiratory distress.  Skin:    General: Skin is warm and dry.  Neurological:     Mental Status: He is lethargic.  Psychiatric:        Cognition and Memory: Cognition normal.        Judgment: Judgment normal.    Lab Results:  Recent Labs    11/13/19 0859 11/14/19 0649  WBC 13.4* 13.0*  12.4*  HGB 7.9* 7.8*  7.9*  HCT 25.6* 24.7*  25.1*  PLT 317 373  335   BMET Recent Labs    11/13/19 0859 11/14/19 0649  NA 137 138  K 4.3 4.3  CL 102 102  CO2 26 25  GLUCOSE 202* 185*  BUN 22 29*  CREATININE 1.57* 1.61*  CALCIUM 9.2 9.3   Assessment & Plan: 86 year old male admitted with gross hematuria on multiple anticoagulants with new findings suggestive of metastatic prostate cancer.  On CBI, holding  anticoagulation, s/p IVC filter placement, started on degarelix.  Hemoglobin stable.  I turned CBI off this morning, unclear how long it had been on.  Okay to proceed with voiding trial today.  Recommend Foley removal this morning with follow-up bladder scan this afternoon.  Counsel patient to push fluids in the interim.  Debroah Loop, PA-C 11/14/2019

## 2019-11-14 NOTE — Progress Notes (Signed)
Inpatient Diabetes Program Recommendations  AACE/ADA: New Consensus Statement on Inpatient Glycemic Control (2015)  Target Ranges:  Prepandial:   less than 140 mg/dL      Peak postprandial:   less than 180 mg/dL (1-2 hours)      Critically ill patients:  140 - 180 mg/dL   Lab Results  Component Value Date   GLUCAP 138 (H) 11/14/2019   HGBA1C 8.1 (H) 10/15/2019    Review of Glycemic Control  Diabetes history: DM2 Outpatient Diabetes medications: Levemir 30 units bid + Novolog 12 units tid Current orders for Inpatient glycemic control: Novolog 5 units tid + Novolog moderate correction tid  Inpatient Diabetes Program Recommendations:   Patient currently having bone marrow biopsy. Will follow.  Thank you, Nani Gasser. Lysha Schrade, RN, MSN, CDE  Diabetes Coordinator Inpatient Glycemic Control Team Team Pager [phone] (8am-5pm) 11/14/2019 2:16 PM

## 2019-11-14 NOTE — Procedures (Signed)
  Procedure: CT bone marrow biopsy   EBL:   minimal Complications:  none immediate  See full dictation in [facility].  Dillard Cannon MD Main # [phone] Pager  [phone]

## 2019-11-14 NOTE — Progress Notes (Signed)
PROGRESS NOTE  [name] MIW:[id number] DOB: 04/18/1932 DOA: 11/04/2019 PCP: [name], PA-C  HPI/Recap of past 24 hours: Patient is an 86 year old AA male with past medical history of CAD status post stent, chronic diastolic heart failure, sleep apnea, diabetes mellitus, stage III chronic kidney disease and recent DVT diagnosed 3 months ago placed on Eliquis who presented with hematuria.  Per his assisted living facility, patient had been reportedly having hematuria for the past 5 days.  In the emergency room, patient was noted to have a white count of 11.9 and a hemoglobin of 8.3, down from 9.52 weeks prior.  He was noted to have gross hematuria with clots on bladder scan noted a third of a liter.  Patient was started on continuous bladder irrigation and urology was consulted.  Urology was able to irrigate the bladder with about a liter of normal saline and then the patient was set up for continuous bladder irrigation.  He was also transfused 1 unit packed red blood cells.  His Eliquis was held.  Following transfusion, patient's hemoglobin improved to 8.6, and by this afternoon, down to 7.9.  In discussion with urology, they suspect that he has metastatic prostate cancer.  CT scan of abdomen pelvis notes multiple areas of metastases.  PSA came back on 1/18 greater than 2000.  Oncology consulted & and have ordered CT scan of chest which is pending as well as bone scan which shows diffuse bone metastases.  Vascular surgery placed IVC filter on 1/18.  Orthopedics consulted for possible vertebroplasty at L1 site for pathologic fracture.   Subjective/interval history: Pt reported no change today.  No fever, dyspnea, chest pain, abdominal pain, N/V/D.  Pt received bone marrow biopsy.  Foley removed and pt is undergoing voiding trial.    Assessment/Plan: Principal Problem:    Hematuria and acute blood loss anemia in patient with prostate cancer  --Anticoagulation discontinued.  Being  followed by urology.  Hemoglobin remaining stable for now. --bladder irrigation turned off today --Remove foley today and bladder scan q4h.  If >500 ml, please call oncall urology or hospitalist.  No I/O cath by nursing due to friable prostate.    CAD (coronary artery disease) stable.    Insulin dependent type 2 diabetes mellitus ([place]): On sliding scale.    Essential hypertension: Blood pressures have been stable to slightly on the high end.  Continue home coreg.    CKD (chronic kidney disease) stage 3a, GFR 30-59 ml/min: Stable.,  At baseline.    Chronic diastolic CHF (congestive heart failure) [facility]): Monitor closely.  Given blood and IVC fluid, likely could become hypervolemic easily.   --Diurese PRN --continue home coreg    Elevated LFTs, resolved  Metastatic prostate cancer with widespread metastases to bone as well as liver:  --PSA greater than 2000.  Oncology discussing treatment options.   The patient's wife passed away in the last year from pancreatic cancer.  Discussed with daughter.  Decided to go forward with treatment --treatment per Oncology  Pathologic L1 fracture minimally symptomatic --Ortho consulted, and rec observation    OSA on CPAP: Nightly CPAP.   Dyslipidemia: Continue statin.    Morbid obesity ([place]): Meets criteria for BMI greater than 35+ diabetes and hypertension and heart failure.    DVT (deep venous thrombosis) ([place]): Diagnosed 3 months ago.  Can no longer be on anticoagulation.  Vascular surgery placed IVC filter on 1/18  Normocytic anemia --Ferritin 1454 (acute phase reactant) with iron saturation 20% and TIBC 1389.  B12 and folate normal. --s/p 1u pRBC --Maintain hemoglobin 7-8.  Code Status: Full code --palliative care following  Family Communication: not today Disposition Plan: PT rec SNF.  Can discharge if no more urological issues (passes voiding trial, no more hematuria, Hgb stable), and a SNF bed is  found.  Consultants:  Urology  Vascular surgery  Oncology  Orthopedics  Procedures:  Bladder irrigation started 1/15  IVC filter placement 1/18  Antimicrobials:  None  DVT prophylaxis: SCDs   Objective: Vitals:   11/14/19 1321 11/14/19 2021  BP: 135/68 136/61  Pulse: 63 62  Resp: 18 16  Temp: 97.8 F (36.6 C) 97.9 F (36.6 C)  SpO2: 100% 99%    Intake/Output Summary (Last 24 hours) at 11/14/2019 2126 Last data filed at 11/14/2019 1800 Gross per 24 hour  Intake 600 ml  Output 700 ml  Net -100 ml   Filed Weights   11/04/19 1241 11/05/19 0135  Weight: 123.4 kg 115.4 kg   Body mass index is 35.48 kg/m.  Exam: Constitutional: NAD, AAOx3, lethargic, in bed HEENT: conjunctivae and lids normal, EOMI CV: RRR no M,R,G. Distal pulses +2.  No cyanosis.   RESP: CTA B/L, normal respiratory effort  GI: +BS, NTND Extremities: No effusions, edema, or tenderness in BLE SKIN: warm.  Skin over both lower legs have severe venous stasis changes, dry, leathery, with grooves, however, much better looking after daily moisturization  Neuro: II - XII grossly intact.  Sensation intact   Data Reviewed: CBC: Recent Labs  Lab 11/10/19 0506 11/11/19 0535 11/12/19 0434 11/13/19 0859 11/14/19 0649  WBC 10.5 10.5 13.1* 13.4* 13.0*  12.4*  NEUTROABS  --   --   --   --  10.4*  HGB 7.9* 7.8* 7.9* 7.9* 7.8*  7.9*  HCT 25.3* 24.7* 25.7* 25.6* 24.7*  25.1*  MCV 87.2 87.6 88.3 87.4 87.3  86.9  PLT 334 304 322 317 373  631   Basic Metabolic Panel: Recent Labs  Lab 11/10/19 0506 11/11/19 0535 11/12/19 0434 11/13/19 0859 11/14/19 0649  NA 138 140 140 137 138  K 4.0 4.4 4.4 4.3 4.3  CL 104 107 105 102 102  CO2 25 23 25 26 25   GLUCOSE 170* 160* 183* 202* 185*  BUN 14 16 20 22  29*  CREATININE 1.44* 1.54* 1.63* 1.57* 1.61*  CALCIUM 9.5 9.3 9.3 9.2 9.3  MG 1.8 1.9 1.9 2.1 2.1   GFR: Estimated Creatinine Clearance: 43.3 mL/min (A) (by C-G formula based on SCr of 1.61  mg/dL (H)). Liver Function Tests: No results for input(s): AST, ALT, ALKPHOS, BILITOT, PROT, ALBUMIN in the last 168 hours. No results for input(s): LIPASE, AMYLASE in the last 168 hours. No results for input(s): AMMONIA in the last 168 hours. Coagulation Profile: No results for input(s): INR, PROTIME in the last 168 hours. Cardiac Enzymes: No results for input(s): CKTOTAL, CKMB, CKMBINDEX, TROPONINI in the last 168 hours. BNP (last 3 results) No results for input(s): PROBNP in the last 8760 hours. HbA1C: No results for input(s): HGBA1C in the last 72 hours. CBG: Recent Labs  Lab 11/13/19 2118 11/14/19 0904 11/14/19 1156 11/14/19 1306 11/14/19 1636  GLUCAP 226* 185* 146* 138* 143*   Lipid Profile: No results for input(s): CHOL, HDL, LDLCALC, TRIG, CHOLHDL, LDLDIRECT in the last 72 hours. Thyroid Function Tests: No results for input(s): TSH, T4TOTAL, FREET4, T3FREE, THYROIDAB in the last 72 hours. Anemia Panel: No results for input(s): VITAMINB12, FOLATE, FERRITIN, TIBC, IRON, RETICCTPCT in the last 72  hours. Urine analysis:    Component Value Date/Time   COLORURINE RED (A) 11/06/2019 1039   APPEARANCEUR HAZY (A) 11/06/2019 1039   LABSPEC 1.005 11/06/2019 1039   PHURINE  11/06/2019 1039    TEST NOT REPORTED DUE TO COLOR INTERFERENCE OF URINE PIGMENT   GLUCOSEU (A) 11/06/2019 1039    TEST NOT REPORTED DUE TO COLOR INTERFERENCE OF URINE PIGMENT   HGBUR (A) 11/06/2019 1039    TEST NOT REPORTED DUE TO COLOR INTERFERENCE OF URINE PIGMENT   BILIRUBINUR (A) 11/06/2019 1039    TEST NOT REPORTED DUE TO COLOR INTERFERENCE OF URINE PIGMENT   KETONESUR (A) 11/06/2019 1039    TEST NOT REPORTED DUE TO COLOR INTERFERENCE OF URINE PIGMENT   PROTEINUR (A) 11/06/2019 1039    TEST NOT REPORTED DUE TO COLOR INTERFERENCE OF URINE PIGMENT   NITRITE (A) 11/06/2019 1039    TEST NOT REPORTED DUE TO COLOR INTERFERENCE OF URINE PIGMENT   LEUKOCYTESUR (A) 11/06/2019 1039    TEST NOT REPORTED DUE  TO COLOR INTERFERENCE OF URINE PIGMENT   Sepsis Labs: @LABRCNTIP (procalcitonin:4,lacticidven:4)  ) Recent Results (from the past 240 hour(s))  Urine culture     Status: None   Collection Time: 11/06/19 10:39 AM   Specimen: Urine, Random  Result Value Ref Range Status   Specimen Description   Final    URINE, RANDOM Performed at [facility], [address] [place], [place] [address]    Special Requests   Final    NONE Performed at [facility], [address], [place], [place] [address]    Culture   Final    NO GROWTH Performed at [facility], [place] [address], [place], [place] [address]    Report Status 11/07/2019 FINAL  Final      Studies: CT BONE MARROW BIOPSY & ASPIRATION  Result Date: 11/14/2019 CLINICAL DATA:  Prostate carcinoma, monoclonal gammopathy EXAM: CT GUIDED DEEP ILIAC BONE ASPIRATION AND CORE BIOPSY TECHNIQUE: Patient was placed prone on the CT gantry and limited axial scans through the pelvis were obtained. Appropriate skin entry site was identified. Skin site was marked, prepped with chlorhexidine, draped in usual sterile fashion, and infiltrated locally with 1% lidocaine. Intravenous Fentanyl 61mg and Versed 0.581mwere administered as conscious sedation during continuous monitoring of the patient's level of consciousness and physiological / cardiorespiratory status by the radiology RN, with a total moderate sedation time of 7 minutes. Under CT fluoroscopic guidance an 11-gauge Cook trocar bone needle was advanced into the left iliac bone just lateral to the sacroiliac joint. Once needle tip position was confirmed, core and aspiration samples were obtained, submitted to pathology for approval. Post procedure scans show no hematoma or fracture. Patient tolerated procedure well. COMPLICATIONS: COMPLICATIONS none IMPRESSION: 1. Technically successful CT guided left iliac bone core and aspiration biopsy. Electronically Signed   By: D Lucrezia Europe.D.    On: 11/14/2019 14:14    Scheduled Meds: . [START ON 11/15/2019] abiraterone acetate  1,000 mg Oral Daily  . atorvastatin  80 mg Oral Daily  . carvedilol  25 mg Oral BID WC  . Chlorhexidine Gluconate Cloth  6 each Topical Daily  . DULoxetine  60 mg Oral Daily  . feeding supplement (ENSURE ENLIVE)  237 mL Oral TID WC & HS  . fentaNYL      . finasteride  5 mg Oral Daily  . heparin lock flush      . insulin aspart  0-15 Units Subcutaneous TID WC  .  insulin aspart  5 Units Subcutaneous TID WC  . midazolam      . multivitamin with minerals  1 tablet Oral Daily  . [START ON 11/15/2019] predniSONE  5 mg Oral QPC breakfast    Continuous Infusions: . sodium chloride irrigation 0 mL (11/05/19 0035)     LOS: 10 days     Enzo Bi, MD [facility]   11/14/2019, 9:26 PM

## 2019-11-14 NOTE — Evaluation (Signed)
Physical Therapy Evaluation Patient Details Name: [name] MRN: [id number] DOB: Dec 24, 1931 Today's Date: 11/14/2019   History of Present Illness  [name] is a 86 y.o. obese male lives at home with his daughter ambulate with a cane at baseline, with medical history significant for congestive heart failure, CKD 3, insulin-dependent type 2 diabetes, coronary artery disease, chronic anticoagulation secondary to history of DVT and history of frequent falls was brought to the emergency room thorugh EMS on 10/14/2019 because of generalized weakness, and multiple falls for the past few weeks. He was admitted to the hospital with diagnosis of acute on chronic diastolic dysfunction congestive heart failure, accidental fall with history of frequent falls, and elevated liver enzymes. Begin ambulation per cardiology note 10/17/2019.  Clinical Impression  Pt admitted with above diagnosis. Pt currently with functional limitations due to the deficits listed below (see "PT Problem List"). Upon entry, pt in bed, asleep and agreeable to participate. Pt quite drowsy and weak, but puts forth good effort. Heavy effort to move to EOB, pt attempts to rise from EOB with RW 5x,maximal effort but buttocks rises scantly less than 1 each at best. Pt refuses physical assist from author, but with significant bed elevation is able to rise unassisted and stand for less than 60sec. Pt too weak to attempt AMB. Pt not very conversational, does not provide much information for history, but sounds as though he didn't have an opportunity to work with PT but for a handful of times. Functional mobility assessment demonstrates increased effort/time requirements, poor tolerance, and need for physical assistance, whereas the patient performed these at a higher level of independence PTA. Pt would do well to return to STR and complete his rehab prior to returning home, as this would offer the best opportunity to regain his strength and  independence with mobility. Pt will benefit from skilled PT intervention to increase independence and safety with basic mobility in preparation for discharge to the venue listed below.       Follow Up Recommendations SNF;Supervision for mobility/OOB    Equipment Recommendations  Rolling walker with 5" wheels;3in1 (PT);Hospital bed;Wheelchair (measurements PT);Wheelchair cushion (measurements PT)    Recommendations for Other Services       Precautions / Restrictions Precautions Precautions: Fall Restrictions Weight Bearing Restrictions: No      Mobility  Bed Mobility Overal bed mobility: Needs Assistance Bed Mobility: Supine to Sit     Supine to sit: Min guard;Supervision     General bed mobility comments: Heavy effort required and additional time.  Transfers Overall transfer level: Needs assistance Equipment used: Rolling walker (2 wheeled) Transfers: Sit to/from Stand Sit to Stand: +2 physical assistance;Min guard;From elevated surface         General transfer comment: Pt tries multiple times from EOB, fairly weak, max effort, unsuccessful; Pt refuses physical assistance rising to standing, but author raises bed to 23" adn pt able to rise slowly but steadily with RW and remains up standing for ~45sec.  Ambulation/Gait Ambulation/Gait assistance: (unable at this time, pt quite weak)              Financial trader Rankin (Stroke Patients Only)       Balance Overall balance assessment: Needs assistance Sitting-balance support: No upper extremity supported;Feet supported;Single extremity supported Sitting balance-Leahy Scale: Fair     Standing balance support: During functional activity;Single extremity supported Standing balance-Leahy Scale: Fair  Pertinent Vitals/Pain Pain Assessment: No/denies pain    Home Living Family/patient expects to be discharged to:: Skilled  nursing facility Living Arrangements: Children Available Help at Discharge: Family;Personal care attendant Type of Home: House Home Access: Stairs to enter Entrance Stairs-Rails: Chemical engineer of Steps: 3 Home Layout: One level Home Equipment: Shower seat;Cane - single point;Bedside commode;Walker - 2 wheels Additional Comments: Pt states he has a shower chair and three canes. One cane is in the room is SPC, cannot describe the other two. Denies having walker, BSC, w/c, or hospital bed.    Prior Function Level of Independence: Independent with assistive device(s)         Comments: Pt is somewhat questionable historian. Presented from [employer], but home layout information provided appears to pertain to house that he shares with dtr-[name].     Hand Dominance        Extremity/Trunk Assessment   Upper Extremity Assessment Upper Extremity Assessment: Generalized weakness;Overall [facility] for tasks assessed    Lower Extremity Assessment Lower Extremity Assessment: Generalized weakness       Communication      Cognition                                              General Comments      Exercises     Assessment/Plan    PT Assessment Patient needs continued PT services  PT Problem List Decreased strength;Decreased mobility;Decreased safety awareness;Obesity;Decreased knowledge of precautions;Decreased activity tolerance;Decreased cognition;Cardiopulmonary status limiting activity;Decreased balance;Decreased knowledge of use of DME;Decreased skin integrity       PT Treatment Interventions DME instruction;Therapeutic activities;Cognitive remediation;Gait training;Therapeutic exercise;Patient/family education;Stair training;Balance training;Functional mobility training;Neuromuscular re-education    PT Goals (Current goals can be found in the Care Plan section)  Acute Rehab PT Goals Patient Stated Goal: feel better PT Goal  Formulation: With patient Time For Goal Achievement: 11/28/19 Potential to Achieve Goals: Good    Frequency Min 2X/week   Barriers to discharge Inaccessible home environment;Decreased caregiver support      Co-evaluation               AM-PAC PT "6 Clicks" Mobility  Outcome Measure Help needed turning from your back to your side while in a flat bed without using bedrails?: A Little Help needed moving from lying on your back to sitting on the side of a flat bed without using bedrails?: A Little Help needed moving to and from a bed to a chair (including a wheelchair)?: A Lot Help needed standing up from a chair using your arms (e.g., wheelchair or bedside chair)?: A Lot Help needed to walk in hospital room?: A Lot Help needed climbing 3-5 steps with a railing? : Total 6 Click Score: 13    End of Session   Activity Tolerance: Patient limited by fatigue;Patient limited by lethargy Patient left: with call bell/phone within reach;in bed;with bed alarm set Nurse Communication: Mobility status PT Visit Diagnosis: Unsteadiness on feet (R26.81);History of falling (Z91.81);Difficulty in walking, not elsewhere classified (R26.2);Muscle weakness (generalized) (M62.81)    Time: 6283-6629 PT Time Calculation (min) (ACUTE ONLY): 21 min   Charges:   PT Evaluation $PT Eval Moderate Complexity: 1 Mod          3:55 PM, 11/14/19 [name], PT, DPT Physical Therapist - [facility]  [phone] [facility])  [name] C 11/14/2019, 3:45 PM

## 2019-11-15 LAB — CBC
HCT: 25.3 % — ABNORMAL LOW (ref 39.0–52.0)
Hemoglobin: 7.8 g/dL — ABNORMAL LOW (ref 13.0–17.0)
MCH: 27 pg (ref 26.0–34.0)
MCHC: 30.8 g/dL (ref 30.0–36.0)
MCV: 87.5 fL (ref 80.0–100.0)
Platelets: 352 10*3/uL (ref 150–400)
RBC: 2.89 MIL/uL — ABNORMAL LOW (ref 4.22–5.81)
RDW: 15.1 % (ref 11.5–15.5)
WBC: 12.4 10*3/uL — ABNORMAL HIGH (ref 4.0–10.5)
nRBC: 0 % (ref 0.0–0.2)

## 2019-11-15 LAB — GLUCOSE, CAPILLARY
Glucose-Capillary: 187 mg/dL — ABNORMAL HIGH (ref 70–99)
Glucose-Capillary: 241 mg/dL — ABNORMAL HIGH (ref 70–99)
Glucose-Capillary: 275 mg/dL — ABNORMAL HIGH (ref 70–99)
Glucose-Capillary: 280 mg/dL — ABNORMAL HIGH (ref 70–99)

## 2019-11-15 LAB — BASIC METABOLIC PANEL
Anion gap: 6 (ref 5–15)
BUN: 27 mg/dL — ABNORMAL HIGH (ref 8–23)
CO2: 31 mmol/L (ref 22–32)
Calcium: 9.3 mg/dL (ref 8.9–10.3)
Chloride: 100 mmol/L (ref 98–111)
Creatinine, Ser: 1.52 mg/dL — ABNORMAL HIGH (ref 0.61–1.24)
GFR calc Af Amer: 48 mL/min — ABNORMAL LOW (ref >60)
GFR calc non Af Amer: 41 mL/min — ABNORMAL LOW (ref >60)
Glucose, Bld: 196 mg/dL — ABNORMAL HIGH (ref 70–99)
Potassium: 4.6 mmol/L (ref 3.5–5.1)
Sodium: 137 mmol/L (ref 135–145)

## 2019-11-15 LAB — MAGNESIUM: Magnesium: 2 mg/dL (ref 1.7–2.4)

## 2019-11-15 MED ORDER — ABIRATERONE ACETATE 250 MG PO TABS: 1000.0000 mg | ORAL_TABLET | Freq: Once | ORAL | Status: DC

## 2019-11-15 MED FILL — predniSONE 5 MG TABS: 5 | 30 days supply | Qty: 30 | Fill #0

## 2019-11-15 NOTE — Progress Notes (Signed)
[facility] Hematology/Oncology Progress Note  Date of admission: 11/04/2019  Hospital day:  11/15/2019   Assessment:  [name] is a 86 y.o. male with metastatic prostate cancer.  He presented with gross hematuria.  PSA  was 2,241.0 on 11/06/2019.   Renal stone CT on 11/05/2019 revealed confluent nodular masses adjacent to the prostate and distal sigmoid colon with left pelvic, perirectal, retroperitoneal, and mesenteric adenopathy as well as probable peritoneal disease most marked in the right upper quadrant extending about the right diaphragmatic leaflet.  There was pelvic and suspected vertebral osseous metastatic disease with L1 compression fracture, likely pathologic.  There was a possible 2 cm right lower lobe lung nodule.  There was left hydronephrosis and proximal ureterectasis secondary to pelvic process (prior renal ultrasound on 10/16/2019 revealed no hydronephrosis).  Chest CT on 11/08/2019 revealed right pleural effusion with pleural metastasis (2.7 x 2.1 cm nodularity along anterior right hemidiaphragm).  Osseous metastasis, including a dominant lesion involving the right-side of the L2 vertebral body.   Bone scan on 11/08/2019 revealed widespread bony metastasis (right occiput, clavicles, left upper extremity, sternum, ribs bilaterally, throughout the cervical, thoracic, lumbar spine, bony pelvis, proximal femurs, right tibia).  He received degarelix on 11/11/2019.  He began abiraterone on 11/14/2019.  He has a monoclonal gammopathy.  SPEP on 10/16/2019 revealed a 0.4 gm/dL + an additional 0.2 gm/dL biclonal IgA protein with kappa specificity.  Bone marrow was performed on 11/14/2019 (results pending).  The patient has a history of DVT approximately 3 months ago.  IVC filter was placed on 11/07/2019.  Plan:   1.   Metastatic prostatic prostate cancer             Patient has extensive pelvic, perirectal, retroperitoneal and mesenteric adenopathy.              Patient has extensive osseous metastasis.             Suspect pleural based nodularity is prostate cancer.             No currrent intervention for  L1 pathologic compression as patient asymptomatic.             Patient received degarelix on 11/11/2019.  Abiraterone and prednisone began on 11/14/2019.  Patient declined biopsy for NGS and future treatment options. 2.   Hematuria             Aspirin, Plavix, and Eliquis held.             Patient s/p IVC filter placement on 11/07/2019.            CBI discontinued.  Patient on a voiding trial. 3.   Normocytic anemia             Hematocrit 24.8.  Hemoglobin 7.8.  MCV 87.9 on 11/08/2019.              Hematocrit 25.6.  Hemoglobin 7.9.  MCV 87.4 on 11/10/2019.  Hematocrit 25.3.  Hemoglobin 7.8.  MCV 87.5 on 11/15/2019.  Hemoglobin remains stable.  Etiology likely secondary to acute blood loss, anemia of chronic renal disease, and possible monoclonal gammopathy.                         Patient is s/p 1 unit of PRBCs on 11/05/2019.             Maintain hemoglobin 7-8.             Ferritin 1454 (  acute phase reactant) with iron saturation 20% and TIBC 1389.  B12 and folate normal. 4.   Monoclonal gammopathy  SPEP on 10/16/2019 revealed a 0.4 gm/dL + an additional 0.2 gm/dL biclonal IgA protein with kappa specificity.  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   CRAB criteria- hypercalcemia, renal disease, anemia, bone lesions.    Calcium is normal.  Anemia is likely multi-factorial.  Creatinine 1.45-1.52.      Some bone lesions are blastic (typical for prostate cancer) and some lytic (more typical for myeloma).  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  Bone marrow aspirate and biopsy performed on 11/15/2019.   Results should be back in the next 7-10 days. 5.   Disposition  Anticipate follow-up in the medical oncology office next  week.  Please schedule follow-up appointment in the [facility] office [phone]).   Lequita Asal, MD  11/15/2019, 4:25 PM

## 2019-11-15 NOTE — TOC Progression Note (Signed)
Transition of Care [facility]) - Progression Note    Patient Details  Name: [name] MRN: [id number] Date of Birth: 05-01-1932  [facility] CM/SW Contact  Beverly Sessions, RN Phone Number: 11/15/2019, 3:33 PM  Clinical Narrative:     Spoke with daughter [name] via phone regarding discharge disposition PT has assessed patient and recommends SNF.   Initiation of treatment plan per oncology  Per daughter she will speak with patient tonight then RNCM to follow up tomorrow on their decisions        Expected Discharge Plan and Services                                                 Social Determinants of Health (SDOH) Interventions    Readmission Risk Interventions No flowsheet data found.

## 2019-11-15 NOTE — Progress Notes (Signed)
Occupational Therapy Treatment Patient Details Name: [name] MRN: [id number] DOB: 30-Jan-1932 Today's Date: 11/15/2019    History of present illness [name] is a 86 y.o. obese male lives at home with his daughter ambulate with a cane at baseline, with medical history significant for congestive heart failure, CKD 3, insulin-dependent type 2 diabetes, coronary artery disease, chronic anticoagulation secondary to history of DVT and history of frequent falls was brought to the emergency room thorugh EMS on 10/14/2019 because of generalized weakness, and multiple falls for the past few weeks. He was admitted to the hospital with diagnosis of acute on chronic diastolic dysfunction congestive heart failure, accidental fall with history of frequent falls, and elevated liver enzymes. Begin ambulation per cardiology note 10/17/2019.   OT comments  Mr. Chenier (goes by "Mr. Sid") seen for OT treatment on this date. Upon arrival to room pt awake/alert, speaking on the phone. Pt A&O to self, place, and situation, and denies pain aside form mild discomfort in his BLE this date. Pt agreeable to OT tx session. OT engages pt in BUE there-ex as described below. Pt return demonstrates understanding of exercises and regularly does additional repetitions of each exercise. Requires minimal to moderate cueing for technique and breathing strategies during bed level exercise. Pt requesting to apply lotion to BLE at end of session. OT provides max assist to doff/don hospital socks and help pt with this grooming task. Pt requires encouragement for active participation in the task, but generally puts forth good effort t/o session. Pt making good progress toward goals and continues to benefit from skilled OT services to maximize return to PLOF and minimize risk of future falls, injury, caregiver burden, and readmission. Will continue to follow POC as written. Discharge recommendation remains appropriate.    Follow Up  Recommendations  SNF    Equipment Recommendations  Other (comment)(TBD)    Recommendations for Other Services      Precautions / Restrictions Precautions Precautions: Fall Restrictions Weight Bearing Restrictions: No       Mobility Bed Mobility Overal bed mobility: Needs Assistance                Transfers                      Balance Overall balance assessment: Needs assistance                                         ADL either performed or assessed with clinical judgement   ADL       Grooming: Bed level;Sitting;Minimal assistance;Set up;Supervision/safety                                 General ADL Comments: Pt requesting assistance with application of lotion to BLE this date. Requires max assist to apply bilat lotion on feet/lower legs with pt able to raise legs and assist with positioning while OT assists doffing/donning hospital socks and with application of lotion.     Vision Patient Visual Report: No change from baseline     Perception     Praxis      Cognition Arousal/Alertness: Awake/alert Behavior During Therapy: WFL for tasks assessed/performed;Flat affect Overall Cognitive Status: No family/caregiver present to determine baseline cognitive functioning  Following Commands: Follows one step commands with increased time       General Comments: Pt oriented to self, place, and situation. Responses continue to be slowed with pt intermittently closing eyes during session, but generally able to participate in therapy this date.        Exercises General Exercises - Upper Extremity Shoulder Flexion: AROM;Both;5 reps;Supine Elbow Flexion: AROM;Both;10 reps;15 reps Elbow Extension: AROM;Both;10 reps;15 reps Wrist Flexion: AROM;Both;10 reps Wrist Extension: AROM;Both;10 reps Digit Composite Flexion: AROM;Both;10 reps Composite Extension: AROM;Both;10 reps Other  Exercises Other Exercises: OT engages pt in BUE therapeutic exercises as described below. Requires Min cueing throughout for breathing and technique. Pt eager to participate at typically does greater than requested number of reps for each exercise, but fatigues quickly and tolerates at most 2 sets.   Shoulder Instructions       General Comments      Pertinent Vitals/ Pain       Pain Assessment: No/denies pain  Home Living                                          Prior Functioning/Environment              Frequency  Min 1X/week        Progress Toward Goals  OT Goals(current goals can now be found in the care plan section)  Progress towards OT goals: Progressing toward goals  Acute Rehab OT Goals Patient Stated Goal: feel better OT Goal Formulation: With patient Time For Goal Achievement: 11/24/19 Potential to Achieve Goals: Good  Plan      Co-evaluation                 AM-PAC OT "6 Clicks" Daily Activity     Outcome Measure   Help from another person eating meals?: A Little Help from another person taking care of personal grooming?: A Little Help from another person toileting, which includes using toliet, bedpan, or urinal?: Total Help from another person bathing (including washing, rinsing, drying)?: A Lot Help from another person to put on and taking off regular upper body clothing?: A Lot Help from another person to put on and taking off regular lower body clothing?: A Lot 6 Click Score: 13    End of Session    OT Visit Diagnosis: Muscle weakness (generalized) (M62.81);Other abnormalities of gait and mobility (R26.89)   Activity Tolerance Patient limited by fatigue;Patient tolerated treatment well   Patient Left in bed;with call bell/phone within reach;with bed alarm set   Nurse Communication          Time: 0240-9735 OT Time Calculation (min): 29 min  Charges: OT General Charges $OT Visit: 1 Visit OT Treatments $Self  Care/Home Management : 8-22 mins $Therapeutic Exercise: 8-22 mins  Shara Blazing, M.S., OTR/L Ascom: [phone] 11/15/19, 1:02 PM

## 2019-11-15 NOTE — Progress Notes (Signed)
PROGRESS NOTE  [name] GBT:[id number] DOB: [birth year] DOA: 11/04/2019 PCP: [name], PA-C  HPI/Recap of past 24 hours: Patient is an 86 year old AA male with past medical history of CAD status post stent, chronic diastolic heart failure, sleep apnea, diabetes mellitus, stage III chronic kidney disease and recent DVT diagnosed 3 months ago placed on Eliquis who presented with hematuria.  Per his assisted living facility, patient had been reportedly having hematuria for the past 5 days.  In the emergency room, patient was noted to have a white count of 11.9 and a hemoglobin of 8.3, down from 9.52 weeks prior.  He was noted to have gross hematuria with clots on bladder scan noted a third of a liter.  Patient was started on continuous bladder irrigation and urology was consulted.  Urology was able to irrigate the bladder with about a liter of normal saline and then the patient was set up for continuous bladder irrigation.  He was also transfused 1 unit packed red blood cells.  His Eliquis was held.  Following transfusion, patient's hemoglobin improved to 8.6, and by this afternoon, down to 7.9.  In discussion with urology, they suspect that he has metastatic prostate cancer.  CT scan of abdomen pelvis notes multiple areas of metastases.  PSA came back on 1/18 greater than 2000.  Oncology consulted & and have ordered CT scan of chest which is pending as well as bone scan which shows diffuse bone metastases.  Vascular surgery placed IVC filter on 1/18.  Orthopedics consulted for possible vertebroplasty at L1 site for pathologic fracture.   Subjective/interval history: Foley was removed yesterday, and pt had been voiding on his own.  Pt reported dysuria improved.  No fever, dyspnea, chest pain, abdominal pain, N/V/D, increased swelling.   Assessment/Plan: Principal Problem:    Hematuria and acute blood loss anemia in patient with prostate cancer  --Anticoagulation discontinued.  Being  followed by urology.  Hemoglobin remaining stable for now. --bladder irrigation turned off today --Remove foley on 1/25 and started voiding trial with bladder scan q4h.  So far, pt has been voiding on his own. --If PVD >500 ml, please call oncall urology or hospitalist.  No I/O cath by nursing due to friable prostate.    CAD (coronary artery disease) stable.    Insulin dependent type 2 diabetes mellitus ([place]): On sliding scale.    Essential hypertension: Blood pressures have been stable to slightly on the high end.  Continue home coreg.    CKD (chronic kidney disease) stage 3a, GFR 30-59 ml/min: Stable.,  At baseline.    Chronic diastolic CHF (congestive heart failure) [facility]): Monitor closely.  Given blood and IVC fluid, likely could become hypervolemic easily.  --Diurese PRN --continue home coreg    Elevated LFTs, resolved  Metastatic prostate cancer with widespread metastases to bone as well as liver:  --PSA greater than 2000.  Oncology discussing treatment options.   The patient's wife passed away in the last year from pancreatic cancer.  Discussed with daughter.  Decided to go forward with treatment with degarelix and abiraterone. --treatment per Oncology  Pathologic L1 fracture minimally symptomatic --Ortho consulted, and rec observation    OSA on CPAP: Nightly CPAP.   Dyslipidemia: Continue statin.    Morbid obesity ([place]): Meets criteria for BMI greater than 35+ diabetes and hypertension and heart failure.    DVT (deep venous thrombosis) ([place]): Diagnosed 3 months ago.  Can no longer be on anticoagulation.  Vascular surgery placed IVC filter on  1/18  Normocytic anemia --Ferritin 1454 (acute phase reactant) with iron saturation 20% and TIBC 1389.  B12 and folate normal. --s/p 1u pRBC --Maintain hemoglobin 7-8.  Code Status: Full code --palliative care was consulted during this hospitalization  Family Communication: daughter updated at the bedside Disposition Plan: PT rec  SNF.  Can discharge if no more urological issues (passes voiding trial, no more hematuria, Hgb stable and urology clears), and a SNF bed is found.   Consultants:  Urology  Vascular surgery  Oncology  Orthopedics  Procedures:  Bladder irrigation started 1/15  IVC filter placement 1/18  Antimicrobials:  None  DVT prophylaxis: SCDs   Objective: Vitals:   11/14/19 2021 11/15/19 0435  BP: 136/61 133/67  Pulse: 62 67  Resp: 16 20  Temp: 97.9 F (36.6 C) 97.6 F (36.4 C)  SpO2: 99% 100%    Intake/Output Summary (Last 24 hours) at 11/15/2019 1426 Last data filed at 11/15/2019 0434 Gross per 24 hour  Intake -  Output 850 ml  Net -850 ml   Filed Weights   11/04/19 1241 11/05/19 0135  Weight: 123.4 kg 115.4 kg   Body mass index is 35.48 kg/m.  Exam: Constitutional: NAD, AAOx3, a bit more interactive today, in bed watching TV HEENT: conjunctivae and lids normal, EOMI CV: RRR no M,R,G. Distal pulses +2.  No cyanosis.   RESP: CTA B/L, normal respiratory effort  GI: +BS, NTND Extremities: No effusions, edema, or tenderness in BLE SKIN: warm.  Skin over both lower legs have severe venous stasis changes, dry, leathery, with grooves, however, much better looking after daily moisturization  Neuro: II - XII grossly intact.  Sensation intact Condom cath putting out pink urine    Data Reviewed: CBC: Recent Labs  Lab 11/11/19 0535 11/12/19 0434 11/13/19 0859 11/14/19 0649 11/15/19 0510  WBC 10.5 13.1* 13.4* 13.0*  12.4* 12.4*  NEUTROABS  --   --   --  10.4*  --   HGB 7.8* 7.9* 7.9* 7.8*  7.9* 7.8*  HCT 24.7* 25.7* 25.6* 24.7*  25.1* 25.3*  MCV 87.6 88.3 87.4 87.3  86.9 87.5  PLT 304 322 317 373  335 992   Basic Metabolic Panel: Recent Labs  Lab 11/11/19 0535 11/12/19 0434 11/13/19 0859 11/14/19 0649 11/15/19 0510  NA 140 140 137 138 137  K 4.4 4.4 4.3 4.3 4.6  CL 107 105 102 102 100  CO2 23 25 26 25 31   GLUCOSE 160* 183* 202* 185* 196*  BUN 16  20 22  29* 27*  CREATININE 1.54* 1.63* 1.57* 1.61* 1.52*  CALCIUM 9.3 9.3 9.2 9.3 9.3  MG 1.9 1.9 2.1 2.1 2.0   GFR: Estimated Creatinine Clearance: 45.9 mL/min (A) (by C-G formula based on SCr of 1.52 mg/dL (H)). Liver Function Tests: No results for input(s): AST, ALT, ALKPHOS, BILITOT, PROT, ALBUMIN in the last 168 hours. No results for input(s): LIPASE, AMYLASE in the last 168 hours. No results for input(s): AMMONIA in the last 168 hours. Coagulation Profile: No results for input(s): INR, PROTIME in the last 168 hours. Cardiac Enzymes: No results for input(s): CKTOTAL, CKMB, CKMBINDEX, TROPONINI in the last 168 hours. BNP (last 3 results) No results for input(s): PROBNP in the last 8760 hours. HbA1C: No results for input(s): HGBA1C in the last 72 hours. CBG: Recent Labs  Lab 11/14/19 1306 11/14/19 1636 11/14/19 2106 11/15/19 0829 11/15/19 1121  GLUCAP 138* 143* 183* 187* 241*   Lipid Profile: No results for input(s): CHOL, HDL, LDLCALC, TRIG,  CHOLHDL, LDLDIRECT in the last 72 hours. Thyroid Function Tests: No results for input(s): TSH, T4TOTAL, FREET4, T3FREE, THYROIDAB in the last 72 hours. Anemia Panel: No results for input(s): VITAMINB12, FOLATE, FERRITIN, TIBC, IRON, RETICCTPCT in the last 72 hours. Urine analysis:    Component Value Date/Time   COLORURINE RED (A) 11/06/2019 1039   APPEARANCEUR HAZY (A) 11/06/2019 1039   LABSPEC 1.005 11/06/2019 1039   PHURINE  11/06/2019 1039    TEST NOT REPORTED DUE TO COLOR INTERFERENCE OF URINE PIGMENT   GLUCOSEU (A) 11/06/2019 1039    TEST NOT REPORTED DUE TO COLOR INTERFERENCE OF URINE PIGMENT   HGBUR (A) 11/06/2019 1039    TEST NOT REPORTED DUE TO COLOR INTERFERENCE OF URINE PIGMENT   BILIRUBINUR (A) 11/06/2019 1039    TEST NOT REPORTED DUE TO COLOR INTERFERENCE OF URINE PIGMENT   KETONESUR (A) 11/06/2019 1039    TEST NOT REPORTED DUE TO COLOR INTERFERENCE OF URINE PIGMENT   PROTEINUR (A) 11/06/2019 1039    TEST NOT  REPORTED DUE TO COLOR INTERFERENCE OF URINE PIGMENT   NITRITE (A) 11/06/2019 1039    TEST NOT REPORTED DUE TO COLOR INTERFERENCE OF URINE PIGMENT   LEUKOCYTESUR (A) 11/06/2019 1039    TEST NOT REPORTED DUE TO COLOR INTERFERENCE OF URINE PIGMENT   Sepsis Labs: @LABRCNTIP (procalcitonin:4,lacticidven:4)  ) Recent Results (from the past 240 hour(s))  Urine culture     Status: None   Collection Time: 11/06/19 10:39 AM   Specimen: Urine, Random  Result Value Ref Range Status   Specimen Description   Final    URINE, RANDOM Performed at [facility], [address] [place], [place] [address]    Special Requests   Final    NONE Performed at [facility], [address], [place], [place] [address]    Culture   Final    NO GROWTH Performed at [facility], [place] [address] [place],  [address]    Report Status 11/07/2019 FINAL  Final      Studies: No results found.  Scheduled Meds: . abiraterone acetate  1,000 mg Oral Daily  . atorvastatin  80 mg Oral Daily  . carvedilol  25 mg Oral BID WC  . Chlorhexidine Gluconate Cloth  6 each Topical Daily  . DULoxetine  60 mg Oral Daily  . feeding supplement (ENSURE ENLIVE)  237 mL Oral TID WC & HS  . finasteride  5 mg Oral Daily  . insulin aspart  0-15 Units Subcutaneous TID WC  . insulin aspart  5 Units Subcutaneous TID WC  . multivitamin with minerals  1 tablet Oral Daily  . predniSONE  5 mg Oral QPC breakfast    Continuous Infusions:    LOS: 11 days     Enzo Bi, MD [facility]   11/15/2019, 2:26 PM

## 2019-11-15 NOTE — Progress Notes (Addendum)
Urology Inpatient Progress Note  Subjective: [name] is a 86 y.o. male admitted on 11/04/2019 with gross hematuria x5 days on multiple anticoagulants, now holding anticoagulation and s/p IVC filter placement.  PSA >2000 with CT findings suggestive of metastatic prostate cancer, he received degarelix on 11/11/2019 with and abiraterone on 11/14/2019.  Hemoglobin stable, 7.8.  Creatinine stable, 1.52.  CBI discontinued and Foley catheter removed for voiding trial yesterday.  Follow-up bladder scan was 64 mL. Condom cath applied with pink urinary output.  Anti-infectives: Anti-infectives (From admission, onward)   Start     Dose/Rate Route Frequency Ordered Stop   11/08/19 0000  ceFAZolin (ANCEF) IVPB 1 g/50 mL premix    Note to Pharmacy: To be given in specials   1 g 100 mL/hr over 30 Minutes Intravenous  Once 11/07/19 1428 11/07/19 1723      Current Facility-Administered Medications  Medication Dose Route Frequency Provider Last Rate Last Admin  . abiraterone acetate (ZYTIGA) tablet 1,000 mg  1,000 mg Oral Daily Corcoran, Melissa C, MD      . atorvastatin (LIPITOR) tablet 80 mg  80 mg Oral Daily Algernon Huxley, MD   80 mg at 11/14/19 1804  . carvedilol (COREG) tablet 25 mg  25 mg Oral BID WC Algernon Huxley, MD   25 mg at 11/15/19 0951  . Chlorhexidine Gluconate Cloth 2 % PADS 6 each  6 each Topical Daily Algernon Huxley, MD   6 each at 11/15/19 [phone]  . DULoxetine (CYMBALTA) DR capsule 60 mg  60 mg Oral Daily Algernon Huxley, MD   60 mg at 11/15/19 0951  . feeding supplement (ENSURE ENLIVE) (ENSURE ENLIVE) liquid 237 mL  237 mL Oral TID WC & HS Enzo Bi, MD   237 mL at 11/15/19 0956  . finasteride (PROSCAR) tablet 5 mg  5 mg Oral Daily Algernon Huxley, MD   5 mg at 11/15/19 0951  . HYDROcodone-acetaminophen (NORCO/VICODIN) 5-325 MG per tablet 1-2 tablet  1-2 tablet Oral Q4H PRN Arne Cleveland, MD      . insulin aspart (novoLOG) injection 0-15 Units  0-15 Units Subcutaneous TID WC Enzo Bi, MD    3 Units at 11/15/19 0950  . insulin aspart (novoLOG) injection 5 Units  5 Units Subcutaneous TID WC Enzo Bi, MD   5 Units at 11/15/19 0950  . multivitamin with minerals tablet 1 tablet  1 tablet Oral Daily Enzo Bi, MD   1 tablet at 11/15/19 0951  . ondansetron (ZOFRAN) injection 4 mg  4 mg Intravenous Q6H PRN Algernon Huxley, MD   4 mg at 11/13/19 1810  . opium-belladonna (B&O SUPPRETTES) 16.2-60 MG suppository 1 suppository  1 suppository Rectal Q8H PRN Algernon Huxley, MD      . oxyCODONE-acetaminophen (PERCOCET/ROXICET) 5-325 MG per tablet 1 tablet  1 tablet Oral Q8H PRN Basilio Cairo, NP   1 tablet at 11/10/19 1540  . predniSONE (DELTASONE) tablet 5 mg  5 mg Oral QPC breakfast Nolon Stalls C, MD   5 mg at 11/15/19 0951   Objective: Vital signs in last 24 hours: Temp:  [97.6 F (36.4 C)-97.9 F (36.6 C)] 97.6 F (36.4 C) (01/26 0435) Pulse Rate:  [62-73] 67 (01/26 0435) Resp:  [9-20] 20 (01/26 0435) BP: (107-141)/(58-86) 133/67 (01/26 0435) SpO2:  [96 %-100 %] 100 % (01/26 0435)  Intake/Output from previous day: 01/25 0701 - 01/26 0700 In: -  Out: 1600 [Urine:1600] Intake/Output this shift: No intake/output data recorded.  Physical Exam Vitals and nursing note reviewed.  Constitutional:      General: He is not in acute distress.    Appearance: He is not ill-appearing, toxic-appearing or diaphoretic.  HENT:     Head: Normocephalic and atraumatic.  Pulmonary:     Effort: Pulmonary effort is normal. No respiratory distress.  Abdominal:     General: There is no distension.     Palpations: Abdomen is soft.     Tenderness: There is no abdominal tenderness. There is no guarding or rebound.  Skin:    General: Skin is warm and dry.  Neurological:     Mental Status: He is alert. Mental status is at baseline.    Lab Results:  Recent Labs    11/14/19 0649 11/15/19 0510  WBC 13.0*  12.4* 12.4*  HGB 7.8*  7.9* 7.8*  HCT 24.7*  25.1* 25.3*  PLT 373  335 352    BMET Recent Labs    11/14/19 0649 11/15/19 0510  NA 138 137  K 4.3 4.6  CL 102 100  CO2 25 31  GLUCOSE 185* 196*  BUN 29* 27*  CREATININE 1.61* 1.52*  CALCIUM 9.3 9.3   Assessment & Plan: 86 year old male admitted with gross hematuria on multiple anticoagulants with new findings suggestive of metastatic prostate cancer.  Holding anticoagulation, s/p IVC filter placement, started on degarelix and abiraterone.  CBI discontinued and Foley catheter removed, hemoglobin and creatinine stable following voiding trial yesterday.  Recommend repeat bladder scan today to ensure adequate bladder emptying.  Debroah Loop, PA-C 11/15/2019

## 2019-11-15 NOTE — Progress Notes (Signed)
PT Cancellation Note  Patient Details Name: [name] MRN: [id number] DOB: April 24, 1932   Cancelled Treatment:    Reason Eval/Treat Not Completed: Fatigue/lethargy limiting ability to participate(Treatment attempted. Pt reports excessive lethargy precluding participating at this time. Pt given encouragement but not persuaded. Pt/author decide that tomorrow in morning may be a better time for alertness. Will attempt again later date/)  3:19 PM, 11/15/19 [name], PT, DPT Physical Therapist - [facility]  [phone] ([place])    [place] [facility] 11/15/2019, 3:19 PM

## 2019-11-16 DIAGNOSIS — Z515 Encounter for palliative care: Secondary | ICD-10-CM

## 2019-11-16 LAB — CBC
HCT: 24.1 % — ABNORMAL LOW (ref 39.0–52.0)
Hemoglobin: 7.6 g/dL — ABNORMAL LOW (ref 13.0–17.0)
MCH: 27.1 pg (ref 26.0–34.0)
MCHC: 31.5 g/dL (ref 30.0–36.0)
MCV: 86.1 fL (ref 80.0–100.0)
Platelets: 383 10*3/uL (ref 150–400)
RBC: 2.8 MIL/uL — ABNORMAL LOW (ref 4.22–5.81)
RDW: 15.1 % (ref 11.5–15.5)
WBC: 13.7 10*3/uL — ABNORMAL HIGH (ref 4.0–10.5)
nRBC: 0 % (ref 0.0–0.2)

## 2019-11-16 LAB — BASIC METABOLIC PANEL
Anion gap: 10 (ref 5–15)
BUN: 27 mg/dL — ABNORMAL HIGH (ref 8–23)
CO2: 27 mmol/L (ref 22–32)
Calcium: 9.1 mg/dL (ref 8.9–10.3)
Chloride: 102 mmol/L (ref 98–111)
Creatinine, Ser: 1.35 mg/dL — ABNORMAL HIGH (ref 0.61–1.24)
GFR calc Af Amer: 55 mL/min — ABNORMAL LOW (ref >60)
GFR calc non Af Amer: 48 mL/min — ABNORMAL LOW (ref >60)
Glucose, Bld: 213 mg/dL — ABNORMAL HIGH (ref 70–99)
Potassium: 4.3 mmol/L (ref 3.5–5.1)
Sodium: 139 mmol/L (ref 135–145)

## 2019-11-16 LAB — GLUCOSE, CAPILLARY
Glucose-Capillary: 195 mg/dL — ABNORMAL HIGH (ref 70–99)
Glucose-Capillary: 335 mg/dL — ABNORMAL HIGH (ref 70–99)
Glucose-Capillary: 265 mg/dL — ABNORMAL HIGH (ref 70–99)
Glucose-Capillary: 333 mg/dL — ABNORMAL HIGH (ref 70–99)

## 2019-11-16 LAB — SARS CORONAVIRUS 2 (TAT 6-24 HRS): SARS Coronavirus 2: NEGATIVE

## 2019-11-16 LAB — SURGICAL PATHOLOGY

## 2019-11-16 LAB — MAGNESIUM: Magnesium: 2.1 mg/dL (ref 1.7–2.4)

## 2019-11-16 MED ORDER — INSULIN ASPART 100 UNIT/ML ~~LOC~~ SOLN
0.0000 [IU] | Freq: Three times a day (TID) | SUBCUTANEOUS | Status: DC
  Administered 2019-11-17: 5 [IU] via SUBCUTANEOUS
  Administered 2019-11-17: 8 [IU] via SUBCUTANEOUS
  Filled 2019-11-16 (×2): qty 1

## 2019-11-16 NOTE — Progress Notes (Signed)
Physical Therapy Treatment Patient Details Name: [name] MRN: [id number] DOB: [birth year] Today's Date: 11/16/2019    History of Present Illness [name] is a 86 y.o. obese male lives at home with his daughter ambulate with a cane at baseline, with medical history significant for congestive heart failure, CKD 3, insulin-dependent type 2 diabetes, coronary artery disease, chronic anticoagulation secondary to history of DVT and history of frequent falls was brought to the emergency room thorugh EMS on 10/14/2019 because of generalized weakness, and multiple falls for the past few weeks. He was admitted to the hospital with diagnosis of acute on chronic diastolic dysfunction congestive heart failure, accidental fall with history of frequent falls, and elevated liver enzymes. Begin ambulation per cardiology note 10/17/2019.    PT Comments    [name] in bed upon entry, remembers author from previous visits, similarly lethargic and somnolent as yesterday. Pt agreeable to PT. 2-3 minutes to move to EOB, appears very weak. Pt able to sit unsupported but slumps to right frequently. Pt attempts to stand multiple times minA with RW and ModA without, stands almost 2 minutes cumulatively, but never able to correct posterior lean and imbalance. Pt assisted to recliner and participates in some leg exercises, but ROM limited by weakness. Pt agreeable to stay up in chair through lunch time.    Follow Up Recommendations  SNF;Supervision for mobility/OOB     Equipment Recommendations       Recommendations for Other Services OT consult     Precautions / Restrictions Precautions Precautions: Fall    Mobility  Bed Mobility Overal bed mobility: Needs Assistance Bed Mobility: Supine to Sit     Supine to sit: Min guard;Min assist     General bed mobility comments: max effort required 2-3 minutes, pt pausing to rest frequentlky  Transfers Overall transfer level: Needs assistance Equipment  used: Rolling walker (2 wheeled);None Transfers: Sit to/from [facility] Sit to Stand: From elevated surface;Min assist;+2 safety/equipment Stand pivot transfers: Mod assist;+2 safety/equipment       General transfer comment: struggles to achive standing balance this date.  Ambulation/Gait Ambulation/Gait assistance: (unable this date)               Marine scientist [place]in (Stroke Patients Only)       Balance     Sitting balance-Leahy Scale: Zero                                      Cognition Arousal/Alertness: Lethargic Behavior During Therapy: WFL for tasks assessed/performed;Flat affect Overall Cognitive Status: No family/caregiver present to determine baseline cognitive functioning                         Following Commands: Follows one step commands with increased time       General Comments: Pt oriented to self, place, and situation. Responses continue to be slowed with pt intermittently closing eyes during session, but generally able to participate in therapy this date.      Exercises General Exercises - Lower Extremity Long Arc Quad: AROM;Both;10 reps;Seated Hip Flexion/Marching: AROM;Both;10 reps;Seated    General Comments        Pertinent Vitals/Pain Pain Assessment: No/denies pain    Home Living  Prior Function            PT Goals (current goals can now be found in the care plan section) Acute Rehab PT Goals Patient Stated Goal: feel better PT Goal Formulation: With patient Time For Goal Achievement: 11/28/19 Potential to Achieve Goals: Fair Progress towards PT goals: Not progressing toward goals - comment    Frequency    Min 2X/week      PT Plan Current plan remains appropriate    Co-evaluation              AM-PAC PT "6 Clicks" Mobility   Outcome Measure  Help needed turning from your back to your side  while in a flat bed without using bedrails?: A Little Help needed moving from lying on your back to sitting on the side of a flat bed without using bedrails?: A Little Help needed moving to and from a bed to a chair (including a wheelchair)?: A Lot Help needed standing up from a chair using your arms (e.g., wheelchair or bedside chair)?: A Lot Help needed to walk in hospital room?: Total Help needed climbing 3-5 steps with a railing? : Total 6 Click Score: 12    End of Session Equipment Utilized During Treatment: Gait belt Activity Tolerance: Patient limited by fatigue;Patient limited by lethargy Patient left: in chair;with chair alarm set;with call bell/phone within reach Nurse Communication: Mobility status PT Visit Diagnosis: Unsteadiness on feet (R26.81);History of falling (Z91.81);Difficulty in walking, not elsewhere classified (R26.2);Muscle weakness (generalized) (M62.81)     Time: 1856-3149 PT Time Calculation (min) (ACUTE ONLY): 28 min  Charges:  $Therapeutic Exercise: 8-22 mins $Therapeutic Activity: 8-22 mins                     1:16 PM, 11/16/19 [name], PT, DPT Physical Therapist - [facility]  [phone] ([place])    [name] C 11/16/2019, 1:13 PM

## 2019-11-16 NOTE — Progress Notes (Addendum)
[place] [facility] [place] NAME: [name]    MR#:  [id number]  DATE OF BIRTH:  1932-03-29  SUBJECTIVE:   Patient resting quietly. No complaints. No new issues per RN. REVIEW OF SYSTEMS:   Review of Systems  Constitutional: Positive for malaise/fatigue. Negative for chills, fever and weight loss.  HENT: Negative for ear discharge, ear pain and nosebleeds.   Eyes: Negative for blurred vision, pain and discharge.  Respiratory: Negative for sputum production, shortness of breath, wheezing and stridor.   Cardiovascular: Negative for chest pain, palpitations, orthopnea and PND.  Gastrointestinal: Negative for abdominal pain, diarrhea, nausea and vomiting.  Genitourinary: Negative for frequency and urgency.  Musculoskeletal: Negative for back pain and joint pain.  Neurological: Positive for weakness. Negative for sensory change, speech change and focal weakness.  Psychiatric/Behavioral: Negative for depression and hallucinations. The patient is not nervous/anxious.    Tolerating Diet:yes Tolerating PT: rehab  DRUG ALLERGIES:   Allergies  Allergen Reactions  . Metformin And Related Diarrhea    Intolerance due to naturally loose bowels    VITALS:  Blood pressure (!) 141/63, pulse 70, temperature (!) 97.3 F (36.3 C), temperature source Oral, resp. rate 18, height 5\' 11"  (1.803 m), weight 115.4 kg, SpO2 97 %.  PHYSICAL EXAMINATION:   Physical Exam  GENERAL:  86 y.o.-year-old patient lying in the bed with no acute distress.  EYES: Pupils equal, round, reactive to light and accommodation. No scleral icterus.   HEENT: Head atraumatic, normocephalic. Oropharynx and nasopharynx clear.  NECK:  Supple, no jugular venous distention. No thyroid enlargement, no tenderness.  LUNGS: Normal breath sounds bilaterally, no wheezing, rales, rhonchi. No use of accessory muscles of respiration.  CARDIOVASCULAR: S1, S2 normal. No murmurs, rubs, or gallops.   ABDOMEN: Soft, nontender, nondistended. Bowel sounds present. No organomegaly or mass.  Condom cath --pinkish urine EXTREMITIES:skin over both lower legs have severe venous stasis changes  NEUROLOGIC: Cranial nerves II through XII are intact. No focal Motor or sensory deficits b/l.   PSYCHIATRIC:  patient is alert and awake.  SKIN: No obvious rash, lesion, or ulcer.   LABORATORY PANEL:  CBC Recent Labs  Lab 11/16/19 0727  WBC 13.7*  HGB 7.6*  HCT 24.1*  PLT 383    Chemistries  Recent Labs  Lab 11/16/19 0727  NA 139  K 4.3  CL 102  CO2 27  GLUCOSE 213*  BUN 27*  CREATININE 1.35*  CALCIUM 9.1  MG 2.1   Cardiac Enzymes No results for input(s): TROPONINI in the last 168 hours. RADIOLOGY:  No results found. ASSESSMENT AND PLAN:  86 year old AA male with past medical history of CAD status post stent, chronic diastolic heart failure, sleep apnea, diabetes mellitus, stage III chronic kidney disease and recent DVT diagnosed 3 months ago placed on Eliquis who presented with hematuria.  Hematuria and acute blood loss anemia in patient with prostate cancer  --Anticoagulation discontinued.  Being followed by urology.  Hemoglobin remaining stable for now. --bladder irrigation turned off --Remove foley on 1/25 and started voiding trial with bladder scan q4h.  So far, pt has been voiding on his own. --If PVD >500 ml, please call oncall urology  - No I/O cath by nursing due to friable prostate  Metastatic prostate cancer with widespread metastases to bone as well as liver:  --PSA greater than 2000.   -Oncology Dr Alvia Grove  -The patient's wife passed away in the last year from pancreatic cancer.  Discussed  with daughter.  Decided to go forward with treatment with degarelix (shots 3 weekly) and abiraterone and prednisone (Oral daily)  Pathologic L1 fracture minimally symptomatic --Ortho consulted, and rec observation  CAD (coronary artery disease) stable.  Insulin dependent  type 2 diabetes mellitus ([name]): On sliding scale.  Essential hypertension:  -Blood pressures have been stable to slightly on the high end.   -Continue home coreg.  CKD (chronic kidney disease) stage 3a, GFR 30-59 ml/min: - Stable,At baseline.   Chronic diastolic CHF (congestive heart failure) ([place]):  - Given blood and IVC fluid, likely could become hypervolemic easily.  --Diurese PRN --continue home coreg  OSA on CPAP: Nightly CPAP.  Dyslipidemia: Continue statin.  DVT (deep venous thrombosis) ([place]): Diagnosed 3 months ago.  Can no longer be on anticoagulation due to significant hematuria - Vascular surgery placed IVC filter on 1/18  Normocytic anemia --Ferritin 1454 (acute phase reactant) with iron saturation 20% and TIBC 1389.  B12 and folate normal. --s/p 1u pRBC --Maintain hemoglobin 7-8.  --palliative care was consulted during this hospitalization  Procedures: CBI, IVC filter Family communication : daughter [name] on the phone Consults : urology, vascular, oncology, orthopedics Discharge Disposition : rehab likely tomorrow 1/28 CODE STATUS: full DVT Prophylaxis : SCD Barriers to discharge: none  TOTAL TIME TAKING CARE OF THIS PATIENT: *30* minutes.  >50% time spent on counselling and coordination of care  Note: This dictation was prepared with Dragon dictation along with smaller phrase technology. Any transcriptional errors that result from this process are unintentional.  Fritzi Mandes M.D    [facility]   CC: Primary care physician; [name], PA-CPatient ID: [name], male   DOB: December 19, 1931, 86 y.o.   MRN: [id number]

## 2019-11-16 NOTE — Progress Notes (Addendum)
Urology Inpatient Progress Note  Subjective: [name] is a 86 y.o. male admitted on 11/04/2019 with gross hematuria x5 days and multiple anticoagulants, now holding anticoagulation and s/p IVC filter placement.  PSA >2000 with CT findings suggestive of metastatic prostate cancer, he received degarelix on 11/11/2019 and abiraterone on 11/14/2019.   Hemoglobin slightly down today, 7.6.  Creatinine down today, 1.35.  Condom cath removed and external urine management system in place draining yellow urine.  He reports that he has urinated this morning and does not need to urinate now.  RN performed bladder scan at the bedside with PVR ~50 mL.  Anti-infectives: Anti-infectives (From admission, onward)   Start     Dose/Rate Route Frequency Ordered Stop   11/08/19 0000  ceFAZolin (ANCEF) IVPB 1 g/50 mL premix    Note to Pharmacy: To be given in specials   1 g 100 mL/hr over 30 Minutes Intravenous  Once 11/07/19 1428 11/07/19 1723      Current Facility-Administered Medications  Medication Dose Route Frequency Provider Last Rate Last Admin  . abiraterone acetate (ZYTIGA) tablet 1,000 mg  1,000 mg Oral Daily Mike Gip, Melissa C, MD   1,000 mg at 11/15/19 1439  . abiraterone acetate (ZYTIGA) tablet 1,000 mg  1,000 mg Oral Once Nolon Stalls C, MD      . atorvastatin (LIPITOR) tablet 80 mg  80 mg Oral Daily Algernon Huxley, MD   80 mg at 11/15/19 1708  . carvedilol (COREG) tablet 25 mg  25 mg Oral BID WC Algernon Huxley, MD   25 mg at 11/16/19 0841  . Chlorhexidine Gluconate Cloth 2 % PADS 6 each  6 each Topical Daily Algernon Huxley, MD   6 each at 11/15/19 [phone]  . DULoxetine (CYMBALTA) DR capsule 60 mg  60 mg Oral Daily Algernon Huxley, MD   60 mg at 11/16/19 0841  . feeding supplement (ENSURE ENLIVE) (ENSURE ENLIVE) liquid 237 mL  237 mL Oral TID WC & HS Enzo Bi, MD   237 mL at 11/15/19 2006  . finasteride (PROSCAR) tablet 5 mg  5 mg Oral Daily Algernon Huxley, MD   5 mg at 11/16/19 0841  .  HYDROcodone-acetaminophen (NORCO/VICODIN) 5-325 MG per tablet 1-2 tablet  1-2 tablet Oral Q4H PRN Arne Cleveland, MD      . insulin aspart (novoLOG) injection 0-15 Units  0-15 Units Subcutaneous TID WC Enzo Bi, MD   3 Units at 11/16/19 [phone]  . insulin aspart (novoLOG) injection 5 Units  5 Units Subcutaneous TID WC Enzo Bi, MD   5 Units at 11/16/19 [phone]  . multivitamin with minerals tablet 1 tablet  1 tablet Oral Daily Enzo Bi, MD   1 tablet at 11/16/19 0841  . ondansetron (ZOFRAN) injection 4 mg  4 mg Intravenous Q6H PRN Algernon Huxley, MD   4 mg at 11/13/19 1810  . opium-belladonna (B&O SUPPRETTES) 16.2-60 MG suppository 1 suppository  1 suppository Rectal Q8H PRN Algernon Huxley, MD      . oxyCODONE-acetaminophen (PERCOCET/ROXICET) 5-325 MG per tablet 1 tablet  1 tablet Oral Q8H PRN Basilio Cairo, NP   1 tablet at 11/10/19 1540  . predniSONE (DELTASONE) tablet 5 mg  5 mg Oral QPC breakfast Nolon Stalls C, MD   5 mg at 11/16/19 0842   Objective: Vital signs in last 24 hours: Temp:  [97.8 F (36.6 C)-98.6 F (37 C)] 98.6 F (37 C) (01/27 0838) Pulse Rate:  [69-72] 72 (01/27 0838)  Resp:  [18-20] 18 (01/27 0838) BP: (140-146)/(65) 140/65 (01/27 0838) SpO2:  [99 %] 99 % (01/27 0838)  Intake/Output from previous day: 01/26 0701 - 01/27 0700 In: 0  Out: 1650 [Urine:1650] Intake/Output this shift: Total I/O In: -  Out: 250 [Urine:250]  Physical Exam Vitals and nursing note reviewed.  Constitutional:      General: He is not in acute distress.    Appearance: He is not ill-appearing, toxic-appearing or diaphoretic.  HENT:     Head: Normocephalic and atraumatic.  Pulmonary:     Effort: Pulmonary effort is normal. No respiratory distress.  Skin:    General: Skin is warm and dry.  Neurological:     Mental Status: Mental status is at baseline. He is lethargic.    Lab Results:  Recent Labs    11/15/19 0510 11/16/19 0727  WBC 12.4* 13.7*  HGB 7.8* 7.6*  HCT 25.3* 24.1*   PLT 352 383   BMET Recent Labs    11/15/19 0510 11/16/19 0727  NA 137 139  K 4.6 4.3  CL 100 102  CO2 31 27  GLUCOSE 196* 213*  BUN 27* 27*  CREATININE 1.52* 1.35*  CALCIUM 9.3 9.1   Assessment & Plan: 86 year old male on multiple anticoagulants admitted with gross hematuria.  Anticoagulation held, IVC filter placed, CBI started.  New diagnosis metastatic prostate cancer, therapy initiated.  CBI now discontinued and he has passed a voiding trial.  No new recommendations today.  Will defer to vascular surgery regarding continuation of IVC filter and anticoagulation.  Okay to discharge from the urologic perspective.  Debroah Loop, PA-C 11/16/2019

## 2019-11-16 NOTE — Progress Notes (Signed)
Inpatient Diabetes Program Recommendations  AACE/ADA: New Consensus Statement on Inpatient Glycemic Control (2015)  Target Ranges:  Prepandial:   less than 140 mg/dL      Peak postprandial:   less than 180 mg/dL (1-2 hours)      Critically ill patients:  140 - 180 mg/dL   Lab Results  Component Value Date   GLUCAP 195 (H) 11/16/2019   HGBA1C 8.1 (H) 10/15/2019    Review of Glycemic Control Results for [name], [name] (MRN [id number]) as of 11/16/2019 11:17  Ref. Range 11/15/2019 08:29 11/15/2019 11:21 11/15/2019 16:22 11/15/2019 21:26 11/16/2019 07:37  Glucose-Capillary Latest Ref Range: 70 - 99 mg/dL 187 (H) 241 (H) 275 (H) 280 (H) 195 (H)   Diabetes history: DM2 Outpatient Diabetes medications: Levemir 30 units bid + Novolog 12 units tid Current orders for Inpatient glycemic control: Novolog 5 units tid + Novolog moderate correction tid  Inpatient Diabetes Program Recommendations:   Noted postprandial CBGs elevated. -Increase Novolog meal coverage to 7 units tid if eats 50%.  Thank you, Nani Gasser. Tamorah Hada, RN, MSN, CDE  Diabetes Coordinator Inpatient Glycemic Control Team Team Pager [phone] (8am-5pm) 11/16/2019 11:18 AM

## 2019-11-16 NOTE — Care Plan (Signed)
Palliative:  I have requested and communicated with Billey Chang, NP with palliative oncology to follow with Mr. Sedore and daughter, [name], as it appears he is nearing potential discharge.   No charge and I did not see patient today.  Vinie Sill, NP Palliative Medicine Team Pager [phone] (Please see [email or web address] for schedule) Team Phone [phone]

## 2019-11-16 NOTE — TOC Progression Note (Signed)
Transition of Care [facility]) - Progression Note    Patient Details  Name: [name] MRN: [id number] Date of Birth: 1931/12/12  [facility] CM/SW Contact  Beverly Sessions, RN Phone Number: 11/16/2019, 2:59 PM  Clinical Narrative:    RNCM confirmed with patient and daughter that plan is for patient to return to [facility] at discharge  [facility] contacted [facility] and faxed clinical for auth  Repeat covid test ordered         Expected Discharge Plan and Services                                                 Social Determinants of Health (SDOH) Interventions    Readmission Risk Interventions No flowsheet data found.

## 2019-11-17 LAB — BASIC METABOLIC PANEL
Anion gap: 9 (ref 5–15)
BUN: 28 mg/dL — ABNORMAL HIGH (ref 8–23)
CO2: 28 mmol/L (ref 22–32)
Calcium: 9.3 mg/dL (ref 8.9–10.3)
Chloride: 101 mmol/L (ref 98–111)
Creatinine, Ser: 1.42 mg/dL — ABNORMAL HIGH (ref 0.61–1.24)
GFR calc Af Amer: 52 mL/min — ABNORMAL LOW (ref >60)
GFR calc non Af Amer: 45 mL/min — ABNORMAL LOW (ref >60)
Glucose, Bld: 251 mg/dL — ABNORMAL HIGH (ref 70–99)
Potassium: 4.1 mmol/L (ref 3.5–5.1)
Sodium: 138 mmol/L (ref 135–145)

## 2019-11-17 LAB — CBC
HCT: 24.6 % — ABNORMAL LOW (ref 39.0–52.0)
Hemoglobin: 7.6 g/dL — ABNORMAL LOW (ref 13.0–17.0)
MCH: 26.9 pg (ref 26.0–34.0)
MCHC: 30.9 g/dL (ref 30.0–36.0)
MCV: 86.9 fL (ref 80.0–100.0)
Platelets: 367 10*3/uL (ref 150–400)
RBC: 2.83 MIL/uL — ABNORMAL LOW (ref 4.22–5.81)
RDW: 15.2 % (ref 11.5–15.5)
WBC: 12 10*3/uL — ABNORMAL HIGH (ref 4.0–10.5)
nRBC: 0 % (ref 0.0–0.2)

## 2019-11-17 LAB — GLUCOSE, CAPILLARY
Glucose-Capillary: 219 mg/dL — ABNORMAL HIGH (ref 70–99)
Glucose-Capillary: 284 mg/dL — ABNORMAL HIGH (ref 70–99)
Glucose-Capillary: 289 mg/dL — ABNORMAL HIGH (ref 70–99)

## 2019-11-17 LAB — MAGNESIUM: Magnesium: 2.1 mg/dL (ref 1.7–2.4)

## 2019-11-17 MED ORDER — INSULIN ASPART 100 UNIT/ML ~~LOC~~ SOLN: 5.0000 [IU] | Freq: Three times a day (TID) | SUBCUTANEOUS | 11 refills | Status: AC

## 2019-11-17 MED ORDER — INSULIN DETEMIR 100 UNIT/ML FLEXPEN: 15.0000 [IU] | PEN_INJECTOR | Freq: Every day | SUBCUTANEOUS | 11 refills | Status: DC

## 2019-11-17 MED ORDER — ENSURE ENLIVE PO LIQD: 237.0000 mL | Freq: Three times a day (TID) | ORAL | 12 refills | Status: DC

## 2019-11-17 MED ORDER — OXYCODONE-ACETAMINOPHEN 5-325 MG PO TABS: 1.0000 | ORAL_TABLET | Freq: Three times a day (TID) | ORAL | 0 refills | Status: DC | PRN

## 2019-11-17 NOTE — Progress Notes (Signed)
[name] and O x4. VSS. Pt tolerating diet well. No complaints of nausea or vomiting. IV removed intact, prescriptions given. Pt voices understanding of discharge instructions with no further questions. Patient discharged via EMS  Allergies as of 11/17/2019      Reactions   Metformin And Related Diarrhea   Intolerance due to naturally loose bowels      Medication List    STOP taking these medications   aspirin 81 MG chewable tablet   clopidogrel 75 MG tablet Commonly known as: PLAVIX   Eliquis 5 MG Tabs tablet Generic drug: apixaban   insulin aspart 100 UNIT/ML FlexPen Commonly known as: NOVOLOG Replaced by: insulin aspart 100 UNIT/ML injection   torsemide 20 MG tablet Commonly known as: DEMADEX     TAKE these medications   abiraterone acetate 250 MG tablet Commonly known as: ZYTIGA Take 4 tablets (1,000 mg total) by mouth daily. Take on an empty stomach 1 hour before or 2 hours after [name] meal   atorvastatin 80 MG tablet Commonly known as: LIPITOR Take 80 mg by mouth daily.   carvedilol 25 MG tablet Commonly known as: COREG Take 25 mg by mouth 2 (two) times daily with [name] meal.   DULoxetine 60 MG capsule Commonly known as: CYMBALTA Take 60 mg by mouth daily.   famotidine 20 MG tablet Commonly known as: PEPCID Take 20 mg by mouth at bedtime as needed for heartburn.   feeding supplement (ENSURE ENLIVE) Liqd Take 237 mLs by mouth 4 (four) times daily -  with meals and at bedtime.   finasteride 5 MG tablet Commonly known as: PROSCAR Take 5 mg by mouth daily.   insulin aspart 100 UNIT/ML injection Commonly known as: novoLOG Inject 5 Units into the skin 3 (three) times daily with meals. Replaces: insulin aspart 100 UNIT/ML FlexPen   Insulin Detemir 100 UNIT/ML Pen Commonly known as: LEVEMIR Inject 15 Units into the skin daily. What changed:   how much to take  when to take this   Multi-Vitamin tablet Take 1 tablet by mouth daily.   nitroGLYCERIN  0.4 MG SL tablet Commonly known as: NITROSTAT Place 0.4 mg under the tongue every 5 (five) minutes as needed for chest pain.   oxyCODONE-acetaminophen 5-325 MG tablet Commonly known as: PERCOCET/ROXICET Take 1 tablet by mouth every 8 (eight) hours as needed for moderate pain.   predniSONE 5 MG tablet Commonly known as: DELTASONE Take 1 tablet (5 mg total) by mouth daily with breakfast. Begin taking with initiation of abiraterone.   Vitamin D 50 MCG (2000 UT) tablet Take 2,000 Units by mouth daily.       Vitals:   11/17/19 0925 11/17/19 1247  BP: 138/61 (!) 141/58  Pulse: 69 73  Resp:    Temp:    SpO2:  100%    [name]

## 2019-11-17 NOTE — NC FL2 (Signed)
[place] LEVEL OF CARE SCREENING TOOL     IDENTIFICATION  Patient Name: [name] Birthdate: 1932/10/06 Sex: male Admission Date (Current Location): 11/04/2019  [place] and [place] Number:  Engineering geologist and Address:  [facility], [address] [place], [place] [address]      Provider Number: [id number]  Attending Physician Name and Address:  Fritzi Mandes, MD  Relative Name and Phone Number:       Current Level of Care: Hospital Recommended Level of Care: [place] Prior Approval Number:    Date Approved/Denied:   PASRR Number: [id number]  Discharge Plan: SNF    Current Diagnoses: Patient Active Problem List   Diagnosis Date Noted  . Monoclonal gammopathy   . Palliative care by specialist   . Goals of care, counseling/discussion   . Prostate cancer metastatic to bone ([place]) 11/07/2019  . Elevated INR 11/05/2019  . Acute blood loss anemia 11/05/2019  . OSA on CPAP 11/05/2019  . Dyslipidemia 11/05/2019  . Morbid obesity ([place]) 11/05/2019  . DVT (deep venous thrombosis) ([place]) 11/05/2019  . Hematuria 11/04/2019  . Hepatitis C antibody test positive 10/20/2019  . Anasarca 10/15/2019  . Acute exacerbation of CHF (congestive heart failure) ([place]) 10/15/2019  . CAD (coronary artery disease) 10/15/2019  . Insulin dependent type 2 diabetes mellitus ([place]) 10/15/2019  . Essential hypertension 10/15/2019  . Chronic anticoagulation 10/15/2019  . Acute kidney injury superimposed on CKD ([place]) 10/15/2019  . CKD (chronic kidney disease) stage 3, GFR 30-59 ml/min 10/15/2019  . Generalized weakness 10/15/2019  . Frequent falls 10/15/2019  . Elevated liver enzymes 10/15/2019  . Chronic diastolic CHF (congestive heart failure) ([place]) 10/15/2019  . Elevated LFTs     Orientation RESPIRATION BLADDER Height & Weight     Self, Time, Situation, Place  Normal Incontinent Weight: 115.4 kg Height:  5\' 11"  (180.3 cm)   BEHAVIORAL SYMPTOMS/MOOD NEUROLOGICAL BOWEL NUTRITION STATUS      Incontinent Diet(regular)  AMBULATORY STATUS COMMUNICATION OF NEEDS Skin   Extensive Assist Verbally Normal                       Personal Care Assistance Level of Assistance              Functional Limitations Info             SPECIAL CARE FACTORS FREQUENCY  PT (By licensed PT), OT (By licensed OT)                    Contractures Contractures Info: Not present    Additional Factors Info  Code Status, Allergies Code Status Info: Full Allergies Info: Metformin and related           Current Medications (11/17/2019):  This is the current hospital active medication list Current Facility-Administered Medications  Medication Dose Route Frequency Provider Last Rate Last Admin  . abiraterone acetate (ZYTIGA) tablet 1,000 mg  1,000 mg Oral Daily Nolon Stalls C, MD   1,000 mg at 11/17/19 1024  . abiraterone acetate (ZYTIGA) tablet 1,000 mg  1,000 mg Oral Once Corcoran, Melissa C, MD      . atorvastatin (LIPITOR) tablet 80 mg  80 mg Oral Daily [place] Huxley, MD   80 mg at 11/16/19 1803  . carvedilol (COREG) tablet 25 mg  25 mg Oral BID WC [place] Huxley, MD   25 mg at 11/17/19 0921  . Chlorhexidine Gluconate Cloth 2 % PADS  6 each  6 each Topical Daily [place] Huxley, MD   6 each at 11/16/19 [phone]  . DULoxetine (CYMBALTA) DR capsule 60 mg  60 mg Oral Daily [place] Huxley, MD   60 mg at 11/17/19 0623  . feeding supplement (ENSURE ENLIVE) (ENSURE ENLIVE) liquid 237 mL  237 mL Oral TID WC & HS Enzo Bi, MD   237 mL at 11/17/19 0921  . finasteride (PROSCAR) tablet 5 mg  5 mg Oral Daily [place] Huxley, MD   5 mg at 11/17/19 7628  . HYDROcodone-acetaminophen (NORCO/VICODIN) 5-325 MG per tablet 1-2 tablet  1-2 tablet Oral Q4H PRN Arne Cleveland, MD      . insulin aspart (novoLOG) injection 0-15 Units  0-15 Units Subcutaneous TID AC & HS Sharion Settler, NP   5 Units at 11/17/19 [phone]  . insulin aspart (novoLOG)  injection 5 Units  5 Units Subcutaneous TID WC Enzo Bi, MD   5 Units at 11/17/19 [phone]  . multivitamin with minerals tablet 1 tablet  1 tablet Oral Daily Enzo Bi, MD   1 tablet at 11/17/19 [phone]  . ondansetron (ZOFRAN) injection 4 mg  4 mg Intravenous Q6H PRN [place] Huxley, MD   4 mg at 11/13/19 1810  . opium-belladonna (B&O SUPPRETTES) 16.2-60 MG suppository 1 suppository  1 suppository Rectal Q8H PRN [place] Huxley, MD      . oxyCODONE-acetaminophen (PERCOCET/ROXICET) 5-325 MG per tablet 1 tablet  1 tablet Oral Q8H PRN Basilio Cairo, NP   1 tablet at 11/10/19 1540  . predniSONE (DELTASONE) tablet 5 mg  5 mg Oral QPC breakfast Lequita Asal, MD   5 mg at 11/17/19 1062     Discharge Medications: Please see discharge summary for a list of discharge medications.  Relevant Imaging Results:  Relevant Lab Results:   Additional Information SS#: [id number]  Beverly Sessions, RN

## 2019-11-17 NOTE — Care Management Important Message (Signed)
Important Message  Patient Details  Name: [name] MRN: [id number] Date of Birth: February 05, 1932   Medicare Important Message Given:  Yes     Dannette Barbara 11/17/2019, 1:34 PM

## 2019-11-17 NOTE — TOC Transition Note (Signed)
Transition of Care [facility]) - CM/SW Discharge Note   Patient Details  Name: [name] MRN: [id number] Date of Birth: 1932-03-19  Transition of Care [facility]) CM/SW Contact:  Beverly Sessions, RN Phone Number: 11/17/2019, 2:44 PM   Clinical Narrative:    Patient to return to [place] today Claiborne Billings at [facility] notified DC info sent in the [place]  Daughter at beside and updated  Patient home supply of abiraterone picked up from inpatient pharmacy.  Hand delivered to [facility] Admissions coordinator at [facility] care so they can administer.  Patient and daughter updated          Patient Goals and CMS Choice        Discharge Placement                       Discharge Plan and Services                                     Social Determinants of Health (SDOH) Interventions     Readmission Risk Interventions Readmission Risk Prevention Plan 11/17/2019  Transportation Screening Complete  PCP or Specialist Appt within 3-5 Days (No Data)  [place] or [place] (No Data)  Palliative Care Screening Complete  Medication Review (RN Care Manager) Complete

## 2019-11-17 NOTE — Discharge Summary (Signed)
[place] [facility] [place] NAME: [name]    MR#:  [id number]  DATE OF BIRTH:  01-11-1932  DATE OF ADMISSION:  11/04/2019 ADMITTING PHYSICIAN: Orene Desanctis, DO  DATE OF DISCHARGE:   PRIMARY CARE PHYSICIAN: [name], PA-C    ADMISSION DIAGNOSIS:  Anticoagulated [Z79.01] Hematuria [R31.9] Hematuria, unspecified type [R31.9]  DISCHARGE DIAGNOSIS:  Hematuria due Advanced metastatic Prostate cancer --on chemo  SECONDARY DIAGNOSIS:   Past Medical History:  Diagnosis Date  . Anemia, normocytic normochromic   . BPH (benign prostatic hyperplasia)   . CAD (coronary artery disease)   . Cataracts, bilateral   . Chronic renal insufficiency   . Colon polyp   . COPD (chronic obstructive pulmonary disease) ([place])   . Diabetes mellitus without complication ([place])   . DVT (deep venous thrombosis) ([place])   . Edema of both legs   . Glaucoma   . Gout   . Heart murmur   . Hepatitis C antibody test positive 10/20/2019  . Hyperlipidemia   . Hypertension   . Moderate persistent asthma without complication   . Myocardial infarction ([place])   . Neuromuscular disorder ([place])   . Neuropathy   . Peripheral vascular disease ([place])   . Premature ejaculation   . Prostate cancer ([place])   . RLS (restless legs syndrome)   . Sleep apnea   . Stented coronary artery 11/13/2015  . Uncontrolled type 2 diabetes mellitus with stage 3 chronic kidney disease, with long-term current use of insulin [facility])     HOSPITAL COURSE:   86 year old AA male with past medical history of CAD status post stent, chronic diastolic heart failure, sleep apnea, diabetes mellitus, stage III chronic kidney disease and recent DVT diagnosed 3 months ago placed on Eliquis who presented with hematuria.  Hematuria and acute blood loss anemia in patient with prostate cancer  --Anticoagulation discontinued. Being followed by urology.  -Hemoglobin remaining stable for now. --Had received  bladder irrigation --Remove foleyon 1/25andstarted voiding trial withbladder scan q4h.So far, pt has been voiding on his own.  Metastatic prostate cancer with widespread metastases to bone as well as liver:  --PSA greater than 2000.  -Oncology Dr Alvia Grove consult appreciated -The patient's wife passed away in the last year from pancreatic cancer. -Discussed with daughter. Decided to go forward with treatment withdegarelix (shots 3 weekly) and abiraterone and prednisone (Oral daily)  Pathologic L1 fracture minimally symptomatic --Ortho consulted, and rec observation  CAD (coronary artery disease) stable. -ASA/plavix held due to hematuria  Insulin dependent type 2 diabetes mellitus ([place]):On sliding scale. -Lantus 15 units daily and increase dosing depending on sugars (pt was on 30 units bidat home--not eating much currently)  Essential hypertension: -Blood pressures have been stable to slightly on the high end.  -Continue home coreg.  CKD (chronic kidney disease) stage 3a,GFR 30-59 ml/min: - Stable,At baseline. -creat 1.4  Chronic diastolic CHF (congestive heart failure) ([place]):   --Diurese PRN --continue home coreg  OSA on CPAP -Nightly CPAP.  Dyslipidemia: - Continue statin.  DVT (deep venous thrombosis) ([place]): Diagnosed 3 months ago. Can no longer be on anticoagulation due to significant hematuria -Vascular surgery placed IVC filter on 1/18  Normocytic anemia --Ferritin 1454 (acute phase reactant) with iron saturation 20% and TIBC 1389. B12 and folate normal. --s/p 1u pRBC --Maintain hemoglobin 7-8.  --palliative carewas consulted during this hospitalization--pt will be [facility] by [facility] as out pt. He remains FULL CODE  Procedures: CBI, IVC filter  Family communication : daughter [name] on the phone Consults : urology, vascular, oncology, orthopedics Discharge Disposition : rehab today  CODE STATUS: full DVT Prophylaxis :  SCD Barriers to discharge: none  CONSULTS OBTAINED:  Treatment Team:  Lucas Mallow, MD Hessie Knows, MD  DRUG ALLERGIES:   Allergies  Allergen Reactions  . Metformin And Related Diarrhea    Intolerance due to naturally loose bowels    DISCHARGE MEDICATIONS:   Allergies as of 11/17/2019      Reactions   Metformin And Related Diarrhea   Intolerance due to naturally loose bowels      Medication List    STOP taking these medications   aspirin 81 MG chewable tablet   clopidogrel 75 MG tablet Commonly known as: PLAVIX   Eliquis 5 MG Tabs tablet Generic drug: apixaban   insulin aspart 100 UNIT/ML FlexPen Commonly known as: NOVOLOG Replaced by: insulin aspart 100 UNIT/ML injection   torsemide 20 MG tablet Commonly known as: DEMADEX     TAKE these medications   abiraterone acetate 250 MG tablet Commonly known as: ZYTIGA Take 4 tablets (1,000 mg total) by mouth daily. Take on an empty stomach 1 hour before or 2 hours after a meal   atorvastatin 80 MG tablet Commonly known as: LIPITOR Take 80 mg by mouth daily.   carvedilol 25 MG tablet Commonly known as: COREG Take 25 mg by mouth 2 (two) times daily with a meal.   DULoxetine 60 MG capsule Commonly known as: CYMBALTA Take 60 mg by mouth daily.   famotidine 20 MG tablet Commonly known as: PEPCID Take 20 mg by mouth at bedtime as needed for heartburn.   feeding supplement (ENSURE ENLIVE) Liqd Take 237 mLs by mouth 4 (four) times daily -  with meals and at bedtime.   finasteride 5 MG tablet Commonly known as: PROSCAR Take 5 mg by mouth daily.   insulin aspart 100 UNIT/ML injection Commonly known as: novoLOG Inject 5 Units into the skin 3 (three) times daily with meals. Replaces: insulin aspart 100 UNIT/ML FlexPen   Insulin Detemir 100 UNIT/ML Pen Commonly known as: LEVEMIR Inject 15 Units into the skin daily. What changed:  how much to take when to take this   Multi-Vitamin tablet Take 1  tablet by mouth daily.   nitroGLYCERIN 0.4 MG SL tablet Commonly known as: NITROSTAT Place 0.4 mg under the tongue every 5 (five) minutes as needed for chest pain.   oxyCODONE-acetaminophen 5-325 MG tablet Commonly known as: PERCOCET/ROXICET Take 1 tablet by mouth every 8 (eight) hours as needed for moderate pain.   predniSONE 5 MG tablet Commonly known as: DELTASONE Take 1 tablet (5 mg total) by mouth daily with breakfast. Begin taking with initiation of abiraterone.   Vitamin D 50 MCG (2000 UT) tablet Take 2,000 Units by mouth daily.       If you experience worsening of your admission symptoms, develop shortness of breath, life threatening emergency, suicidal or homicidal thoughts you must seek medical attention immediately by calling 911 or calling your MD immediately  if symptoms less severe.  You Must read complete instructions/literature along with all the possible adverse reactions/side effects for all the Medicines you take and that have been prescribed to you. Take any new Medicines after you have completely understood and accept all the possible adverse reactions/side effects.   Please note  You were cared for by a hospitalist during your hospital stay. If you have any questions about your discharge medications  or the care you received while you were in the hospital after you are discharged, you can call the unit and asked to speak with the hospitalist on call if the hospitalist that took care of you is not available. Once you are discharged, your primary care physician will handle any further medical issues. Please note that NO REFILLS for any discharge medications will be authorized once you are discharged, as it is imperative that you return to your primary care physician (or establish a relationship with a primary care physician if you do not have one) for your aftercare needs so that they can reassess your need for medications and monitor your lab values. Today    SUBJECTIVE   Slept well. Drinking ensure, milk. Does not feel like eating BF   VITAL SIGNS:  Blood pressure 138/61, pulse 69, temperature 97.9 F (36.6 C), temperature source Oral, resp. rate 15, height 5\' 11"  (1.803 m), weight 115.4 kg, SpO2 95 %.  I/O:    Intake/Output Summary (Last 24 hours) at 11/17/2019 0945 Last data filed at 11/17/2019 0405 Gross per 24 hour  Intake 120 ml  Output 1101 ml  Net -981 ml    PHYSICAL EXAMINATION:  GENERAL:  86 y.o.-year-old patient lying in the bed with no acute distress.  EYES: Pupils equal, round, reactive to light and accommodation. No scleral icterus. Pallor+ HEENT: Head atraumatic, normocephalic. Oropharynx and nasopharynx clear.  NECK:  Supple, no jugular venous distention. No thyroid enlargement, no tenderness.  LUNGS: Normal breath sounds bilaterally, no wheezing, rales,rhonchi or crepitation. No use of accessory muscles of respiration.  CARDIOVASCULAR: S1, S2 normal. No murmurs, rubs, or gallops.  ABDOMEN: Soft, non-tender, non-distended. Bowel sounds present. No organomegaly or mass EXTREMITIES: No pedal edema, cyanosis, or clubbing.  NEUROLOGIC: Cranial nerves II through XII are intact. Muscle strength 5/5 in all extremities. Sensation intact. Gait not checked.  PSYCHIATRIC: The patient is alert and oriented x 3.  SKIN: No obvious rash, lesion, or ulcer.   DATA REVIEW:   CBC  Recent Labs  Lab 11/17/19 0717  WBC 12.0*  HGB 7.6*  HCT 24.6*  PLT 367    Chemistries  Recent Labs  Lab 11/17/19 0717  NA 138  K 4.1  CL 101  CO2 28  GLUCOSE 251*  BUN 28*  CREATININE 1.42*  CALCIUM 9.3  MG 2.1    Microbiology Results   Recent Results (from the past 240 hour(s))  SARS CORONAVIRUS 2 (TAT 6-24 HRS) Nasopharyngeal Nasopharyngeal Swab     Status: None   Collection Time: 11/16/19  4:15 PM   Specimen: Nasopharyngeal Swab  Result Value Ref Range Status   SARS Coronavirus 2 NEGATIVE NEGATIVE Final    Comment:  (NOTE) SARS-CoV-2 target nucleic acids are NOT DETECTED. The SARS-CoV-2 RNA is generally detectable in upper and lower respiratory specimens during the acute phase of infection. Negative results do not preclude SARS-CoV-2 infection, do not rule out co-infections with other pathogens, and should not be used as the sole basis for treatment or other patient management decisions. Negative results must be combined with clinical observations, patient history, and epidemiological information. The expected result is Negative. Fact Sheet for Patients: SugarRoll.be Fact Sheet for Healthcare Providers: https://www.woods-mathews.com/ This test is not yet approved or cleared by the [place] FDA and  has been authorized for detection and/or diagnosis of SARS-CoV-2 by FDA under an Emergency Use Authorization (EUA). This EUA will remain  in effect (meaning this test can be used) for the duration of  the COVID-19 declaration under Section 56 4(b)(1) of the Act, 21 U.S.C. section 360bbb-3(b)(1), unless the authorization is terminated or revoked sooner. Performed at [facility], [place] [address], [place], [place] [address]     RADIOLOGY:  No results found.   CODE STATUS:     Code Status Orders  (From admission, onward)         Start     Ordered   11/04/19 2313  Full code  Continuous     11/04/19 2313        Code Status History    Date Active Date Inactive Code Status Order ID Comments User Context   10/15/2019 0114 10/20/2019 2141 Full Code [id number]  Athena Masse, MD ED   Advance Care Planning Activity    Advance Directive Documentation     Most Recent Value  Type of Advance Directive  Healthcare Power of [place], Living will  Pre-existing out of facility DNR order (yellow form or pink MOST form)  --  "MOST" Form in Place?  --       TOTAL TIME TAKING CARE OF THIS PATIENT: *40* minutes.    Fritzi Mandes M.D  [facility]    CC: Primary care physician; [name], PA-C

## 2019-11-24 ENCOUNTER — Encounter (HOSPITAL_COMMUNITY): Payer: Self-pay | Admitting: Hematology and Oncology

## 2019-12-06 ENCOUNTER — Other Ambulatory Visit: Payer: Self-pay | Admitting: Hematology and Oncology

## 2019-12-06 DIAGNOSIS — C61 Malignant neoplasm of prostate: Secondary | ICD-10-CM

## 2019-12-06 DIAGNOSIS — C7951 Secondary malignant neoplasm of bone: Secondary | ICD-10-CM

## 2019-12-06 NOTE — Telephone Encounter (Signed)
Contains abnormal data CBC Order: [id number] Status:  Final result  Visible to patient:  Yes (MyChart)  Next appt:  12/16/2019 at 02:30 PM in Vascular Surgery ([facility] VASC 1)  Ref Range & Units 2 wk ago  WBC 4.0 - 10.5 K/uL 12.0High    RBC 4.22 - 5.81 MIL/uL 2.83Low    Hemoglobin 13.0 - 17.0 g/dL 7.6Low    HCT 39.0 - 52.0 % 24.6Low    MCV 80.0 - 100.0 fL 86.9   MCH 26.0 - 34.0 pg 26.9   MCHC 30.0 - 36.0 g/dL 30.9   RDW 11.5 - 15.5 % 15.2   Platelets 150 - 400 K/uL 367   nRBC 0.0 - 0.2 % 0.0   Comment: Performed at [facility], [address] [place], [place] [address]  Resulting Agency  [facility] LAB      Specimen Collected: 11/17/19 07:17  Last Resulted: 11/17/19 07:52         Basic metabolic panel Order: [id number] Status:  Final result  Visible to patient:  Yes (MyChart)  Next appt:  12/16/2019 at 02:30 PM in Vascular Surgery ([facility] VASC 1)  Ref Range & Units 2 wk ago  Sodium 135 - 145 mmol/L 138   Potassium 3.5 - 5.1 mmol/L 4.1   Chloride 98 - 111 mmol/L 101   CO2 22 - 32 mmol/L 28   Glucose, Bld 70 - 99 mg/dL 251High    BUN 8 - 23 mg/dL 28High    Creatinine, Ser 0.61 - 1.24 mg/dL 1.42High    Calcium 8.9 - 10.3 mg/dL 9.3   GFR calc non Af Amer >60 mL/min 45Low    GFR calc Af Amer >60 mL/min 52Low    Anion gap 5 - 15 9   Comment: Performed at [facility], [place] [place], [place] [address]  Resulting Agency  [facility] LAB      Specimen Collected: 11/17/19 07:17  Last Resulted: 11/17/19 08:13       Magnesium Order: [id number] Status:  Final result  Visible to patient:  Yes (MyChart)  Next appt:  12/16/2019 at 02:30 PM in Vascular Surgery ([facility] VASC 1)  Ref Range & Units 2 wk ago  Magnesium 1.7 - 2.4 mg/dL 2.1   Comment: Performed at [facility], [address], [place], [place] [address]  Resulting Agency  [facility] CLIN LAB      Specimen Collected: 11/17/19 07:17  Last Resulted: 11/17/19 08:13

## 2019-12-07 MED FILL — ABIRATERONE ACETATE 250 MG: 250 | 30 days supply | Qty: 120 | Fill #0

## 2019-12-08 ENCOUNTER — Encounter: Payer: Self-pay | Admitting: Emergency Medicine

## 2019-12-08 ENCOUNTER — Other Ambulatory Visit: Payer: Self-pay

## 2019-12-08 ENCOUNTER — Emergency Department
Admission: EM | Admit: 2019-12-08 | Discharge: 2019-12-08 | Disposition: A | Discharge status: Home / Self Care | Payer: Medicare Other | Attending: Emergency Medicine | Admitting: Emergency Medicine

## 2019-12-08 DIAGNOSIS — Z87891 Personal history of nicotine dependence: Secondary | ICD-10-CM | POA: Diagnosis not present

## 2019-12-08 DIAGNOSIS — C7951 Secondary malignant neoplasm of bone: Secondary | ICD-10-CM | POA: Diagnosis not present

## 2019-12-08 DIAGNOSIS — J449 Chronic obstructive pulmonary disease, unspecified: Secondary | ICD-10-CM | POA: Insufficient documentation

## 2019-12-08 DIAGNOSIS — E1122 Type 2 diabetes mellitus with diabetic chronic kidney disease: Secondary | ICD-10-CM | POA: Diagnosis not present

## 2019-12-08 DIAGNOSIS — Z79899 Other long term (current) drug therapy: Secondary | ICD-10-CM | POA: Diagnosis not present

## 2019-12-08 DIAGNOSIS — I5032 Chronic diastolic (congestive) heart failure: Secondary | ICD-10-CM | POA: Insufficient documentation

## 2019-12-08 DIAGNOSIS — Z955 Presence of coronary angioplasty implant and graft: Secondary | ICD-10-CM | POA: Diagnosis not present

## 2019-12-08 DIAGNOSIS — E11649 Type 2 diabetes mellitus with hypoglycemia without coma: Secondary | ICD-10-CM | POA: Diagnosis not present

## 2019-12-08 DIAGNOSIS — Z8546 Personal history of malignant neoplasm of prostate: Secondary | ICD-10-CM | POA: Diagnosis not present

## 2019-12-08 DIAGNOSIS — Z794 Long term (current) use of insulin: Secondary | ICD-10-CM | POA: Diagnosis not present

## 2019-12-08 DIAGNOSIS — I130 Hypertensive heart and chronic kidney disease with heart failure and stage 1 through stage 4 chronic kidney disease, or unspecified chronic kidney disease: Secondary | ICD-10-CM | POA: Diagnosis not present

## 2019-12-08 DIAGNOSIS — R4182 Altered mental status, unspecified: Secondary | ICD-10-CM | POA: Diagnosis present

## 2019-12-08 DIAGNOSIS — N183 Chronic kidney disease, stage 3 unspecified: Secondary | ICD-10-CM | POA: Insufficient documentation

## 2019-12-08 DIAGNOSIS — I2510 Atherosclerotic heart disease of native coronary artery without angina pectoris: Secondary | ICD-10-CM | POA: Diagnosis not present

## 2019-12-08 DIAGNOSIS — E162 Hypoglycemia, unspecified: Secondary | ICD-10-CM

## 2019-12-08 LAB — CBC WITH DIFFERENTIAL/PLATELET
Abs Immature Granulocytes: 0.03 10*3/uL (ref 0.00–0.07)
Basophils Absolute: 0.1 10*3/uL (ref 0.0–0.1)
Basophils Relative: 1 %
Eosinophils Absolute: 0.4 10*3/uL (ref 0.0–0.5)
Eosinophils Relative: 5 %
HCT: 35 % — ABNORMAL LOW (ref 39.0–52.0)
Hemoglobin: 11 g/dL — ABNORMAL LOW (ref 13.0–17.0)
Immature Granulocytes: 0 %
Lymphocytes Relative: 21 %
Lymphs Abs: 1.6 10*3/uL (ref 0.7–4.0)
MCH: 27.6 pg (ref 26.0–34.0)
MCHC: 31.4 g/dL (ref 30.0–36.0)
MCV: 87.7 fL (ref 80.0–100.0)
Monocytes Absolute: 0.6 10*3/uL (ref 0.1–1.0)
Monocytes Relative: 7 %
Neutro Abs: 5.1 10*3/uL (ref 1.7–7.7)
Neutrophils Relative %: 66 %
Platelets: 249 10*3/uL (ref 150–400)
RBC: 3.99 MIL/uL — ABNORMAL LOW (ref 4.22–5.81)
RDW: 16.6 % — ABNORMAL HIGH (ref 11.5–15.5)
WBC: 7.7 10*3/uL (ref 4.0–10.5)
nRBC: 0 % (ref 0.0–0.2)

## 2019-12-08 LAB — GLUCOSE, CAPILLARY
Glucose-Capillary: 130 mg/dL — ABNORMAL HIGH (ref 70–99)
Glucose-Capillary: 54 mg/dL — ABNORMAL LOW (ref 70–99)
Glucose-Capillary: 71 mg/dL (ref 70–99)
Glucose-Capillary: 74 mg/dL (ref 70–99)
Glucose-Capillary: 76 mg/dL (ref 70–99)
Glucose-Capillary: 78 mg/dL (ref 70–99)

## 2019-12-08 LAB — COMPREHENSIVE METABOLIC PANEL
ALT: 33 U/L (ref 0–44)
AST: 28 U/L (ref 15–41)
Albumin: 3.7 g/dL (ref 3.5–5.0)
Alkaline Phosphatase: 343 U/L — ABNORMAL HIGH (ref 38–126)
Anion gap: 11 (ref 5–15)
BUN: 13 mg/dL (ref 8–23)
CO2: 24 mmol/L (ref 22–32)
Calcium: 9.5 mg/dL (ref 8.9–10.3)
Chloride: 98 mmol/L (ref 98–111)
Creatinine, Ser: 1.33 mg/dL — ABNORMAL HIGH (ref 0.61–1.24)
GFR calc Af Amer: 56 mL/min — ABNORMAL LOW (ref >60)
GFR calc non Af Amer: 48 mL/min — ABNORMAL LOW (ref >60)
Glucose, Bld: 96 mg/dL (ref 70–99)
Potassium: 4 mmol/L (ref 3.5–5.1)
Sodium: 133 mmol/L — ABNORMAL LOW (ref 135–145)
Total Bilirubin: 0.7 mg/dL (ref 0.3–1.2)
Total Protein: 7.4 g/dL (ref 6.5–8.1)

## 2019-12-08 LAB — MAGNESIUM: Magnesium: 2 mg/dL (ref 1.7–2.4)

## 2019-12-08 MED ORDER — DEXTROSE 50 % IV SOLN
INTRAVENOUS | Status: AC
  Administered 2019-12-08: 01:00:00 50 mL
  Filled 2019-12-08: qty 50

## 2019-12-08 NOTE — ED Triage Notes (Signed)
Patient arrives from home with daughter after experiencing hypoglycemic episode and resulting AMS. Patient's daughter said she fed him a fruit cup tonight for dinner and he took his regular amount of insulin. Patient has decreased appetite, has metastatic CA. Was just discharged from [facility] 2 days ago

## 2019-12-08 NOTE — ED Notes (Signed)
Patient given more OJ and peanut butter crackers

## 2019-12-08 NOTE — ED Notes (Signed)
Patient's CBG 54

## 2019-12-08 NOTE — ED Notes (Signed)
Patient provided with orange juice and a meal tray, drank 4oz OJ and eating graham crackers at this time

## 2019-12-08 NOTE — ED Notes (Signed)
This RN had long conversation with patient's daughter who is his current caregiver following d/c from nursing home. Patient has been giving himself his medication and insulin, not always checking his blood sugar first. He also has a significantly decreased appetite. This RN advised daughter to follow up later today with doctor to discuss insulin dose and parameters. Told daughter she should be checking his sugar more frequently and hold off on insulin in AM. Also told her that she should be administering the insulin at this time as he may have given himself too much this evening. Patient left without any distress, alert and oriented

## 2019-12-08 NOTE — ED Notes (Signed)
Spoke with patient's daughter [name], she says he always has a poor appetite. Says he is baseline besides being more sleepy. Will update her accordingly

## 2019-12-08 NOTE — Discharge Instructions (Signed)
As we discussed, your blood sugar dropped too low today.  We recommend that you eat small snacks and keep a very careful watch on your blood sugar.  We recommend you skip your insulin this morning and follow-up with your regular doctor at the next available opportunity.  Return to the emergency department with new or worsening symptoms that concern you.

## 2019-12-08 NOTE — Progress Notes (Signed)
[facility]  [address], [address] [place], [place] [address] Phone: [phone]  Fax: [phone]   Clinic Day:  12/09/2019  Referring physician: [name], PA-C  Chief Complaint: Jahari Billy is a 86 y.o. male with a metastatic prostate cancer and stage III chronic kidney disease who is seen for assessment after interval hospitalization and continuation of monthly degarelix.  HPI:  The patient has been a resident at [facility] since 09/2019 when he was admitted after multiple falls.   He has a history of CAD s/p stent, chronic diastolic heart failure, stage III kidney disease, and DVT on Eliquis, Plavix, and aspirin.  At the care facility, he was noted to have 2-5 days of hematuria.  He noted weight loss (previously weighed 338 pounds).  Admission hospital weight was 254 pounds.  He was admitted to [facility] from 11/04/2019 - 11/17/2019 with gross hematuria.  CBC revealed a hematocrit of 26.1, hemoglobin 8.3, MCV 85.9, and platelets 262,000 (hematocrit 29.8, hemoglobin 9.5 on 10/19/2019).  Creatinine was 1.98 (2.05 on 10/20/2019).  PT was 22.4 (INR 2.0) and PTT 53.  Additional labs included an allkaline phosphatase of 159 (38-126).  Albumin was 3.2 with a protein of 7.0 and a calcium of 9.3.  He began bladder irrigation.  Aspirin, Plavix, and Eliquis were held.  IVC filter placed on 11/07/2019.  Hemoglobin dropped to 7.9 on 11/05/2019.  Ferritin was 1454 (acute phase reactant) with an iron saturation of 20% and a TIBC 1389.  B12 and folate were normal.  He received 1 unit of PRBCs on 11/05/2019.  Discharge hemoglobin was 7.6.   Renal stone CT on 11/05/2019 revealed suspected metastatic prostate carcinoma with confluent nodular masses adjacent to the prostate and distal sigmoid colon with left pelvic, perirectal, retroperitoneal, and mesenteric adenopathy as well as probable peritoneal disease most marked in the right upper quadrant extending about the  right diaphragmatic leaflet.  There was pelvic and suspected vertebral osseous metastatic disease with L1 compression fracture deformity, likely pathologic.  There was a possible 2 cm right lower lobe lung nodule.  There was left hydronephrosis and proximal ureterectasis secondary to pelvic process (prior renal ultrasound on 10/16/2019 revealed no hydronephrosis).  PSA  was 2241 on 11/06/2019.  He received degarelix on 11/11/2019 and began abiraterone on 11/14/2019.  Foley was removed on 11/14/2019.  He was seen by orthopedics for L1 fracture.  Observation was recommended.  SPEP on 11/08/2019 revealed a 0.4 gm/dL + an additional 0.2 gm/dL biclonal IgA protein with kappa specificity.              Bone marrow aspirate and biopsy on 11/15/2019 revealed a hypercellular bone marrow for age with metastatic carcinoma c/w prostate cancer.  There was slight plasmacytosis (4% plasma cells).  Plasma cells displayed polyclonal staining for kappa and lambda light chains with kappa light chain excess.  Findings were most suggestive of early involvement by a plasma cell neoplasm.  Cytogenetics were normal (84, XX).  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  He was discharged to [facility].  He went home on x/x/2021.  He was seen in the [facility] ER on 12/08/2019 for hypoglycemia.  He presented with altered mental status.  He was treated with dextrose 50% (50 ml).  He drank orange juice.  Labs included a hematocrit of 35.0, hemoglobin 11.0, MCV 87.7, platelets 249,000, white count 7700 with an ANC of 5100.  Sodium was 133, BUN 13, creatinine 1.33, calcium 9.5, BUN 3.7,  alkaline phosphatase 343, AST 28, ALT 33 and bilirubin 0.7.  Symptomatically, he feels sleepy. He hasn't been getting sleep. He has had chronic nausea and vomiting since 09/2019. He notes edema in his legs this morning.  He is off blood thinner and s/p IVC placement secondary to gross hematuria.   He came home 12/05/2019 from rehab and is  living with his daughter. In rehab, he did not eat much, but would drink Ensure consistently. Daughter notes that he has been eating some at home along with Ensure.    He needs some assistance with a few daily living activities, such as bathing and using the restroom.  He can get dressed on his own.  He has chronic nausea and vomiting (clear fluid).  At times his vomiting is related to what he eats. He denies having night sweats or hot flashes. His daughter says her father has naturally been a cold natured person, and since he has been home he hasn't required as many blankets or constant heat.    He will begin physically therapy at home today.   He is to see Dr Philis Fendt on 12/12/2019.  He was taking 20 units of insulin but daughter now manages his insulin injections given his recent episode of hypoglycemia.  If blood sugar numbers are too high she will give him an injection of 15 units.    Past Medical History:  Diagnosis Date  . Anemia, normocytic normochromic   . BPH (benign prostatic hyperplasia)   . CAD (coronary artery disease)   . Cataracts, bilateral   . Chronic renal insufficiency   . Colon polyp   . COPD (chronic obstructive pulmonary disease) ([place])   . Diabetes mellitus without complication ([place])   . DVT (deep venous thrombosis) ([place])   . Edema of both legs   . Glaucoma   . Gout   . Heart murmur   . Hepatitis C antibody test positive 10/20/2019  . Hyperlipidemia   . Hypertension   . Moderate persistent asthma without complication   . Myocardial infarction ([place])   . Neuromuscular disorder ([place])   . Neuropathy   . Peripheral vascular disease ([place])   . Premature ejaculation   . Prostate cancer ([place])   . RLS (restless legs syndrome)   . Sleep apnea   . Stented coronary artery 11/13/2015  . Uncontrolled type 2 diabetes mellitus with stage 3 chronic kidney disease, with long-term current use of insulin ([place])     Past Surgical History:  Procedure Laterality Date  .  APPENDECTOMY    . [place]  2009  . COLONOSCOPY  2012  . EYE SURGERY    . IVC FILTER INSERTION N/A 11/07/2019   Procedure: IVC FILTER INSERTION;  Surgeon: Algernon Huxley, MD;  Location: [facility];  Service: Cardiovascular;  Laterality: N/A;  . PROSTATE SURGERY    . TONSILLECTOMY      History reviewed. No pertinent family history.  Social History:  reports that he has quit smoking. He has never used smokeless tobacco. No history on file for alcohol and drug.  He previously smoked < 1 pack/day x 20 years (stopped smoking with cardiac stent placement).  He previously drank liquor on the weekend (stopped 2020).  He is retired Dance movement psychotherapist).  He previously lived outside of [place].  He then moved to [place] then [place] to live with his daughter Arzell Mcgeehan [phone]).  His wife died of cancer 1 year ago.  The patient is  accompanied by his daughter today.  Allergies:  Allergies  Allergen Reactions  . Metformin And Related Diarrhea    Intolerance due to naturally loose bowels    Current Medications: Current Outpatient Medications  Medication Sig Dispense Refill  . abiraterone acetate (ZYTIGA) 250 MG tablet TAKE 4 TABLETS (1,000 MG TOTAL) BY MOUTH DAILY. TAKE ON AN EMPTY STOMACH 1 HOUR BEFORE OR 2 HOURS AFTER A MEAL 120 tablet 0  . atorvastatin (LIPITOR) 80 MG tablet Take 80 mg by mouth daily.    . carvedilol (COREG) 25 MG tablet Take 25 mg by mouth 2 (two) times daily with a meal.    . Cholecalciferol (VITAMIN D) 50 MCG (2000 UT) tablet Take 2,000 Units by mouth daily.     . DULoxetine (CYMBALTA) 60 MG capsule Take 60 mg by mouth daily.    . famotidine (PEPCID) 20 MG tablet Take 20 mg by mouth at bedtime as needed for heartburn.     . feeding supplement, ENSURE ENLIVE, (ENSURE ENLIVE) LIQD Take 237 mLs by mouth 4 (four) times daily -  with meals and at bedtime. 237 mL 12  . finasteride (PROSCAR) 5 MG tablet Take 5 mg by mouth daily.    .  insulin aspart (NOVOLOG) 100 UNIT/ML injection Inject 5 Units into the skin 3 (three) times daily with meals. 10 mL 11  . Insulin Detemir (LEVEMIR) 100 UNIT/ML Pen Inject 15 Units into the skin daily. 15 mL 11  . Multiple Vitamin (MULTI-VITAMIN) tablet Take 1 tablet by mouth daily.    . nitroGLYCERIN (NITROSTAT) 0.4 MG SL tablet Place 0.4 mg under the tongue every 5 (five) minutes as needed for chest pain.     Marland Kitchen oxyCODONE-acetaminophen (PERCOCET/ROXICET) 5-325 MG tablet Take 1 tablet by mouth every 8 (eight) hours as needed for moderate pain. 15 tablet 0  . predniSONE (DELTASONE) 5 MG tablet TAKE 1 TABLET BY MOUTH DAILY WITH BREAKFAST. BEGIN TAKING WITH INITIATION OF ABIRATERONE. 30 tablet 0   No current facility-administered medications for this visit.    Review of Systems  Constitutional: Positive for malaise/fatigue. Negative for chills, diaphoresis, fever and weight loss (prior weigh 338 pounds; admission weight 254 pounds;  weight loss 22 pounds).       Feels "sleepy".  HENT: Negative.  Negative for congestion, ear pain, hearing loss, nosebleeds, sinus pain and sore throat.   Eyes: Negative.  Negative for blurred vision, double vision and photophobia.  Respiratory: Negative.  Negative for cough, hemoptysis, sputum production and shortness of breath.   Cardiovascular: Positive for leg swelling. Negative for chest pain, palpitations and orthopnea.  Gastrointestinal: Positive for nausea and vomiting. Negative for abdominal pain, blood in stool, constipation, diarrhea, heartburn and melena.  Genitourinary: Negative.  Negative for dysuria, frequency and urgency.       No recurrent hematuria.  Musculoskeletal: Positive for falls (prompting ER evaluation- see HPI). Negative for back pain, myalgias and neck pain.  Skin: Negative.  Negative for itching and rash.  Neurological: Positive for dizziness. Negative for tingling, sensory change, speech change, focal weakness, weakness and headaches.   Endo/Heme/Allergies: Does not bruise/bleed easily.       Diabetes- episode of hypoglycemia.  Psychiatric/Behavioral: Negative.  Negative for depression and memory loss. The patient is not nervous/anxious and does not have insomnia.    Performance status (ECOG): 3  Vitals Blood pressure (!) 121/54, pulse 78, temperature (!) 97.4 F (36.3 C), temperature source Tympanic, resp. rate 16, weight 232 lb 0.6 oz (105.3 kg), SpO2 100 %.  Physical Exam  Constitutional: He is oriented to person, place, and time. No distress.  Slightly fatigued appearing gentleman sitting comfortably in a wheelchair in no acute distress.  HENT:  Head: Normocephalic.  Mouth/Throat: Oropharynx is clear and moist. No oropharyngeal exudate.  Cap.  Full beard.  Mask.  Eyes: Pupils are equal, round, and reactive to light. Conjunctivae and EOM are normal. No scleral icterus.  Brown eyes.  Neck: No JVD present.  Cardiovascular: Normal rate and normal heart sounds. Exam reveals no gallop.  No murmur heard. Pulmonary/Chest: Effort normal and breath sounds normal. No respiratory distress. He has no wheezes. He has no rales.  Abdominal: Soft. Bowel sounds are normal. He exhibits no distension and no mass. There is no abdominal tenderness. There is no rebound and no guarding.  Musculoskeletal:        General: Tenderness and edema (2+ chronic) present. Normal range of motion.     Cervical back: Normal range of motion and neck supple.  Lymphadenopathy:       Head (right side): No preauricular, no posterior auricular and no occipital adenopathy present.       Head (left side): No preauricular, no posterior auricular and no occipital adenopathy present.    He has no cervical adenopathy.    He has no axillary adenopathy.       Right: No inguinal and no supraclavicular adenopathy present.       Left: No inguinal and no supraclavicular adenopathy present.  Neurological: He is alert and oriented to person, place, and time.   Skin: Skin is warm and dry. No rash noted. He is not diaphoretic. No erythema. No pallor.  Psychiatric: He has a normal mood and affect. His behavior is normal. Judgment and thought content normal.  Nursing note and vitals reviewed.   Appointment on 12/09/2019  Component Date Value Ref Range Status  . Sodium 12/09/2019 128* 135 - 145 mmol/L Final  . Potassium 12/09/2019 4.1  3.5 - 5.1 mmol/L Final  . Chloride 12/09/2019 94* 98 - 111 mmol/L Final  . CO2 12/09/2019 24  22 - 32 mmol/L Final  . Glucose, Bld 12/09/2019 231* 70 - 99 mg/dL Final  . BUN 12/09/2019 20  8 - 23 mg/dL Final  . Creatinine, Ser 12/09/2019 1.62* 0.61 - 1.24 mg/dL Final  . Calcium 12/09/2019 8.9  8.9 - 10.3 mg/dL Final  . GFR calc non Af Amer 12/09/2019 38* >60 mL/min Final  . GFR calc Af Amer 12/09/2019 44* >60 mL/min Final  . Anion gap 12/09/2019 10  5 - 15 Final   Performed at [facility], [address], [place], [place] [address]  Admission on 12/08/2019, Discharged on 12/08/2019  Component Date Value Ref Range Status  . Glucose-Capillary 12/08/2019 76  70 - 99 mg/dL Final  . WBC 12/08/2019 7.7  4.0 - 10.5 K/uL Final  . RBC 12/08/2019 3.99* 4.22 - 5.81 MIL/uL Final  . Hemoglobin 12/08/2019 11.0* 13.0 - 17.0 g/dL Final  . HCT 12/08/2019 35.0* 39.0 - 52.0 % Final  . MCV 12/08/2019 87.7  80.0 - 100.0 fL Final  . MCH 12/08/2019 27.6  26.0 - 34.0 pg Final  . MCHC 12/08/2019 31.4  30.0 - 36.0 g/dL Final  . RDW 12/08/2019 16.6* 11.5 - 15.5 % Final  . Platelets 12/08/2019 249  150 - 400 K/uL Final  . nRBC 12/08/2019 0.0  0.0 - 0.2 % Final  . Neutrophils Relative % 12/08/2019 66  % Final  . Neutro Abs  12/08/2019 5.1  1.7 - 7.7 K/uL Final  . Lymphocytes Relative 12/08/2019 21  % Final  . Lymphs Abs 12/08/2019 1.6  0.7 - 4.0 K/uL Final  . Monocytes Relative 12/08/2019 7  % Final  . Monocytes Absolute 12/08/2019 0.6  0.1 - 1.0 K/uL Final  . Eosinophils Relative 12/08/2019 5  % Final  . Eosinophils  Absolute 12/08/2019 0.4  0.0 - 0.5 K/uL Final  . Basophils Relative 12/08/2019 1  % Final  . Basophils Absolute 12/08/2019 0.1  0.0 - 0.1 K/uL Final  . Immature Granulocytes 12/08/2019 0  % Final  . Abs Immature Granulocytes 12/08/2019 0.03  0.00 - 0.07 K/uL Final   Performed at [facility], [address] [place], [place] [address]  . Glucose-Capillary 12/08/2019 54* 70 - 99 mg/dL Final  . Glucose-Capillary 12/08/2019 130* 70 - 99 mg/dL Final  . Comment 1 12/08/2019 Document in Chart   Final  . Glucose-Capillary 12/08/2019 74  70 - 99 mg/dL Final  . Sodium 12/08/2019 133* 135 - 145 mmol/L Final  . Potassium 12/08/2019 4.0  3.5 - 5.1 mmol/L Final  . Chloride 12/08/2019 98  98 - 111 mmol/L Final  . CO2 12/08/2019 24  22 - 32 mmol/L Final  . Glucose, Bld 12/08/2019 96  70 - 99 mg/dL Final  . BUN 12/08/2019 13  8 - 23 mg/dL Final  . Creatinine, Ser 12/08/2019 1.33* 0.61 - 1.24 mg/dL Final  . Calcium 12/08/2019 9.5  8.9 - 10.3 mg/dL Final  . Total Protein 12/08/2019 7.4  6.5 - 8.1 g/dL Final  . Albumin 12/08/2019 3.7  3.5 - 5.0 g/dL Final  . AST 12/08/2019 28  15 - 41 U/L Final  . ALT 12/08/2019 33  0 - 44 U/L Final  . Alkaline Phosphatase 12/08/2019 343* 38 - 126 U/L Final  . Total Bilirubin 12/08/2019 0.7  0.3 - 1.2 mg/dL Final  . GFR calc non Af Amer 12/08/2019 48* >60 mL/min Final  . GFR calc Af Amer 12/08/2019 56* >60 mL/min Final  . Anion gap 12/08/2019 11  5 - 15 Final   Performed at [facility], [address], [place], [place] [address]  . Magnesium 12/08/2019 2.0  1.7 - 2.4 mg/dL Final   Performed at [facility], [address] [place], [place] [address]  . Glucose-Capillary 12/08/2019 78  70 - 99 mg/dL Final  . Comment 1 12/08/2019 Document in Chart   Final  . Glucose-Capillary 12/08/2019 71  70 - 99 mg/dL Final  . Comment 1 12/08/2019 Document in Chart   Final    Assessment:  [name] is a 86 y.o. male with metastatic prostate  cancer.  He presented with gross hematuria. PSAwas 2241 on 11/06/2019.   Renal stone CTon 11/05/2019 revealed confluentnodular massesadjacent to the prostate and distal sigmoid colon with left pelvic, perirectal, retroperitoneal, and mesenteric adenopathy as well as probable peritoneal diseasemost marked in the right upper quadrant extending about the right diaphragmatic leaflet. There was pelvic and suspected vertebral osseous metastatic diseasewith L1 compression fracture, likely pathologic.There was a possible 2 cm right lower lobe lung nodule.There was left hydronephrosisand proximal ureterectasis secondary to pelvic process (prior renal ultrasound on 10/16/2019 revealed no hydronephrosis).  Chest CT on 11/08/2019 revealed right pleural effusion with pleural metastasis (2.7 x 2.1 cm nodularity along anterior right hemidiaphragm).  Osseous metastasis, including a dominant lesion involving the right-side of the L2 vertebral body.   Bone scan on 11/08/2019 revealed widespread bony metastasis (  right occiput, clavicles, left upper extremity, sternum, ribs bilaterally, throughout the cervical, thoracic, lumbar spine, bony pelvis, proximal femurs, right tibia).   Patient declined biopsy for NGS.  He received degarelix on 11/11/2019.  He began abiraterone on 11/14/2019.  PSA has been followed:  32.82 on 12/22/2018, 33.08 on 12/30/2018, 122.41 on 05/27/2019, 2241 on 11/06/2019, and 216 on 12/09/2019.  He has a monoclonal gammopathy.  SPEP on 10/16/2019 revealed a 0.4 gm/dL + an additional 0.2 gm/dL biclonal IgA protein with kappa specificity.  Bone marrow on 11/14/2019 revealed a hypercellular bone marrow for age with metastatic carcinoma c/w prostate cancer.  There was slight plasmacytosis (4% plasma cells).  Plasma cells displayed polyclonal staining for kappa and lambda light chains with kappa light chain excess.  Findings were most suggestive of early involvement by a plasma cell  neoplasm.  Cytogenetics were normal (39, XX).  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  He has a history of DVTin the upper deep calf vein in the region of the venous trifurcation on 07/04/2019.He was on Eliquis, but discontinued secondary to hematuria.  IVC filter was placed on 11/07/2019.  He has stage IIIa chronic kidney disease.  Creatinine is 1.4.  Symptomatically, he is doing fair.  He denies any bone pain.  Hematuria has resolved.  He has lost 22 pounds since discharge from the hospital.  He is eating better since discharge from rehab on 12/05/2019.  Sodium is 128.  Plan: 1.Labs today:  CBC with diff, CMP, PSA, ferritin, iron studies, sed rate. 2.   Metastatic prostatic prostate cancer Patient has extensive pelvic, perirectal, retroperitoneal and mesenteric adenopathy. Patient has extensive osseous metastasis. Suspect pleural based nodularity is prostate cancer. No currrent intervention for  L1 pathologic compression as patient is asymptomatic. Patient received degarelix on 11/11/2019.   Continue degarelix monthly (due today).             Abiraterone and prednisone began on 11/14/2019.   Patient tolerating well without side effects.             Continue monthly degarelix and daily abiraterone. 3.   Bone metastasis  Discuss Xgeva vs Zometa  Potential side effects reviewed.  Discuss dental clearance.  Samara Deist.  Information provided. 4.Hematuria Patient was previously admitted with gross hematuria.   No further hematuria per patient.  Aspirin, Plavix, and Eliquis held. Patient s/p IVC filter placement on 11/07/2019.             Anticipate follow-up in urology.  Continue to monitor. 5.Normocytic anemia             Hematocrit 25.3.  Hemoglobin 7.8.  MCV 87.5 on 11/15/2019.  Hematocrit 35.0.  Hemoglobin 11.0.  MCV 87.7 on 12/08/2019.             Hemoglobin has improved  with resolution of hematuira. Patient is s/p 1 unit of PRBCs on 11/05/2019. Ferritin 549 (acute phase reactant) with iron saturation 15% and TIBC 190.             B12 and folate were normal on 11/08/2019. 6.   Monoclonal gammopathy             SPEP on 10/16/2019 revealed a 0.4 gm/dL + an additional 0.2 gm/dL biclonal IgA protein with kappa specificity.             Discuss monoclonal gammopathy of unknown significance (MGUS) vs multiple myeloma (MM).  CRAB criteria- hypercalcemia, renal disease, anemia, bone lesions.                                     Calcium is normal.  Anemia is limproving.  Creatinine 1.62.                                       Some bone lesions are blastic (typical for prostate cancer) and some lytic (more typical for myeloma).             Bone marrow aspirate and biopsyon 11/15/2019 revealed a hypercellular bone marrow with metastatic carcinoma c/w prostate cancer.     There was slight plasmacytosis (4% plasma cells).  Plasma cells displayed polyclonal staining for kappa and lambda light chains with kappa light chain excess.     Findings were most suggestive of early involvement by a plasma cell neoplasm.     Cytogenetics were normal (23, XX).  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  Discuss plan for close monitoring at this time. 7.    Left lower extremity DVT  Duplex on 07/04/2019 revealed upper deep calf vein in the region of the venous trifurcation.  Aspirin, Plavix, and Eliquis held secondary to hematuria.  IVC filter was placed on 11/07/2019.   Exam reveals chronic lower extremity edema.  Continue to monitor. 8.   Weight loss  Nutrition consult.  Discuss importance of caloric intake. 9.   Degarelix today. 10.   FAX chemistries to PCP and call RN re: chemistries. 11.   RTC in 2 weeks for MD assessment and labs (BMP) +/- Xgeva.  I discussed the assessment and treatment plan with the patient.  The patient was  provided an opportunity to ask questions and all were answered.  The patient agreed with the plan and demonstrated an understanding of the instructions.  The patient was advised to call back if the symptoms worsen or if the condition fails to improve as anticipated.  I provided 20 minutes (2:30 PM - 2:50 PM) of face-to-face time during this this encounter and > 50% was spent counseling as documented under my assessment and plan.     C. Mike Gip, MD, PhD    12/09/2019, 2:50 PM  I, Samul Dada, am acting as scribe for [employer]. Mike Gip, MD, PhD.  I,  C. Mike Gip, MD, have reviewed the above documentation for accuracy and completeness, and I agree with the above.

## 2019-12-08 NOTE — ED Notes (Signed)
Touched base with patient's daughter, blood sugar has been consistently in 70s for a couple hours, patient's mentation is much better. Patient declines most food offered but educated patient that he will have to come back to the ER if he doesn't eat and maintain his blood sugar. Patient agreed

## 2019-12-08 NOTE — ED Provider Notes (Signed)
[facility] Emergency Department Provider Note  ____________________________________________   First MD Initiated Contact with Patient 12/08/19 0031     (approximate)  I have reviewed the triage vital signs and the nursing notes.   HISTORY  Chief Complaint Hypoglycemia   Level 5 caveat:  history/ROS limited by unwillingness or inability to cooperate and/or acute medical illness.   HPI [name] is a 86 y.o. male with medical history as listed below who presents by EMS for altered mental status thought to be due to hypoglycemia.  His daughter cares for him at home and fed him a fruit cup tonight for dinner and he took his regular amount of insulin, but then he became less responsive than usual.  His daughter checked his glucose and found that it is very low.  He reportedly has a decreased appetite and has metastatic cancer which is apparently primarily prostatic.  He recently went home from [facility] within the last couple of days.  It appears that he is followed by the [facility] cancer center, specifically [name], for chemotherapy, based on the medical record.  The patient is not able to provide any history.  When I ask him if he is in pain, he slowly shook his head no but said he was cold.  He slowly drank a glass of juice while I was in the room but refused a [place] sandwich because he said he does not like [place].  He could not give me any additional history.          Past Medical History:  Diagnosis Date  . Anemia, normocytic normochromic   . BPH (benign prostatic hyperplasia)   . CAD (coronary artery disease)   . Cataracts, bilateral   . Chronic renal insufficiency   . Colon polyp   . COPD (chronic obstructive pulmonary disease) ([place])   . Diabetes mellitus without complication ([place])   . DVT (deep venous thrombosis) ([place])   . Edema of both legs   . Glaucoma   . Gout   . Heart murmur   . Hepatitis C antibody test positive  10/20/2019  . Hyperlipidemia   . Hypertension   . Moderate persistent asthma without complication   . Myocardial infarction ([place])   . Neuromuscular disorder ([place])   . Neuropathy   . Peripheral vascular disease ([place])   . Premature ejaculation   . Prostate cancer ([place])   . RLS (restless legs syndrome)   . Sleep apnea   . Stented coronary artery 11/13/2015  . Uncontrolled type 2 diabetes mellitus with stage 3 chronic kidney disease, with long-term current use of insulin [facility])     Patient Active Problem List   Diagnosis Date Noted  . Monoclonal gammopathy   . Palliative care by specialist   . Goals of care, counseling/discussion   . Prostate cancer metastatic to bone ([place]) 11/07/2019  . Elevated INR 11/05/2019  . Acute blood loss anemia 11/05/2019  . OSA on CPAP 11/05/2019  . Dyslipidemia 11/05/2019  . Morbid obesity ([place]) 11/05/2019  . DVT (deep venous thrombosis) ([place]) 11/05/2019  . Hematuria 11/04/2019  . Hepatitis C antibody test positive 10/20/2019  . Anasarca 10/15/2019  . Acute exacerbation of CHF (congestive heart failure) ([place]) 10/15/2019  . CAD (coronary artery disease) 10/15/2019  . Insulin dependent type 2 diabetes mellitus ([place]) 10/15/2019  . Essential hypertension 10/15/2019  . Chronic anticoagulation 10/15/2019  . Acute kidney injury superimposed on CKD ([place]) 10/15/2019  . CKD (chronic kidney disease) stage  3, GFR 30-59 ml/min 10/15/2019  . Generalized weakness 10/15/2019  . Frequent falls 10/15/2019  . Elevated liver enzymes 10/15/2019  . Chronic diastolic CHF (congestive heart failure) ([place]) 10/15/2019  . Elevated LFTs     Past Surgical History:  Procedure Laterality Date  . APPENDECTOMY    . [place]  2009  . COLONOSCOPY  2012  . EYE SURGERY    . IVC FILTER INSERTION N/A 11/07/2019   Procedure: IVC FILTER INSERTION;  Surgeon: Algernon Huxley, MD;  Location: [place] [facility];  Service: Cardiovascular;  Laterality: N/A;  . PROSTATE SURGERY     . TONSILLECTOMY      Prior to Admission medications   Medication Sig Start Date End Date Taking? Authorizing Provider  abiraterone acetate (ZYTIGA) 250 MG tablet TAKE 4 TABLETS (1,000 MG TOTAL) BY MOUTH DAILY. TAKE ON AN EMPTY STOMACH 1 HOUR BEFORE OR 2 HOURS AFTER A MEAL 12/06/19   Lequita Asal, MD  atorvastatin (LIPITOR) 80 MG tablet Take 80 mg by mouth daily. 11/15/15   [provider]  carvedilol (COREG) 25 MG tablet Take 25 mg by mouth 2 (two) times daily with a meal.    [provider]  Cholecalciferol (VITAMIN D) 50 MCG (2000 UT) tablet Take 2,000 Units by mouth daily.     [provider]  DULoxetine (CYMBALTA) 60 MG capsule Take 60 mg by mouth daily.    [provider]  famotidine (PEPCID) 20 MG tablet Take 20 mg by mouth at bedtime as needed for heartburn.  08/26/19 08/25/20  [provider]  feeding supplement, ENSURE ENLIVE, (ENSURE ENLIVE) LIQD Take 237 mLs by mouth 4 (four) times daily -  with meals and at bedtime. 11/17/19   Fritzi Mandes, MD  finasteride (PROSCAR) 5 MG tablet Take 5 mg by mouth daily. 05/22/14   [provider]  insulin aspart (NOVOLOG) 100 UNIT/ML injection Inject 5 Units into the skin 3 (three) times daily with meals. 11/17/19   Fritzi Mandes, MD  Insulin Detemir (LEVEMIR) 100 UNIT/ML Pen Inject 15 Units into the skin daily. 11/17/19   Fritzi Mandes, MD  Multiple Vitamin (MULTI-VITAMIN) tablet Take 1 tablet by mouth daily.    [provider]  nitroGLYCERIN (NITROSTAT) 0.4 MG SL tablet Place 0.4 mg under the tongue every 5 (five) minutes as needed for chest pain.     [provider]  oxyCODONE-acetaminophen (PERCOCET/ROXICET) 5-325 MG tablet Take 1 tablet by mouth every 8 (eight) hours as needed for moderate pain. 11/17/19   Fritzi Mandes, MD  predniSONE (DELTASONE) 5 MG tablet TAKE 1 TABLET BY MOUTH DAILY WITH BREAKFAST. BEGIN TAKING WITH INITIATION OF ABIRATERONE. 12/06/19   Lequita Asal, MD     Allergies Metformin and related  History reviewed. No pertinent family history.  Social History Social History   Tobacco Use  . Smoking status: Former Research scientist (life sciences)  . Smokeless tobacco: Never Used  Substance Use Topics  . Alcohol use: Not on file  . Drug use: Not on file    Review of Systems Level 5 caveat:  history/ROS limited by unwillingness or inability to cooperate and/or acute medical illness.  Altered mental status in the setting of hypoglycemia, insulin use, decreased appetite.   ____________________________________________   PHYSICAL EXAM:  VITAL SIGNS: ED Triage Vitals  Enc Vitals Group     BP 12/08/19 0016 136/71     Pulse Rate 12/08/19 0016 (!) 59     Resp 12/08/19 0016 16  Temp --      Temp src --      SpO2 12/08/19 0013 99 %     Weight 12/08/19 0017 115 kg (253 lb 8.5 oz)     Height 12/08/19 0017 1.803 m (5\' 11" )     Head Circumference --      Peak Flow --      Pain Score --      Pain Loc --      Pain Edu? --      Excl. in [place]? --     Constitutional: Awake and alert but slow to respond.  No acute distress. Eyes: Conjunctivae are normal.  Head: Atraumatic. Nose: No congestion/rhinnorhea. Mouth/Throat: Patient is wearing a mask. Neck: No stridor.  No meningeal signs.   Cardiovascular: Normal rate, regular rhythm. Good peripheral circulation. Grossly normal heart sounds. Respiratory: Normal respiratory effort.  No retractions. Gastrointestinal: Soft and nontender. No distention.  Musculoskeletal: No lower extremity tenderness nor edema. No gross deformities of extremities. Neurologic: Very slow speech and language but responds to simple questions.  Moving all 4 extremities slowly but without difficulty. Skin:  Skin is warm, dry and intact. Psychiatric: Mood and affect are flat.   ____________________________________________   LABS (all labs ordered are listed, but only abnormal results are displayed)  Labs Reviewed  CBC WITH  DIFFERENTIAL/PLATELET - Abnormal; Notable for the following components:      Result Value   RBC 3.99 (*)    Hemoglobin 11.0 (*)    HCT 35.0 (*)    RDW 16.6 (*)    All other components within normal limits  GLUCOSE, CAPILLARY - Abnormal; Notable for the following components:   Glucose-Capillary 54 (*)    All other components within normal limits  GLUCOSE, CAPILLARY - Abnormal; Notable for the following components:   Glucose-Capillary 130 (*)    All other components within normal limits  COMPREHENSIVE METABOLIC PANEL - Abnormal; Notable for the following components:   Sodium 133 (*)    Creatinine, Ser 1.33 (*)    Alkaline Phosphatase 343 (*)    GFR calc non Af Amer 48 (*)    GFR calc Af Amer 56 (*)    All other components within normal limits  GLUCOSE, CAPILLARY  GLUCOSE, CAPILLARY  MAGNESIUM  GLUCOSE, CAPILLARY  GLUCOSE, CAPILLARY  URINALYSIS, ROUTINE W REFLEX MICROSCOPIC   ____________________________________________  EKG  ED ECG REPORT I, Hinda Kehr, the attending physician, personally viewed and interpreted this ECG.  Date: 12/08/2019 EKG Time: 00: 15 Rate: 59 Rhythm: borderline sinus bradycardia QRS Axis: normal Intervals: normal ST/T Wave abnormalities: Non-specific ST segment / T-wave changes, but no clear evidence of acute ischemia. Narrative Interpretation: no definitive evidence of acute ischemia; does not meet STEMI criteria.   ____________________________________________  RADIOLOGY I, Hinda Kehr, personally viewed and evaluated these images (plain radiographs) as part of my medical decision making, as well as reviewing the written report by the radiologist.  ED MD interpretation:  No indication for emergent imaging  Official radiology report(s): No results found.  ____________________________________________   PROCEDURES   Procedure(s) performed (including Critical  Care):  Procedures   ____________________________________________   INITIAL IMPRESSION / MDM / [place] / ED COURSE  As part of my medical decision making, I reviewed the following data within the [facility] notes reviewed and incorporated, Labs reviewed , Old chart reviewed, Notes from prior ED visits and [place] Controlled Substance Database   Differential diagnosis includes, but is not limited  to, hypoglycemia in the setting of insufficient p.o. intake, electrolyte or metabolic abnormality, acute infection, acute complications of his metastatic cancer.  I will check basic labs and continue to evaluate.  He drank a glass of orange juice but is refusing food.  We may need to administer IV dextrose but we will continue to monitor.  Patient is in no distress at this time.      Clinical Course as of Dec 08 339  Thu Dec 08, 2019  0136 Reassuring CBC  CBC with Differential/PlateletMarland Kitchen [CF]  5573 Patient mentating well now, denies pain and discomfort.  Tolerating oral intake but does not want more than some graham crackers and peanut butter along with orange juice.  Glucose has remained stable over the last couple of hours. Daughter comfortable with plan for discharge.   [CF]    Clinical Course User Index [CF] Hinda Kehr, MD     ____________________________________________  FINAL CLINICAL IMPRESSION(S) / ED DIAGNOSES  Final diagnoses:  Hypoglycemia     MEDICATIONS GIVEN DURING THIS VISIT:  Medications  dextrose 50 % solution (50 mLs  Given 12/08/19 0106)     ED Discharge Orders    None      *Please note:  [name] was evaluated in Emergency Department on 12/08/2019 for the symptoms described in the history of present illness. He was evaluated in the context of the global COVID-19 pandemic, which necessitated consideration that the patient might be at risk for infection with the SARS-CoV-2 virus that causes COVID-19. Institutional  protocols and algorithms that pertain to the evaluation of patients at risk for COVID-19 are in a state of rapid change based on information released by regulatory bodies including the CDC and federal and state organizations. These policies and algorithms were followed during the patient's care in the ED.  Some ED evaluations and interventions may be delayed as a result of limited staffing during the pandemic.*  Note:  This document was prepared using Dragon voice recognition software and may include unintentional dictation errors.   Hinda Kehr, MD 12/08/19 [phone]

## 2019-12-09 ENCOUNTER — Inpatient Hospital Stay: Payer: Medicare Other

## 2019-12-09 ENCOUNTER — Encounter: Payer: Self-pay | Admitting: Hematology and Oncology

## 2019-12-09 ENCOUNTER — Other Ambulatory Visit: Payer: Self-pay | Admitting: Hematology and Oncology

## 2019-12-09 ENCOUNTER — Other Ambulatory Visit: Payer: Self-pay | Admitting: *Deleted

## 2019-12-09 ENCOUNTER — Inpatient Hospital Stay: Payer: Medicare Other | Attending: Hematology and Oncology | Admitting: Hematology and Oncology

## 2019-12-09 VITALS — BP 121/54 | HR 78 | Temp 97.4°F | Resp 16 | Wt 232.0 lb

## 2019-12-09 DIAGNOSIS — C61 Malignant neoplasm of prostate: Secondary | ICD-10-CM

## 2019-12-09 DIAGNOSIS — J449 Chronic obstructive pulmonary disease, unspecified: Secondary | ICD-10-CM | POA: Insufficient documentation

## 2019-12-09 DIAGNOSIS — D62 Acute posthemorrhagic anemia: Secondary | ICD-10-CM

## 2019-12-09 DIAGNOSIS — C7951 Secondary malignant neoplasm of bone: Secondary | ICD-10-CM | POA: Insufficient documentation

## 2019-12-09 DIAGNOSIS — Z794 Long term (current) use of insulin: Secondary | ICD-10-CM | POA: Insufficient documentation

## 2019-12-09 DIAGNOSIS — Z87891 Personal history of nicotine dependence: Secondary | ICD-10-CM | POA: Insufficient documentation

## 2019-12-09 DIAGNOSIS — I252 Old myocardial infarction: Secondary | ICD-10-CM | POA: Insufficient documentation

## 2019-12-09 DIAGNOSIS — I824Y1 Acute embolism and thrombosis of unspecified deep veins of right proximal lower extremity: Secondary | ICD-10-CM

## 2019-12-09 DIAGNOSIS — Z86718 Personal history of other venous thrombosis and embolism: Secondary | ICD-10-CM | POA: Insufficient documentation

## 2019-12-09 DIAGNOSIS — Z7189 Other specified counseling: Secondary | ICD-10-CM

## 2019-12-09 DIAGNOSIS — N1831 Chronic kidney disease, stage 3a: Secondary | ICD-10-CM

## 2019-12-09 DIAGNOSIS — E119 Type 2 diabetes mellitus without complications: Secondary | ICD-10-CM | POA: Insufficient documentation

## 2019-12-09 DIAGNOSIS — Z5111 Encounter for antineoplastic chemotherapy: Secondary | ICD-10-CM | POA: Insufficient documentation

## 2019-12-09 DIAGNOSIS — R634 Abnormal weight loss: Secondary | ICD-10-CM

## 2019-12-09 DIAGNOSIS — D649 Anemia, unspecified: Secondary | ICD-10-CM | POA: Insufficient documentation

## 2019-12-09 DIAGNOSIS — Z7952 Long term (current) use of systemic steroids: Secondary | ICD-10-CM | POA: Insufficient documentation

## 2019-12-09 DIAGNOSIS — R918 Other nonspecific abnormal finding of lung field: Secondary | ICD-10-CM | POA: Insufficient documentation

## 2019-12-09 DIAGNOSIS — N400 Enlarged prostate without lower urinary tract symptoms: Secondary | ICD-10-CM | POA: Insufficient documentation

## 2019-12-09 DIAGNOSIS — D472 Monoclonal gammopathy: Secondary | ICD-10-CM

## 2019-12-09 DIAGNOSIS — E871 Hypo-osmolality and hyponatremia: Secondary | ICD-10-CM | POA: Diagnosis not present

## 2019-12-09 DIAGNOSIS — Z79899 Other long term (current) drug therapy: Secondary | ICD-10-CM | POA: Insufficient documentation

## 2019-12-09 LAB — IRON AND TIBC
Iron: 29 ug/dL — ABNORMAL LOW (ref 45–182)
Saturation Ratios: 15 % — ABNORMAL LOW (ref 17.9–39.5)
TIBC: 190 ug/dL — ABNORMAL LOW (ref 250–450)
UIBC: 161 ug/dL

## 2019-12-09 LAB — BASIC METABOLIC PANEL
Anion gap: 10 (ref 5–15)
BUN: 20 mg/dL (ref 8–23)
CO2: 24 mmol/L (ref 22–32)
Calcium: 8.9 mg/dL (ref 8.9–10.3)
Chloride: 94 mmol/L — ABNORMAL LOW (ref 98–111)
Creatinine, Ser: 1.62 mg/dL — ABNORMAL HIGH (ref 0.61–1.24)
GFR calc Af Amer: 44 mL/min — ABNORMAL LOW (ref >60)
GFR calc non Af Amer: 38 mL/min — ABNORMAL LOW (ref >60)
Glucose, Bld: 231 mg/dL — ABNORMAL HIGH (ref 70–99)
Potassium: 4.1 mmol/L (ref 3.5–5.1)
Sodium: 128 mmol/L — ABNORMAL LOW (ref 135–145)

## 2019-12-09 LAB — PSA: Prostatic Specific Antigen: 216 ng/mL — ABNORMAL HIGH (ref 0.00–4.00)

## 2019-12-09 LAB — FERRITIN: Ferritin: 549 ng/mL — ABNORMAL HIGH (ref 24–336)

## 2019-12-09 MED ORDER — DEGARELIX ACETATE 80 MG ~~LOC~~ SOLR
80.0000 mg | Freq: Once | SUBCUTANEOUS | Status: AC
  Administered 2019-12-09: 80 mg via SUBCUTANEOUS

## 2019-12-09 NOTE — Progress Notes (Unsigned)
nutritio

## 2019-12-09 NOTE — Patient Instructions (Signed)
Denosumab injection What is this medicine? DENOSUMAB (den oh sue mab) slows bone breakdown. Prolia is used to treat osteoporosis in women after menopause and in men, and in people who are taking corticosteroids for 6 months or more. Xgeva is used to treat a high calcium level due to cancer and to prevent bone fractures and other bone problems caused by multiple myeloma or cancer bone metastases. Xgeva is also used to treat giant cell tumor of the bone. This medicine may be used for other purposes; ask your health care provider or pharmacist if you have questions. COMMON BRAND NAME(S): Prolia, XGEVA What should I tell my health care provider before I take this medicine? They need to know if you have any of these conditions:  dental disease  having surgery or tooth extraction  infection  kidney disease  low levels of calcium or Vitamin D in the blood  malnutrition  on hemodialysis  skin conditions or sensitivity  thyroid or parathyroid disease  an unusual reaction to denosumab, other medicines, foods, dyes, or preservatives  pregnant or trying to get pregnant  breast-feeding How should I use this medicine? This medicine is for injection under the skin. It is given by a health care professional in a hospital or clinic setting. A special MedGuide will be given to you before each treatment. Be sure to read this information carefully each time. For Prolia, talk to your pediatrician regarding the use of this medicine in children. Special care may be needed. For Xgeva, talk to your pediatrician regarding the use of this medicine in children. While this drug may be prescribed for children as young as 13 years for selected conditions, precautions do apply. Overdosage: If you think you have taken too much of this medicine contact a poison control center or emergency room at once. NOTE: This medicine is only for you. Do not share this medicine with others. What if I miss a dose? It is  important not to miss your dose. Call your doctor or health care professional if you are unable to keep an appointment. What may interact with this medicine? Do not take this medicine with any of the following medications:  other medicines containing denosumab This medicine may also interact with the following medications:  medicines that lower your chance of fighting infection  steroid medicines like prednisone or cortisone This list may not describe all possible interactions. Give your health care provider a list of all the medicines, herbs, non-prescription drugs, or dietary supplements you use. Also tell them if you smoke, drink alcohol, or use illegal drugs. Some items may interact with your medicine. What should I watch for while using this medicine? Visit your doctor or health care professional for regular checks on your progress. Your doctor or health care professional may order blood tests and other tests to see how you are doing. Call your doctor or health care professional for advice if you get a fever, chills or sore throat, or other symptoms of a cold or flu. Do not treat yourself. This drug may decrease your body's ability to fight infection. Try to avoid being around people who are sick. You should make sure you get enough calcium and vitamin D while you are taking this medicine, unless your doctor tells you not to. Discuss the foods you eat and the vitamins you take with your health care professional. See your dentist regularly. Brush and floss your teeth as directed. Before you have any dental work done, tell your dentist you are   receiving this medicine. Do not become pregnant while taking this medicine or for 5 months after stopping it. Talk with your doctor or health care professional about your birth control options while taking this medicine. Women should inform their doctor if they wish to become pregnant or think they might be pregnant. There is a potential for serious side  effects to an unborn child. Talk to your health care professional or pharmacist for more information. What side effects may I notice from receiving this medicine? Side effects that you should report to your doctor or health care professional as soon as possible:  allergic reactions like skin rash, itching or hives, swelling of the face, lips, or tongue  bone pain  breathing problems  dizziness  jaw pain, especially after dental work  redness, blistering, peeling of the skin  signs and symptoms of infection like fever or chills; cough; sore throat; pain or trouble passing urine  signs of low calcium like fast heartbeat, muscle cramps or muscle pain; pain, tingling, numbness in the hands or feet; seizures  unusual bleeding or bruising  unusually weak or tired Side effects that usually do not require medical attention (report to your doctor or health care professional if they continue or are bothersome):  constipation  diarrhea  headache  joint pain  loss of appetite  muscle pain  runny nose  tiredness  upset stomach This list may not describe all possible side effects. Call your doctor for medical advice about side effects. You may report side effects to FDA at 1-800-FDA-1088. Where should I keep my medicine? This medicine is only given in a clinic, doctor's office, or other health care setting and will not be stored at home. NOTE: This sheet is a summary. It may not cover all possible information. If you have questions about this medicine, talk to your doctor, pharmacist, or health care provider.  2020 Elsevier/Gold Standard (2018-02-12 16:10:44)

## 2019-12-11 ENCOUNTER — Inpatient Hospital Stay
Admission: EM | Admit: 2019-12-11 | Discharge: 2019-12-15 | DRG: 641 | Disposition: A | Discharge status: Skilled Nursing Facility | Payer: Medicare Other | Attending: Internal Medicine | Admitting: Internal Medicine

## 2019-12-11 ENCOUNTER — Emergency Department: Payer: Medicare Other

## 2019-12-11 ENCOUNTER — Other Ambulatory Visit: Payer: Self-pay

## 2019-12-11 DIAGNOSIS — Z87891 Personal history of nicotine dependence: Secondary | ICD-10-CM

## 2019-12-11 DIAGNOSIS — Z7952 Long term (current) use of systemic steroids: Secondary | ICD-10-CM

## 2019-12-11 DIAGNOSIS — D472 Monoclonal gammopathy: Secondary | ICD-10-CM | POA: Diagnosis present

## 2019-12-11 DIAGNOSIS — J4540 Moderate persistent asthma, uncomplicated: Secondary | ICD-10-CM | POA: Diagnosis present

## 2019-12-11 DIAGNOSIS — Z86718 Personal history of other venous thrombosis and embolism: Secondary | ICD-10-CM

## 2019-12-11 DIAGNOSIS — K5900 Constipation, unspecified: Secondary | ICD-10-CM | POA: Diagnosis present

## 2019-12-11 DIAGNOSIS — D631 Anemia in chronic kidney disease: Secondary | ICD-10-CM | POA: Diagnosis present

## 2019-12-11 DIAGNOSIS — Z6831 Body mass index (BMI) 31.0-31.9, adult: Secondary | ICD-10-CM

## 2019-12-11 DIAGNOSIS — G4733 Obstructive sleep apnea (adult) (pediatric): Secondary | ICD-10-CM | POA: Diagnosis present

## 2019-12-11 DIAGNOSIS — Z888 Allergy status to other drugs, medicaments and biological substances status: Secondary | ICD-10-CM

## 2019-12-11 DIAGNOSIS — J449 Chronic obstructive pulmonary disease, unspecified: Secondary | ICD-10-CM | POA: Diagnosis present

## 2019-12-11 DIAGNOSIS — R319 Hematuria, unspecified: Secondary | ICD-10-CM | POA: Diagnosis present

## 2019-12-11 DIAGNOSIS — N390 Urinary tract infection, site not specified: Secondary | ICD-10-CM

## 2019-12-11 DIAGNOSIS — E1165 Type 2 diabetes mellitus with hyperglycemia: Secondary | ICD-10-CM | POA: Diagnosis present

## 2019-12-11 DIAGNOSIS — I824Y2 Acute embolism and thrombosis of unspecified deep veins of left proximal lower extremity: Secondary | ICD-10-CM | POA: Diagnosis present

## 2019-12-11 DIAGNOSIS — Z79899 Other long term (current) drug therapy: Secondary | ICD-10-CM

## 2019-12-11 DIAGNOSIS — Z20822 Contact with and (suspected) exposure to covid-19: Secondary | ICD-10-CM | POA: Diagnosis present

## 2019-12-11 DIAGNOSIS — E871 Hypo-osmolality and hyponatremia: Principal | ICD-10-CM

## 2019-12-11 DIAGNOSIS — I82409 Acute embolism and thrombosis of unspecified deep veins of unspecified lower extremity: Secondary | ICD-10-CM | POA: Diagnosis present

## 2019-12-11 DIAGNOSIS — M8448XA Pathological fracture, other site, initial encounter for fracture: Secondary | ICD-10-CM | POA: Diagnosis present

## 2019-12-11 DIAGNOSIS — E785 Hyperlipidemia, unspecified: Secondary | ICD-10-CM | POA: Diagnosis present

## 2019-12-11 DIAGNOSIS — Z955 Presence of coronary angioplasty implant and graft: Secondary | ICD-10-CM

## 2019-12-11 DIAGNOSIS — Z8546 Personal history of malignant neoplasm of prostate: Secondary | ICD-10-CM

## 2019-12-11 DIAGNOSIS — I5032 Chronic diastolic (congestive) heart failure: Secondary | ICD-10-CM | POA: Diagnosis present

## 2019-12-11 DIAGNOSIS — E1151 Type 2 diabetes mellitus with diabetic peripheral angiopathy without gangrene: Secondary | ICD-10-CM | POA: Diagnosis present

## 2019-12-11 DIAGNOSIS — R296 Repeated falls: Secondary | ICD-10-CM | POA: Diagnosis present

## 2019-12-11 DIAGNOSIS — Z95828 Presence of other vascular implants and grafts: Secondary | ICD-10-CM

## 2019-12-11 DIAGNOSIS — C7951 Secondary malignant neoplasm of bone: Secondary | ICD-10-CM | POA: Diagnosis present

## 2019-12-11 DIAGNOSIS — E878 Other disorders of electrolyte and fluid balance, not elsewhere classified: Secondary | ICD-10-CM | POA: Diagnosis present

## 2019-12-11 DIAGNOSIS — Z794 Long term (current) use of insulin: Secondary | ICD-10-CM

## 2019-12-11 DIAGNOSIS — R2681 Unsteadiness on feet: Secondary | ICD-10-CM | POA: Diagnosis present

## 2019-12-11 DIAGNOSIS — I130 Hypertensive heart and chronic kidney disease with heart failure and stage 1 through stage 4 chronic kidney disease, or unspecified chronic kidney disease: Secondary | ICD-10-CM | POA: Diagnosis present

## 2019-12-11 DIAGNOSIS — E119 Type 2 diabetes mellitus without complications: Secondary | ICD-10-CM

## 2019-12-11 DIAGNOSIS — Z9181 History of falling: Secondary | ICD-10-CM

## 2019-12-11 DIAGNOSIS — Z9989 Dependence on other enabling machines and devices: Secondary | ICD-10-CM

## 2019-12-11 DIAGNOSIS — E7800 Pure hypercholesterolemia, unspecified: Secondary | ICD-10-CM | POA: Diagnosis present

## 2019-12-11 DIAGNOSIS — D630 Anemia in neoplastic disease: Secondary | ICD-10-CM | POA: Diagnosis present

## 2019-12-11 DIAGNOSIS — I10 Essential (primary) hypertension: Secondary | ICD-10-CM | POA: Diagnosis present

## 2019-12-11 DIAGNOSIS — R531 Weakness: Secondary | ICD-10-CM

## 2019-12-11 DIAGNOSIS — H409 Unspecified glaucoma: Secondary | ICD-10-CM | POA: Diagnosis present

## 2019-12-11 DIAGNOSIS — N1831 Chronic kidney disease, stage 3a: Secondary | ICD-10-CM | POA: Diagnosis present

## 2019-12-11 DIAGNOSIS — I252 Old myocardial infarction: Secondary | ICD-10-CM

## 2019-12-11 DIAGNOSIS — N400 Enlarged prostate without lower urinary tract symptoms: Secondary | ICD-10-CM | POA: Diagnosis present

## 2019-12-11 DIAGNOSIS — E1142 Type 2 diabetes mellitus with diabetic polyneuropathy: Secondary | ICD-10-CM | POA: Diagnosis present

## 2019-12-11 DIAGNOSIS — E1122 Type 2 diabetes mellitus with diabetic chronic kidney disease: Secondary | ICD-10-CM | POA: Diagnosis present

## 2019-12-11 DIAGNOSIS — Z7901 Long term (current) use of anticoagulants: Secondary | ICD-10-CM

## 2019-12-11 DIAGNOSIS — N183 Chronic kidney disease, stage 3 unspecified: Secondary | ICD-10-CM | POA: Diagnosis present

## 2019-12-11 DIAGNOSIS — I2510 Atherosclerotic heart disease of native coronary artery without angina pectoris: Secondary | ICD-10-CM | POA: Diagnosis present

## 2019-12-11 DIAGNOSIS — C61 Malignant neoplasm of prostate: Secondary | ICD-10-CM | POA: Diagnosis present

## 2019-12-11 LAB — CBC WITH DIFFERENTIAL/PLATELET
Abs Immature Granulocytes: 0.03 10*3/uL (ref 0.00–0.07)
Basophils Absolute: 0 10*3/uL (ref 0.0–0.1)
Basophils Relative: 0 %
Eosinophils Absolute: 0.2 10*3/uL (ref 0.0–0.5)
Eosinophils Relative: 2 %
HCT: 31.5 % — ABNORMAL LOW (ref 39.0–52.0)
Hemoglobin: 9.8 g/dL — ABNORMAL LOW (ref 13.0–17.0)
Immature Granulocytes: 0 %
Lymphocytes Relative: 10 %
Lymphs Abs: 0.8 10*3/uL (ref 0.7–4.0)
MCH: 27.4 pg (ref 26.0–34.0)
MCHC: 31.1 g/dL (ref 30.0–36.0)
MCV: 88 fL (ref 80.0–100.0)
Monocytes Absolute: 0.5 10*3/uL (ref 0.1–1.0)
Monocytes Relative: 6 %
Neutro Abs: 6.5 10*3/uL (ref 1.7–7.7)
Neutrophils Relative %: 82 %
Platelets: 230 10*3/uL (ref 150–400)
RBC: 3.58 MIL/uL — ABNORMAL LOW (ref 4.22–5.81)
RDW: 16.2 % — ABNORMAL HIGH (ref 11.5–15.5)
WBC: 8.1 10*3/uL (ref 4.0–10.5)
nRBC: 0 % (ref 0.0–0.2)

## 2019-12-11 LAB — COMPREHENSIVE METABOLIC PANEL
ALT: 24 U/L (ref 0–44)
AST: 20 U/L (ref 15–41)
Albumin: 3.4 g/dL — ABNORMAL LOW (ref 3.5–5.0)
Alkaline Phosphatase: 281 U/L — ABNORMAL HIGH (ref 38–126)
Anion gap: 11 (ref 5–15)
BUN: 17 mg/dL (ref 8–23)
CO2: 27 mmol/L (ref 22–32)
Calcium: 9.4 mg/dL (ref 8.9–10.3)
Chloride: 91 mmol/L — ABNORMAL LOW (ref 98–111)
Creatinine, Ser: 1.52 mg/dL — ABNORMAL HIGH (ref 0.61–1.24)
GFR calc Af Amer: 48 mL/min — ABNORMAL LOW (ref >60)
GFR calc non Af Amer: 41 mL/min — ABNORMAL LOW (ref >60)
Glucose, Bld: 387 mg/dL — ABNORMAL HIGH (ref 70–99)
Potassium: 4.6 mmol/L (ref 3.5–5.1)
Sodium: 129 mmol/L — ABNORMAL LOW (ref 135–145)
Total Bilirubin: 0.8 mg/dL (ref 0.3–1.2)
Total Protein: 7 g/dL (ref 6.5–8.1)

## 2019-12-11 LAB — GLUCOSE, CAPILLARY
Glucose-Capillary: 296 mg/dL — ABNORMAL HIGH (ref 70–99)
Glucose-Capillary: 381 mg/dL — ABNORMAL HIGH (ref 70–99)

## 2019-12-11 LAB — URINALYSIS, COMPLETE (UACMP) WITH MICROSCOPIC
Bilirubin Urine: NEGATIVE
Glucose, UA: >=500 mg/dL — AB
Hgb urine dipstick: NEGATIVE
Ketones, ur: NEGATIVE mg/dL
Nitrite: NEGATIVE
Protein, ur: NEGATIVE mg/dL
Specific Gravity, Urine: 1.006 (ref 1.005–1.030)
Squamous Epithelial / HPF: NONE SEEN (ref 0–5)
pH: 7 (ref 5.0–8.0)

## 2019-12-11 MED ORDER — SODIUM CHLORIDE 0.9 % IV BOLUS
1000.0000 mL | Freq: Once | INTRAVENOUS | Status: AC
  Administered 2019-12-11: 1000 mL via INTRAVENOUS

## 2019-12-11 MED ORDER — SODIUM CHLORIDE 0.9 % IV SOLN
1.0000 g | Freq: Once | INTRAVENOUS | Status: AC
  Administered 2019-12-11: 1 g via INTRAVENOUS
  Filled 2019-12-11: qty 10

## 2019-12-11 NOTE — ED Provider Notes (Signed)
[facility] Emergency Department Provider Note  ____________________________________________   First MD Initiated Contact with Patient 12/11/19 1942     (approximate)  I have reviewed the triage vital signs and the nursing notes.  History  Chief Complaint Fall    HPI [name] Sr. is a 86 y.o. male PMHx as below who presents for a recurrent falls and generalized weakness.   Patient recently came home from rehab on 2/15 and is living with his daughter. Since Friday he has fallen twice and daughter reported to EMS that he has had progressive generalized weakness, unsteadiness on his feet for at least the last several days.  Patient himself reports falling because he turned awkwardly in the restroom reaching for a towel, causing him to lose his balance. Denies preceding presyncopal symptoms, specifically denies any preceding chest pain, dizziness, lightheadedness, palpitations.  He states on Friday he fell and landed on his back, denies any pain related to this.  Today he landed on his left side, again denies any pain related to this.  Denies any head injuries or resultant loss of consciousness.  He is on blood thinning medication.  He does admit to decreased appetite, decreased intake for the last several days.   Past Medical Hx Past Medical History:  Diagnosis Date  . Anemia, normocytic normochromic   . BPH (benign prostatic hyperplasia)   . CAD (coronary artery disease)   . Cataracts, bilateral   . Chronic renal insufficiency   . Colon polyp   . COPD (chronic obstructive pulmonary disease) ([place])   . Diabetes mellitus without complication ([place])   . DVT (deep venous thrombosis) ([place])   . Edema of both legs   . Glaucoma   . Gout   . Heart murmur   . Hepatitis C antibody test positive 10/20/2019  . Hyperlipidemia   . Hypertension   . Moderate persistent asthma without complication   . Myocardial infarction ([place])   . Neuromuscular disorder  ([place])   . Neuropathy   . Peripheral vascular disease ([place])   . Premature ejaculation   . Prostate cancer ([place])   . RLS (restless legs syndrome)   . Sleep apnea   . Stented coronary artery 11/13/2015  . Uncontrolled type 2 diabetes mellitus with stage 3 chronic kidney disease, with long-term current use of insulin ([place])     Problem List Patient Active Problem List   Diagnosis Date Noted  . Monoclonal gammopathy   . Palliative care by specialist   . Goals of care, counseling/discussion   . Prostate cancer metastatic to bone ([place]) 11/07/2019  . Elevated INR 11/05/2019  . Acute blood loss anemia 11/05/2019  . OSA on CPAP 11/05/2019  . Dyslipidemia 11/05/2019  . Morbid obesity ([place]) 11/05/2019  . DVT (deep venous thrombosis) ([place]) 11/05/2019  . Hematuria 11/04/2019  . Hepatitis C antibody test positive 10/20/2019  . Anasarca 10/15/2019  . Acute exacerbation of CHF (congestive heart failure) ([place]) 10/15/2019  . CAD (coronary artery disease) 10/15/2019  . Insulin dependent type 2 diabetes mellitus ([place]) 10/15/2019  . Essential hypertension 10/15/2019  . Chronic anticoagulation 10/15/2019  . Acute kidney injury superimposed on CKD ([place]) 10/15/2019  . CKD (chronic kidney disease) stage 3, GFR 30-59 ml/min 10/15/2019  . Generalized weakness 10/15/2019  . Frequent falls 10/15/2019  . Elevated liver enzymes 10/15/2019  . Chronic diastolic CHF (congestive heart failure) ([place]) 10/15/2019  . Elevated LFTs     Past Surgical Hx Past Surgical History:  Procedure Laterality Date  .  APPENDECTOMY    . [place]  2009  . COLONOSCOPY  2012  . EYE SURGERY    . IVC FILTER INSERTION N/A 11/07/2019   Procedure: IVC FILTER INSERTION;  Surgeon: Algernon Huxley, MD;  Location: [facility];  Service: Cardiovascular;  Laterality: N/A;  . PROSTATE SURGERY    . TONSILLECTOMY      Medications Prior to Admission medications   Medication Sig Start Date End Date Taking? Authorizing  Provider  abiraterone acetate (ZYTIGA) 250 MG tablet TAKE 4 TABLETS (1,000 MG TOTAL) BY MOUTH DAILY. TAKE ON AN EMPTY STOMACH 1 HOUR BEFORE OR 2 HOURS AFTER A MEAL 12/06/19   Lequita Asal, MD  atorvastatin (LIPITOR) 80 MG tablet Take 80 mg by mouth daily. 11/15/15   [provider]  carvedilol (COREG) 25 MG tablet Take 25 mg by mouth 2 (two) times daily with a meal.    [provider]  Cholecalciferol (VITAMIN D) 50 MCG (2000 UT) tablet Take 2,000 Units by mouth daily.     [provider]  DULoxetine (CYMBALTA) 60 MG capsule Take 60 mg by mouth daily.    [provider]  famotidine (PEPCID) 20 MG tablet Take 20 mg by mouth at bedtime as needed for heartburn.  08/26/19 08/25/20  [provider]  feeding supplement, ENSURE ENLIVE, (ENSURE ENLIVE) LIQD Take 237 mLs by mouth 4 (four) times daily -  with meals and at bedtime. 11/17/19   Fritzi Mandes, MD  finasteride (PROSCAR) 5 MG tablet Take 5 mg by mouth daily. 05/22/14   [provider]  insulin aspart (NOVOLOG) 100 UNIT/ML injection Inject 5 Units into the skin 3 (three) times daily with meals. 11/17/19   Fritzi Mandes, MD  Insulin Detemir (LEVEMIR) 100 UNIT/ML Pen Inject 15 Units into the skin daily. 11/17/19   Fritzi Mandes, MD  Multiple Vitamin (MULTI-VITAMIN) tablet Take 1 tablet by mouth daily.    [provider]  nitroGLYCERIN (NITROSTAT) 0.4 MG SL tablet Place 0.4 mg under the tongue every 5 (five) minutes as needed for chest pain.     [provider]  oxyCODONE-acetaminophen (PERCOCET/ROXICET) 5-325 MG tablet Take 1 tablet by mouth every 8 (eight) hours as needed for moderate pain. 11/17/19   Fritzi Mandes, MD  predniSONE (DELTASONE) 5 MG tablet TAKE 1 TABLET BY MOUTH DAILY WITH BREAKFAST. BEGIN TAKING WITH INITIATION OF ABIRATERONE. 12/06/19   Lequita Asal, MD    Allergies Metformin and related  Family Hx History reviewed. No pertinent family history.  Social  Hx Social History   Tobacco Use  . Smoking status: Former Research scientist (life sciences)  . Smokeless tobacco: Never Used  Substance Use Topics  . Alcohol use: Not on file  . Drug use: Not on file     Review of Systems  Constitutional: Negative for fever, chills.  Positive for generalized weakness, recurrent falls. Eyes: Negative for visual changes. ENT: Negative for sore throat. Cardiovascular: Negative for chest pain. Respiratory: Negative for shortness of breath. Gastrointestinal: Negative for nausea, vomiting.  Genitourinary: Negative for dysuria. Musculoskeletal: Negative for leg swelling. Skin: Negative for rash. Neurological: Negative for headaches.   Physical Exam  Vital Signs: ED Triage Vitals  Enc Vitals Group     BP 12/11/19 1729 (!) 112/54     Pulse Rate 12/11/19 1729 74     Resp 12/11/19 1729 18     Temp 12/11/19 1729 98.7 F (37.1 C)     Temp Source 12/11/19 1729 Oral  SpO2 12/11/19 1729 100 %     Weight 12/11/19 1728 224 lb 13.9 oz (102 kg)     Height 12/11/19 1728 5\' 11"  (1.803 m)     Head Circumference --      Peak Flow --      Pain Score 12/11/19 1728 0     Pain Loc --      Pain Edu? --      Excl. in [place]? --     Constitutional: Alert and oriented.  Head: Normocephalic. Atraumatic. Eyes: Conjunctivae clear. Sclera anicteric. Nose: No congestion. No rhinorrhea. Mouth/Throat: Wearing mask.  Neck: No stridor.  No midline CS tenderness. Cardiovascular: Normal rate, regular rhythm. Extremities well perfused.  Chest wall stable and nontender. Respiratory: Normal respiratory effort.  Lungs CTAB. Gastrointestinal: Soft. Non-tender. Non-distended.  Musculoskeletal: No lower extremity edema. No deformities.  FROM bilateral shoulders, elbows, wrists, hips, knees, ankles. Neurologic:  Normal speech and language. No gross focal neurologic deficits are appreciated.  Skin: Skin is warm, dry and intact. No rash noted. Psychiatric: Mood and affect are appropriate for  situation.   Radiology  CT head: IMPRESSION:  No acute intracranial hemorrhage.    Procedures  Procedure(s) performed (including critical care):  Procedures   Initial Impression / Assessment and Plan / ED Course  86 y.o. male who presents to the ED for recurrent falls, generalized weakness.  Ddx: UTI, electrolyte abnormality, dehydration, deconditioning  Will evaluate with labs, imaging.  Will obtain CT head given fall on antiplatelet medication.  Work-up reveals mild hyponatremia, hypochloremia consistent with decreased intake.  Sodium 129 from normal 3 weeks ago.  Also reveals UTI.  We will plan for IV fluids, antibiotics respectively.  In the setting of his frequent falls, electrolyte derangements, and UTI, will plan for admission for treatment of above and likely PT/OT for strengthening.  Patient agreeable.  Updated daughter via phone.   Final Clinical Impression(s) / ED Diagnosis  Final diagnoses:  Recurrent falls  Hyponatremia  Urinary tract infection in elderly patient       Note:  This document was prepared using Dragon voice recognition software and may include unintentional dictation errors.   Lilia Pro., MD 12/12/19 [phone]

## 2019-12-11 NOTE — ED Notes (Signed)
Pt assisted with snack tray at this time.

## 2019-12-11 NOTE — ED Triage Notes (Signed)
Pt comes EMS from home after a fall. Pt states that he turned around to get something and lost his balance. Pt AOx4. Pt doesn't c/o any pain and doesn't want to be here. Pt only came because his life alert called 911. He states that he only wanted help up. Pt is on blood thinners. Pt denies hitting head or LOC.

## 2019-12-11 NOTE — ED Notes (Signed)
Pt cbg was 409 for EMS.

## 2019-12-11 NOTE — ED Notes (Signed)
This RN introduced self to pt. Pt stating he does not know why he is here and wants to go home. Pt states he is not in pain. This RN assured pt that once we get a CT scan we can potentially get him back home. Pt given warm blanket and lights dimmed.

## 2019-12-11 NOTE — ED Notes (Signed)
cbg 381 here

## 2019-12-11 NOTE — ED Triage Notes (Signed)
Pt to ER via EMS from home with reports of fall on Friday and again today.  Per EMS daughter states PCP advised on Friday if pt had another fall to come here to have labs checked.  Pt has appointment with PCP tomorrow.

## 2019-12-12 ENCOUNTER — Telehealth: Payer: Self-pay

## 2019-12-12 ENCOUNTER — Other Ambulatory Visit: Payer: Self-pay

## 2019-12-12 DIAGNOSIS — I130 Hypertensive heart and chronic kidney disease with heart failure and stage 1 through stage 4 chronic kidney disease, or unspecified chronic kidney disease: Secondary | ICD-10-CM | POA: Diagnosis present

## 2019-12-12 DIAGNOSIS — I5032 Chronic diastolic (congestive) heart failure: Secondary | ICD-10-CM | POA: Diagnosis present

## 2019-12-12 DIAGNOSIS — R319 Hematuria, unspecified: Secondary | ICD-10-CM | POA: Diagnosis present

## 2019-12-12 DIAGNOSIS — I2510 Atherosclerotic heart disease of native coronary artery without angina pectoris: Secondary | ICD-10-CM | POA: Diagnosis present

## 2019-12-12 DIAGNOSIS — C7951 Secondary malignant neoplasm of bone: Secondary | ICD-10-CM | POA: Diagnosis present

## 2019-12-12 DIAGNOSIS — Z86718 Personal history of other venous thrombosis and embolism: Secondary | ICD-10-CM | POA: Diagnosis not present

## 2019-12-12 DIAGNOSIS — R296 Repeated falls: Secondary | ICD-10-CM | POA: Diagnosis present

## 2019-12-12 DIAGNOSIS — D472 Monoclonal gammopathy: Secondary | ICD-10-CM | POA: Diagnosis present

## 2019-12-12 DIAGNOSIS — Z7901 Long term (current) use of anticoagulants: Secondary | ICD-10-CM | POA: Diagnosis not present

## 2019-12-12 DIAGNOSIS — N1831 Chronic kidney disease, stage 3a: Secondary | ICD-10-CM | POA: Diagnosis present

## 2019-12-12 DIAGNOSIS — M8448XA Pathological fracture, other site, initial encounter for fracture: Secondary | ICD-10-CM | POA: Diagnosis present

## 2019-12-12 DIAGNOSIS — E871 Hypo-osmolality and hyponatremia: Secondary | ICD-10-CM | POA: Diagnosis present

## 2019-12-12 DIAGNOSIS — Z794 Long term (current) use of insulin: Secondary | ICD-10-CM | POA: Diagnosis not present

## 2019-12-12 DIAGNOSIS — Z8546 Personal history of malignant neoplasm of prostate: Secondary | ICD-10-CM | POA: Diagnosis not present

## 2019-12-12 DIAGNOSIS — C61 Malignant neoplasm of prostate: Secondary | ICD-10-CM | POA: Diagnosis present

## 2019-12-12 DIAGNOSIS — E785 Hyperlipidemia, unspecified: Secondary | ICD-10-CM | POA: Diagnosis present

## 2019-12-12 DIAGNOSIS — G4733 Obstructive sleep apnea (adult) (pediatric): Secondary | ICD-10-CM | POA: Diagnosis present

## 2019-12-12 DIAGNOSIS — E1122 Type 2 diabetes mellitus with diabetic chronic kidney disease: Secondary | ICD-10-CM | POA: Diagnosis present

## 2019-12-12 DIAGNOSIS — D630 Anemia in neoplastic disease: Secondary | ICD-10-CM | POA: Diagnosis present

## 2019-12-12 DIAGNOSIS — E1165 Type 2 diabetes mellitus with hyperglycemia: Secondary | ICD-10-CM | POA: Diagnosis present

## 2019-12-12 DIAGNOSIS — Z7952 Long term (current) use of systemic steroids: Secondary | ICD-10-CM | POA: Diagnosis not present

## 2019-12-12 DIAGNOSIS — Z20822 Contact with and (suspected) exposure to covid-19: Secondary | ICD-10-CM | POA: Diagnosis present

## 2019-12-12 DIAGNOSIS — D631 Anemia in chronic kidney disease: Secondary | ICD-10-CM | POA: Diagnosis present

## 2019-12-12 DIAGNOSIS — K5900 Constipation, unspecified: Secondary | ICD-10-CM | POA: Diagnosis present

## 2019-12-12 LAB — GLUCOSE, CAPILLARY
Glucose-Capillary: 309 mg/dL — ABNORMAL HIGH (ref 70–99)
Glucose-Capillary: 201 mg/dL — ABNORMAL HIGH (ref 70–99)

## 2019-12-12 LAB — COMPREHENSIVE METABOLIC PANEL
ALT: 21 U/L (ref 0–44)
AST: 20 U/L (ref 15–41)
Albumin: 3.2 g/dL — ABNORMAL LOW (ref 3.5–5.0)
Alkaline Phosphatase: 269 U/L — ABNORMAL HIGH (ref 38–126)
Anion gap: 10 (ref 5–15)
BUN: 15 mg/dL (ref 8–23)
CO2: 28 mmol/L (ref 22–32)
Calcium: 8.9 mg/dL (ref 8.9–10.3)
Chloride: 97 mmol/L — ABNORMAL LOW (ref 98–111)
Creatinine, Ser: 1.31 mg/dL — ABNORMAL HIGH (ref 0.61–1.24)
GFR calc Af Amer: 57 mL/min — ABNORMAL LOW (ref >60)
GFR calc non Af Amer: 49 mL/min — ABNORMAL LOW (ref >60)
Glucose, Bld: 378 mg/dL — ABNORMAL HIGH (ref 70–99)
Potassium: 3.9 mmol/L (ref 3.5–5.1)
Sodium: 135 mmol/L (ref 135–145)
Total Bilirubin: 0.5 mg/dL (ref 0.3–1.2)
Total Protein: 6.7 g/dL (ref 6.5–8.1)

## 2019-12-12 LAB — CBC
HCT: 29.3 % — ABNORMAL LOW (ref 39.0–52.0)
Hemoglobin: 9.5 g/dL — ABNORMAL LOW (ref 13.0–17.0)
MCH: 28.1 pg (ref 26.0–34.0)
MCHC: 32.4 g/dL (ref 30.0–36.0)
MCV: 86.7 fL (ref 80.0–100.0)
Platelets: 211 10*3/uL (ref 150–400)
RBC: 3.38 MIL/uL — ABNORMAL LOW (ref 4.22–5.81)
RDW: 16 % — ABNORMAL HIGH (ref 11.5–15.5)
WBC: 8.4 10*3/uL (ref 4.0–10.5)
nRBC: 0 % (ref 0.0–0.2)

## 2019-12-12 LAB — SARS CORONAVIRUS 2 (TAT 6-24 HRS): SARS Coronavirus 2: NEGATIVE

## 2019-12-12 MED ORDER — SODIUM CHLORIDE 0.9 % IV SOLN: INTRAVENOUS | Status: DC

## 2019-12-12 MED ORDER — INSULIN ASPART 100 UNIT/ML ~~LOC~~ SOLN
0.0000 [IU] | Freq: Three times a day (TID) | SUBCUTANEOUS | Status: DC
  Administered 2019-12-12: 11 [IU] via SUBCUTANEOUS
  Administered 2019-12-12 (×2): 8 [IU] via SUBCUTANEOUS
  Administered 2019-12-12: 11 [IU] via SUBCUTANEOUS
  Administered 2019-12-13 (×2): 3 [IU] via SUBCUTANEOUS
  Administered 2019-12-13: 5 [IU] via SUBCUTANEOUS
  Administered 2019-12-14: 8 [IU] via SUBCUTANEOUS
  Administered 2019-12-14 – 2019-12-15 (×2): 3 [IU] via SUBCUTANEOUS
  Administered 2019-12-15: 11 [IU] via SUBCUTANEOUS
  Filled 2019-12-12 (×11): qty 1

## 2019-12-12 MED ORDER — FINASTERIDE 5 MG PO TABS
5.0000 mg | ORAL_TABLET | Freq: Every day | ORAL | Status: DC
  Administered 2019-12-12 – 2019-12-15 (×4): 5 mg via ORAL
  Filled 2019-12-12 (×4): qty 1

## 2019-12-12 MED ORDER — OXYCODONE-ACETAMINOPHEN 5-325 MG PO TABS: 1.0000 | ORAL_TABLET | Freq: Three times a day (TID) | ORAL | Status: DC | PRN

## 2019-12-12 MED ORDER — ATORVASTATIN CALCIUM 20 MG PO TABS
80.0000 mg | ORAL_TABLET | Freq: Every day | ORAL | Status: DC
  Administered 2019-12-12 – 2019-12-15 (×4): 80 mg via ORAL
  Filled 2019-12-12 (×4): qty 4

## 2019-12-12 MED ORDER — PREDNISONE 10 MG PO TABS
5.0000 mg | ORAL_TABLET | Freq: Every day | ORAL | Status: DC
  Administered 2019-12-12 – 2019-12-15 (×4): 5 mg via ORAL
  Filled 2019-12-12 (×4): qty 1

## 2019-12-12 MED ORDER — ABIRATERONE ACETATE 250 MG PO TABS
1000.0000 mg | ORAL_TABLET | Freq: Every day | ORAL | Status: DC
  Administered 2019-12-14 – 2019-12-15 (×2): 1000 mg via ORAL
  Filled 2019-12-12 (×2): qty 4

## 2019-12-12 MED ORDER — SODIUM CHLORIDE 0.9 % IV SOLN: 250.0000 mL | INTRAVENOUS | Status: DC | PRN

## 2019-12-12 MED ORDER — ACETAMINOPHEN 650 MG RE SUPP: 650.0000 mg | Freq: Four times a day (QID) | RECTAL | Status: DC | PRN

## 2019-12-12 MED ORDER — ONDANSETRON HCL 4 MG PO TABS: 4.0000 mg | ORAL_TABLET | Freq: Four times a day (QID) | ORAL | Status: DC | PRN

## 2019-12-12 MED ORDER — VITAMIN D 25 MCG (1000 UNIT) PO TABS
2000.0000 [IU] | ORAL_TABLET | Freq: Every day | ORAL | Status: DC
  Administered 2019-12-12 – 2019-12-15 (×4): 2000 [IU] via ORAL
  Filled 2019-12-12 (×4): qty 2

## 2019-12-12 MED ORDER — INSULIN ASPART 100 UNIT/ML ~~LOC~~ SOLN
5.0000 [IU] | Freq: Three times a day (TID) | SUBCUTANEOUS | Status: DC
  Administered 2019-12-12 – 2019-12-13 (×4): 5 [IU] via SUBCUTANEOUS
  Filled 2019-12-12 (×4): qty 1

## 2019-12-12 MED ORDER — FAMOTIDINE 20 MG PO TABS: 20.0000 mg | ORAL_TABLET | Freq: Every evening | ORAL | Status: DC | PRN

## 2019-12-12 MED ORDER — ONDANSETRON HCL 4 MG/2ML IJ SOLN: 4.0000 mg | Freq: Four times a day (QID) | INTRAMUSCULAR | Status: DC | PRN

## 2019-12-12 MED ORDER — SODIUM CHLORIDE 0.9% FLUSH
3.0000 mL | Freq: Two times a day (BID) | INTRAVENOUS | Status: DC
  Administered 2019-12-12 – 2019-12-15 (×7): 3 mL via INTRAVENOUS

## 2019-12-12 MED ORDER — SODIUM CHLORIDE 0.9% FLUSH: 3.0000 mL | INTRAVENOUS | Status: DC | PRN

## 2019-12-12 MED ORDER — SODIUM CHLORIDE 0.9% FLUSH
3.0000 mL | Freq: Two times a day (BID) | INTRAVENOUS | Status: DC
  Administered 2019-12-12 – 2019-12-13 (×2): 3 mL via INTRAVENOUS

## 2019-12-12 MED ORDER — ENSURE ENLIVE PO LIQD
237.0000 mL | Freq: Three times a day (TID) | ORAL | Status: DC
  Administered 2019-12-12 – 2019-12-15 (×13): 237 mL via ORAL

## 2019-12-12 MED ORDER — ADULT MULTIVITAMIN W/MINERALS CH
1.0000 | ORAL_TABLET | Freq: Every day | ORAL | Status: DC
  Administered 2019-12-12 – 2019-12-15 (×4): 1 via ORAL
  Filled 2019-12-12 (×4): qty 1

## 2019-12-12 MED ORDER — NITROGLYCERIN 0.4 MG SL SUBL: 0.4000 mg | SUBLINGUAL_TABLET | SUBLINGUAL | Status: DC | PRN

## 2019-12-12 MED ORDER — DULOXETINE HCL 60 MG PO CPEP
60.0000 mg | ORAL_CAPSULE | Freq: Every day | ORAL | Status: DC
  Administered 2019-12-12 – 2019-12-15 (×4): 60 mg via ORAL
  Filled 2019-12-12 (×4): qty 1

## 2019-12-12 MED ORDER — ACETAMINOPHEN 325 MG PO TABS: 650.0000 mg | ORAL_TABLET | Freq: Four times a day (QID) | ORAL | Status: DC | PRN

## 2019-12-12 MED ORDER — CARVEDILOL 25 MG PO TABS
25.0000 mg | ORAL_TABLET | Freq: Two times a day (BID) | ORAL | Status: DC
  Administered 2019-12-12 – 2019-12-15 (×7): 25 mg via ORAL
  Filled 2019-12-12 (×7): qty 1

## 2019-12-12 MED ORDER — SODIUM CHLORIDE 0.9 % IV SOLN
1.0000 g | INTRAVENOUS | Status: DC
  Filled 2019-12-12: qty 10

## 2019-12-12 MED ORDER — INSULIN DETEMIR 100 UNIT/ML ~~LOC~~ SOLN
15.0000 [IU] | Freq: Every day | SUBCUTANEOUS | Status: DC
  Administered 2019-12-12: 15 [IU] via SUBCUTANEOUS
  Filled 2019-12-12 (×2): qty 0.15

## 2019-12-12 NOTE — Telephone Encounter (Signed)
Spoke with the patient daughter to inform her of his PSA levels which has decreased from 2,241.00 to 216.00.  She was understanding and reports that [name] has had a fall on 12/09/2019 and again last night and he is currently in the Hospital.

## 2019-12-12 NOTE — TOC Progression Note (Signed)
Transition of Care (TOC) - Progression Note    Patient Details  Name: Brandon Kopko Sr. MRN: [id number] Date of Birth: [birth year]  [facility]) CM/SW Contact  Owain Eckerman, Gardiner Rhyme, LCSW Phone Number: 12/12/2019, 10:26 AM  Clinical Narrative:  Met with pt who reports he lives with daughter but she works different hours. He was at [facility] and was released 2/15 and has been home one week then fell. His home health was suppose to start today, but he is here. He told worker to call daughter-Cynthia. Have attempted to call her and her voice mail is full. Will see if she calls this worker back. Will work on discharge needs. See if PT will eval while here and see how well he is moving.         Expected Discharge Plan and Services                                                 Social Determinants of Health (SDOH) Interventions    Readmission Risk Interventions Readmission Risk Prevention Plan 11/17/2019  Transportation Screening Complete  PCP or Specialist Appt within 3-5 Days (No Data)  [place] or [place] (No Data)  Palliative Care Screening Complete  Medication Review (RN Care Manager) Complete

## 2019-12-12 NOTE — TOC Initial Note (Signed)
Transition of Care (TOC) - Initial/Assessment Note    Patient Details  Name: Brandon Mallozzi Sr. MRN: [id number] Date of Birth: 1932/05/07  Transition of Care [facility]) CM/SW Contact:    Elease Hashimoto, LCSW Phone Number: 12/12/2019, 1:44 PM  Clinical Narrative:  Finally got a hold of pt's daughter-Cynthia who reports it was not going well at home. She states: " He can not come home he is too much to handle." He needs to go back to the facility. She feels she has hurt herself trying to take care of pt for the past week. He has fallen two times and she reports EMS is tired of coming out to her house. Discussed pt may be in his co-pay days for SNF and she will need to apply for Medicaid for him. She is his POA and can do this. Will begin the SNF process and do bed search, daughter is not sure [facility] is the best place for him, but will need to see who can offer a bed for him. Will work on [facility] and do bed search.              Expected Discharge Plan: Skilled Nursing Facility Barriers to Discharge: Continued Medical Work up   Patient Goals and CMS Choice Patient states their goals for this hospitalization and ongoing recovery are:: I don't know call [name] she knows what is going on      Expected Discharge Plan and Services Expected Discharge Plan: [place] In-house Referral: Clinical Social Work     Living arrangements for the past 2 months: Single Family Home                                      Prior Living Arrangements/Services Living arrangements for the past 2 months: Single Family Home Lives with:: Adult Children          Need for Family Participation in Patient Care: Yes (Comment) Care giver support system in place?: No (comment) Current home services: DME(rw suppose to have [facility] start today 2/22)    Activities of Daily Living Home Assistive Devices/Equipment: Kasandra Knudsen (specify quad or straight), Walker (specify type)(straight rolling) ADL  Screening (condition at time of admission) Patient's cognitive ability adequate to safely complete daily activities?: Yes Is the patient deaf or have difficulty hearing?: No Does the patient have difficulty seeing, even when wearing glasses/contacts?: No Does the patient have difficulty concentrating, remembering, or making decisions?: No Patient able to express need for assistance with ADLs?: Yes Does the patient have difficulty dressing or bathing?: Yes Independently performs ADLs?: Yes (appropriate for developmental age) Does the patient have difficulty walking or climbing stairs?: Yes Weakness of Legs: Both Weakness of Arms/Hands: None  Permission Sought/Granted   Permission granted to share information with : Yes, Verbal Permission Granted  Share Information with NAME: Cynthia-daughter     Permission granted to share info w Relationship: daughter     Emotional Assessment Appearance:: Appears stated age Attitude/Demeanor/Rapport: Engaged Affect (typically observed): Adaptable, Calm Orientation: : Oriented to Self, Oriented to Place, Oriented to  Time, Oriented to Situation      Admission diagnosis:  Hyponatremia [E87.1] Multiple falls [R29.6] Recurrent falls [R29.6] Urinary tract infection in elderly patient [N39.0] Patient Active Problem List   Diagnosis Date Noted  . Multiple falls 12/12/2019  . Hyponatremia 12/11/2019  . Acute lower UTI 12/11/2019  . Monoclonal gammopathy   .  Palliative care by specialist   . Goals of care, counseling/discussion   . Prostate cancer metastatic to bone ([place]) 11/07/2019  . Elevated INR 11/05/2019  . Acute blood loss anemia 11/05/2019  . OSA on CPAP 11/05/2019  . Dyslipidemia 11/05/2019  . Morbid obesity ([place]) 11/05/2019  . DVT (deep venous thrombosis) ([place]) 11/05/2019  . Hematuria 11/04/2019  . Hepatitis C antibody test positive 10/20/2019  . Anasarca 10/15/2019  . Acute exacerbation of CHF (congestive heart failure) ([place])  10/15/2019  . CAD (coronary artery disease) 10/15/2019  . Insulin dependent type 2 diabetes mellitus ([place]) 10/15/2019  . Essential hypertension 10/15/2019  . Chronic anticoagulation 10/15/2019  . Acute kidney injury superimposed on CKD ([place]) 10/15/2019  . CKD (chronic kidney disease) stage 3, GFR 30-59 ml/min 10/15/2019  . Generalized weakness 10/15/2019  . Frequent falls 10/15/2019  . Elevated liver enzymes 10/15/2019  . Chronic diastolic CHF (congestive heart failure) ([place]) 10/15/2019  . Elevated LFTs    PCP:  [name], PA-C Pharmacy:   [facility], [address] [place] [address] Phone: [phone] Fax: [place], [place] [address] [place] [address] Phone: [phone] Fax: [phone]     Social Determinants of Health (SDOH) Interventions    Readmission Risk Interventions Readmission Risk Prevention Plan 11/17/2019  Transportation Screening Complete  PCP or Specialist Appt within 3-5 Days (No Data)  [place] or [place] (No Data)  Palliative Care Screening Complete  Medication Review (RN Care Manager) Complete

## 2019-12-12 NOTE — Progress Notes (Addendum)
PROGRESS NOTE    Brandon Ballweg Sr.  TFT:[id number] DOB: 09-Dec-1931 DOA: 12/11/2019 PCP: [name], PA-C    Assessment & Plan:   Principal Problem:   Frequent falls Active Problems:   CAD (coronary artery disease)   Insulin dependent type 2 diabetes mellitus (HCC)   Essential hypertension   Chronic anticoagulation   CKD (chronic kidney disease) stage 3, GFR 30-59 ml/min   Generalized weakness   Chronic diastolic CHF (congestive heart failure) (HCC)   OSA on CPAP   Dyslipidemia   DVT (deep venous thrombosis) (HCC)   Prostate cancer metastatic to bone ([place])   Monoclonal gammopathy   Hyponatremia   Acute lower UTI   Multiple falls    Brandon Sluka Sr. is a 86 y.o. AA male with medical history significant of coronary artery disease, congestive heart failure diastolic dysfunction, diabetes mellitus type 2, chronic anemia, hypertension, chronic kidney disease stage III, hyperlipidemia, history of DVT with IVC filter, monoclonal gammopathy, prostate cancer with bone metastasis, weight loss, came with a chief complaint of general weakness status post fall x2 at home.  Patient recently came home from rehab on 2/15 and had been living with his daughter. Since Friday he has fallen twice and daughter reported to EMS that he has hadprogressive generalized weakness, unsteadiness on his feet for atleast the last several days.  Frequent falls History of frequent falls Patient fell twice in 3 days.  No loss of consciousness.  Possible lightheaded vagovagal, mild dehydration, hyponatremia Electrocardiogram normal sinus no acute ST-T changes.  echocardiogram in 2020 December, ejection fraction 60 to 65%, aortic sclerosis Plan: --continue IV fluids --physical therapy to assess  Hyponatremia secondary to poor p.o. intake Plan IV fluids  UTI, ruled out --started empirically on Rocephin  --d/c Rocephin  Coronary artery disease Status post stent Was on aspirin and Plavix  both on hold because of severe hematuria Electrocardiogram no acute ST-T changes no complaining of chest pain Plan  --continue home medication, Coreg, Lipitor  Congestive heart failure diastolic dysfunction --continue home medication, Coreg  Hypercholesterolemia Lipitor  Diabetes mellitus type 2 with hyperglycemia insulin-dependent Levemir, insulin sliding scale per sensitivity factor  Chronic kidney disease stage IIIa Avoid nephrotoxins follow BUN and creatinine  Essential hypertension Coreg  History of deep venous thrombosis Status post IV filter 11/07/2019 Was on Eliquis for DVT and aspirin and Plavix Was hold because of severe hematuria  Now hemoglobin 9.8 Plan  continue to hold anticoagulation   Chronic anemia Secondary to chronic kidney disease, prostate cancer with metastasis, low iron  Prostate cancer with metastasis to the bone On chemotherapy degarelix, abiraterone with prednisone Follow-up with [name] oncology  Monoclonal gammopathy  Follow-up with [place] oncology   Obstructive sleep apnea CPAP   DVT prophylaxis: SCD/Compression stockings Code Status: Full code  Family Communication: updated daughter on the phone Disposition Plan: Pt recently returned home from SNF rehab, and was living with her daughter, who said she couldn't take care of him at home anymore.  Will be looking at SNF placement.     Subjective and Interval History:  Pt had no complaints.  Reported eating better.  Urine clear.  No fever, N/V/D, dysuria.   Objective: Vitals:   12/12/19 0745 12/12/19 0836 12/12/19 0900 12/12/19 1630  BP:  (!) 114/54 119/70 113/64  Pulse:  75 77 76  Resp: 16 16 18 17   Temp:  97.6 F (36.4 C) 98 F (36.7 C) 98.4 F (36.9 C)  TempSrc:    Oral  SpO2:  100% 100% 100%  Weight:      Height:        Intake/Output Summary (Last 24 hours) at 12/12/2019 1744 Last data filed at 12/12/2019 1650 Gross per 24 hour  Intake 1893.2 ml  Output 850  ml  Net 1043.2 ml   Filed Weights   12/11/19 1728  Weight: 102 kg    Examination:   Constitutional: NAD, AAOx3 HEENT: conjunctivae and lids normal, EOMI CV: RRR no M,R,G. Distal pulses +2.  No cyanosis.   RESP: CTA B/L, normal respiratory effort  GI: +BS, NTND Extremities: No effusions, edema, or tenderness in BLE SKIN: warm, dry.  Lower leg skin leathery and with grooves but no skin cracks.   Neuro: II - XII grossly intact.  Sensation intact Psych: flat mood and affect.     Data Reviewed: I have personally reviewed following labs and imaging studies  CBC: Recent Labs  Lab 12/08/19 0025 12/11/19 1743 12/12/19 0323  WBC 7.7 8.1 8.4  NEUTROABS 5.1 6.5  --   HGB 11.0* 9.8* 9.5*  HCT 35.0* 31.5* 29.3*  MCV 87.7 88.0 86.7  PLT 249 230 299   Basic Metabolic Panel: Recent Labs  Lab 12/08/19 0234 12/09/19 1338 12/11/19 1743 12/12/19 0323  NA 133* 128* 129* 135  K 4.0 4.1 4.6 3.9  CL 98 94* 91* 97*  CO2 24 24 27 28   GLUCOSE 96 231* 387* 378*  BUN 13 20 17 15   CREATININE 1.33* 1.62* 1.52* 1.31*  CALCIUM 9.5 8.9 9.4 8.9  MG 2.0  --   --   --    GFR: Estimated Creatinine Clearance: 50.1 mL/min (A) (by C-G formula based on SCr of 1.31 mg/dL (H)). Liver Function Tests: Recent Labs  Lab 12/08/19 0234 12/11/19 1743 12/12/19 0323  AST 28 20 20   ALT 33 24 21  ALKPHOS 343* 281* 269*  BILITOT 0.7 0.8 0.5  PROT 7.4 7.0 6.7  ALBUMIN 3.7 3.4* 3.2*   No results for input(s): LIPASE, AMYLASE in the last 168 hours. No results for input(s): AMMONIA in the last 168 hours. Coagulation Profile: No results for input(s): INR, PROTIME in the last 168 hours. Cardiac Enzymes: No results for input(s): CKTOTAL, CKMB, CKMBINDEX, TROPONINI in the last 168 hours. BNP (last 3 results) No results for input(s): PROBNP in the last 8760 hours. HbA1C: No results for input(s): HGBA1C in the last 72 hours. CBG: Recent Labs  Lab 12/08/19 0233 12/08/19 0306 12/11/19 1738  12/11/19 2342 12/12/19 1156  GLUCAP 78 71 381* 296* 309*   Lipid Profile: No results for input(s): CHOL, HDL, LDLCALC, TRIG, CHOLHDL, LDLDIRECT in the last 72 hours. Thyroid Function Tests: No results for input(s): TSH, T4TOTAL, FREET4, T3FREE, THYROIDAB in the last 72 hours. Anemia Panel: No results for input(s): VITAMINB12, FOLATE, FERRITIN, TIBC, IRON, RETICCTPCT in the last 72 hours. Sepsis Labs: No results for input(s): PROCALCITON, LATICACIDVEN in the last 168 hours.  Recent Results (from the past 240 hour(s))  SARS CORONAVIRUS 2 (TAT 6-24 HRS) Nasopharyngeal Nasopharyngeal Swab     Status: None   Collection Time: 12/11/19 11:03 PM   Specimen: Nasopharyngeal Swab  Result Value Ref Range Status   SARS Coronavirus 2 NEGATIVE NEGATIVE Final    Comment: (NOTE) SARS-CoV-2 target nucleic acids are NOT DETECTED. The SARS-CoV-2 RNA is generally detectable in upper and lower respiratory specimens during the acute phase of infection. Negative results do not preclude SARS-CoV-2 infection, do not rule out co-infections with other pathogens, and should not  be used as the sole basis for treatment or other patient management decisions. Negative results must be combined with clinical observations, patient history, and epidemiological information. The expected result is Negative. Fact Sheet for Patients: SugarRoll.be Fact Sheet for Healthcare Providers: https://www.woods-mathews.com/ This test is not yet approved or cleared by the [place] FDA and  has been authorized for detection and/or diagnosis of SARS-CoV-2 by FDA under an Emergency Use Authorization (EUA). This EUA will remain  in effect (meaning this test can be used) for the duration of the COVID-19 declaration under Section 56 4(b)(1) of the Act, 21 U.S.C. section 360bbb-3(b)(1), unless the authorization is terminated or revoked sooner. Performed at [facility], [place]  [address], [place], [place] [address]       Radiology Studies: CT Head Wo Contrast  Result Date: 12/11/2019 CLINICAL DATA:  86 year old male with fall. EXAM: CT HEAD WITHOUT CONTRAST TECHNIQUE: Contiguous axial images were obtained from the base of the skull through the vertex without intravenous contrast. COMPARISON:  Head CT dated 10/15/2019. FINDINGS: Brain: There is mild age-related atrophy and chronic microvascular ischemic changes. There is no acute intracranial hemorrhage. No mass effect or midline shift. No extra-axial fluid collection. Vascular: No hyperdense vessel or unexpected calcification. Skull: Normal. Negative for fracture or focal lesion. Sinuses/Orbits: No acute finding. Other: None IMPRESSION: No acute intracranial hemorrhage. Electronically Signed   By: Anner Crete M.D.   On: 12/11/2019 20:54     Scheduled Meds: . abiraterone acetate  1,000 mg Oral Daily  . atorvastatin  80 mg Oral Daily  . carvedilol  25 mg Oral BID WC  . cholecalciferol  2,000 Units Oral Daily  . DULoxetine  60 mg Oral Daily  . feeding supplement (ENSURE ENLIVE)  237 mL Oral TID WC & HS  . finasteride  5 mg Oral Daily  . insulin aspart  0-15 Units Subcutaneous TID WC  . insulin aspart  5 Units Subcutaneous TID WC  . insulin detemir  15 Units Subcutaneous Daily  . multivitamin with minerals  1 tablet Oral Daily  . predniSONE  5 mg Oral Q breakfast  . sodium chloride flush  3 mL Intravenous Q12H  . sodium chloride flush  3 mL Intravenous Q12H   Continuous Infusions: . sodium chloride    . sodium chloride 100 mL/hr at 12/12/19 0255     LOS: 0 days     Enzo Bi, MD [facility] If 7PM-7AM, please contact night-coverage 12/12/2019, 5:44 PM

## 2019-12-12 NOTE — H&P (Signed)
History and Physical    Brandon Biello Sr. XVQ:[id number] DOB: [birth year] DOA: 12/11/2019  PCP: [name], PA-C    Patient coming from: Home lives with his daughter    Chief Complaint: Generalized weakness, status post fall x2  HPI: Brandon Marullo Sr. is a 86 y.o. male with medical history significant of coronary artery disease, congestive heart failure diastolic dysfunction, diabetes mellitus type 2, chronic anemia, hypertension, chronic kidney disease stage III, hyperlipidemia, history of DVT with IVC filter, monoclonal gammopathy, prostate cancer with bone metastasis, weight loss, came with a chief complaint of general weakness status post fall x2 at home. His only history could get from him is he fell twice in the bathroom and did not lose his consciousness. Per emergency room physician history from his daughter Patient recently came home from rehab on 2/15 and is living with his daughter. Since Friday he has fallen twice and daughter reported to EMS that he has had progressive generalized weakness, unsteadiness on his feet for at least the last several days.  Patient himself reports falling because he turned awkwardly in the restroom reaching for a towel, causing him to lose his balance. Denies preceding presyncopal symptoms, specifically denies any preceding chest pain, dizziness, lightheadedness, palpitations.  He states on Friday he fell and landed on his back, denies any pain related to this.  Today he landed on his left side, again denies any pain related to this.  Denies any head injuries or resultant loss of consciousness.  He is on blood thinning medication. ED Course:  In the emergency room He was found to be slightly hyponatremic sodium 129, BUN 17 creatinine 1.5 blood sugar 387, with normal anion gap  was given IV fluids 1 L normal saline Hemoglobin 9.8 white count 8.1 platelets 230 Iron 29 TIBC 190 ferritin 549 Urine analysis questionable urinary tract infection  started on Rocephin Electrocardiogram normal sinus no acute ST-T changes CT head no acute pathology  Review of Systems: As per HPI otherwise 10 point review of systems negative.  The exception for x2 Generalized weakness  Past Medical History:  Diagnosis Date  . Anemia, normocytic normochromic   . BPH (benign prostatic hyperplasia)   . CAD (coronary artery disease)   . Cataracts, bilateral   . Chronic renal insufficiency   . Colon polyp   . COPD (chronic obstructive pulmonary disease) ([place])   . Diabetes mellitus without complication ([place])   . DVT (deep venous thrombosis) ([place])   . Edema of both legs   . Glaucoma   . Gout   . Heart murmur   . Hepatitis C antibody test positive 10/20/2019  . Hyperlipidemia   . Hypertension   . Moderate persistent asthma without complication   . Myocardial infarction ([place])   . Neuromuscular disorder ([place])   . Neuropathy   . Peripheral vascular disease ([place])   . Premature ejaculation   . Prostate cancer ([place])   . RLS (restless legs syndrome)   . Sleep apnea   . Stented coronary artery 11/13/2015  . Uncontrolled type 2 diabetes mellitus with stage 3 chronic kidney disease, with long-term current use of insulin ([place])     Past Surgical History:  Procedure Laterality Date  . APPENDECTOMY    . [place]  2009  . COLONOSCOPY  2012  . EYE SURGERY    . IVC FILTER INSERTION N/A 11/07/2019   Procedure: IVC FILTER INSERTION;  Surgeon: Algernon Huxley, MD;  Location: [facility];  Service: Cardiovascular;  Laterality: N/A;  . PROSTATE SURGERY    . TONSILLECTOMY       reports that he has quit smoking. He has never used smokeless tobacco. No history on file for alcohol and drug.  Allergies  Allergen Reactions  . Metformin And Related Diarrhea    Intolerance due to naturally loose bowels    History reviewed. No pertinent family history.   Prior to Admission medications   Medication Sig Start Date End Date Taking?  Authorizing Provider  abiraterone acetate (ZYTIGA) 250 MG tablet TAKE 4 TABLETS (1,000 MG TOTAL) BY MOUTH DAILY. TAKE ON AN EMPTY STOMACH 1 HOUR BEFORE OR 2 HOURS AFTER A MEAL 12/06/19   Lequita Asal, MD  atorvastatin (LIPITOR) 80 MG tablet Take 80 mg by mouth daily. 11/15/15   [provider]  carvedilol (COREG) 25 MG tablet Take 25 mg by mouth 2 (two) times daily with a meal.    [provider]  Cholecalciferol (VITAMIN D) 50 MCG (2000 UT) tablet Take 2,000 Units by mouth daily.     [provider]  DULoxetine (CYMBALTA) 60 MG capsule Take 60 mg by mouth daily.    [provider]  famotidine (PEPCID) 20 MG tablet Take 20 mg by mouth at bedtime as needed for heartburn.  08/26/19 08/25/20  [provider]  feeding supplement, ENSURE ENLIVE, (ENSURE ENLIVE) LIQD Take 237 mLs by mouth 4 (four) times daily -  with meals and at bedtime. 11/17/19   Fritzi Mandes, MD  finasteride (PROSCAR) 5 MG tablet Take 5 mg by mouth daily. 05/22/14   [provider]  insulin aspart (NOVOLOG) 100 UNIT/ML injection Inject 5 Units into the skin 3 (three) times daily with meals. 11/17/19   Fritzi Mandes, MD  Insulin Detemir (LEVEMIR) 100 UNIT/ML Pen Inject 15 Units into the skin daily. 11/17/19   Fritzi Mandes, MD  Multiple Vitamin (MULTI-VITAMIN) tablet Take 1 tablet by mouth daily.    [provider]  nitroGLYCERIN (NITROSTAT) 0.4 MG SL tablet Place 0.4 mg under the tongue every 5 (five) minutes as needed for chest pain.     [provider]  oxyCODONE-acetaminophen (PERCOCET/ROXICET) 5-325 MG tablet Take 1 tablet by mouth every 8 (eight) hours as needed for moderate pain. 11/17/19   Fritzi Mandes, MD  predniSONE (DELTASONE) 5 MG tablet TAKE 1 TABLET BY MOUTH DAILY WITH BREAKFAST. BEGIN TAKING WITH INITIATION OF ABIRATERONE. 12/06/19   Lequita Asal, MD    Physical Exam: Vitals:   12/11/19 2300 12/11/19 2314 12/11/19 2315 12/12/19 0126  BP: (!) 119/57  119/75  (!) 108/56  Pulse:  74 74 84  Resp:  15  18  Temp:    98.4 F (36.9 C)  TempSrc:      SpO2:  97% 99% 96%  Weight:      Height:        Constitutional: NAD, calm, comfortable Vitals:   12/11/19 2300 12/11/19 2314 12/11/19 2315 12/12/19 0126  BP: (!) 119/57 119/75  (!) 108/56  Pulse:  74 74 84  Resp:  15  18  Temp:    98.4 F (36.9 C)  TempSrc:      SpO2:  97% 99% 96%  Weight:      Height:       Eyes: PERRL, lids and conjunctivae normal ENMT: Mucous membranes are moist. Posterior pharynx clear of any exudate or lesions.Normal dentition.  Neck: normal, supple, no masses, no thyromegaly Respiratory: clear to auscultation bilaterally, no wheezing, no crackles. Normal  respiratory effort. No accessory muscle use.  Cardiovascular: Regular rate and rhythm, no murmurs / rubs / gallops. No extremity edema. 2+ pedal pulses. No carotid bruits.  Abdomen: no tenderness, no masses palpated. No hepatosplenomegaly. Bowel sounds positive.  Musculoskeletal: no clubbing / cyanosis. No joint deformity upper and lower extremities. Good ROM, no contractures. Normal muscle tone.  Skin: no rashes, lesions, ulcers. No induration Neurologic: CN 2-12 grossly intact. Sensation intact, DTR normal. Strength 5/5 in all 4.  Psychiatric: Normal judgment and insight. Alert and oriented x 3. Normal mood.    Labs on Admission: I have personally reviewed following labs and imaging studies  CBC: Recent Labs  Lab 12/08/19 0025 12/11/19 1743  WBC 7.7 8.1  NEUTROABS 5.1 6.5  HGB 11.0* 9.8*  HCT 35.0* 31.5*  MCV 87.7 88.0  PLT 249 657   Basic Metabolic Panel: Recent Labs  Lab 12/08/19 0234 12/09/19 1338 12/11/19 1743  NA 133* 128* 129*  K 4.0 4.1 4.6  CL 98 94* 91*  CO2 24 24 27   GLUCOSE 96 231* 387*  BUN 13 20 17   CREATININE 1.33* 1.62* 1.52*  CALCIUM 9.5 8.9 9.4  MG 2.0  --   --    GFR: Estimated Creatinine Clearance: 43.2 mL/min (A) (by C-G formula based on SCr of 1.52 mg/dL  (H)). Liver Function Tests: Recent Labs  Lab 12/08/19 0234 12/11/19 1743  AST 28 20  ALT 33 24  ALKPHOS 343* 281*  BILITOT 0.7 0.8  PROT 7.4 7.0  ALBUMIN 3.7 3.4*   No results for input(s): LIPASE, AMYLASE in the last 168 hours. No results for input(s): AMMONIA in the last 168 hours. Coagulation Profile: No results for input(s): INR, PROTIME in the last 168 hours. Cardiac Enzymes: No results for input(s): CKTOTAL, CKMB, CKMBINDEX, TROPONINI in the last 168 hours. BNP (last 3 results) No results for input(s): PROBNP in the last 8760 hours. HbA1C: No results for input(s): HGBA1C in the last 72 hours. CBG: Recent Labs  Lab 12/08/19 0200 12/08/19 0233 12/08/19 0306 12/11/19 1738 12/11/19 2342  GLUCAP 74 78 71 381* 296*   Lipid Profile: No results for input(s): CHOL, HDL, LDLCALC, TRIG, CHOLHDL, LDLDIRECT in the last 72 hours. Thyroid Function Tests: No results for input(s): TSH, T4TOTAL, FREET4, T3FREE, THYROIDAB in the last 72 hours. Anemia Panel: Recent Labs    12/09/19 1338  FERRITIN 549*  TIBC 190*  IRON 29*   Urine analysis:    Component Value Date/Time   COLORURINE YELLOW (A) 12/11/2019 2218   APPEARANCEUR CLEAR (A) 12/11/2019 2218   LABSPEC 1.006 12/11/2019 2218   PHURINE 7.0 12/11/2019 2218   GLUCOSEU >=500 (A) 12/11/2019 2218   HGBUR NEGATIVE 12/11/2019 2218   BILIRUBINUR NEGATIVE 12/11/2019 2218   KETONESUR NEGATIVE 12/11/2019 2218   PROTEINUR NEGATIVE 12/11/2019 2218   NITRITE NEGATIVE 12/11/2019 2218   LEUKOCYTESUR SMALL (A) 12/11/2019 2218    Radiological Exams on Admission: CT Head Wo Contrast  Result Date: 12/11/2019 CLINICAL DATA:  86 year old male with fall. EXAM: CT HEAD WITHOUT CONTRAST TECHNIQUE: Contiguous axial images were obtained from the base of the skull through the vertex without intravenous contrast. COMPARISON:  Head CT dated 10/15/2019. FINDINGS: Brain: There is mild age-related atrophy and chronic microvascular ischemic  changes. There is no acute intracranial hemorrhage. No mass effect or midline shift. No extra-axial fluid collection. Vascular: No hyperdense vessel or unexpected calcification. Skull: Normal. Negative for fracture or focal lesion. Sinuses/Orbits: No acute finding. Other: None IMPRESSION: No acute intracranial hemorrhage.  Electronically Signed   By: Anner Crete M.D.   On: 12/11/2019 20:54    EKG: Independently reviewed.  Normal sinus -No acute ST-T changes  Assessment plan  Frequent falls Patient fell twice in 3 days History of frequent falls Happened in the bathroom No loss of consciousness Possible lightheaded vagovagal, mild dehydration, hyponatremia Electrocardiogram normal sinus no acute ST-T changes  echocardiogram in 2020 December, ejection fraction 60 to 65%, aortic sclerosis Plan IV fluids, physical therapy to assess  Hyponatremia secondary to poor p.o. intake Plan IV fluids  UTI Questionable UTI, start empirically on Rocephin urine culture  Coronary artery disease Status post stent Was on aspirin and Plavix was on hold because of severe hematuria Electrocardiogram no acute ST-T changes no complaining of chest pain Plan resume home medication, Coreg, Lipitor  Congestive heart failure diastolic dysfunction Resume home medication, Coreg  Hypercholesterolemia Lipitor  Diabetes mellitus type 2 with hyperglycemia insulin-dependent Levemir, insulin sliding scale per sensitivity factor  Chronic kidney disease stage IIIa Avoid nephrotoxins follow BUN and creatinine  Essential hypertension Coreg  History of deep venous thrombosis Status post IV filter 11/07/2019 Was on Eliquis for DVT and aspirin and Plavix Was hold because of severe hematuria  Now hemoglobin 9.8 Plan continue to hold anticoagulation SCD  Chronic anemia Secondary to chronic kidney disease, prostate cancer with metastasis, low iron 29 May benefit from iron supplement Plan follow  H&H  Prostate cancer with metastasis to the bone On chemotherapy degarelix, abiraterone with prednisone Follow-up with [name] oncology  Monoclonal gammopathy  Follow-up with [place] oncology   Obstructive sleep apnea CPAP   Assessment/Plan Principal Problem:   Frequent falls Active Problems:   CAD (coronary artery disease)   Insulin dependent type 2 diabetes mellitus (HCC)   Essential hypertension   Chronic anticoagulation   CKD (chronic kidney disease) stage 3, GFR 30-59 ml/min   Generalized weakness   Chronic diastolic CHF (congestive heart failure) (HCC)   OSA on CPAP   Dyslipidemia   DVT (deep venous thrombosis) (HCC)   Prostate cancer metastatic to bone (HCC)   Monoclonal gammopathy   Hyponatremia   Acute lower UTI   Multiple falls      DVT prophylaxis: SCD Code Status: Full code Family Communication: To be notified in the morning Disposition Plan: Home Consults called: None Admission status: Full admission   Kaytee Taliercio G Vali Capano MD [facility]  If 7PM-7AM, please contact night-coverage [email or web address]   12/12/2019, 2:43 AM

## 2019-12-12 NOTE — ED Notes (Signed)
Pt cleaned from urine at this time and external catheter placed.

## 2019-12-12 NOTE — Telephone Encounter (Signed)
-----   Message from Lequita Asal, MD sent at 12/10/2019  4:17 PM EST ----- Regarding: Please call patient and daughter  Dramatic decrease in PSA.  M ----- Message ----- From: Buel Ream, Lab [facility] [place] Sent: 12/09/2019   2:12 PM EST To: Lequita Asal, MD

## 2019-12-13 LAB — CBC
HCT: 24.6 % — ABNORMAL LOW (ref 39.0–52.0)
Hemoglobin: 8 g/dL — ABNORMAL LOW (ref 13.0–17.0)
MCH: 28.6 pg (ref 26.0–34.0)
MCHC: 32.5 g/dL (ref 30.0–36.0)
MCV: 87.9 fL (ref 80.0–100.0)
Platelets: 194 10*3/uL (ref 150–400)
RBC: 2.8 MIL/uL — ABNORMAL LOW (ref 4.22–5.81)
RDW: 16 % — ABNORMAL HIGH (ref 11.5–15.5)
WBC: 8.7 10*3/uL (ref 4.0–10.5)
nRBC: 0 % (ref 0.0–0.2)

## 2019-12-13 LAB — GLUCOSE, CAPILLARY
Glucose-Capillary: 293 mg/dL — ABNORMAL HIGH (ref 70–99)
Glucose-Capillary: 225 mg/dL — ABNORMAL HIGH (ref 70–99)
Glucose-Capillary: 178 mg/dL — ABNORMAL HIGH (ref 70–99)
Glucose-Capillary: 183 mg/dL — ABNORMAL HIGH (ref 70–99)
Glucose-Capillary: 237 mg/dL — ABNORMAL HIGH (ref 70–99)
Glucose-Capillary: 195 mg/dL — ABNORMAL HIGH (ref 70–99)

## 2019-12-13 LAB — BASIC METABOLIC PANEL
Anion gap: 10 (ref 5–15)
BUN: 13 mg/dL (ref 8–23)
CO2: 24 mmol/L (ref 22–32)
Calcium: 8.7 mg/dL — ABNORMAL LOW (ref 8.9–10.3)
Chloride: 100 mmol/L (ref 98–111)
Creatinine, Ser: 1.06 mg/dL (ref 0.61–1.24)
GFR calc Af Amer: >60 mL/min (ref >60)
GFR calc non Af Amer: >60 mL/min (ref >60)
Glucose, Bld: 275 mg/dL — ABNORMAL HIGH (ref 70–99)
Potassium: 4.5 mmol/L (ref 3.5–5.1)
Sodium: 134 mmol/L — ABNORMAL LOW (ref 135–145)

## 2019-12-13 LAB — MAGNESIUM: Magnesium: 1.9 mg/dL (ref 1.7–2.4)

## 2019-12-13 MED ORDER — INSULIN DETEMIR 100 UNIT/ML ~~LOC~~ SOLN
20.0000 [IU] | Freq: Every day | SUBCUTANEOUS | Status: DC
  Administered 2019-12-14 – 2019-12-15 (×2): 20 [IU] via SUBCUTANEOUS
  Filled 2019-12-13 (×3): qty 0.2

## 2019-12-13 MED ORDER — INSULIN DETEMIR 100 UNIT/ML ~~LOC~~ SOLN
20.0000 [IU] | Freq: Every day | SUBCUTANEOUS | Status: DC
  Administered 2019-12-13: 20 [IU] via SUBCUTANEOUS
  Filled 2019-12-13 (×2): qty 0.2

## 2019-12-13 MED ORDER — INSULIN ASPART 100 UNIT/ML ~~LOC~~ SOLN
10.0000 [IU] | Freq: Three times a day (TID) | SUBCUTANEOUS | Status: DC
  Administered 2019-12-13 – 2019-12-15 (×5): 10 [IU] via SUBCUTANEOUS
  Filled 2019-12-13 (×5): qty 1

## 2019-12-13 NOTE — Evaluation (Signed)
Occupational Therapy Evaluation Patient Details Name: Brandon Wailes Sr. MRN: [id number] DOB: Sep 20, 1932 Today's Date: 12/13/2019    History of Present Illness Brandon Vandeventer Sr. is a 86 y.o. male with medical history significant of coronary artery disease, congestive heart failure diastolic dysfunction, diabetes mellitus type 2, chronic anemia, hypertension, chronic kidney disease stage III, hyperlipidemia, history of DVT with IVC filter, monoclonal gammopathy, prostate cancer with bone metastasis, weight loss, came with a chief complaint of general weakness status post fall x2 at home. Concerned for possible UTI, but labwork negative; CT scan negative for acute abnormality;   Clinical Impression   Pt was seen for OT evaluation this date. Prior to hospital admission, pt was mostly Indep with BADLs, but requiring some assist for LB dressing from his dtr. In addition, pt requiring assist for all aspects of IADLs including cleaning and laundry for which a member of their church was helping prior to his last hospitalization. Pt lives with his dtr who works in private residence with 3 STE with bilateral railing. Currently pt demonstrates impairments as described below (See OT problem list) which functionally limit his ability to perform ADL/self-care tasks. Pt currently requires MIN/MOD A with ADL transfers, MOD A with seated LB Dressing, setup for UB ADLs, and CGA for fxl mobility with RW.  Pt would benefit from skilled OT to address noted impairments and functional limitations (see below for any additional details) in order to maximize safety and independence while minimizing falls risk and caregiver burden. Upon hospital discharge, recommend STR to maximize pt safety and return to PLOF.     Follow Up Recommendations  SNF;Supervision/Assistance - 24 hour    Equipment Recommendations  (defer to next level of care, anticiapte that pt has all necessary equipment)    Recommendations for Other Services        Precautions / Restrictions Precautions Precautions: Fall Restrictions Weight Bearing Restrictions: No      Mobility Bed Mobility Overal bed mobility: Modified Independent             General bed mobility comments: pt recieved sitting up in chair  Transfers Overall transfer level: Needs assistance Equipment used: Rolling walker (2 wheeled) Transfers: Sit to/from Stand Sit to Stand: Min assist;Mod assist         General transfer comment: MIN/MOD A for sit<>stand from recliner, pt reports having lift chair at home that he has gotten used to.    Balance Overall balance assessment: Needs assistance Sitting-balance support: Feet supported;Bilateral upper extremity supported Sitting balance-Leahy Scale: Fair   Standing balance support: Bilateral upper extremity supported;During functional activity Standing balance-Leahy Scale: Poor Standing balance comment: Requires CGA to MIN A with RW for standing balance and requires verbal cues during fxl mobiltiy for safe use of RW as pt discards to the side while turning/pivoting on fxl mobility.                           ADL either performed or assessed with clinical judgement   ADL                                         General ADL Comments: Pt able to perform UB ADLs with setup, requires MOD A with seated LB dressing to thread socks while in recliner. Pt requires MIN/MOD A with RW for ADL transfers and CGA/MIN A with RW for  fxl mobility.     Vision Patient Visual Report: No change from baseline Additional Comments: pt appears to track appropriately throughout session.     Perception     Praxis      Pertinent Vitals/Pain Pain Assessment: No/denies pain     Hand Dominance Right   Extremity/Trunk Assessment Upper Extremity Assessment Upper Extremity Assessment: Generalized weakness(shld, elbow, grip 4-/5 bilaterally)   Lower Extremity Assessment Lower Extremity Assessment: Defer to  PT evaluation;Generalized weakness   Cervical / Trunk Assessment Cervical / Trunk Assessment: Kyphotic   Communication Communication Communication: No difficulties   Cognition Arousal/Alertness: Awake/alert Behavior During Therapy: WFL for tasks assessed/performed Overall Cognitive Status: Difficult to assess                                 General Comments: Pt was oriented to self and some aspects of situation on assessment, however, unable to name where he was-used whiteboard as resource, but initially states "rehab", when asked PLOF information, pt defers to daughter-Cynthia. In addition, he does know month is February, but states today is the 26th and hesitates on year requiring cues. Primarily appropriate with command/cue following, however, some delayed processing/decreased safety awareness.   General Comments       Exercises Other Exercises Other Exercises: OT facilitates education re: role of OT in acute setting with pt and daughter-cynthia who verbalize understanding. Other Exercises: OT facilitates education with pt and dtr re: safe use of RW for fxl mobiltiy including keeping feet within RW BOS for fall prevention and to reach back to control descent to chair for stand to sit for fall prevention.   Shoulder Instructions      Home Living Family/patient expects to be discharged to:: Skilled nursing facility Living Arrangements: Children Available Help at Discharge: Family;Available PRN/intermittently(pt lives with dtr who works during the day some) Type of Home: House Home Access: Stairs to enter Technical brewer of Steps: 3 Entrance Stairs-Rails: Left;Right       Bathroom Shower/Tub: Occupational psychologist: Handicapped height     Home Equipment: Marine scientist - single point;Bedside commode;Walker - 2 wheels;Hospital bed;Transport chair   Additional Comments: Reports using SPC and RW intermittently fo rhome ambulation. Hospital bed  and transport chair new since last hospital stay (upon returning home from rehab)      Prior Functioning/Environment Level of Independence: Needs assistance  Gait / Transfers Assistance Needed: uses cane and RW interchangably per pt and dtr's report; limited to short distances ADL's / Homemaking Assistance Needed: daughter assists with upper body bathing, does cooking and cleaning; Pt reports having a harder time with don/doff shoes. Pt had PCA through his church that helped with cooking/cleaning/laundry 5 days a week before last hospitalization. Communication / Swallowing Assistance Needed: WFL Comments: Pt lives at home with his daughter; He has been in/out of the hospital multiple times in last month due to recurrent falls with subsequent SNF rehab        OT Problem List: Decreased strength;Decreased activity tolerance;Impaired balance (sitting and/or standing);Decreased safety awareness;Decreased knowledge of use of DME or AE;Decreased knowledge of precautions      OT Treatment/Interventions: Self-care/ADL training;Therapeutic exercise;DME and/or AE instruction;Therapeutic activities;Balance training;Patient/family education    OT Goals(Current goals can be found in the care plan section) Acute Rehab OT Goals Patient Stated Goal: to get stronger OT Goal Formulation: With patient/family Time For Goal Achievement: 12/27/19 Potential to Achieve Goals: Good ADL  Goals Pt Will Perform Lower Body Dressing: with min guard assist;with adaptive equipment;sit to/from stand(with RW for standing clothing mgt) Pt Will Transfer to Toilet: with min guard assist;ambulating;grab bars(to BSC over standard commode to elevate with RW for fxl mobility) Pt Will Perform Toileting - Clothing Manipulation and hygiene: with min guard assist;sit to/from stand(with grab bars) Additional ADL Goal #1: Pt will demo safe use of RW for fxl mobility with <10% verbal/tactile cues.  OT Frequency: Min 2X/week   Barriers  to D/C:            Co-evaluation              AM-PAC OT "6 Clicks" Daily Activity     Outcome Measure Help from another person eating meals?: None Help from another person taking care of personal grooming?: A Little Help from another person toileting, which includes using toliet, bedpan, or urinal?: A Lot Help from another person bathing (including washing, rinsing, drying)?: A Lot Help from another person to put on and taking off regular upper body clothing?: A Little Help from another person to put on and taking off regular lower body clothing?: A Lot 6 Click Score: 16   End of Session Equipment Utilized During Treatment: Gait belt;Rolling walker Nurse Communication: Mobility status  Activity Tolerance: Patient tolerated treatment well Patient left: in chair;with call bell/phone within reach;with chair alarm set  OT Visit Diagnosis: Unsteadiness on feet (R26.81);Muscle weakness (generalized) (M62.81);Repeated falls (R29.6)                Time: 7915-0569 OT Time Calculation (min): 53 min Charges:  OT General Charges $OT Visit: 1 Visit OT Evaluation $OT Eval Moderate Complexity: 1 Mod OT Treatments $Self Care/Home Management : 23-37 mins $Therapeutic Activity: 8-22 mins  Gerrianne Scale, MS, OTR/L ascom [phone] 12/13/19, 4:03 PM

## 2019-12-13 NOTE — Evaluation (Signed)
Physical Therapy Evaluation Patient Details Name: Brandon Edelson Sr. MRN: [id number] DOB: 07-Dec-1931 Today's Date: 12/13/2019   History of Present Illness  Brandon Wadlow Sr. is a 86 y.o. male with medical history significant of coronary artery disease, congestive heart failure diastolic dysfunction, diabetes mellitus type 2, chronic anemia, hypertension, chronic kidney disease stage III, hyperlipidemia, history of DVT with IVC filter, monoclonal gammopathy, prostate cancer with bone metastasis, weight loss, came with a chief complaint of general weakness status post fall x2 at home. Concerned for possible UTI, but labwork negative; CT scan negative for acute abnormality;  Clinical Impression  86 yo Male presents to hospital following recent falls at home. Patient reports being in/out of hospital several times in last month due to frequent falls. He reports his legs feel weak and they just give out. He does exhibit weakness in BLE, grossly 3/5 bilaterally. He also exhibits numbness/tingling in BLE as a result of neuropathy which could be contributing to imbalance. Patient is mod I for bed mobility with bed rails. He does require min A for sit<>Stand transfers from elevated surface using RW. He was able to take a few steps to bedside chair with min A for safety. Patient can be impulsive and requires cues for safety awareness including RW placement and positioning. Patient would benefit from skilled PT intervention to improve strength, balance and mobility; Recommend SNF rehab upon discharge to improve strength, balance and safety to help reduce fall risk;     Follow Up Recommendations SNF    Equipment Recommendations  Other (comment)(to be determined post acute care)    Recommendations for Other Services       Precautions / Restrictions Precautions Precautions: Fall Restrictions Weight Bearing Restrictions: No      Mobility  Bed Mobility Overal bed mobility: Modified Independent              General bed mobility comments: requires elevated head of bed/bedrails; requires increased time;  Transfers Overall transfer level: Needs assistance Equipment used: Rolling walker (2 wheeled) Transfers: Sit to/from Stand Sit to Stand: Min assist         General transfer comment: requires elevated head of bed,mod VCS for forward weight shift and to increase push through BLE;  Ambulation/Gait Ambulation/Gait assistance: Min assist Gait Distance (Feet): 5 Feet Assistive device: Rolling walker (2 wheeled) Gait Pattern/deviations: Step-through pattern;Decreased step length - right;Decreased step length - left;Decreased dorsiflexion - right;Decreased dorsiflexion - left;Trunk flexed Gait velocity: decreased   General Gait Details: pt able to take a few steps to bedside chair; Exhibits short step length with decreased heel strike; increase lean on RW noted due to LE weakness;  Stairs            Wheelchair Mobility    Modified Rankin (Stroke Patients Only)       Balance Overall balance assessment: Needs assistance Sitting-balance support: Feet supported;Bilateral upper extremity supported Sitting balance-Leahy Scale: Fair Sitting balance - Comments: exhibits posterior lean with LE movement;   Standing balance support: Bilateral upper extremity supported;During functional activity Standing balance-Leahy Scale: Poor Standing balance comment: requires min A for balance with RW in standing ;                             Pertinent Vitals/Pain Pain Assessment: No/denies pain    Home Living Family/patient expects to be discharged to:: Skilled nursing facility Living Arrangements: Children Available Help at Discharge: Family;Available PRN/intermittently(daughter works some; has a Press photographer  prn) Type of Home: House Home Access: Stairs to enter Entrance Stairs-Rails: Chemical engineer of Steps: 3   Home Equipment: Shower seat;Cane - single  point;Bedside commode;Walker - 2 wheels Additional Comments: Reports using SPC and RW intermittently fo rhome ambulation    Prior Function Level of Independence: Needs assistance   Gait / Transfers Assistance Needed: uses cane/RW; limited to short distances  ADL's / Homemaking Assistance Needed: daughter assists with upper body bathing, does cooking and cleaning; Pt reports having a harder time with don/doff shoes  Comments: Pt lives at home with his daughter; He has been in/out of the hospital multiple times in last month due to recurrent falls with subsequent SNF rehab     Hand Dominance   Dominant Hand: Right    Extremity/Trunk Assessment   Upper Extremity Assessment Upper Extremity Assessment: Overall WFL for tasks assessed    Lower Extremity Assessment Lower Extremity Assessment: Generalized weakness    Cervical / Trunk Assessment Cervical / Trunk Assessment: Kyphotic  Communication   Communication: No difficulties  Cognition Arousal/Alertness: Awake/alert Behavior During Therapy: WFL for tasks assessed/performed Overall Cognitive Status: Within Functional Limits for tasks assessed                                 General Comments: oriented to place, situation, date and person      General Comments      Exercises     Assessment/Plan    PT Assessment Patient needs continued PT services  PT Problem List Decreased strength;Decreased range of motion;Decreased activity tolerance;Decreased balance;Decreased mobility;Decreased safety awareness;Obesity       PT Treatment Interventions Gait training;Stair training;Functional mobility training;Therapeutic activities;Therapeutic exercise;Balance training;Neuromuscular re-education;Patient/family education    PT Goals (Current goals can be found in the Care Plan section)  Acute Rehab PT Goals Patient Stated Goal: to get stronger PT Goal Formulation: With patient Time For Goal Achievement:  12/27/19 Potential to Achieve Goals: Good    Frequency Min 2X/week   Barriers to discharge Decreased caregiver support;Inaccessible home environment has 3 steps to enter; daughter works some and is not always home    Co-evaluation               AM-PAC PT "6 Clicks" Mobility  Outcome Measure Help needed turning from your back to your side while in a flat bed without using bedrails?: A Little Help needed moving from lying on your back to sitting on the side of a flat bed without using bedrails?: A Lot Help needed moving to and from a bed to a chair (including a wheelchair)?: A Little Help needed standing up from a chair using your arms (e.g., wheelchair or bedside chair)?: A Little Help needed to walk in hospital room?: A Lot Help needed climbing 3-5 steps with a railing? : A Lot 6 Click Score: 15    End of Session Equipment Utilized During Treatment: Gait belt Activity Tolerance: Patient tolerated treatment well;No increased pain Patient left: in chair;with call bell/phone within reach;with chair alarm set;with nursing/sitter in room Nurse Communication: Mobility status PT Visit Diagnosis: Unsteadiness on feet (R26.81);Muscle weakness (generalized) (M62.81)    Time: 3299-2426 PT Time Calculation (min) (ACUTE ONLY): 25 min   Charges:   PT Evaluation $PT Eval Low Complexity: 1 Low            Kimala Horne PT, DPT 12/13/2019, 2:00 PM

## 2019-12-13 NOTE — Progress Notes (Signed)
Pt care being transitioned to new RN. Report given to [facility], RN for patient.

## 2019-12-13 NOTE — Progress Notes (Signed)
PROGRESS NOTE    Brandon Smaldone Sr.  LSL:[id number] DOB: 24-Jan-1932 DOA: 12/11/2019 PCP: [name], PA-C    Assessment & Plan:   Principal Problem:   Frequent falls Active Problems:   CAD (coronary artery disease)   Insulin dependent type 2 diabetes mellitus (HCC)   Essential hypertension   Chronic anticoagulation   CKD (chronic kidney disease) stage 3, GFR 30-59 ml/min   Generalized weakness   Chronic diastolic CHF (congestive heart failure) (HCC)   OSA on CPAP   Dyslipidemia   DVT (deep venous thrombosis) (HCC)   Prostate cancer metastatic to bone ([place])   Monoclonal gammopathy   Hyponatremia   Acute lower UTI   Multiple falls    Brandon Hutmacher Sr. is a 86 y.o. AA male with medical history significant of coronary artery disease, congestive heart failure diastolic dysfunction, diabetes mellitus type 2, chronic anemia, hypertension, chronic kidney disease stage III, hyperlipidemia, history of DVT with IVC filter, monoclonal gammopathy, prostate cancer with bone metastasis, weight loss, came with a chief complaint of general weakness status post fall x2 at home.  Patient recently came home from rehab on 2/15 and had been living with his daughter. Since Friday he has fallen twice and daughter reported to EMS that he has hadprogressive generalized weakness, unsteadiness on his feet for atleast the last several days.  Frequent falls History of frequent falls Patient fell twice in 3 days.  No loss of consciousness.  Possible lightheaded vagovagal, mild dehydration, hyponatremia Electrocardiogram normal sinus no acute ST-T changes.  echocardiogram in 2020 December, ejection fraction 60 to 65%, aortic sclerosis Plan: --d/c IV fluids --physical therapy   Hyponatremia secondary to poor p.o. intake --s/p IVF  UTI, ruled out --started empirically on Rocephin.  No urinary symptoms. --d/c Rocephin  Coronary artery disease Status post stent Was on aspirin and Plavix  both on hold because of severe hematuria Electrocardiogram no acute ST-T changes no complaining of chest pain Plan  --continue home medication, Coreg, Lipitor  Congestive heart failure diastolic dysfunction --continue home medication, Coreg  Hypercholesterolemia Lipitor  Diabetes mellitus type 2 with hyperglycemia insulin-dependent Levemir, insulin sliding scale per sensitivity factor  Chronic kidney disease stage IIIa Avoid nephrotoxins follow BUN and creatinine  Essential hypertension Coreg  History of deep venous thrombosis Status post IV filter 11/07/2019 Was on Eliquis for DVT and aspirin and Plavix Was hold because of severe hematuria  Plan  continue to hold anticoagulation   Chronic anemia Secondary to chronic kidney disease, prostate cancer with metastasis, low iron  Prostate cancer with metastasis to the bone On chemotherapy degarelix, abiraterone with prednisone Follow-up with [name] oncology  Monoclonal gammopathy  Follow-up with [place] oncology   Obstructive sleep apnea CPAP   DVT prophylaxis: SCD/Compression stockings Code Status: Full code  Family Communication: updated daughter on the phone Disposition Plan: Pt recently returned home from SNF rehab, and was living with her daughter, who said she couldn't take care of him at home anymore.  Will be looking at SNF placement.     Subjective and Interval History:  Pt reported doing fine, eating well, making urine with no issues, no hematuria, no dysuria.  No fever, dyspnea, chest pain, abdominal pain, N/V/D.   Objective: Vitals:   12/12/19 1630 12/12/19 2331 12/13/19 0722 12/13/19 1624  BP: 113/64 113/77 123/78 (!) 117/55  Pulse: 76 70 74 64  Resp: 17 18 17 17   Temp: 98.4 F (36.9 C) 98.5 F (36.9 C) 98.4 F (36.9 C) 98.1 F (  36.7 C)  TempSrc: Oral Oral Oral Oral  SpO2: 100% 100% 95% 100%  Weight:      Height:        Intake/Output Summary (Last 24 hours) at 12/13/2019 1655 Last  data filed at 12/13/2019 0720 Gross per 24 hour  Intake 1333.33 ml  Output --  Net 1333.33 ml   Filed Weights   12/11/19 1728  Weight: 102 kg    Examination:   Constitutional: NAD, AAOx3 HEENT: conjunctivae and lids normal, EOMI CV: RRR no M,R,G. Distal pulses +2.  No cyanosis.   RESP: CTA B/L, normal respiratory effort  GI: +BS, NTND Extremities: No effusions, edema, or tenderness in BLE SKIN: warm, dry.  Lower leg skin leathery and with grooves but no skin cracks.   Neuro: II - XII grossly intact.  Sensation intact Psych: better mood and affect.     Data Reviewed: I have personally reviewed following labs and imaging studies  CBC: Recent Labs  Lab 12/08/19 0025 12/11/19 1743 12/12/19 0323 12/13/19 0339  WBC 7.7 8.1 8.4 8.7  NEUTROABS 5.1 6.5  --   --   HGB 11.0* 9.8* 9.5* 8.0*  HCT 35.0* 31.5* 29.3* 24.6*  MCV 87.7 88.0 86.7 87.9  PLT 249 230 211 209   Basic Metabolic Panel: Recent Labs  Lab 12/08/19 0234 12/09/19 1338 12/11/19 1743 12/12/19 0323 12/13/19 0339  NA 133* 128* 129* 135 134*  K 4.0 4.1 4.6 3.9 4.5  CL 98 94* 91* 97* 100  CO2 24 24 27 28 24   GLUCOSE 96 231* 387* 378* 275*  BUN 13 20 17 15 13   CREATININE 1.33* 1.62* 1.52* 1.31* 1.06  CALCIUM 9.5 8.9 9.4 8.9 8.7*  MG 2.0  --   --   --  1.9   GFR: Estimated Creatinine Clearance: 62 mL/min (by C-G formula based on SCr of 1.06 mg/dL). Liver Function Tests: Recent Labs  Lab 12/08/19 0234 12/11/19 1743 12/12/19 0323  AST 28 20 20   ALT 33 24 21  ALKPHOS 343* 281* 269*  BILITOT 0.7 0.8 0.5  PROT 7.4 7.0 6.7  ALBUMIN 3.7 3.4* 3.2*   No results for input(s): LIPASE, AMYLASE in the last 168 hours. No results for input(s): AMMONIA in the last 168 hours. Coagulation Profile: No results for input(s): INR, PROTIME in the last 168 hours. Cardiac Enzymes: No results for input(s): CKTOTAL, CKMB, CKMBINDEX, TROPONINI in the last 168 hours. BNP (last 3 results) No results for input(s): PROBNP in  the last 8760 hours. HbA1C: No results for input(s): HGBA1C in the last 72 hours. CBG: Recent Labs  Lab 12/12/19 1649 12/12/19 2103 12/13/19 0720 12/13/19 1136 12/13/19 1622  GLUCAP 225* 201* 178* 183* 237*   Lipid Profile: No results for input(s): CHOL, HDL, LDLCALC, TRIG, CHOLHDL, LDLDIRECT in the last 72 hours. Thyroid Function Tests: No results for input(s): TSH, T4TOTAL, FREET4, T3FREE, THYROIDAB in the last 72 hours. Anemia Panel: No results for input(s): VITAMINB12, FOLATE, FERRITIN, TIBC, IRON, RETICCTPCT in the last 72 hours. Sepsis Labs: No results for input(s): PROCALCITON, LATICACIDVEN in the last 168 hours.  Recent Results (from the past 240 hour(s))  SARS CORONAVIRUS 2 (TAT 6-24 HRS) Nasopharyngeal Nasopharyngeal Swab     Status: None   Collection Time: 12/11/19 11:03 PM   Specimen: Nasopharyngeal Swab  Result Value Ref Range Status   SARS Coronavirus 2 NEGATIVE NEGATIVE Final    Comment: (NOTE) SARS-CoV-2 target nucleic acids are NOT DETECTED. The SARS-CoV-2 RNA is generally detectable in upper and  lower respiratory specimens during the acute phase of infection. Negative results do not preclude SARS-CoV-2 infection, do not rule out co-infections with other pathogens, and should not be used as the sole basis for treatment or other patient management decisions. Negative results must be combined with clinical observations, patient history, and epidemiological information. The expected result is Negative. Fact Sheet for Patients: SugarRoll.be Fact Sheet for Healthcare Providers: https://www.woods-mathews.com/ This test is not yet approved or cleared by the [place] FDA and  has been authorized for detection and/or diagnosis of SARS-CoV-2 by FDA under an Emergency Use Authorization (EUA). This EUA will remain  in effect (meaning this test can be used) for the duration of the COVID-19 declaration under Section 56  4(b)(1) of the Act, 21 U.S.C. section 360bbb-3(b)(1), unless the authorization is terminated or revoked sooner. Performed at [facility], [place] [address] [place], [place] [address]       Radiology Studies: CT Head Wo Contrast  Result Date: 12/11/2019 CLINICAL DATA:  86 year old male with fall. EXAM: CT HEAD WITHOUT CONTRAST TECHNIQUE: Contiguous axial images were obtained from the base of the skull through the vertex without intravenous contrast. COMPARISON:  Head CT dated 10/15/2019. FINDINGS: Brain: There is mild age-related atrophy and chronic microvascular ischemic changes. There is no acute intracranial hemorrhage. No mass effect or midline shift. No extra-axial fluid collection. Vascular: No hyperdense vessel or unexpected calcification. Skull: Normal. Negative for fracture or focal lesion. Sinuses/Orbits: No acute finding. Other: None IMPRESSION: No acute intracranial hemorrhage. Electronically Signed   By: Anner Crete M.D.   On: 12/11/2019 20:54     Scheduled Meds: . abiraterone acetate  1,000 mg Oral Daily  . atorvastatin  80 mg Oral Daily  . carvedilol  25 mg Oral BID WC  . cholecalciferol  2,000 Units Oral Daily  . DULoxetine  60 mg Oral Daily  . feeding supplement (ENSURE ENLIVE)  237 mL Oral TID WC & HS  . finasteride  5 mg Oral Daily  . insulin aspart  0-15 Units Subcutaneous TID WC  . insulin aspart  10 Units Subcutaneous TID WC  . insulin detemir  20 Units Subcutaneous Daily  . multivitamin with minerals  1 tablet Oral Daily  . predniSONE  5 mg Oral Q breakfast  . sodium chloride flush  3 mL Intravenous Q12H  . sodium chloride flush  3 mL Intravenous Q12H   Continuous Infusions: . sodium chloride    . sodium chloride 100 mL/hr at 12/13/19 1103     LOS: 1 day     Enzo Bi, MD [facility] If 7PM-7AM, please contact night-coverage 12/13/2019, 4:55 PM

## 2019-12-13 NOTE — Plan of Care (Signed)

## 2019-12-13 NOTE — NC FL2 (Signed)
[place] LEVEL OF CARE SCREENING TOOL     IDENTIFICATION  Patient Name: [name] Sr. Birthdate: 1932-07-17 Sex: male Admission Date (Current Location): 12/11/2019  [place] and [place] Number:  Engineering geologist and Address:  [facility], [address] [place], [place] [address]      Provider Number: [id number]  Attending Physician Name and Address:  Enzo Bi, MD  Relative Name and Phone Number:  Laken Lobato [phone]    Current Level of Care: Hospital Recommended Level of Care: [place] Prior Approval Number:    Date Approved/Denied:   PASRR Number: [id number]  Discharge Plan: SNF    Current Diagnoses: Patient Active Problem List   Diagnosis Date Noted  . Multiple falls 12/12/2019  . Hyponatremia 12/11/2019  . Acute lower UTI 12/11/2019  . Monoclonal gammopathy   . Palliative care by specialist   . Goals of care, counseling/discussion   . Prostate cancer metastatic to bone ([place]) 11/07/2019  . Elevated INR 11/05/2019  . Acute blood loss anemia 11/05/2019  . OSA on CPAP 11/05/2019  . Dyslipidemia 11/05/2019  . Morbid obesity ([place]) 11/05/2019  . DVT (deep venous thrombosis) ([place]) 11/05/2019  . Hematuria 11/04/2019  . Hepatitis C antibody test positive 10/20/2019  . Anasarca 10/15/2019  . Acute exacerbation of CHF (congestive heart failure) ([place]) 10/15/2019  . CAD (coronary artery disease) 10/15/2019  . Insulin dependent type 2 diabetes mellitus ([place]) 10/15/2019  . Essential hypertension 10/15/2019  . Chronic anticoagulation 10/15/2019  . Acute kidney injury superimposed on CKD ([place]) 10/15/2019  . CKD (chronic kidney disease) stage 3, GFR 30-59 ml/min 10/15/2019  . Generalized weakness 10/15/2019  . Frequent falls 10/15/2019  . Elevated liver enzymes 10/15/2019  . Chronic diastolic CHF (congestive heart failure) ([place]) 10/15/2019  . Elevated LFTs     Orientation  RESPIRATION BLADDER Height & Weight     Self, Situation, Place, Time  Normal External catheter Weight: 224 lb 13.9 oz (102 kg) Height:  5\' 11"  (180.3 cm)  BEHAVIORAL SYMPTOMS/MOOD NEUROLOGICAL BOWEL NUTRITION STATUS      Continent Diet(Carb Modified thin liquids)  AMBULATORY STATUS COMMUNICATION OF NEEDS Skin   Extensive Assist Verbally Normal                       Personal Care Assistance Level of Assistance  Bathing, Dressing Bathing Assistance: Limited assistance   Dressing Assistance: Limited assistance     Functional Limitations Info             SPECIAL CARE FACTORS FREQUENCY  PT (By licensed PT), OT (By licensed OT)     PT Frequency: 5x week OT Frequency: 5x week            Contractures Contractures Info: Not present    Additional Factors Info  Code Status, Allergies Code Status Info: Full Code Allergies Info: Metformin and related           Current Medications (12/13/2019):  This is the current hospital active medication list Current Facility-Administered Medications  Medication Dose Route Frequency Provider Last Rate Last Admin  . 0.9 %  sodium chloride infusion  250 mL Intravenous PRN Cristescu, Mircea G, MD      . 0.9 %  sodium chloride infusion   Intravenous Continuous Cristescu, Linard Millers, MD 100 mL/hr at 12/13/19 1103 New Bag at 12/13/19 1103  . abiraterone acetate (ZYTIGA) tablet 1,000 mg  1,000 mg Oral Daily Cristescu, Linard Millers, MD      .  acetaminophen (TYLENOL) tablet 650 mg  650 mg Oral Q6H PRN Cristescu, Mircea G, MD       Or  . acetaminophen (TYLENOL) suppository 650 mg  650 mg Rectal Q6H PRN Cristescu, Mircea G, MD      . atorvastatin (LIPITOR) tablet 80 mg  80 mg Oral Daily Cristescu, Mircea G, MD   80 mg at 12/13/19 0902  . carvedilol (COREG) tablet 25 mg  25 mg Oral BID WC Cristescu, Linard Millers, MD   25 mg at 12/13/19 0902  . cholecalciferol (VITAMIN D3) tablet 2,000 Units  2,000 Units Oral Daily Cristescu, Linard Millers, MD   2,000 Units at  12/13/19 0902  . DULoxetine (CYMBALTA) DR capsule 60 mg  60 mg Oral Daily Cristescu, Linard Millers, MD   60 mg at 12/13/19 0903  . famotidine (PEPCID) tablet 20 mg  20 mg Oral QHS PRN Cristescu, Mircea G, MD      . feeding supplement (ENSURE ENLIVE) (ENSURE ENLIVE) liquid 237 mL  237 mL Oral TID WC & HS Cristescu, Mircea G, MD   237 mL at 12/13/19 1230  . finasteride (PROSCAR) tablet 5 mg  5 mg Oral Daily Cristescu, Linard Millers, MD   5 mg at 12/13/19 0902  . insulin aspart (novoLOG) injection 0-15 Units  0-15 Units Subcutaneous TID WC Cristescu, Linard Millers, MD   3 Units at 12/13/19 1231  . insulin aspart (novoLOG) injection 10 Units  10 Units Subcutaneous TID WC Enzo Bi, MD   10 Units at 12/13/19 1231  . insulin detemir (LEVEMIR) injection 20 Units  20 Units Subcutaneous Daily Enzo Bi, MD   20 Units at 12/13/19 1101  . multivitamin with minerals tablet 1 tablet  1 tablet Oral Daily Cristescu, Linard Millers, MD   1 tablet at 12/13/19 0902  . nitroGLYCERIN (NITROSTAT) SL tablet 0.4 mg  0.4 mg Sublingual Q5 min PRN Cristescu, Mircea G, MD      . ondansetron (ZOFRAN) tablet 4 mg  4 mg Oral Q6H PRN Cristescu, Mircea G, MD       Or  . ondansetron (ZOFRAN) injection 4 mg  4 mg Intravenous Q6H PRN Cristescu, Mircea G, MD      . oxyCODONE-acetaminophen (PERCOCET/ROXICET) 5-325 MG per tablet 1 tablet  1 tablet Oral Q8H PRN Cristescu, Mircea G, MD      . predniSONE (DELTASONE) tablet 5 mg  5 mg Oral Q breakfast Cristescu, Linard Millers, MD   5 mg at 12/13/19 0903  . sodium chloride flush (NS) 0.9 % injection 3 mL  3 mL Intravenous Q12H Cristescu, Linard Millers, MD   3 mL at 12/13/19 0924  . sodium chloride flush (NS) 0.9 % injection 3 mL  3 mL Intravenous Q12H Cristescu, Linard Millers, MD   3 mL at 12/13/19 5053     Discharge Medications: Please see discharge summary for a list of discharge medications.  Relevant Imaging Results:  Relevant Lab Results:   Additional Information SSN: [id number]  Dow Blahnik, Gardiner Rhyme,  LCSW

## 2019-12-14 LAB — CBC
HCT: 26.3 % — ABNORMAL LOW (ref 39.0–52.0)
Hemoglobin: 8.2 g/dL — ABNORMAL LOW (ref 13.0–17.0)
MCH: 27.5 pg (ref 26.0–34.0)
MCHC: 31.2 g/dL (ref 30.0–36.0)
MCV: 88.3 fL (ref 80.0–100.0)
Platelets: 191 10*3/uL (ref 150–400)
RBC: 2.98 MIL/uL — ABNORMAL LOW (ref 4.22–5.81)
RDW: 16 % — ABNORMAL HIGH (ref 11.5–15.5)
WBC: 7.8 10*3/uL (ref 4.0–10.5)
nRBC: 0 % (ref 0.0–0.2)

## 2019-12-14 LAB — BASIC METABOLIC PANEL
Anion gap: 5 (ref 5–15)
BUN: 10 mg/dL (ref 8–23)
CO2: 29 mmol/L (ref 22–32)
Calcium: 9.1 mg/dL (ref 8.9–10.3)
Chloride: 101 mmol/L (ref 98–111)
Creatinine, Ser: 0.96 mg/dL (ref 0.61–1.24)
GFR calc Af Amer: >60 mL/min (ref >60)
GFR calc non Af Amer: >60 mL/min (ref >60)
Glucose, Bld: 176 mg/dL — ABNORMAL HIGH (ref 70–99)
Potassium: 4.6 mmol/L (ref 3.5–5.1)
Sodium: 135 mmol/L (ref 135–145)

## 2019-12-14 LAB — GLUCOSE, CAPILLARY
Glucose-Capillary: 115 mg/dL — ABNORMAL HIGH (ref 70–99)
Glucose-Capillary: 184 mg/dL — ABNORMAL HIGH (ref 70–99)
Glucose-Capillary: 255 mg/dL — ABNORMAL HIGH (ref 70–99)
Glucose-Capillary: 199 mg/dL — ABNORMAL HIGH (ref 70–99)

## 2019-12-14 LAB — SARS CORONAVIRUS 2 (TAT 6-24 HRS): SARS Coronavirus 2: NEGATIVE

## 2019-12-14 LAB — MAGNESIUM: Magnesium: 1.9 mg/dL (ref 1.7–2.4)

## 2019-12-14 MED ORDER — GABAPENTIN 100 MG PO CAPS
100.0000 mg | ORAL_CAPSULE | Freq: Two times a day (BID) | ORAL | Status: DC
  Administered 2019-12-14 – 2019-12-15 (×3): 100 mg via ORAL
  Filled 2019-12-14 (×2): qty 1

## 2019-12-14 MED ORDER — SENNOSIDES-DOCUSATE SODIUM 8.6-50 MG PO TABS
1.0000 | ORAL_TABLET | Freq: Two times a day (BID) | ORAL | Status: DC
  Administered 2019-12-14 – 2019-12-15 (×3): 1 via ORAL
  Filled 2019-12-14 (×3): qty 1

## 2019-12-14 MED ORDER — POLYETHYLENE GLYCOL 3350 17 G PO PACK
17.0000 g | PACK | Freq: Every day | ORAL | Status: DC
  Administered 2019-12-15: 17 g via ORAL
  Filled 2019-12-14: qty 1

## 2019-12-14 MED ORDER — LACTULOSE 10 GM/15ML PO SOLN
20.0000 g | Freq: Two times a day (BID) | ORAL | Status: AC
  Administered 2019-12-14 (×2): 20 g via ORAL
  Filled 2019-12-14 (×2): qty 30

## 2019-12-14 NOTE — Plan of Care (Signed)

## 2019-12-14 NOTE — TOC Progression Note (Addendum)
Transition of Care (TOC) - Progression Note    Patient Details  Name: Brandon Huelsmann Sr. MRN: [id number] Date of Birth: 04/17/1932  Transition of Care [facility]) CM/SW Contact  Aleah Ahlgrim, Gardiner Rhyme, LCSW Phone Number: 12/14/2019, 9:03 AM  Clinical Narrative:  Spoke with daughter this am regarding pt's co-pays which will begin after he is at the SNF one day. She reports he is not eligible for medicaid due to his pension. She was asking what are her options. Discussed paying the facility or working on a payment plan or taking pt home. Awaiting return calls from three facilities to find out options for pt and daughter in regards to payment.  9:45 am Daughter is wanting facilities in [place] to be looked at now  Expected Discharge Plan: [place] Barriers to Discharge: Continued Medical Work up  Expected Discharge Plan and Services Expected Discharge Plan: [place] In-house Referral: Clinical Social Work     Living arrangements for the past 2 months: Single Family Home                                       Social Determinants of Health (SDOH) Interventions    Readmission Risk Interventions Readmission Risk Prevention Plan 11/17/2019  Transportation Screening Complete  PCP or Specialist Appt within 3-5 Days (No Data)  [place] or [place] (No Data)  Palliative Care Screening Complete  Medication Review (RN Care Manager) Complete

## 2019-12-14 NOTE — Plan of Care (Signed)
  Problem: Education: Goal: Knowledge of General Education information will improve Description: Including pain rating scale, medication(s)/side effects and non-pharmacologic comfort measures Outcome: Progressing   Problem: Clinical Measurements: Goal: Ability to maintain clinical measurements within normal limits will improve Outcome: Progressing Goal: Will remain free from infection Outcome: Progressing Goal: Diagnostic test results will improve Outcome: Progressing   Problem: Activity: Goal: Risk for activity intolerance will decrease Outcome: Progressing   Problem: Safety: Goal: Ability to remain free from injury will improve Outcome: Progressing   Problem: Skin Integrity: Goal: Risk for impaired skin integrity will decrease Outcome: Progressing  Patient got up to chair during shift. No complaints of pain.

## 2019-12-14 NOTE — Progress Notes (Signed)
[facility] Progress Note  Patient: Brandon Mcadory Sr.    QVZ:[id number]  DOA: 12/11/2019     Date of Service: the patient was seen and examined on 12/14/2019  Chief Complaint  Patient presents with  . Fall   Brief hospital course: Brandon Sawyeris a 86 y.o.AA malewith medical history significant ofcoronary artery disease, congestive heart failure diastolic dysfunction, diabetes mellitus type 2, chronic anemia, hypertension, chronic kidney disease stage III, hyperlipidemia, history of DVT with IVC filter, monoclonal gammopathy, prostate cancer with bone metastasis, weight loss, came with a chief complaint of general weakness status post fall x2 at home.  Patient recently came home from rehab on 2/15 and had been living with his daughter. Since Friday he has fallen twice and daughter reported to EMS that he has hadprogressive generalized weakness, unsteadiness on his feet for atleast the last several days.  Currently further plan is to treat constipation and arrange for safe discharge.  Assessment and Plan: 1.  Recurrent fall 2 falls prior to admission last 3 days. No loss of consciousness. CT head unremarkable. No focal deficit the time of my evaluation. Check orthostatic vitals. Echocardiogram shows preserved EF no wall motion abnormality no valvular abnormality. Patient was given IV hydration at the time of admission. PT recommends SNF.  Daughter unable to take care of the patient at home.  Social worker consulted for for safe discharge planning.  2.  Hyponatremia From poor p.o. intake. Patient was given IV hydration with normalization of the sodium.  Monitor.  3.  Concern for UTI/ruled out Patient was initially empirically treated for Rocephin. We will discontinue antibiotics as no growth.  Patient does not have any urinary symptoms.  4.  CAD SP PCI On aspirin and Plavix.  Patient had hematuria with a history of prostate cancer.  Currently both are on  hold. Continue Coreg and Lipitor. No chest pain right now.  5.  Chronic diastolic CHF Currently appears euvolemic. Continue Coreg.  Monitor.  6.  Type 2 diabetes mellitus, hyperglycemia. Patient is on Levemir at home.  Continue.  Insulin sliding scale per sensitivity.  7.  History of prostate cancer. Chronic kidney disease stage IIIa. Monoclonal gammopathy Patient sees oncology outpatient. Patient is on chronic prednisone.  Also on degarelix, abiraterone  Monitor for now. Outpatient follow-up with oncology.  8.  Constipation. Increase bowel regimen.  Monitor.  9.  Obstructive sleep apnea. Continue CPAP nightly.  10 multifactorial chronic anemia. From chronic kidney disease, prostate cancer as well as low iron. H&H stable. Monitor.  11.  History of DVT. SP IVC filter placement on 11/07/2019. Patient was on Eliquis for DVT as well as aspirin and Plavix. Patient had severe hematuria requiring discontinuation of the same. Continuing to hold medications.  12.  Pathological L1 fracture. Minimally symptomatic right now. Observation. Monitor.  13. Body mass index is 31.36 kg/m.   14 deconditioning. Patient has multifactorial etiology for deconditioning. Has frequent hospitalization. Palliative care was consulted during last admission and patient wanted to be full code. Currently awaiting safe discharge to SNF as family is unable to take care of the patient at home and PT recommends SNF.  Diet: Cardiac diet DVT Prophylaxis: SCD, pharmacological prophylaxis contraindicated due to Hematuria   Advance goals of care discussion: Full code  Family Communication: no family was present at bedside, at the time of interview.   Disposition:  Pt is from home, admitted with recurrent fall, still has deconditioning which primary was the reason for patient's frequent fall, which  precludes a safe discharge. Discharge to SNF, when bed available.  Subjective: Reports constipation,  reports generalized fatigue and tiredness.  Minimal oral intake.  Reports occasional nausea but no vomiting.  Physical Exam: General:  alert oriented to time, place, and person.  Appear in mild distress, affect flat in affect Eyes: PERRL ENT: Oral Mucosa Clear, moist  Neck: no JVD,  Cardiovascular: S1 and S2 Present, no Murmur,  Respiratory: good respiratory effort, Bilateral Air entry equal and Decreased, no Crackles, no wheezes Abdomen: Bowel Sound present, Soft and no tenderness,  Skin: no rash Extremities: no Pedal edema, no calf tenderness Neurologic: without any new focal findings  Gait not checked due to patient safety concerns  Vitals:   12/14/19 1125 12/14/19 1126 12/14/19 1127 12/14/19 1642  BP: 120/87 (!) 122/54 (!) 105/36 111/72  Pulse:    74  Resp:    17  Temp:    98.3 F (36.8 C)  TempSrc:    Oral  SpO2:    100%  Weight:      Height:        Intake/Output Summary (Last 24 hours) at 12/14/2019 1902 Last data filed at 12/14/2019 1649 Gross per 24 hour  Intake 240 ml  Output 3400 ml  Net -3160 ml   Filed Weights   12/11/19 1728  Weight: 102 kg    Data Reviewed: I have personally reviewed and interpreted daily labs, tele strips, imagings as discussed above. I reviewed all nursing notes, pharmacy notes, vitals, pertinent old records I have discussed plan of care as described above with RN and patient/family.  CBC: Recent Labs  Lab 12/08/19 0025 12/11/19 1743 12/12/19 0323 12/13/19 0339 12/14/19 0318  WBC 7.7 8.1 8.4 8.7 7.8  NEUTROABS 5.1 6.5  --   --   --   HGB 11.0* 9.8* 9.5* 8.0* 8.2*  HCT 35.0* 31.5* 29.3* 24.6* 26.3*  MCV 87.7 88.0 86.7 87.9 88.3  PLT 249 230 211 194 195   Basic Metabolic Panel: Recent Labs  Lab 12/08/19 0234 12/08/19 0234 12/09/19 1338 12/11/19 1743 12/12/19 0323 12/13/19 0339 12/14/19 0318  NA 133*   < > 128* 129* 135 134* 135  K 4.0   < > 4.1 4.6 3.9 4.5 4.6  CL 98   < > 94* 91* 97* 100 101  CO2 24   < > 24 27 28  24 29   GLUCOSE 96   < > 231* 387* 378* 275* 176*  BUN 13   < > 20 17 15 13 10   CREATININE 1.33*   < > 1.62* 1.52* 1.31* 1.06 0.96  CALCIUM 9.5   < > 8.9 9.4 8.9 8.7* 9.1  MG 2.0  --   --   --   --  1.9 1.9   < > = values in this interval not displayed.    Studies: No results found.  Scheduled Meds: . abiraterone acetate  1,000 mg Oral Daily  . atorvastatin  80 mg Oral Daily  . carvedilol  25 mg Oral BID WC  . cholecalciferol  2,000 Units Oral Daily  . DULoxetine  60 mg Oral Daily  . feeding supplement (ENSURE ENLIVE)  237 mL Oral TID WC & HS  . finasteride  5 mg Oral Daily  . gabapentin  100 mg Oral BID  . insulin aspart  0-15 Units Subcutaneous TID WC  . insulin aspart  10 Units Subcutaneous TID WC  . insulin detemir  20 Units Subcutaneous Daily  . lactulose  20  g Oral BID  . multivitamin with minerals  1 tablet Oral Daily  . [START ON 12/15/2019] polyethylene glycol  17 g Oral Daily  . predniSONE  5 mg Oral Q breakfast  . senna-docusate  1 tablet Oral BID  . sodium chloride flush  3 mL Intravenous Q12H   Continuous Infusions: . sodium chloride     PRN Meds: sodium chloride, acetaminophen **OR** acetaminophen, famotidine, nitroGLYCERIN, ondansetron **OR** ondansetron (ZOFRAN) IV, oxyCODONE-acetaminophen  Time spent: 35 minutes  Author: Berle Mull, MD [facility] 12/14/2019 7:02 PM  To reach On-call, see care teams to locate the attending and reach out to them via [email or web address]. If 7PM-7AM, please contact night-coverage If you still have difficulty reaching the attending provider, please page the [facility] (Director on Call) for [facility] on amion for assistance.

## 2019-12-14 NOTE — TOC Progression Note (Addendum)
Transition of Care (TOC) - Progression Note    Patient Details  Name: Brandon Rindfleisch Sr. MRN: [id number] Date of Birth: 09-Jun-1932  Transition of Care [facility]) CM/SW Contact  Sonjia Wilcoxson, Gardiner Rhyme, LCSW Phone Number: 12/14/2019, 1:22 PM  Clinical Narrative:   Spoke with daughter to report no bed offers from facilities in [place]. Pt has two offers [place] and [facility]. Daughter feels [facility] is too far. Has accepted [facility] pt also wants this facility also. He liked it there. Have started insurance auth via [facility] and daughter to call [facility] to discuss co-pay with the business office. MD aware will need new COVID test and will work toward transfer to facility tomorrow. [facility] aware and will have a bed tomorrow. Will work on discharge plan and transfer tomorrow.   2:11 pm Sent clinicals into [facility] for approval for SNF coverage starting tomorrow.   Expected Discharge Plan: [place] Barriers to Discharge: Continued Medical Work up  Expected Discharge Plan and Services Expected Discharge Plan: [place] In-house Referral: Clinical Social Work     Living arrangements for the past 2 months: Single Family Home                                       Social Determinants of Health (SDOH) Interventions    Readmission Risk Interventions Readmission Risk Prevention Plan 11/17/2019  Transportation Screening Complete  PCP or Specialist Appt within 3-5 Days (No Data)  [place] or [place] (No Data)  Palliative Care Screening Complete  Medication Review (RN Care Manager) Complete

## 2019-12-14 NOTE — Progress Notes (Signed)
The nurse left message with patient's daughter asking if she can bring in pt's Zytiga from home.  Waiting for response.

## 2019-12-15 ENCOUNTER — Inpatient Hospital Stay: Payer: Medicare Other

## 2019-12-15 LAB — BASIC METABOLIC PANEL
Anion gap: 6 (ref 5–15)
BUN: 14 mg/dL (ref 8–23)
CO2: 28 mmol/L (ref 22–32)
Calcium: 9 mg/dL (ref 8.9–10.3)
Chloride: 100 mmol/L (ref 98–111)
Creatinine, Ser: 0.97 mg/dL (ref 0.61–1.24)
GFR calc Af Amer: >60 mL/min (ref >60)
GFR calc non Af Amer: >60 mL/min (ref >60)
Glucose, Bld: 209 mg/dL — ABNORMAL HIGH (ref 70–99)
Potassium: 4.4 mmol/L (ref 3.5–5.1)
Sodium: 134 mmol/L — ABNORMAL LOW (ref 135–145)

## 2019-12-15 LAB — CBC
HCT: 25.9 % — ABNORMAL LOW (ref 39.0–52.0)
Hemoglobin: 8.1 g/dL — ABNORMAL LOW (ref 13.0–17.0)
MCH: 27.4 pg (ref 26.0–34.0)
MCHC: 31.3 g/dL (ref 30.0–36.0)
MCV: 87.5 fL (ref 80.0–100.0)
Platelets: 189 10*3/uL (ref 150–400)
RBC: 2.96 MIL/uL — ABNORMAL LOW (ref 4.22–5.81)
RDW: 16 % — ABNORMAL HIGH (ref 11.5–15.5)
WBC: 7 10*3/uL (ref 4.0–10.5)
nRBC: 0 % (ref 0.0–0.2)

## 2019-12-15 LAB — GLUCOSE, CAPILLARY
Glucose-Capillary: 165 mg/dL — ABNORMAL HIGH (ref 70–99)
Glucose-Capillary: 325 mg/dL — ABNORMAL HIGH (ref 70–99)
Glucose-Capillary: 126 mg/dL — ABNORMAL HIGH (ref 70–99)

## 2019-12-15 LAB — MAGNESIUM: Magnesium: 2.1 mg/dL (ref 1.7–2.4)

## 2019-12-15 MED ORDER — GABAPENTIN 100 MG PO CAPS: 100.0000 mg | ORAL_CAPSULE | Freq: Two times a day (BID) | ORAL | 0 refills | Status: AC

## 2019-12-15 MED ORDER — SORBITOL 70 % SOLN
960.0000 mL | TOPICAL_OIL | Freq: Once | ORAL | Status: AC
  Administered 2019-12-15: 960 mL via RECTAL
  Filled 2019-12-15: qty 473

## 2019-12-15 MED ORDER — SENNOSIDES-DOCUSATE SODIUM 8.6-50 MG PO TABS: 1.0000 | ORAL_TABLET | Freq: Two times a day (BID) | ORAL | 0 refills | Status: AC

## 2019-12-15 MED ORDER — INSULIN DETEMIR 100 UNIT/ML FLEXPEN: 15.0000 [IU] | PEN_INJECTOR | Freq: Every day | SUBCUTANEOUS | 0 refills | Status: AC

## 2019-12-15 MED ORDER — OXYCODONE-ACETAMINOPHEN 5-325 MG PO TABS: 1.0000 | ORAL_TABLET | Freq: Three times a day (TID) | ORAL | 0 refills | Status: DC | PRN

## 2019-12-15 MED ORDER — POLYETHYLENE GLYCOL 3350 17 G PO PACK: 17.0000 g | PACK | Freq: Every day | ORAL | 0 refills | Status: AC

## 2019-12-15 NOTE — TOC Transition Note (Signed)
Transition of Care [facility]) - CM/SW Discharge Note   Patient Details  Name: Brandon Suazo Sr. MRN: [id number] Date of Birth: Sep 23, 1932  Transition of Care [facility]) CM/SW Contact:  Elease Hashimoto, LCSW Phone Number: 12/15/2019, 12:23 PM   Clinical Narrative: Pt and daughter in agreement with him going back to [facility]. Daughter aware pt will be going into co-pay days in one day at facility. She will reach out to the business office there to work out a payment plan. [employer] Everlene Balls has Josem Kaufmann [phone] from today to 2/26 with update then. CM-Stacy Derm fax [phone] for continued stay. Bedside RN-Angela aware will need to call report. Pt to have BM first before transferring. Will await RN telling this worker pt is ready to transfer. Cynthia-daughter wanted to make sure cell phone was placed in pt's black bag. Chemo drug in hospital pharm to go over with pt. Claiborne Billings and daughter aware of this.   2:25 PM-Pt has had a BM according to RN ready to call EMS. Have contacted and placed on EMS list.  Final next level of care: Skilled Nursing Facility Barriers to Discharge: Barriers Resolved   Patient Goals and CMS Choice Patient states their goals for this hospitalization and ongoing recovery are:: I don't know call [name] she knows what is going on      Discharge Placement   Existing PASRR number confirmed : 12/12/19          Patient [facility] bed at: [facility] Patient to be transferred to facility by: [place] Name of family member notified: Cynthia-daughter Patient and family notified of of transfer: 12/15/19  Discharge Plan and Services In-house Referral: Clinical Social Work                                   Social Determinants of Health ([place]) Interventions     Readmission Risk Interventions Readmission Risk Prevention Plan 11/17/2019  Transportation Screening Complete  PCP or Specialist Appt within 3-5 Days (No Data)  [place] or [place] (No  Data)  Palliative Care Screening Complete  Medication Review (RN Care Manager) Complete

## 2019-12-15 NOTE — TOC Progression Note (Signed)
Transition of Care (TOC) - Progression Note    Patient Details  Name: Brandon Downie Sr. MRN: [id number] Date of Birth: 27-Oct-1931  Transition of Care [facility]) CM/SW Contact  Mariene Dickerman, Gardiner Rhyme, LCSW Phone Number: 12/15/2019, 10:56 AM  Clinical Narrative:   MD reports pt needs to have a BM before going to facility and RN is working on this. So will be later this afternoon to go to facility. [facility] and is fine with this. She reports can take him later this afternoon. Have let daughter know also.    Expected Discharge Plan: [place] Barriers to Discharge: Continued Medical Work up  Expected Discharge Plan and Services Expected Discharge Plan: [place] In-house Referral: Clinical Social Work     Living arrangements for the past 2 months: Single Family Home                                       Social Determinants of Health (SDOH) Interventions    Readmission Risk Interventions Readmission Risk Prevention Plan 11/17/2019  Transportation Screening Complete  PCP or Specialist Appt within 3-5 Days (No Data)  [place] or [place] (No Data)  Palliative Care Screening Complete  Medication Review (RN Care Manager) Complete

## 2019-12-15 NOTE — Care Management Important Message (Signed)
Important Message  Patient Details  Name: Brandon Kempner Sr. MRN: [id number] Date of Birth: [birth year]   Medicare Important Message Given:  Yes     [name] 12/15/2019, 10:44 AM

## 2019-12-15 NOTE — Plan of Care (Signed)

## 2019-12-15 NOTE — Progress Notes (Addendum)
Occupational Therapy Treatment Patient Details Name: Brandon Ysaguirre Sr. MRN: [id number] DOB: 24-Aug-1932 Today's Date: 12/15/2019    History of present illness Brandon Cowin Sr. is a 86 y.o. male with medical history significant of coronary artery disease, congestive heart failure diastolic dysfunction, diabetes mellitus type 2, chronic anemia, hypertension, chronic kidney disease stage III, hyperlipidemia, history of DVT with IVC filter, monoclonal gammopathy, prostate cancer with bone metastasis, weight loss, came with a chief complaint of general weakness status post fall x2 at home. Concerned for possible UTI, but labwork negative; CT scan negative for acute abnormality;   OT comments  [name] was seen for OT treatment on this date. Pt received semi-supine in bed yelling "help". Upon seeing this [name] pt states he was "throwing up". Pt is noted to have pulled small trash can to bedside and appears to have used it as a receptacle. RN immediately notified, and informs this [name] pt has been generally confused this date. This [name] provides pt an emesis bag, however pt does not experience further emesis during session. Pt agreeable to attempting grooming at bed level. This [name] provides set-up assist. See ADL section below for additional details. Pt endorses "feeling a little better" at end of session with RN in room to remove pt IV. Pt progressing toward goals and continues to benefit from skilled OT services to maximize return to PLOF and minimize risk of future falls, injury, caregiver burden, and readmission. Will continue to follow POC as written. Discharge recommendation remains appropriate.    Follow Up Recommendations  SNF;Supervision/Assistance - 24 hour    Equipment Recommendations  Other (comment)(Defer to next level of care)    Recommendations for Other Services      Precautions / Restrictions Precautions Precautions: Fall Restrictions Weight Bearing  Restrictions: No       Mobility Bed Mobility Overal bed mobility: Modified Independent             General bed mobility comments: Pt able to adjust trunk/BLE in bed. However functional ability appears to fluctuate with level of cognition.  Transfers Overall transfer level: Needs assistance               General transfer comment: Deferred for pt comfort/safety.    Balance Overall balance assessment: Needs assistance Sitting-balance support: Feet supported;Bilateral upper extremity supported Sitting balance-Leahy Scale: Fair                                     ADL either performed or assessed with clinical judgement   ADL Overall ADL's : Needs assistance/impaired                                       General ADL Comments: Pt performs UB grooming tasks at bed level this date. He is able to perform face washing and hand hygiene given set-up assist from therapist. Pt continues to be functionally limited by cognition, decreased balance, and generalized weakness. He requires moderate assist for LBD, set-up to min A for UB dressing.     Vision Patient Visual Report: No change from baseline     Perception     Praxis      Cognition Arousal/Alertness: Awake/alert Behavior During Therapy: WFL for tasks assessed/performed;Restless;Agitated Overall Cognitive Status: No family/caregiver present to determine baseline cognitive functioning  General Comments: Pt received semi-supine in bed yelling "help". Pt states he "was throwing up" and does appear to have pulled room trash can near bedside to use as receptacle. This author provides emesis bag for pt. Nsg immediately notified. Per nsg pt has been confused t/o the day.  Pt A&O to self, place, limited time and limited situation.        Exercises Other Exercises Other Exercises: OT egages pt in functional grooming task at bed level this date. See  ADL section for detail.   Shoulder Instructions       General Comments      Pertinent Vitals/ Pain       Pain Assessment: No/denies pain  Home Living                                          Prior Functioning/Environment              Frequency  Min 2X/week        Progress Toward Goals  OT Goals(current goals can now be found in the care plan section)  Progress towards OT goals: Progressing toward goals  Acute Rehab OT Goals Patient Stated Goal: to get stronger OT Goal Formulation: With patient/family Time For Goal Achievement: 12/27/19 Potential to Achieve Goals: Good  Plan Discharge plan remains appropriate;Frequency remains appropriate    Co-evaluation                 AM-PAC OT "6 Clicks" Daily Activity     Outcome Measure   Help from another person eating meals?: None Help from another person taking care of personal grooming?: A Little Help from another person toileting, which includes using toliet, bedpan, or urinal?: A Lot Help from another person bathing (including washing, rinsing, drying)?: A Lot Help from another person to put on and taking off regular upper body clothing?: A Little Help from another person to put on and taking off regular lower body clothing?: A Lot 6 Click Score: 16    End of Session    OT Visit Diagnosis: Unsteadiness on feet (R26.81);Muscle weakness (generalized) (M62.81);Repeated falls (R29.6)   Activity Tolerance Patient tolerated treatment well   Patient Left in bed;with call bell/phone within reach;with bed alarm set;with nursing/sitter in room   Nurse Communication Other (comment)(Pt calling out for help upon OT arrival in room. Pt states he was "throwing up")        Time: 5465-6812 OT Time Calculation (min): 11 min  Charges: OT General Charges $OT Visit: 1 Visit OT Treatments $Self Care/Home Management : 8-22 mins  Shara Blazing, M.S., OTR/L Ascom: [phone] 12/15/19, 4:02  PM

## 2019-12-15 NOTE — Progress Notes (Signed)
The nurse called report to facility nurse; Janett Billow, LPN

## 2019-12-15 NOTE — TOC Progression Note (Addendum)
Transition of Care (TOC) - Progression Note    Patient Details  Name: Izzy Courville Sr. MRN: [id number] Date of Birth: Feb 18, 1932  Transition of Care [facility]) CM/SW Contact  Faren Florence, Gardiner Rhyme, LCSW Phone Number: 12/15/2019, 8:37 AM  Clinical Narrative:   Daughter and pt decided upon [facility] have sent off insurance auth ref [id number] and received auth ladt night number [id number] starting 2/24 with three days, next review 2/26. CM-Stacy Derm fax [phone]. Daughter made aware of this plan, along with MD. Will work toward transfer to [facility] once DC summary completed. COVID test negative.   9:00 Daughter wants to make sure pt's cellphone is packed to go with him since last time it was lost in the transfer. Angela-RN is aware of this.  Expected Discharge Plan: [place] Barriers to Discharge: Continued Medical Work up  Expected Discharge Plan and Services Expected Discharge Plan: [place] In-house Referral: Clinical Social Work     Living arrangements for the past 2 months: Single Family Home                                       Social Determinants of Health (SDOH) Interventions    Readmission Risk Interventions Readmission Risk Prevention Plan 11/17/2019  Transportation Screening Complete  PCP or Specialist Appt within 3-5 Days (No Data)  [place] or [place] (No Data)  Palliative Care Screening Complete  Medication Review (RN Care Manager) Complete

## 2019-12-15 NOTE — Discharge Summary (Addendum)
[facility] Discharge Summary   Patient: Brandon Nofziger Sr. HWE:[id number]  PCP: Hillis Range  Date of admission: 12/11/2019   Date of discharge:  12/15/2019     Discharge Diagnoses:  Principal Problem:   Frequent falls Active Problems:   CAD (coronary artery disease)   Insulin dependent type 2 diabetes mellitus ([place])   Essential hypertension   Chronic anticoagulation   CKD (chronic kidney disease) stage 3, GFR 30-59 ml/min   Generalized weakness   Chronic diastolic CHF (congestive heart failure) (H[facility])   OSA on CPAP   Dyslipidemia   DVT (deep venous thrombosis) ([facility])   Prostate cancer metastatic to bone ([place])   Monoclonal gammopathy   Hyponatremia   Acute lower UTI   Multiple falls   Admitted From: home Disposition:  SNF   Recommendations for Outpatient Follow-up:  1. PCP: please follow up with PCP in 1 week 2. Follow up LABS/TEST:  none   Contact information for follow-up providers    [name], PA-C. Schedule an appointment as soon as possible for a visit in 1 week(s).   Specialty: Urgent Care Contact information: [address], [place] [address] [phone]            Contact information for after-discharge care    [facility] .   Service: Skilled Nursing Contact information: [place] [phone]                 Diet recommendation: Cardiac diet  Activity: The patient is advised to gradually reintroduce usual activities, as tolerated  Discharge Condition: stable  Code Status: Full code   History of present illness: As per the H and P dictated on admission, "Brandon Wager Sr. is a 86 y.o. male with medical history significant of coronary artery disease, congestive heart failure diastolic dysfunction, diabetes mellitus type 2, chronic anemia, hypertension, chronic kidney disease stage III, hyperlipidemia, history of DVT  with IVC filter, monoclonal gammopathy, prostate cancer with bone metastasis, weight loss, came with a chief complaint of general weakness status post fall x2 at home. His only history could get from him is he fell twice in the bathroom and did not lose his consciousness. Per emergency room physician history from his daughter Patient recently came home from rehab on 2/15 and is living with his daughter. Since Friday he has fallen twice and daughter reported to EMS that he has hadprogressive generalized weakness, unsteadiness on his feet for atleast the last several days.  Patient himselfreports falling because he turned awkwardlyin the restroom reachingfor a towel, causing him to lose his balance. Denies preceding presyncopal symptoms,specifically denies any preceding chest pain, dizziness, lightheadedness, palpitations.He states on Friday he fell and landed on his back, denies any pain related to this. Today he landed on his left side, again denies any pain related to this. Denies any head injuries or resultant loss of consciousness. He is on blood thinning medication."  Hospital Course:  Summary of his active problems in the hospital is as following.   1.  Recurrent fall 2 falls prior to admission last 3 days. No loss of consciousness. CT head unremarkable. No focal deficit the time of my evaluation. Check orthostatic vitals. Echocardiogram shows preserved EF no wall motion abnormality no valvular abnormality. Patient was given IV hydration at the time of admission. PT recommends SNF.  Daughter unable to take care of the patient at home.  Social worker  consulted for for safe discharge planning.  2.  Hyponatremia From poor p.o. intake. Patient was given IV hydration with normalization of the sodium.  Monitor.  3.  Concern for UTI/ruled out Patient was initially empirically treated for Rocephin. We will discontinue antibiotics as no growth.  Patient does not have any urinary  symptoms.  4.  CAD SP PCI On aspirin and Plavix.  Patient had hematuria with a history of prostate cancer.  Currently both are on hold. Continue Coreg and Lipitor. No chest pain right now.  5.  Chronic diastolic CHF Currently appears euvolemic. Continue Coreg.  Monitor.  6.  Type 2 diabetes mellitus, hyperglycemia. Patient is on Levemir at home.  Continue.  Insulin sliding scale per sensitivity.  7.  History of prostate cancer. Chronic kidney disease stage IIIa. Monoclonal gammopathy Patient sees oncology outpatient. Patient is on chronic prednisone.  Also on degarelix, abiraterone  Monitor for now. Outpatient follow-up with oncology.  8.  Constipation. Increase bowel regimen.  Monitor. Enema given  9.  Obstructive sleep apnea. Continue CPAP nightly.  10 multifactorial chronic anemia. From chronic kidney disease, prostate cancer as well as low iron. H&H stable. Monitor.  11.  History of DVT. SP IVC filter placement on 11/07/2019. Patient was on Eliquis for DVT as well as aspirin and Plavix. Patient had severe hematuria requiring discontinuation of the same. Continuing to hold medications.  12.  Pathological L1 fracture. Minimally symptomatic right now. Observation. Monitor.  13. Body mass index is 31.36 kg/m.   14. Deconditioning. Patient has multifactorial etiology for deconditioning. Has frequent hospitalization. Palliative care was consulted during last admission and patient wanted to be full code. Currently awaiting safe discharge to SNF as family is unable to take care of the patient at home and PT recommends SNF.  15. Peripheral neuropathy  Pt reported numbness since last 5 years, progressively worsening. Gabapentin added.   16. Pt on percocet per med rec but per PDMP, I do not see any pain meds that this pt is receiving. Pt has not used the med here in the hospital. I have d/c this med for now. New med gabapentin already ordered.   Patient  was seen by physical therapy, who recommended SNF, which was arranged. On the day of the discharge the patient's vitals were stable, and no other acute medical condition were reported by patient. the patient was felt safe to be discharge at SNF with SNF.  Consultants: none Procedures: none  Discharge Exam: General: Appear in no distress, no Rash; Oral Mucosa Clear, moist. Cardiovascular: S1 and S2 Present, no Murmur, Respiratory: normal respiratory effort, Bilateral Air entry present and no Crackles, no wheezes Abdomen: Bowel Sound present, Soft and no tenderness, no hernia Extremities: no Pedal edema, no calf tenderness Neurology: alert and oriented to time, place, and person affect appropriate.  Filed Weights   12/11/19 1728  Weight: 102 kg   Vitals:   12/14/19 2326 12/15/19 0758  BP: (!) 120/56 (!) 139/57  Pulse: 64 64  Resp: 17 17  Temp: 98.4 F (36.9 C) 98.2 F (36.8 C)  SpO2: 100% 94%    DISCHARGE MEDICATION: Allergies as of 12/15/2019      Reactions   Metformin And Related Diarrhea   Intolerance due to naturally loose bowels      Medication List    STOP taking these medications   oxyCODONE-acetaminophen 5-325 MG tablet Commonly known as: PERCOCET/ROXICET     TAKE these medications   abiraterone acetate 250 MG tablet Commonly  known as: ZYTIGA TAKE 4 TABLETS (1,000 MG TOTAL) BY MOUTH DAILY. TAKE ON AN EMPTY STOMACH 1 HOUR BEFORE OR 2 HOURS AFTER A MEAL   atorvastatin 80 MG tablet Commonly known as: LIPITOR Take 80 mg by mouth daily.   carvedilol 25 MG tablet Commonly known as: COREG Take 25 mg by mouth 2 (two) times daily with a meal.   DULoxetine 60 MG capsule Commonly known as: CYMBALTA Take 60 mg by mouth daily.   famotidine 20 MG tablet Commonly known as: PEPCID Take 20 mg by mouth at bedtime as needed for heartburn.   feeding supplement (ENSURE ENLIVE) Liqd Take 237 mLs by mouth 4 (four) times daily -  with meals and at bedtime.    finasteride 5 MG tablet Commonly known as: PROSCAR Take 5 mg by mouth daily.   gabapentin 100 MG capsule Commonly known as: NEURONTIN Take 1 capsule (100 mg total) by mouth 2 (two) times daily.   insulin aspart 100 UNIT/ML injection Commonly known as: novoLOG Inject 5 Units into the skin 3 (three) times daily with meals.   Insulin Detemir 100 UNIT/ML Pen Commonly known as: LEVEMIR Inject 15 Units into the skin daily.   Multi-Vitamin tablet Take 1 tablet by mouth daily.   nitroGLYCERIN 0.4 MG SL tablet Commonly known as: NITROSTAT Place 0.4 mg under the tongue every 5 (five) minutes as needed for chest pain.   polyethylene glycol 17 g packet Commonly known as: MIRALAX / GLYCOLAX Take 17 g by mouth daily. Start taking on: December 16, 2019   predniSONE 5 MG tablet Commonly known as: DELTASONE TAKE 1 TABLET BY MOUTH DAILY WITH BREAKFAST. BEGIN TAKING WITH INITIATION OF ABIRATERONE.   senna-docusate 8.6-50 MG tablet Commonly known as: Senokot-S Take 1 tablet by mouth 2 (two) times daily.   Vitamin D 50 MCG (2000 UT) tablet Take 2,000 Units by mouth daily.      Allergies  Allergen Reactions  . Metformin And Related Diarrhea    Intolerance due to naturally loose bowels   Discharge Instructions    Diet - low sodium heart healthy   Complete by: As directed    Increase activity slowly   Complete by: As directed       The results of significant diagnostics from this hospitalization (including imaging, microbiology, ancillary and laboratory) are listed below for reference.    Significant Diagnostic Studies: CT Head Wo Contrast  Result Date: 12/11/2019 CLINICAL DATA:  86 year old male with fall. EXAM: CT HEAD WITHOUT CONTRAST TECHNIQUE: Contiguous axial images were obtained from the base of the skull through the vertex without intravenous contrast. COMPARISON:  Head CT dated 10/15/2019. FINDINGS: Brain: There is mild age-related atrophy and chronic microvascular  ischemic changes. There is no acute intracranial hemorrhage. No mass effect or midline shift. No extra-axial fluid collection. Vascular: No hyperdense vessel or unexpected calcification. Skull: Normal. Negative for fracture or focal lesion. Sinuses/Orbits: No acute finding. Other: None IMPRESSION: No acute intracranial hemorrhage. Electronically Signed   By: Anner Crete M.D.   On: 12/11/2019 20:54   DG Abd Portable 1V  Result Date: 12/15/2019 CLINICAL DATA:  Constipation. EXAM: PORTABLE ABDOMEN - 1 VIEW COMPARISON:  CT stone study 11/05/2019 FINDINGS: Mild diffuse gaseous small bowel distension noted. There is diffuse gaseous distention of colon with large stool volume throughout. IVC filter identified in situ. Phleboliths overlie the pelvis. Bones are diffusely demineralized. IMPRESSION: Mild small bowel distension with diffuse gas and stool distended colon. Imaging features would be compatible with clinical  constipation. Electronically Signed   By: Misty Stanley M.D.   On: 12/15/2019 10:22    Microbiology: Recent Results (from the past 240 hour(s))  SARS CORONAVIRUS 2 (TAT 6-24 HRS) Nasopharyngeal Nasopharyngeal Swab     Status: None   Collection Time: 12/11/19 11:03 PM   Specimen: Nasopharyngeal Swab  Result Value Ref Range Status   SARS Coronavirus 2 NEGATIVE NEGATIVE Final    Comment: (NOTE) SARS-CoV-2 target nucleic acids are NOT DETECTED. The SARS-CoV-2 RNA is generally detectable in upper and lower respiratory specimens during the acute phase of infection. Negative results do not preclude SARS-CoV-2 infection, do not rule out co-infections with other pathogens, and should not be used as the sole basis for treatment or other patient management decisions. Negative results must be combined with clinical observations, patient history, and epidemiological information. The expected result is Negative. Fact Sheet for Patients: SugarRoll.be Fact Sheet for  Healthcare Providers: https://www.woods-mathews.com/ This test is not yet approved or cleared by the [place] FDA and  has been authorized for detection and/or diagnosis of SARS-CoV-2 by FDA under an Emergency Use Authorization (EUA). This EUA will remain  in effect (meaning this test can be used) for the duration of the COVID-19 declaration under Section 56 4(b)(1) of the Act, 21 U.S.C. section 360bbb-3(b)(1), unless the authorization is terminated or revoked sooner. Performed at [facility], [place] [address], [place], [place] [address]   SARS CORONAVIRUS 2 (TAT 6-24 HRS) Nasopharyngeal Nasopharyngeal Swab     Status: None   Collection Time: 12/14/19  1:39 PM   Specimen: Nasopharyngeal Swab  Result Value Ref Range Status   SARS Coronavirus 2 NEGATIVE NEGATIVE Final    Comment: (NOTE) SARS-CoV-2 target nucleic acids are NOT DETECTED. The SARS-CoV-2 RNA is generally detectable in upper and lower respiratory specimens during the acute phase of infection. Negative results do not preclude SARS-CoV-2 infection, do not rule out co-infections with other pathogens, and should not be used as the sole basis for treatment or other patient management decisions. Negative results must be combined with clinical observations, patient history, and epidemiological information. The expected result is Negative. Fact Sheet for Patients: SugarRoll.be Fact Sheet for Healthcare Providers: https://www.woods-mathews.com/ This test is not yet approved or cleared by the [place] FDA and  has been authorized for detection and/or diagnosis of SARS-CoV-2 by FDA under an Emergency Use Authorization (EUA). This EUA will remain  in effect (meaning this test can be used) for the duration of the COVID-19 declaration under Section 56 4(b)(1) of the Act, 21 U.S.C. section 360bbb-3(b)(1), unless the authorization is terminated or revoked  sooner. Performed at [facility], [place] [address], [place], [place] [address]      Labs: CBC: Recent Labs  Lab 12/11/19 1743 12/12/19 0323 12/13/19 0339 12/14/19 0318 12/15/19 0331  WBC 8.1 8.4 8.7 7.8 7.0  NEUTROABS 6.5  --   --   --   --   HGB 9.8* 9.5* 8.0* 8.2* 8.1*  HCT 31.5* 29.3* 24.6* 26.3* 25.9*  MCV 88.0 86.7 87.9 88.3 87.5  PLT 230 211 194 191 263   Basic Metabolic Panel: Recent Labs  Lab 12/11/19 1743 12/12/19 0323 12/13/19 0339 12/14/19 0318 12/15/19 0331  NA 129* 135 134* 135 134*  K 4.6 3.9 4.5 4.6 4.4  CL 91* 97* 100 101 100  CO2 27 28 24 29 28   GLUCOSE 387* 378* 275* 176* 209*  BUN 17 15 13 10 14   CREATININE 1.52* 1.31* 1.06 0.96  0.97  CALCIUM 9.4 8.9 8.7* 9.1 9.0  MG  --   --  1.9 1.9 2.1   Liver Function Tests: Recent Labs  Lab 12/11/19 1743 12/12/19 0323  AST 20 20  ALT 24 21  ALKPHOS 281* 269*  BILITOT 0.8 0.5  PROT 7.0 6.7  ALBUMIN 3.4* 3.2*   No results for input(s): LIPASE, AMYLASE in the last 168 hours. No results for input(s): AMMONIA in the last 168 hours. Cardiac Enzymes: No results for input(s): CKTOTAL, CKMB, CKMBINDEX, TROPONINI in the last 168 hours. BNP (last 3 results) Recent Labs    10/14/19 2338 11/06/19 0514  BNP 114.0* 180.0*   CBG: Recent Labs  Lab 12/14/19 1140 12/14/19 1643 12/14/19 2137 12/15/19 0758 12/15/19 1145  GLUCAP 184* 255* 199* 165* 325*    Time spent: 35 minutes  Signed:  Berle Mull  [facility]  12/15/2019 12:12 PM

## 2019-12-15 NOTE — Progress Notes (Signed)
The nurse put pt home medication, Abiraterone Acetate tablets in pt's black personal belonging bag at pt bedside.

## 2019-12-15 NOTE — Plan of Care (Signed)
Problem: Education: Goal: Knowledge of General Education information will improve Description: Including pain rating scale, medication(s)/side effects and non-pharmacologic comfort measures 12/15/2019 1300 by Blenda Bridegroom, RN Outcome: Completed/Met 12/15/2019 0746 by Blenda Bridegroom, RN Outcome: Progressing   Problem: Health Behavior/Discharge Planning: Goal: Ability to manage health-related needs will improve 12/15/2019 1300 by Blenda Bridegroom, RN Outcome: Completed/Met 12/15/2019 0746 by Blenda Bridegroom, RN Outcome: Progressing   Problem: Clinical Measurements: Goal: Ability to maintain clinical measurements within normal limits will improve 12/15/2019 1300 by Blenda Bridegroom, RN Outcome: Completed/Met 12/15/2019 0746 by Blenda Bridegroom, RN Outcome: Progressing Goal: Will remain free from infection 12/15/2019 1300 by Blenda Bridegroom, RN Outcome: Completed/Met 12/15/2019 0746 by Blenda Bridegroom, RN Outcome: Progressing Goal: Diagnostic test results will improve 12/15/2019 1300 by Blenda Bridegroom, RN Outcome: Completed/Met 12/15/2019 0746 by Blenda Bridegroom, RN Outcome: Progressing Goal: Respiratory complications will improve 12/15/2019 1300 by Blenda Bridegroom, RN Outcome: Completed/Met 12/15/2019 0746 by Blenda Bridegroom, RN Outcome: Progressing Goal: Cardiovascular complication will be avoided 12/15/2019 1300 by Blenda Bridegroom, RN Outcome: Completed/Met 12/15/2019 0746 by Blenda Bridegroom, RN Outcome: Progressing   Problem: Activity: Goal: Risk for activity intolerance will decrease 12/15/2019 1300 by Blenda Bridegroom, RN Outcome: Completed/Met 12/15/2019 0746 by Blenda Bridegroom, RN Outcome: Progressing   Problem: Nutrition: Goal: Adequate nutrition will be maintained 12/15/2019 1300 by Blenda Bridegroom, RN Outcome: Completed/Met 12/15/2019 0746 by Blenda Bridegroom, RN Outcome: Progressing   Problem: Elimination: Goal: Will not experience complications related to bowel motility 12/15/2019  1300 by Blenda Bridegroom, RN Outcome: Completed/Met 12/15/2019 0746 by Blenda Bridegroom, RN Outcome: Progressing Goal: Will not experience complications related to urinary retention 12/15/2019 1300 by Blenda Bridegroom, RN Outcome: Completed/Met 12/15/2019 0746 by Blenda Bridegroom, RN Outcome: Progressing   Problem: Safety: Goal: Ability to remain free from injury will improve 12/15/2019 1300 by Blenda Bridegroom, RN Outcome: Completed/Met 12/15/2019 0746 by Blenda Bridegroom, RN Outcome: Progressing   Problem: Skin Integrity: Goal: Risk for impaired skin integrity will decrease 12/15/2019 1300 by Blenda Bridegroom, RN Outcome: Completed/Met 12/15/2019 0746 by Blenda Bridegroom, RN Outcome: Progressing   Problem: Education: Goal: Knowledge of General Education information will improve Description: Including pain rating scale, medication(s)/side effects and non-pharmacologic comfort measures 12/15/2019 1300 by Blenda Bridegroom, RN Outcome: Completed/Met 12/15/2019 0746 by Blenda Bridegroom, RN Outcome: Progressing   Problem: Health Behavior/Discharge Planning: Goal: Ability to manage health-related needs will improve 12/15/2019 1300 by Blenda Bridegroom, RN Outcome: Completed/Met 12/15/2019 0746 by Blenda Bridegroom, RN Outcome: Progressing   Problem: Clinical Measurements: Goal: Ability to maintain clinical measurements within normal limits will improve 12/15/2019 1300 by Blenda Bridegroom, RN Outcome: Completed/Met 12/15/2019 0746 by Blenda Bridegroom, RN Outcome: Progressing Goal: Will remain free from infection 12/15/2019 1300 by Blenda Bridegroom, RN Outcome: Completed/Met 12/15/2019 0746 by Blenda Bridegroom, RN Outcome: Progressing Goal: Diagnostic test results will improve 12/15/2019 1300 by Blenda Bridegroom, RN Outcome: Completed/Met 12/15/2019 0746 by Blenda Bridegroom, RN Outcome: Progressing Goal: Respiratory complications will improve 12/15/2019 1300 by Blenda Bridegroom, RN Outcome: Completed/Met 12/15/2019 0746 by  Blenda Bridegroom, RN Outcome: Progressing Goal: Cardiovascular complication will be avoided 12/15/2019 1300 by Blenda Bridegroom, RN Outcome: Completed/Met 12/15/2019 0746 by Blenda Bridegroom, RN Outcome: Progressing   Problem: Activity: Goal: Risk for activity intolerance will decrease 12/15/2019 1300 by Blenda Bridegroom, RN Outcome:  Completed/Met 12/15/2019 0746 by Blenda Bridegroom, RN Outcome: Progressing   Problem: Nutrition: Goal: Adequate nutrition will be maintained 12/15/2019 1300 by Blenda Bridegroom, RN Outcome: Completed/Met 12/15/2019 0746 by Blenda Bridegroom, RN Outcome: Progressing   Problem: Elimination: Goal: Will not experience complications related to bowel motility 12/15/2019 1300 by Blenda Bridegroom, RN Outcome: Completed/Met 12/15/2019 0746 by Blenda Bridegroom, RN Outcome: Progressing Goal: Will not experience complications related to urinary retention 12/15/2019 1300 by Blenda Bridegroom, RN Outcome: Completed/Met 12/15/2019 0746 by Blenda Bridegroom, RN Outcome: Progressing   Problem: Safety: Goal: Ability to remain free from injury will improve 12/15/2019 1300 by Blenda Bridegroom, RN Outcome: Completed/Met 12/15/2019 0746 by Blenda Bridegroom, RN Outcome: Progressing   Problem: Skin Integrity: Goal: Risk for impaired skin integrity will decrease 12/15/2019 1300 by Blenda Bridegroom, RN Outcome: Completed/Met 12/15/2019 0746 by Blenda Bridegroom, RN Outcome: Progressing

## 2019-12-16 ENCOUNTER — Encounter (INDEPENDENT_AMBULATORY_CARE_PROVIDER_SITE_OTHER): Payer: Self-pay

## 2019-12-16 ENCOUNTER — Ambulatory Visit (INDEPENDENT_AMBULATORY_CARE_PROVIDER_SITE_OTHER): Payer: Self-pay | Admitting: Nurse Practitioner

## 2019-12-22 ENCOUNTER — Inpatient Hospital Stay: Payer: Medicare Other

## 2019-12-23 ENCOUNTER — Inpatient Hospital Stay: Payer: Medicare Other

## 2019-12-23 ENCOUNTER — Inpatient Hospital Stay: Payer: Medicare Other | Admitting: Hematology and Oncology

## 2019-12-30 ENCOUNTER — Other Ambulatory Visit: Payer: Self-pay | Admitting: Hematology and Oncology

## 2019-12-30 DIAGNOSIS — C61 Malignant neoplasm of prostate: Secondary | ICD-10-CM

## 2019-12-30 DIAGNOSIS — C7951 Secondary malignant neoplasm of bone: Secondary | ICD-10-CM

## 2020-01-02 ENCOUNTER — Emergency Department
Admission: EM | Admit: 2020-01-02 | Discharge: 2020-01-02 | Disposition: A | Discharge status: Home / Self Care | Payer: Medicare Other | Attending: Emergency Medicine | Admitting: Emergency Medicine

## 2020-01-02 ENCOUNTER — Other Ambulatory Visit: Payer: Self-pay

## 2020-01-02 DIAGNOSIS — I130 Hypertensive heart and chronic kidney disease with heart failure and stage 1 through stage 4 chronic kidney disease, or unspecified chronic kidney disease: Secondary | ICD-10-CM | POA: Insufficient documentation

## 2020-01-02 DIAGNOSIS — I5032 Chronic diastolic (congestive) heart failure: Secondary | ICD-10-CM | POA: Diagnosis not present

## 2020-01-02 DIAGNOSIS — W06XXXA Fall from bed, initial encounter: Secondary | ICD-10-CM | POA: Insufficient documentation

## 2020-01-02 DIAGNOSIS — E1122 Type 2 diabetes mellitus with diabetic chronic kidney disease: Secondary | ICD-10-CM | POA: Diagnosis not present

## 2020-01-02 DIAGNOSIS — I252 Old myocardial infarction: Secondary | ICD-10-CM | POA: Insufficient documentation

## 2020-01-02 DIAGNOSIS — Z043 Encounter for examination and observation following other accident: Secondary | ICD-10-CM | POA: Insufficient documentation

## 2020-01-02 DIAGNOSIS — J45909 Unspecified asthma, uncomplicated: Secondary | ICD-10-CM | POA: Insufficient documentation

## 2020-01-02 DIAGNOSIS — W19XXXA Unspecified fall, initial encounter: Secondary | ICD-10-CM

## 2020-01-02 DIAGNOSIS — Z794 Long term (current) use of insulin: Secondary | ICD-10-CM | POA: Diagnosis not present

## 2020-01-02 DIAGNOSIS — N183 Chronic kidney disease, stage 3 unspecified: Secondary | ICD-10-CM | POA: Diagnosis not present

## 2020-01-02 DIAGNOSIS — Z85830 Personal history of malignant neoplasm of bone: Secondary | ICD-10-CM | POA: Diagnosis not present

## 2020-01-02 DIAGNOSIS — J449 Chronic obstructive pulmonary disease, unspecified: Secondary | ICD-10-CM | POA: Diagnosis not present

## 2020-01-02 DIAGNOSIS — Z8546 Personal history of malignant neoplasm of prostate: Secondary | ICD-10-CM | POA: Diagnosis not present

## 2020-01-02 DIAGNOSIS — Z79899 Other long term (current) drug therapy: Secondary | ICD-10-CM | POA: Diagnosis not present

## 2020-01-02 DIAGNOSIS — Z7901 Long term (current) use of anticoagulants: Secondary | ICD-10-CM | POA: Diagnosis not present

## 2020-01-02 DIAGNOSIS — I2510 Atherosclerotic heart disease of native coronary artery without angina pectoris: Secondary | ICD-10-CM | POA: Diagnosis not present

## 2020-01-02 DIAGNOSIS — Z87891 Personal history of nicotine dependence: Secondary | ICD-10-CM | POA: Diagnosis not present

## 2020-01-02 NOTE — ED Notes (Signed)
Pt given graham crackers and orange juice per request

## 2020-01-02 NOTE — ED Provider Notes (Signed)
[facility] Emergency Department Provider Note  ____________________________________________   First MD Initiated Contact with Patient 01/02/20 [phone]     (approximate)  I have reviewed the triage vital signs and the nursing notes.   HISTORY  Chief Complaint Fall    HPI Brandon Bolle Sr. is a 86 y.o. male  with medical history as listed below who presents for evaluation after a fall at his nursing facility [facility]).  He reports that he was trying to get out of bed and reached for his wheelchair, but states it was not secured with a brake, so it rolled or flipped which caused him to lose his balance.  He fell and struck his left arm and the top of his head on the floor.  He denies loss of consciousness.  He says he feels fine and has no pain.  He denies headache, neck pain, chest pain, shortness of breath, nausea, vomiting, and abdominal pain.  He has no pain in his arms or his legs.  He is fully alert and oriented and making jokes with me.   He is not on any blood thinners.  The onset was acute but the situation was minor.        Past Medical History:  Diagnosis Date  . Anemia, normocytic normochromic   . BPH (benign prostatic hyperplasia)   . CAD (coronary artery disease)   . Cataracts, bilateral   . Chronic renal insufficiency   . Colon polyp   . COPD (chronic obstructive pulmonary disease) ([place])   . Diabetes mellitus without complication ([place])   . DVT (deep venous thrombosis) ([place])   . Edema of both legs   . Glaucoma   . Gout   . Heart murmur   . Hepatitis C antibody test positive 10/20/2019  . Hyperlipidemia   . Hypertension   . Moderate persistent asthma without complication   . Myocardial infarction ([place])   . Neuromuscular disorder ([place])   . Neuropathy   . Peripheral vascular disease ([place])   . Premature ejaculation   . Prostate cancer ([place])   . RLS (restless legs syndrome)   . Sleep apnea   . Stented coronary artery 11/13/2015   . Uncontrolled type 2 diabetes mellitus with stage 3 chronic kidney disease, with long-term current use of insulin [facility])     Patient Active Problem List   Diagnosis Date Noted  . Multiple falls 12/12/2019  . Hyponatremia 12/11/2019  . Acute lower UTI 12/11/2019  . Monoclonal gammopathy   . Palliative care by specialist   . Goals of care, counseling/discussion   . Prostate cancer metastatic to bone ([place]) 11/07/2019  . Elevated INR 11/05/2019  . Acute blood loss anemia 11/05/2019  . OSA on CPAP 11/05/2019  . Dyslipidemia 11/05/2019  . Morbid obesity ([place]) 11/05/2019  . DVT (deep venous thrombosis) ([place]) 11/05/2019  . Hematuria 11/04/2019  . Hepatitis C antibody test positive 10/20/2019  . Anasarca 10/15/2019  . Acute exacerbation of CHF (congestive heart failure) ([place]) 10/15/2019  . CAD (coronary artery disease) 10/15/2019  . Insulin dependent type 2 diabetes mellitus ([place]) 10/15/2019  . Essential hypertension 10/15/2019  . Chronic anticoagulation 10/15/2019  . Acute kidney injury superimposed on CKD ([place]) 10/15/2019  . CKD (chronic kidney disease) stage 3, GFR 30-59 ml/min 10/15/2019  . Generalized weakness 10/15/2019  . Frequent falls 10/15/2019  . Elevated liver enzymes 10/15/2019  . Chronic diastolic CHF (congestive heart failure) ([place]) 10/15/2019  . Elevated LFTs     Past  Surgical History:  Procedure Laterality Date  . APPENDECTOMY    . [place]  2009  . COLONOSCOPY  2012  . EYE SURGERY    . IVC FILTER INSERTION N/A 11/07/2019   Procedure: IVC FILTER INSERTION;  Surgeon: Algernon Huxley, MD;  Location: [place] [facility];  Service: Cardiovascular;  Laterality: N/A;  . PROSTATE SURGERY    . TONSILLECTOMY      Prior to Admission medications   Medication Sig Start Date End Date Taking? Authorizing Provider  abiraterone acetate (ZYTIGA) 250 MG tablet TAKE 4 TABLETS (1,000 MG TOTAL) BY MOUTH DAILY. TAKE ON AN EMPTY STOMACH 1 HOUR BEFORE OR 2 HOURS AFTER A  MEAL 12/06/19   Lequita Asal, MD  atorvastatin (LIPITOR) 80 MG tablet Take 80 mg by mouth daily. 11/15/15   [provider]  carvedilol (COREG) 25 MG tablet Take 25 mg by mouth 2 (two) times daily with a meal.    [provider]  Cholecalciferol (VITAMIN D) 50 MCG (2000 UT) tablet Take 2,000 Units by mouth daily.     [provider]  DULoxetine (CYMBALTA) 60 MG capsule Take 60 mg by mouth daily.    [provider]  famotidine (PEPCID) 20 MG tablet Take 20 mg by mouth at bedtime as needed for heartburn.  08/26/19 08/25/20  [provider]  feeding supplement, ENSURE ENLIVE, (ENSURE ENLIVE) LIQD Take 237 mLs by mouth 4 (four) times daily -  with meals and at bedtime. 11/17/19   Fritzi Mandes, MD  finasteride (PROSCAR) 5 MG tablet Take 5 mg by mouth daily. 05/22/14   [provider]  gabapentin (NEURONTIN) 100 MG capsule Take 1 capsule (100 mg total) by mouth 2 (two) times daily. 12/15/19   Lavina Hamman, MD  insulin aspart (NOVOLOG) 100 UNIT/ML injection Inject 5 Units into the skin 3 (three) times daily with meals. 11/17/19   Fritzi Mandes, MD  Insulin Detemir (LEVEMIR) 100 UNIT/ML Pen Inject 15 Units into the skin daily. 12/15/19   Lavina Hamman, MD  Multiple Vitamin (MULTI-VITAMIN) tablet Take 1 tablet by mouth daily.    [provider]  nitroGLYCERIN (NITROSTAT) 0.4 MG SL tablet Place 0.4 mg under the tongue every 5 (five) minutes as needed for chest pain.     [provider]  polyethylene glycol (MIRALAX / GLYCOLAX) 17 g packet Take 17 g by mouth daily. 12/16/19   Lavina Hamman, MD  predniSONE (DELTASONE) 5 MG tablet TAKE 1 TABLET BY MOUTH DAILY WITH BREAKFAST. BEGIN TAKING WITH INITIATION OF ABIRATERONE. 12/06/19   Lequita Asal, MD  senna-docusate (SENOKOT-S) 8.6-50 MG tablet Take 1 tablet by mouth 2 (two) times daily. 12/15/19   Lavina Hamman, MD    Allergies Metformin and related  History reviewed. No pertinent  family history.  Social History Social History   Tobacco Use  . Smoking status: Former Research scientist (life sciences)  . Smokeless tobacco: Never Used  Substance Use Topics  . Alcohol use: Not on file  . Drug use: Not on file    Review of Systems Constitutional: No fever/chills Eyes: No visual changes. ENT: No sore throat. Cardiovascular: Denies chest pain. Respiratory: Denies shortness of breath. Gastrointestinal: No abdominal pain.  No nausea, no vomiting.   Musculoskeletal: Negative for headache, neck pain, and pain in any of his arms including the left arm which he reportedly bumped on the floor. Integumentary: Negative for rash.  Negative for laceration or abrasion. Neurological: Negative for headaches, focal weakness or  numbness.   ____________________________________________   PHYSICAL EXAM:  VITAL SIGNS: ED Triage Vitals  Enc Vitals Group     BP 01/02/20 0503 (!) 165/69     Pulse Rate 01/02/20 0503 63     Resp 01/02/20 0503 18     Temp 01/02/20 0503 98.2 F (36.8 C)     Temp Source 01/02/20 0503 Oral     SpO2 01/02/20 0503 100 %     Weight 01/02/20 0518 108.9 kg (240 lb)     Height 01/02/20 0518 1.803 m (5\' 11" )     Head Circumference --      Peak Flow --      Pain Score 01/02/20 0517 0     Pain Loc --      Pain Edu? --      Excl. in [place]? --     Constitutional: Alert and oriented.  Patient is in good spirits, talkative, making jokes. Eyes: Conjunctivae are normal.  Head: Atraumatic.  I evaluated the patient carefully and see no evidence of contusion, bruising, or abrasion at this time.  No tenderness to palpation throughout the scalp. Nose: No congestion/rhinnorhea. Mouth/Throat: Patient is wearing a mask. Neck: No stridor.  No meningeal signs.   Cardiovascular: Normal rate, regular rhythm. Good peripheral circulation. Grossly normal heart sounds. Respiratory: Normal respiratory effort.  No retractions. Gastrointestinal: Soft and nontender. No distention.  Musculoskeletal:  No lower extremity tenderness nor edema. No gross deformities of extremities.  He has no tenderness to palpation in particular of the left arm, no pain with wrist flexion extension or elbow flexion extension or range of motion of the left shoulder. Neurologic:  Normal speech and language. No gross focal neurologic deficits are appreciated.  Skin:  Skin is warm, dry and intact. Psychiatric: Mood and affect are normal. Speech and behavior are normal.  ____________________________________________   LABS (all labs ordered are listed, but only abnormal results are displayed)  Labs Reviewed - No data to display ____________________________________________  EKG  No indication for emergent EKG ____________________________________________  RADIOLOGY I, Hinda Kehr, personally viewed and evaluated these images (plain radiographs) as part of my medical decision making, as well as reviewing the written report by the radiologist.  ED MD interpretation: No indication for emergent imaging  Official radiology report(s): No results found.  ____________________________________________   PROCEDURES   Procedure(s) performed (including Critical Care):  Procedures   ____________________________________________   INITIAL IMPRESSION / MDM / [place] / ED COURSE  As part of my medical decision making, I reviewed the following data within the [place] notes reviewed and incorporated, Old chart reviewed and Notes from prior ED visits   Patient is alert and oriented and appears to have the capacity to make his own decisions.  He is denying any and all symptoms and is able to give me a good accounting of what happened.  His physical exam is reassuring and he has no pain.  There is no indication for emergent imaging or further intervention at this time.  He has no indication of having sustained an acute injury from his fall.  He is drinking orange juice without  difficulty in the ED.  I will discharge him back to his facility for outpatient follow-up as needed.          ____________________________________________  FINAL CLINICAL IMPRESSION(S) / ED DIAGNOSES  Final diagnoses:  Fall, initial encounter     MEDICATIONS GIVEN DURING THIS VISIT:  Medications - No data to  display   ED Discharge Orders    None      *Please note:  Brandon Smithey Sr. was evaluated in Emergency Department on 01/02/2020 for the symptoms described in the history of present illness. He was evaluated in the context of the global COVID-19 pandemic, which necessitated consideration that the patient might be at risk for infection with the SARS-CoV-2 virus that causes COVID-19. Institutional protocols and algorithms that pertain to the evaluation of patients at risk for COVID-19 are in a state of rapid change based on information released by regulatory bodies including the CDC and federal and state organizations. These policies and algorithms were followed during the patient's care in the ED.  Some ED evaluations and interventions may be delayed as a result of limited staffing during the pandemic.*  Note:  This document was prepared using Dragon voice recognition software and may include unintentional dictation errors.   Hinda Kehr, MD 01/02/20 [phone]

## 2020-01-02 NOTE — Discharge Instructions (Addendum)

## 2020-01-02 NOTE — ED Notes (Addendum)
Pt ambulated to toilet with stand by assist. Pt assisted back to bed and requests to sit on the side of the bed. Advised pt to use call bell if needed assistance and placed call bell in pt's reach

## 2020-01-02 NOTE — ED Triage Notes (Signed)
Pt arrives to ED from [facility] via [facility] with c/c of a fall. Pt states he was trying to get up to go help a neighbor and "rolled" out of bed, landing on his left arm and bumped the top of his head. Pt denies LoC, visual or auditory changes. EMS reports transport vitals of 182/72, p68 NSR, O2 99% on room air, CBG 452. Pt has Hx of type 2 diabetes and COPD. Upon arrival, pt A&Ox4, NAD.

## 2020-01-05 ENCOUNTER — Encounter: Payer: Self-pay | Admitting: Hematology and Oncology

## 2020-01-05 ENCOUNTER — Inpatient Hospital Stay: Payer: Medicare Other | Attending: Hematology and Oncology

## 2020-01-05 ENCOUNTER — Other Ambulatory Visit: Payer: Self-pay

## 2020-01-05 DIAGNOSIS — E785 Hyperlipidemia, unspecified: Secondary | ICD-10-CM | POA: Insufficient documentation

## 2020-01-05 DIAGNOSIS — Z86718 Personal history of other venous thrombosis and embolism: Secondary | ICD-10-CM | POA: Insufficient documentation

## 2020-01-05 DIAGNOSIS — I10 Essential (primary) hypertension: Secondary | ICD-10-CM | POA: Insufficient documentation

## 2020-01-05 DIAGNOSIS — E871 Hypo-osmolality and hyponatremia: Secondary | ICD-10-CM | POA: Insufficient documentation

## 2020-01-05 DIAGNOSIS — Z5111 Encounter for antineoplastic chemotherapy: Secondary | ICD-10-CM | POA: Insufficient documentation

## 2020-01-05 DIAGNOSIS — Z794 Long term (current) use of insulin: Secondary | ICD-10-CM | POA: Insufficient documentation

## 2020-01-05 DIAGNOSIS — G2581 Restless legs syndrome: Secondary | ICD-10-CM | POA: Insufficient documentation

## 2020-01-05 DIAGNOSIS — C7951 Secondary malignant neoplasm of bone: Secondary | ICD-10-CM | POA: Insufficient documentation

## 2020-01-05 DIAGNOSIS — Z7902 Long term (current) use of antithrombotics/antiplatelets: Secondary | ICD-10-CM | POA: Insufficient documentation

## 2020-01-05 DIAGNOSIS — Z7901 Long term (current) use of anticoagulants: Secondary | ICD-10-CM | POA: Insufficient documentation

## 2020-01-05 DIAGNOSIS — Z7952 Long term (current) use of systemic steroids: Secondary | ICD-10-CM | POA: Insufficient documentation

## 2020-01-05 DIAGNOSIS — I252 Old myocardial infarction: Secondary | ICD-10-CM | POA: Insufficient documentation

## 2020-01-05 DIAGNOSIS — C61 Malignant neoplasm of prostate: Secondary | ICD-10-CM | POA: Insufficient documentation

## 2020-01-05 DIAGNOSIS — G4730 Sleep apnea, unspecified: Secondary | ICD-10-CM | POA: Insufficient documentation

## 2020-01-05 DIAGNOSIS — N1831 Chronic kidney disease, stage 3a: Secondary | ICD-10-CM | POA: Insufficient documentation

## 2020-01-05 DIAGNOSIS — E119 Type 2 diabetes mellitus without complications: Secondary | ICD-10-CM | POA: Insufficient documentation

## 2020-01-05 DIAGNOSIS — Z79899 Other long term (current) drug therapy: Secondary | ICD-10-CM | POA: Insufficient documentation

## 2020-01-05 DIAGNOSIS — Z87891 Personal history of nicotine dependence: Secondary | ICD-10-CM | POA: Insufficient documentation

## 2020-01-05 DIAGNOSIS — C782 Secondary malignant neoplasm of pleura: Secondary | ICD-10-CM | POA: Insufficient documentation

## 2020-01-05 DIAGNOSIS — D472 Monoclonal gammopathy: Secondary | ICD-10-CM | POA: Insufficient documentation

## 2020-01-05 DIAGNOSIS — D649 Anemia, unspecified: Secondary | ICD-10-CM | POA: Insufficient documentation

## 2020-01-05 NOTE — Progress Notes (Signed)
[facility]  [address] [place], [place] [address] Phone: [phone]  Fax: [phone]   Clinic Day:  01/06/2020  Referring physician: [name], PA-C  Chief Complaint: Brandon Busing Sr. is a 86 y.o. male with a metastatic prostate cancer and stage III chronic kidney disease who is seen for 1 month assessment.  HPI:  The patient was last seen in the medical oncology clinic on 12/09/2019. At that time, he was doing fair.  He denied any bone pain.  Hematuria had resolved.  He had lost 22 pounds since discharge from the hospital.  He was eating better since discharge from rehab on 12/05/2019.  Sodium was 128.  Creatinine was 1.62.  He received degarelix.  Labs revealed a hematocrit 35.0, hemoglobin 11.0, MCV 87.7, platelets 249, WBC 7,700, and ANC 5,100. Ferritin was 549 with an iron saturation% of 15 and TIBC 190.  PSA was 216.00.   He presented to the [facility] ER on 12/08/2019 with hypoglycemia. He only ate a fruit cup for dinner, but then had his normal amount of insulin, and began to become lethargic. He was given juice and offered a meal, but refused. Glucose stabilized and he was discharged home.   He was admitted to the [facility] on 12/11/2019 -12/15/2019 s/p a fall in his home. He had two prior falls in the three days before admission. He denied any preceding chest pain, dizziness, lightheadedness, or palpitations. He admitted to decreased appetite, decreased intake for the last several days. Head CT was unremarkable. Echo revealed a preserved EF with no wall motion abnormality no valvular abnormality. A social worker was consulted for safe discharge planning. Gabapentin was added for increased peripheral neuropathy. He was discharged on 12/15/2019 to a skilled nursing facility.   He presented to the [facility] ER on 01/02/2020 s/p a fall at his skilled nursing facility while getting out of bed and into his wheelchair. He was discharged back to the SNF  following a check-up.   During the interim, he has been feeling better.  Appetite is better.  He has been drinking Ensure to help with his nutritional support, but he continues to have some nausea and vomiting. His blood sugar is high today, and they are helping manage this at the SNF [facility] assisted living).  He is currently participating in PT to build strength back at his SNF. At this time he is currently unable to bathe himself, walk without assistance, and use the bathroom without assistance.     Past Medical History:  Diagnosis Date  . Anemia, normocytic normochromic   . BPH (benign prostatic hyperplasia)   . CAD (coronary artery disease)   . Cataracts, bilateral   . Chronic renal insufficiency   . Colon polyp   . COPD (chronic obstructive pulmonary disease) ([place])   . Diabetes mellitus without complication ([place])   . DVT (deep venous thrombosis) ([place])   . Edema of both legs   . Glaucoma   . Gout   . Heart murmur   . Hepatitis C antibody test positive 10/20/2019  . Hyperlipidemia   . Hypertension   . Moderate persistent asthma without complication   . Myocardial infarction ([place])   . Neuromuscular disorder ([place])   . Neuropathy   . Peripheral vascular disease ([place])   . Premature ejaculation   . Prostate cancer ([place])   . RLS (restless legs syndrome)   . Sleep apnea   . Stented coronary artery 11/13/2015  . Uncontrolled type 2  diabetes mellitus with stage 3 chronic kidney disease, with long-term current use of insulin ([place])     Past Surgical History:  Procedure Laterality Date  . APPENDECTOMY    . [place]  2009  . COLONOSCOPY  2012  . EYE SURGERY    . IVC FILTER INSERTION N/A 11/07/2019   Procedure: IVC FILTER INSERTION;  Surgeon: Algernon Huxley, MD;  Location: [facility];  Service: Cardiovascular;  Laterality: N/A;  . PROSTATE SURGERY    . TONSILLECTOMY      History reviewed. No pertinent family history.  Social History:  reports that he  has quit smoking. He has never used smokeless tobacco. No history on file for alcohol and drug.  He previously smoked < 1 pack/day x 20 years (stopped smoking with cardiac stent placement).  He previously drank liquor on the weekend (stopped 2020).  He is retired Dance movement psychotherapist).  He previously lived outside of [place].  He then moved to [place] then [place] to live with his daughter [name] [phone]).  His wife died of cancer 1 year ago. He is currently living in [facility] assisted living.  The patient is accompanied by his daughter, [name], via video call today.   Allergies:  Allergies  Allergen Reactions  . Metformin And Related Diarrhea    Intolerance due to naturally loose bowels    Current Medications: Current Outpatient Medications  Medication Sig Dispense Refill  . abiraterone acetate (ZYTIGA) 250 MG tablet TAKE 4 TABLETS (1,000 MG TOTAL) BY MOUTH DAILY. TAKE ON AN EMPTY STOMACH 1 HOUR BEFORE OR 2 HOURS AFTER A MEAL 120 tablet 0  . atorvastatin (LIPITOR) 80 MG tablet Take 80 mg by mouth daily.    . carvedilol (COREG) 25 MG tablet Take 25 mg by mouth 2 (two) times daily with a meal.    . Cholecalciferol (VITAMIN D) 50 MCG (2000 UT) tablet Take 2,000 Units by mouth daily.     . DULoxetine (CYMBALTA) 60 MG capsule Take 60 mg by mouth daily.    . famotidine (PEPCID) 20 MG tablet Take 20 mg by mouth at bedtime as needed for heartburn.     . feeding supplement, ENSURE ENLIVE, (ENSURE ENLIVE) LIQD Take 237 mLs by mouth 4 (four) times daily -  with meals and at bedtime. 237 mL 12  . finasteride (PROSCAR) 5 MG tablet Take 5 mg by mouth daily.    Marland Kitchen gabapentin (NEURONTIN) 100 MG capsule Take 1 capsule (100 mg total) by mouth 2 (two) times daily. 60 capsule 0  . insulin aspart (NOVOLOG) 100 UNIT/ML injection Inject 5 Units into the skin 3 (three) times daily with meals. 10 mL 11  . Insulin Detemir (LEVEMIR) 100 UNIT/ML Pen Inject 15 Units into the skin daily.  20 mL 0  . Multiple Vitamin (MULTI-VITAMIN) tablet Take 1 tablet by mouth daily.    . polyethylene glycol (MIRALAX / GLYCOLAX) 17 g packet Take 17 g by mouth daily. 14 each 0  . predniSONE (DELTASONE) 5 MG tablet TAKE 1 TABLET BY MOUTH DAILY WITH BREAKFAST. BEGIN TAKING WITH INITIATION OF ABIRATERONE. 30 tablet 0  . senna-docusate (SENOKOT-S) 8.6-50 MG tablet Take 1 tablet by mouth 2 (two) times daily. 10 tablet 0  . nitroGLYCERIN (NITROSTAT) 0.4 MG SL tablet Place 0.4 mg under the tongue every 5 (five) minutes as needed for chest pain.      No current facility-administered medications for this visit.    Review of Systems  Constitutional:  Positive for malaise/fatigue. Negative for chills, diaphoresis, fever and weight loss (up 13 pounds since last visit).       Feels "sleepy".  HENT: Negative.  Negative for congestion, ear pain, hearing loss, nosebleeds, sinus pain and sore throat.   Eyes: Negative.  Negative for blurred vision and double vision.  Respiratory: Negative.  Negative for cough, hemoptysis, sputum production and shortness of breath.   Cardiovascular: Positive for leg swelling (chronic). Negative for chest pain, palpitations and orthopnea.  Gastrointestinal: Positive for nausea and vomiting (x 1). Negative for abdominal pain, blood in stool, constipation, diarrhea, heartburn and melena.  Genitourinary: Negative.  Negative for dysuria, frequency and urgency.       No hematuria.  Musculoskeletal: Positive for falls (prompting ER evaluation- see HPI). Negative for back pain, myalgias and neck pain.  Skin: Negative.  Negative for itching and rash.  Neurological: Positive for dizziness. Negative for tingling, sensory change, speech change, focal weakness, weakness and headaches.  Endo/Heme/Allergies: Does not bruise/bleed easily.       Diabetes- episode of hypoglycemia.  Psychiatric/Behavioral: Negative.  Negative for depression and memory loss. The patient is not nervous/anxious and  does not have insomnia.    Performance status (ECOG):  3  Vitals Blood pressure (!) 169/62, pulse 75, temperature (!) 95.9 F (35.5 C), temperature source Tympanic, resp. rate 18, height 5\' 11"  (1.803 m), weight 245 lb 11.2 oz (111.4 kg), SpO2 99 %.   Physical Exam  Constitutional: He is oriented to person, place, and time. No distress. Face mask in place.  Slightly fatigued appearing gentleman sitting comfortably in a wheelchair in no acute distress.  HENT:  Head: Normocephalic.  Mouth/Throat: Oropharynx is clear and moist. No oropharyngeal exudate.  Cap.  He has a cane at his side.  Eyes: Pupils are equal, round, and reactive to light. Conjunctivae and EOM are normal. No scleral icterus.  Brown eyes.  Neck: No JVD present.  Cardiovascular: Normal rate and normal heart sounds. Exam reveals no gallop.  No murmur heard. Pulmonary/Chest: Breath sounds normal. No respiratory distress. He has no wheezes. He has no rales.  Abdominal: Soft. Bowel sounds are normal. He exhibits no distension and no mass. There is no abdominal tenderness. There is no rebound and no guarding.  Musculoskeletal:        General: Tenderness and edema (2+ chronic- stable) present.     Cervical back: Normal range of motion and neck supple.  Lymphadenopathy:       Head (right side): No preauricular, no posterior auricular and no occipital adenopathy present.       Head (left side): No preauricular, no posterior auricular and no occipital adenopathy present.    He has no cervical adenopathy.    He has no axillary adenopathy.       Right: No inguinal and no supraclavicular adenopathy present.       Left: No inguinal and no supraclavicular adenopathy present.  Neurological: He is alert and oriented to person, place, and time.  Skin: Skin is warm and dry. No rash noted. He is not diaphoretic. No erythema. No pallor.  Psychiatric: He has a normal mood and affect. His behavior is normal. Judgment and thought content  normal.  Nursing note and vitals reviewed.   Appointment on 01/06/2020  Component Date Value Ref Range Status  . Sodium 01/06/2020 133* 135 - 145 mmol/L Final  . Potassium 01/06/2020 3.8  3.5 - 5.1 mmol/L Final  . Chloride 01/06/2020 100  98 - 111 mmol/L  Final  . CO2 01/06/2020 23  22 - 32 mmol/L Final  . Glucose, Bld 01/06/2020 260* 70 - 99 mg/dL Final   Glucose reference range applies only to samples taken after fasting for at least 8 hours.  . BUN 01/06/2020 9  8 - 23 mg/dL Final  . Creatinine, Ser 01/06/2020 0.95  0.61 - 1.24 mg/dL Final  . Calcium 01/06/2020 9.1  8.9 - 10.3 mg/dL Final  . GFR calc non Af Amer 01/06/2020 >60  >60 mL/min Final  . GFR calc Af Amer 01/06/2020 >60  >60 mL/min Final  . Anion gap 01/06/2020 10  5 - 15 Final   Performed at [facility], [address], [place], [place] [address]  . WBC 01/06/2020 8.0  4.0 - 10.5 K/uL Final  . RBC 01/06/2020 3.65* 4.22 - 5.81 MIL/uL Final  . Hemoglobin 01/06/2020 10.1* 13.0 - 17.0 g/dL Final  . HCT 01/06/2020 32.0* 39.0 - 52.0 % Final  . MCV 01/06/2020 87.7  80.0 - 100.0 fL Final  . MCH 01/06/2020 27.7  26.0 - 34.0 pg Final  . MCHC 01/06/2020 31.6  30.0 - 36.0 g/dL Final  . RDW 01/06/2020 15.8* 11.5 - 15.5 % Final  . Platelets 01/06/2020 244  150 - 400 K/uL Final  . nRBC 01/06/2020 0.0  0.0 - 0.2 % Final  . Neutrophils Relative % 01/06/2020 78  % Final  . Neutro Abs 01/06/2020 6.2  1.7 - 7.7 K/uL Final  . Lymphocytes Relative 01/06/2020 12  % Final  . Lymphs Abs 01/06/2020 1.0  0.7 - 4.0 K/uL Final  . Monocytes Relative 01/06/2020 7  % Final  . Monocytes Absolute 01/06/2020 0.6  0.1 - 1.0 K/uL Final  . Eosinophils Relative 01/06/2020 3  % Final  . Eosinophils Absolute 01/06/2020 0.3  0.0 - 0.5 K/uL Final  . Basophils Relative 01/06/2020 0  % Final  . Basophils Absolute 01/06/2020 0.0  0.0 - 0.1 K/uL Final  . Immature Granulocytes 01/06/2020 0  % Final  . Abs Immature Granulocytes 01/06/2020 0.03  0.00  - 0.07 K/uL Final   Performed at [facility], [address] [place], [place] [address]    Assessment:  Brandon Malkiewicz Sr. is a 86 y.o. male with metastatic prostate cancer.  He presented with gross hematuria. PSAwas 2241 on 11/06/2019.   Renal stone CTon 11/05/2019 revealed confluentnodular massesadjacent to the prostate and distal sigmoid colon with left pelvic, perirectal, retroperitoneal, and mesenteric adenopathy as well as probable peritoneal diseasemost marked in the right upper quadrant extending about the right diaphragmatic leaflet. There was pelvic and suspected vertebral osseous metastatic diseasewith L1 compression fracture, likely pathologic.There was a possible 2 cm right lower lobe lung nodule.There was left hydronephrosisand proximal ureterectasis secondary to pelvic process (prior renal ultrasound on 10/16/2019 revealed no hydronephrosis).  Chest CT on 11/08/2019 revealed right pleural effusion with pleural metastasis (2.7 x 2.1 cm nodularity along anterior right hemidiaphragm).  Osseous metastasis, including a dominant lesion involving the right-side of the L2 vertebral body.   Bone scan on 11/08/2019 revealed widespread bony metastasis (right occiput, clavicles, left upper extremity, sternum, ribs bilaterally, throughout the cervical, thoracic, lumbar spine, bony pelvis, proximal femurs, right tibia).   Patient declined biopsy for NGS.  He received degarelix on 11/11/2019 (last 12/09/2019).  He began abiraterone on 11/14/2019.  PSA has been followed:  32.82 on 12/22/2018, 33.08 on 12/30/2018, 122.41 on 05/27/2019, 2241 on 11/06/2019, 216 on 12/09/2019, and 80.45 01/06/2020.  He has  a monoclonal gammopathy.  SPEP on 10/16/2019 revealed a 0.4 gm/dL + an additional 0.2 gm/dL biclonal IgA protein with kappa specificity.  Bone marrow on 11/14/2019 revealed a hypercellular bone marrow for age with metastatic carcinoma c/w prostate cancer.  There was  slight plasmacytosis (4% plasma cells).  Plasma cells displayed polyclonal staining for kappa and lambda light chains with kappa light chain excess.  Findings were most suggestive of early involvement by a plasma cell neoplasm.  Cytogenetics were normal (75, XX).  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  He has a history of DVTin the upper deep calf vein in the region of the venous trifurcation on 07/04/2019.He was on Eliquis, but discontinued secondary to hematuria.  IVC filter was placed on 11/07/2019.  He has stage IIIa chronic kidney disease.  Creatinine is 1.4.  Symptomatically, he denies any bone pain.  He is in a skilled nursing facility secondary to recent falls.  Exam is stable.  Plan: 1.Labs today:  CBC with diff, CMP, PSA. 2.   Metastatic prostatic prostate cancer Symptomatically, he is doing well.  He has extensive pelvic, perirectal, retroperitoneal and mesenteric adenopathy. He has extensive osseous metastasis. Suspect pleural based nodularity is prostate cancer. No currrent intervention for  L1 pathologic compression as patient is asymptomatic. Patient receives degarelix monthly (due today).             Abiraterone and prednisone began on 11/14/2019.   Patient is tolerating well.   RN to call care facility to ensure patient has adequate supply.             PSA has decreased from 2241 on 11/06/2019 to 80.45 today.  Continue monthly degarelix and daily abiraterone + prednisone. 3.   Bone metastasis  Discuss plans for Xgeva.  Await dental clearance.  Patient's daughter to contact care facility for a dental exam. 4.Hematuria, resolved Patient previously admitted with gross hematuria.  No further hematuria.  Aspirin, Plavix, and Eliquis held. Patient s/p IVC filter placement on 11/07/2019.  Continue to monitor. 5.Normocytic anemia             Hematocrit 25.3.  Hemoglobin  7.8.  MCV 87.5 on 11/15/2019.  Hematocrit 35.0.  Hemoglobin 11.0.  MCV 87.7 on 12/08/2019.  Hematocrit 32.0.  Hemoglobin 10.1.  MCV 87.7 on 01/06/2020.  He is s/p 1 unit of PRBCs on 11/05/2019. Ferritin 549 (acute phase reactant) with iron saturation 15% and TIBC 190 on 12/09/2019.             B12 and folate were normal on 11/08/2019.  Continue to monitor. 6.   Monoclonal gammopathy of unknown significance (MGUS)             SPEP on 10/16/2019 revealed a 0.4 gm/dL + an additional 0.2 gm/dL biclonal IgA protein with kappa specificity.             Bone marrow aspirate and biopsy on 11/15/2019 revealed a hypercellular bone marrow with metastatic carcinoma c/w prostate cancer.     There was slight plasmacytosis (4% plasma cells).  Plasma cells were polyclonal.  Continue surveillance every 6 months. 7.    Left lower extremity DVT  Duplex on 07/04/2019 revealed upper deep calf vein in the region of the venous trifurcation.  Aspirin, Plavix, and Eliquis were held secondary to hematuria.  IVC filter was placed on 11/07/2019.   He has chronic lower extremity edema.  Continue to monitor. 8.   Weight loss, improving  Patient has gained weight.  Continue to  monitor. 9.   Hyponatremia  Sodium 131 and glucose 257 today.  FAX chemistries to PCP and call RN re: chemistries. 10.   Degarelix today. 11.   No Xegva- patient has not had dental clearance. 12.   RTC in 1 month for MD assessment, labs (CBC with diff, CMP, PSA), degarelix, and +/- Xgeva.  I discussed the assessment and treatment plan with the patient.  The patient was provided an opportunity to ask questions and all were answered.  The patient agreed with the plan and demonstrated an understanding of the instructions.  The patient was advised to call back if the symptoms worsen or if the condition fails to improve as anticipated.   Taylon Louison C. Mike Gip, MD, PhD    01/06/2020, 11:56 AM  I, Jacqualyn Posey, am acting as a Education administrator for  [employer]. Mike Gip, MD.   I, Robertt Buda C. Mike Gip, MD, have reviewed the above documentation for accuracy and completeness, and I agree with the above.

## 2020-01-05 NOTE — Progress Notes (Signed)
Nutrition Assessment:  Reason for Assessment: Referral from [name] regarding weight loss  86 year old male with metastatic prostate cancer followed by [name].  Past medical history of CKD stage III, DM, CHF, HTN, HLD, DVT, anemia. Noted hospitalization and ED visit.  Spoke with daughter, [name] this am via phone as indicated on RD schedule.  [name] reports that patient's appetite is better and he is in skilled nursing facility. She does not have any concerns about his nutrition at this time and declines nutrition assessment.  Please refer patient back to RD if services needed in the future.     [name], [name], [name] Registered Dietitian [phone] (pager)

## 2020-01-05 NOTE — Progress Notes (Signed)
No new changes noted today. The patient Name, DOB and medication has been verified by phone by his daughter.

## 2020-01-06 ENCOUNTER — Inpatient Hospital Stay: Payer: Medicare Other

## 2020-01-06 ENCOUNTER — Encounter: Payer: Self-pay | Admitting: Hematology and Oncology

## 2020-01-06 ENCOUNTER — Inpatient Hospital Stay (HOSPITAL_BASED_OUTPATIENT_CLINIC_OR_DEPARTMENT_OTHER): Payer: Medicare Other | Admitting: Hematology and Oncology

## 2020-01-06 VITALS — BP 169/62 | HR 75 | Temp 95.9°F | Resp 18 | Ht 71.0 in | Wt 245.7 lb

## 2020-01-06 DIAGNOSIS — I252 Old myocardial infarction: Secondary | ICD-10-CM | POA: Diagnosis not present

## 2020-01-06 DIAGNOSIS — C782 Secondary malignant neoplasm of pleura: Secondary | ICD-10-CM | POA: Diagnosis not present

## 2020-01-06 DIAGNOSIS — Z79899 Other long term (current) drug therapy: Secondary | ICD-10-CM | POA: Diagnosis not present

## 2020-01-06 DIAGNOSIS — C7951 Secondary malignant neoplasm of bone: Secondary | ICD-10-CM

## 2020-01-06 DIAGNOSIS — G4730 Sleep apnea, unspecified: Secondary | ICD-10-CM | POA: Diagnosis not present

## 2020-01-06 DIAGNOSIS — D472 Monoclonal gammopathy: Secondary | ICD-10-CM | POA: Diagnosis not present

## 2020-01-06 DIAGNOSIS — D649 Anemia, unspecified: Secondary | ICD-10-CM

## 2020-01-06 DIAGNOSIS — E119 Type 2 diabetes mellitus without complications: Secondary | ICD-10-CM | POA: Diagnosis not present

## 2020-01-06 DIAGNOSIS — Z86718 Personal history of other venous thrombosis and embolism: Secondary | ICD-10-CM | POA: Diagnosis not present

## 2020-01-06 DIAGNOSIS — Z7952 Long term (current) use of systemic steroids: Secondary | ICD-10-CM | POA: Diagnosis not present

## 2020-01-06 DIAGNOSIS — Z794 Long term (current) use of insulin: Secondary | ICD-10-CM | POA: Diagnosis not present

## 2020-01-06 DIAGNOSIS — N1831 Chronic kidney disease, stage 3a: Secondary | ICD-10-CM | POA: Diagnosis not present

## 2020-01-06 DIAGNOSIS — Z7901 Long term (current) use of anticoagulants: Secondary | ICD-10-CM | POA: Diagnosis not present

## 2020-01-06 DIAGNOSIS — G2581 Restless legs syndrome: Secondary | ICD-10-CM | POA: Diagnosis not present

## 2020-01-06 DIAGNOSIS — Z5111 Encounter for antineoplastic chemotherapy: Secondary | ICD-10-CM | POA: Diagnosis present

## 2020-01-06 DIAGNOSIS — C61 Malignant neoplasm of prostate: Secondary | ICD-10-CM

## 2020-01-06 DIAGNOSIS — I10 Essential (primary) hypertension: Secondary | ICD-10-CM | POA: Diagnosis not present

## 2020-01-06 DIAGNOSIS — Z7902 Long term (current) use of antithrombotics/antiplatelets: Secondary | ICD-10-CM | POA: Diagnosis not present

## 2020-01-06 DIAGNOSIS — Z87891 Personal history of nicotine dependence: Secondary | ICD-10-CM | POA: Diagnosis not present

## 2020-01-06 DIAGNOSIS — I824Y1 Acute embolism and thrombosis of unspecified deep veins of right proximal lower extremity: Secondary | ICD-10-CM

## 2020-01-06 DIAGNOSIS — Z7189 Other specified counseling: Secondary | ICD-10-CM

## 2020-01-06 DIAGNOSIS — E871 Hypo-osmolality and hyponatremia: Secondary | ICD-10-CM | POA: Diagnosis not present

## 2020-01-06 DIAGNOSIS — R634 Abnormal weight loss: Secondary | ICD-10-CM

## 2020-01-06 DIAGNOSIS — E785 Hyperlipidemia, unspecified: Secondary | ICD-10-CM | POA: Diagnosis not present

## 2020-01-06 LAB — CBC WITH DIFFERENTIAL/PLATELET
Abs Immature Granulocytes: 0.03 10*3/uL (ref 0.00–0.07)
Basophils Absolute: 0 10*3/uL (ref 0.0–0.1)
Basophils Relative: 0 %
Eosinophils Absolute: 0.3 10*3/uL (ref 0.0–0.5)
Eosinophils Relative: 3 %
HCT: 32 % — ABNORMAL LOW (ref 39.0–52.0)
Hemoglobin: 10.1 g/dL — ABNORMAL LOW (ref 13.0–17.0)
Immature Granulocytes: 0 %
Lymphocytes Relative: 12 %
Lymphs Abs: 1 10*3/uL (ref 0.7–4.0)
MCH: 27.7 pg (ref 26.0–34.0)
MCHC: 31.6 g/dL (ref 30.0–36.0)
MCV: 87.7 fL (ref 80.0–100.0)
Monocytes Absolute: 0.6 10*3/uL (ref 0.1–1.0)
Monocytes Relative: 7 %
Neutro Abs: 6.2 10*3/uL (ref 1.7–7.7)
Neutrophils Relative %: 78 %
Platelets: 244 10*3/uL (ref 150–400)
RBC: 3.65 MIL/uL — ABNORMAL LOW (ref 4.22–5.81)
RDW: 15.8 % — ABNORMAL HIGH (ref 11.5–15.5)
WBC: 8 10*3/uL (ref 4.0–10.5)
nRBC: 0 % (ref 0.0–0.2)

## 2020-01-06 LAB — COMPREHENSIVE METABOLIC PANEL
ALT: 19 U/L (ref 0–44)
AST: 19 U/L (ref 15–41)
Albumin: 3.3 g/dL — ABNORMAL LOW (ref 3.5–5.0)
Alkaline Phosphatase: 151 U/L — ABNORMAL HIGH (ref 38–126)
Anion gap: 11 (ref 5–15)
BUN: 9 mg/dL (ref 8–23)
CO2: 22 mmol/L (ref 22–32)
Calcium: 9 mg/dL (ref 8.9–10.3)
Chloride: 98 mmol/L (ref 98–111)
Creatinine, Ser: 0.92 mg/dL (ref 0.61–1.24)
GFR calc Af Amer: >60 mL/min (ref >60)
GFR calc non Af Amer: >60 mL/min (ref >60)
Glucose, Bld: 257 mg/dL — ABNORMAL HIGH (ref 70–99)
Potassium: 3.8 mmol/L (ref 3.5–5.1)
Sodium: 131 mmol/L — ABNORMAL LOW (ref 135–145)
Total Bilirubin: 0.6 mg/dL (ref 0.3–1.2)
Total Protein: 6.4 g/dL — ABNORMAL LOW (ref 6.5–8.1)

## 2020-01-06 LAB — BASIC METABOLIC PANEL
Anion gap: 10 (ref 5–15)
BUN: 9 mg/dL (ref 8–23)
CO2: 23 mmol/L (ref 22–32)
Calcium: 9.1 mg/dL (ref 8.9–10.3)
Chloride: 100 mmol/L (ref 98–111)
Creatinine, Ser: 0.95 mg/dL (ref 0.61–1.24)
GFR calc Af Amer: >60 mL/min (ref >60)
GFR calc non Af Amer: >60 mL/min (ref >60)
Glucose, Bld: 260 mg/dL — ABNORMAL HIGH (ref 70–99)
Potassium: 3.8 mmol/L (ref 3.5–5.1)
Sodium: 133 mmol/L — ABNORMAL LOW (ref 135–145)

## 2020-01-06 LAB — PSA: Prostatic Specific Antigen: 80.45 ng/mL — ABNORMAL HIGH (ref 0.00–4.00)

## 2020-01-06 MED ORDER — DEGARELIX ACETATE(240 MG DOSE) 120 MG/VIAL ~~LOC~~ SOLR
240.0000 mg | Freq: Once | SUBCUTANEOUS | Status: DC
  Filled 2020-01-06: qty 6

## 2020-01-06 MED ORDER — DEGARELIX ACETATE 80 MG ~~LOC~~ SOLR
80.0000 mg | Freq: Once | SUBCUTANEOUS | Status: AC
  Administered 2020-01-06: 80 mg via SUBCUTANEOUS

## 2020-02-02 ENCOUNTER — Inpatient Hospital Stay: Payer: Medicare Other

## 2020-02-02 ENCOUNTER — Inpatient Hospital Stay: Payer: Medicare Other | Admitting: Hematology and Oncology

## 2020-02-02 DIAGNOSIS — D649 Anemia, unspecified: Secondary | ICD-10-CM | POA: Insufficient documentation

## 2020-02-02 NOTE — Progress Notes (Signed)
[facility]  [address] [place], [place] [address] Phone: [phone]  Fax: [phone]   Clinic Day:  02/06/2020  Referring physician: [name], PA-C  Chief Complaint: Brandon Busing Sr. is a 86 y.o. male with a metastatic prostate cancer and stage III chronic kidney disease who is seen for a 1 month assessment.  HPI: The patient was last seen in the medical oncology clinic on 01/06/2020. At that time, he denied any bone pain. He was in a skilled nursing facility secondary to recent falls. Exam was stable. Hematocrit was 32.0, hemoglobin 10.1, platelets 244,000, WBC 8,000. Sodium was 131. Albumin was 3.3 and alkaline phosphatase 151. PSA was 80.45. We discussed Xgeva after dental clearance. He received degarelix.   During the interim, the patient was doing good. The patient still resides at [facility] assisted living. He has been doing OT and PT x 2 weeks with marked improvement. He is gaining his strength back slowly. He feels like some of the activities are dumb. He can get dressed, use the restroom and eat on his own. He has some assistance with bathing.   The swelling in his legs have improved. He denies any chest pain or shortness of breath. His nausea and vomiting have resolved. His weight is down 7 pounds and reports that he does not like the food. The facility has him on a diabetic diet. His daughter notes his appetite has increased. His daughter brings him food occasionally. He denies any dizziness. He has no hot flashes or night sweats. He reports no recent falls. He has no hematuria.   Patient received both COVID-19 vaccines in 10/2019. Patient has not received dental clearance for Xgeva.    Past Medical History:  Diagnosis Date  . Anemia, normocytic normochromic   . BPH (benign prostatic hyperplasia)   . CAD (coronary artery disease)   . Cataracts, bilateral   . Chronic renal insufficiency   . Colon polyp   . COPD (chronic  obstructive pulmonary disease) ([place])   . Diabetes mellitus without complication ([place])   . DVT (deep venous thrombosis) ([place])   . Edema of both legs   . Glaucoma   . Gout   . Heart murmur   . Hepatitis C antibody test positive 10/20/2019  . Hyperlipidemia   . Hypertension   . Moderate persistent asthma without complication   . Myocardial infarction ([place])   . Neuromuscular disorder ([place])   . Neuropathy   . Peripheral vascular disease ([place])   . Premature ejaculation   . Prostate cancer ([place])   . RLS (restless legs syndrome)   . Sleep apnea   . Stented coronary artery 11/13/2015  . Uncontrolled type 2 diabetes mellitus with stage 3 chronic kidney disease, with long-term current use of insulin ([place])     Past Surgical History:  Procedure Laterality Date  . APPENDECTOMY    . [place]  2009  . COLONOSCOPY  2012  . EYE SURGERY    . IVC FILTER INSERTION N/A 11/07/2019   Procedure: IVC FILTER INSERTION;  Surgeon: Algernon Huxley, MD;  Location: [facility];  Service: Cardiovascular;  Laterality: N/A;  . PROSTATE SURGERY    . TONSILLECTOMY      History reviewed. No pertinent family history.  Social History:  reports that he has quit smoking. He has never used smokeless tobacco. No history on file for alcohol and drug.   He previously smoked <1 pack/day x 20 years (stopped smoking  with cardiac stent placement). He previously drank liquor on the weekend (stopped 2020). He is retired Dance movement psychotherapist). He previously lived outside of [place]. He then moved to [place] then [place] to live with his daughter [name] [phone]). His wife died of cancer 1 year ago. He is currently living in [facility] assisted living. The patient is accompanied by [name] today.  Allergies:  Allergies  Allergen Reactions  . Metformin And Related Diarrhea    Intolerance due to naturally loose bowels    Current Medications: Current Outpatient  Medications  Medication Sig Dispense Refill  . abiraterone acetate (ZYTIGA) 250 MG tablet TAKE 4 TABLETS (1,000 MG TOTAL) BY MOUTH DAILY. TAKE ON AN EMPTY STOMACH 1 HOUR BEFORE OR 2 HOURS AFTER A MEAL 120 tablet 0  . atorvastatin (LIPITOR) 80 MG tablet Take 80 mg by mouth daily.    . carvedilol (COREG) 25 MG tablet Take 25 mg by mouth 2 (two) times daily with a meal.    . Cholecalciferol (VITAMIN D) 50 MCG (2000 UT) tablet Take 2,000 Units by mouth daily.     . DULoxetine (CYMBALTA) 60 MG capsule Take 60 mg by mouth daily.    . famotidine (PEPCID) 20 MG tablet Take 20 mg by mouth at bedtime as needed for heartburn.     . feeding supplement, ENSURE ENLIVE, (ENSURE ENLIVE) LIQD Take 237 mLs by mouth 4 (four) times daily -  with meals and at bedtime. 237 mL 12  . finasteride (PROSCAR) 5 MG tablet Take 5 mg by mouth daily.    Marland Kitchen gabapentin (NEURONTIN) 100 MG capsule Take 1 capsule (100 mg total) by mouth 2 (two) times daily. 60 capsule 0  . insulin aspart (NOVOLOG) 100 UNIT/ML injection Inject 5 Units into the skin 3 (three) times daily with meals. 10 mL 11  . Insulin Detemir (LEVEMIR) 100 UNIT/ML Pen Inject 15 Units into the skin daily. 20 mL 0  . Multiple Vitamin (MULTI-VITAMIN) tablet Take 1 tablet by mouth daily.    . polyethylene glycol (MIRALAX / GLYCOLAX) 17 g packet Take 17 g by mouth daily. 14 each 0  . predniSONE (DELTASONE) 5 MG tablet TAKE 1 TABLET BY MOUTH DAILY WITH BREAKFAST. BEGIN TAKING WITH INITIATION OF ABIRATERONE. 30 tablet 0  . senna-docusate (SENOKOT-S) 8.6-50 MG tablet Take 1 tablet by mouth 2 (two) times daily. 10 tablet 0  . nitroGLYCERIN (NITROSTAT) 0.4 MG SL tablet Place 0.4 mg under the tongue every 5 (five) minutes as needed for chest pain.      No current facility-administered medications for this visit.    Review of Systems  Constitutional: Positive for weight loss (7 pounds). Negative for chills, diaphoresis, fever and malaise/fatigue.       Doing good. Performing  ADLs. Slowly gaining strength. Patient in wheelchair.  HENT: Negative.  Negative for congestion, ear pain, hearing loss, nosebleeds, sinus pain and sore throat.   Eyes: Negative.  Negative for blurred vision and double vision.  Respiratory: Negative.  Negative for cough, hemoptysis, sputum production and shortness of breath.   Cardiovascular: Negative for chest pain, palpitations, orthopnea and leg swelling (chronic; improved).  Gastrointestinal: Negative for abdominal pain, blood in stool, constipation, diarrhea, heartburn, melena, nausea and vomiting.  Genitourinary: Negative.  Negative for dysuria, frequency and urgency.       No hematuria.  Musculoskeletal: Negative for back pain, falls (no recent), myalgias and neck pain.  Skin: Negative.  Negative for itching and rash.  Neurological: Negative for dizziness,  tingling, sensory change, speech change, focal weakness, weakness and headaches.  Endo/Heme/Allergies: Does not bruise/bleed easily.       Diabetes- episode of hypoglycemia.  Psychiatric/Behavioral: Negative.  Negative for depression and memory loss. The patient is not nervous/anxious and does not have insomnia.   All other systems reviewed and are negative.  Performance status (ECOG): 2  Vitals Blood pressure (!) 134/58, pulse 73, temperature 97.9 F (36.6 C), temperature source Tympanic, resp. rate 18, height 5\' 11"  (1.803 m), weight 238 lb (108 kg), SpO2 100 %.   Physical Exam  Constitutional: He is oriented to person, place, and time. He appears well-developed and well-nourished. No distress. Face mask in place.  Patient examined in wheelchair.  HENT:  Head: Normocephalic.  Mouth/Throat: Oropharynx is clear and moist. No oropharyngeal exudate.  Cap.  Eyes: Pupils are equal, round, and reactive to light. Conjunctivae and EOM are normal. No scleral icterus.  Brown eyes.  Neck: No JVD present.  Cardiovascular: Normal rate, regular rhythm and normal heart sounds. Exam reveals  no gallop.  No murmur heard. Pulmonary/Chest: Effort normal and breath sounds normal. No respiratory distress. He has no wheezes. He has no rales.  Abdominal: Soft. Bowel sounds are normal. He exhibits no distension and no mass. There is no abdominal tenderness. There is no rebound and no guarding.  Musculoskeletal:        General: Edema (minimal) present. No tenderness. Normal range of motion.     Cervical back: Normal range of motion and neck supple.  Lymphadenopathy:       Head (right side): No preauricular, no posterior auricular and no occipital adenopathy present.       Head (left side): No preauricular, no posterior auricular and no occipital adenopathy present.    He has no cervical adenopathy.    He has no axillary adenopathy.       Right: No supraclavicular adenopathy present.       Left: No supraclavicular adenopathy present.  Neurological: He is alert and oriented to person, place, and time.  Skin: Skin is warm and dry. No rash noted. He is not diaphoretic. No erythema. No pallor.  Psychiatric: He has a normal mood and affect. His behavior is normal. Judgment and thought content normal.  Nursing note and vitals reviewed.   Appointment on 02/06/2020  Component Date Value Ref Range Status  . Sodium 02/06/2020 135  135 - 145 mmol/L Final  . Potassium 02/06/2020 3.7  3.5 - 5.1 mmol/L Final  . Chloride 02/06/2020 101  98 - 111 mmol/L Final  . CO2 02/06/2020 26  22 - 32 mmol/L Final  . Glucose, Bld 02/06/2020 211* 70 - 99 mg/dL Final   Glucose reference range applies only to samples taken after fasting for at least 8 hours.  . BUN 02/06/2020 17  8 - 23 mg/dL Final  . Creatinine, Ser 02/06/2020 1.16  0.61 - 1.24 mg/dL Final  . Calcium 02/06/2020 9.3  8.9 - 10.3 mg/dL Final  . Total Protein 02/06/2020 6.6  6.5 - 8.1 g/dL Final  . Albumin 02/06/2020 3.4* 3.5 - 5.0 g/dL Final  . AST 02/06/2020 18  15 - 41 U/L Final  . ALT 02/06/2020 23  0 - 44 U/L Final  . Alkaline Phosphatase  02/06/2020 91  38 - 126 U/L Final  . Total Bilirubin 02/06/2020 0.6  0.3 - 1.2 mg/dL Final  . GFR calc non Af Amer 02/06/2020 57* >60 mL/min Final  . GFR calc Af Amer 02/06/2020 >60  >  60 mL/min Final  . Anion gap 02/06/2020 8  5 - 15 Final   Performed at [facility], [address] [place], [place] [address]  . WBC 02/06/2020 6.6  4.0 - 10.5 K/uL Final  . RBC 02/06/2020 3.91* 4.22 - 5.81 MIL/uL Final  . Hemoglobin 02/06/2020 10.9* 13.0 - 17.0 g/dL Final  . HCT 02/06/2020 34.2* 39.0 - 52.0 % Final  . MCV 02/06/2020 87.5  80.0 - 100.0 fL Final  . MCH 02/06/2020 27.9  26.0 - 34.0 pg Final  . MCHC 02/06/2020 31.9  30.0 - 36.0 g/dL Final  . RDW 02/06/2020 14.8  11.5 - 15.5 % Final  . Platelets 02/06/2020 202  150 - 400 K/uL Final  . nRBC 02/06/2020 0.0  0.0 - 0.2 % Final  . Neutrophils Relative % 02/06/2020 65  % Final  . Neutro Abs 02/06/2020 4.3  1.7 - 7.7 K/uL Final  . Lymphocytes Relative 02/06/2020 26  % Final  . Lymphs Abs 02/06/2020 1.7  0.7 - 4.0 K/uL Final  . Monocytes Relative 02/06/2020 6  % Final  . Monocytes Absolute 02/06/2020 0.4  0.1 - 1.0 K/uL Final  . Eosinophils Relative 02/06/2020 2  % Final  . Eosinophils Absolute 02/06/2020 0.2  0.0 - 0.5 K/uL Final  . Basophils Relative 02/06/2020 1  % Final  . Basophils Absolute 02/06/2020 0.0  0.0 - 0.1 K/uL Final  . Immature Granulocytes 02/06/2020 0  % Final  . Abs Immature Granulocytes 02/06/2020 0.02  0.00 - 0.07 K/uL Final   Performed at [facility], [address], [place], [place] [address]    Assessment:  Brandon Zumbro Sr. is a 86 y.o. male with metastatic prostate cancer.  He presented with gross hematuria. PSAwas 2241 on 11/06/2019.   Renal stone CTon 11/05/2019 revealed confluentnodular massesadjacent to the prostate and distal sigmoid colon with left pelvic, perirectal, retroperitoneal, and mesenteric adenopathy as well as probable peritoneal diseasemost marked in the right upper  quadrant extending about the right diaphragmatic leaflet. There was pelvic and suspected vertebral osseous metastatic diseasewith L1 compression fracture, likely pathologic.There was a possible 2 cm right lower lobe lung nodule.There was left hydronephrosisand proximal ureterectasis secondary to pelvic process (prior renal ultrasound on 10/16/2019 revealed no hydronephrosis).  Chest CTon 11/08/2019 revealed right pleural effusion with pleural metastasis(2.7 x 2.1 cm nodularity along anterior right hemidiaphragm). Osseous metastasis, including a dominant lesioninvolving the right-side of the L2vertebral body.   Bone scanon 11/08/2019 revealed widespread bony metastasis(right occiput, clavicles, left upper extremity, sternum, ribs bilaterally, throughout the cervical, thoracic, lumbar spine, bony pelvis, proximal femurs, right tibia).  Patient declined biopsy for NGS.  He received degarelixon 11/11/2019 (last 12/09/2019). He began abirateroneon 11/14/2019.  PSA has been followed:  32.82 on 12/22/2018, 33.08 on 12/30/2018, 122.41 on 05/27/2019, 2241 on 11/06/2019, 216 on 12/09/2019, 80.45 on 01/06/2020, and 54.06 on 02/06/2020.  He has a monoclonal gammopathy. SPEPon 10/16/2019 revealed a 0.4 gm/dL + an additional 0.2 gm/dL biclonal IgAprotein with kappa specificity. Bone marrowon 11/14/2019 revealed a hypercellular bone marrow for age with metastatic carcinoma c/w prostate cancer.  There was slight plasmacytosis (4% plasma cells).  Plasma cells displayed polyclonal staining for kappa and lambda light chains with kappa light chain excess.  Findings were most suggestive of early involvement by a plasma cell neoplasm.  Cytogenetics were normal (22, XX).  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  SPEP has been followed (gm/dL): 0.3 on 10/16/2019.  He has a history of  DVTin the upper deep calf vein in the region of the venous trifurcation on 07/04/2019.He was on Eliquis, but  discontinued secondary to hematuria.  IVC filter was placed on 11/07/2019.  He is hepatitis C positive.  Testing occurred on 10/17/2019.  He has stage IIIa chronic kidney disease.  Creatinine is 1.4.  Patient received both COVID-19 vaccines in 10/2019.  Symptomatically, weight is down 7 pounds as he does not like the food at the care facility.  He denies pain.  Exam is stable.  Plan: 1.   Labs today: CBC with diff, CMP, PSA.  2.   Metastatic prostatic prostate cancer Symptomatically, he continues to do well             He has extensive pelvic, perirectal, retroperitoneal and mesenteric adenopathy. He has extensive osseous metastasis. Suspect pleural based nodularity is prostate cancer. No currrent intervention for L1 pathologic compression as patient is asymptomatic. Patient receives degarelix monthly (last 01/06/2020).   Degarelix today. Abiraterone and prednisone began on 11/14/2019.                         Continue abiraterone and prednisone dispensed by care facility. PSA has decreased from 2241 on 11/06/2019 to 54.6 today.             To new monthly degarelix and abiraterone plus prednisone. 3.   Bone metastasis             Review plans for monthly Xgeva.             Dental clearance has not been obtained. 4.Normocytic anemia Hematocrit 25.3. Hemoglobin   7.8. MCV 87.5 on 11/15/2019.             Hematocrit 35.0. Hemoglobin 11.0. MCV 87.7 on 12/08/2019.             Hematocrit 32.0. Hemoglobin 10.1. MCV 87.7 on 01/06/2020.             Hematocrit 34.2.  Hemoglobin 10.9.  MCV 87.5 on 02/06/2020   He is s/p 1 unit of PRBCs on 11/05/2019. Ferritin 549 (acute phase reactant) with iron saturation 15% and TIBC 190 on 12/09/2019. B12 and folate were normal on 11/08/2019.             Continue to monitor. 5. Monoclonal gammopathy of unknown significance  (MGUS) SPEP on 10/16/2019 revealed a 0.4 gm/dL + an additional 0.2 gm/dL biclonal IgA protein with kappa specificity. Bone marrow aspirate and biopsy on 11/15/2019 revealed a hypercellular bone marrow with metastatic carcinoma c/w prostate cancer.                           There was slight plasmacytosis (4% plasma cells).  Plasma cells were polyclonal.             Check SPEP every 6 months. 6.    Left lower extremity DVT             Duplex on 07/04/2019 revealed upper deep calf vein in the region of the venous trifurcation.             Aspirin, Plavix, and Eliquis were held secondary to hematuria.             IVC filter was placed on 11/07/2019.              He remains off anticoagulation.  Continue to monitor. 7.   Weight loss  Weight is down 7 pounds.  He states that he does not like the food in the care facility.  Family brings in food.  Discuss supplemental shakes 8.   Degarelix today. 9.   No Xegva- patient has not had dental clearance. 10.   RTC in 1 month for MD assessment, labs (CBC with diff, CMP, PSA), degarelix, and +/- Xgeva.  I discussed the assessment and treatment plan with the patient.  The patient was provided an opportunity to ask questions and all were answered.  The patient agreed with the plan and demonstrated an understanding of the instructions.  The patient was advised to call back if the symptoms worsen or if the condition fails to improve as anticipated.   Lequita Asal, MD, PhD    02/06/2020, 9:25 AM  I, Selena Batten, am acting as scribe for [employer]. Mike Gip, MD, PhD.  I, Ayce Pietrzyk C. Mike Gip, MD, have reviewed the above documentation for accuracy and completeness, and I agree with the above.

## 2020-02-03 ENCOUNTER — Other Ambulatory Visit: Payer: Self-pay

## 2020-02-03 ENCOUNTER — Encounter: Payer: Self-pay | Admitting: Hematology and Oncology

## 2020-02-03 MED FILL — ABIRATERONE ACETATE 250 MG: 250 | 30 days supply | Qty: 120 | Fill #0

## 2020-02-03 NOTE — Progress Notes (Signed)
No new changes reported by the patient daughter. The patient Name, DOB and medication has been verified by phone today. The patient daughter reports that her father has not gotten a dental clearance.

## 2020-02-06 ENCOUNTER — Inpatient Hospital Stay: Payer: Medicare Other | Attending: Hematology and Oncology

## 2020-02-06 ENCOUNTER — Encounter: Payer: Self-pay | Admitting: Hematology and Oncology

## 2020-02-06 ENCOUNTER — Inpatient Hospital Stay (HOSPITAL_BASED_OUTPATIENT_CLINIC_OR_DEPARTMENT_OTHER): Payer: Medicare Other | Admitting: Hematology and Oncology

## 2020-02-06 ENCOUNTER — Inpatient Hospital Stay: Payer: Medicare Other

## 2020-02-06 ENCOUNTER — Other Ambulatory Visit: Payer: Self-pay

## 2020-02-06 VITALS — BP 134/58 | HR 73 | Temp 97.9°F | Resp 18 | Ht 71.0 in | Wt 238.0 lb

## 2020-02-06 DIAGNOSIS — C7951 Secondary malignant neoplasm of bone: Secondary | ICD-10-CM | POA: Diagnosis present

## 2020-02-06 DIAGNOSIS — C61 Malignant neoplasm of prostate: Secondary | ICD-10-CM | POA: Insufficient documentation

## 2020-02-06 DIAGNOSIS — D649 Anemia, unspecified: Secondary | ICD-10-CM | POA: Insufficient documentation

## 2020-02-06 DIAGNOSIS — Z5111 Encounter for antineoplastic chemotherapy: Secondary | ICD-10-CM | POA: Diagnosis not present

## 2020-02-06 DIAGNOSIS — E871 Hypo-osmolality and hyponatremia: Secondary | ICD-10-CM

## 2020-02-06 DIAGNOSIS — I824Y2 Acute embolism and thrombosis of unspecified deep veins of left proximal lower extremity: Secondary | ICD-10-CM

## 2020-02-06 DIAGNOSIS — D472 Monoclonal gammopathy: Secondary | ICD-10-CM | POA: Diagnosis not present

## 2020-02-06 DIAGNOSIS — R634 Abnormal weight loss: Secondary | ICD-10-CM

## 2020-02-06 LAB — COMPREHENSIVE METABOLIC PANEL
ALT: 23 U/L (ref 0–44)
AST: 18 U/L (ref 15–41)
Albumin: 3.4 g/dL — ABNORMAL LOW (ref 3.5–5.0)
Alkaline Phosphatase: 91 U/L (ref 38–126)
Anion gap: 8 (ref 5–15)
BUN: 17 mg/dL (ref 8–23)
CO2: 26 mmol/L (ref 22–32)
Calcium: 9.3 mg/dL (ref 8.9–10.3)
Chloride: 101 mmol/L (ref 98–111)
Creatinine, Ser: 1.16 mg/dL (ref 0.61–1.24)
GFR calc Af Amer: >60 mL/min (ref >60)
GFR calc non Af Amer: 57 mL/min — ABNORMAL LOW (ref >60)
Glucose, Bld: 211 mg/dL — ABNORMAL HIGH (ref 70–99)
Potassium: 3.7 mmol/L (ref 3.5–5.1)
Sodium: 135 mmol/L (ref 135–145)
Total Bilirubin: 0.6 mg/dL (ref 0.3–1.2)
Total Protein: 6.6 g/dL (ref 6.5–8.1)

## 2020-02-06 LAB — CBC WITH DIFFERENTIAL/PLATELET
Abs Immature Granulocytes: 0.02 10*3/uL (ref 0.00–0.07)
Basophils Absolute: 0 10*3/uL (ref 0.0–0.1)
Basophils Relative: 1 %
Eosinophils Absolute: 0.2 10*3/uL (ref 0.0–0.5)
Eosinophils Relative: 2 %
HCT: 34.2 % — ABNORMAL LOW (ref 39.0–52.0)
Hemoglobin: 10.9 g/dL — ABNORMAL LOW (ref 13.0–17.0)
Immature Granulocytes: 0 %
Lymphocytes Relative: 26 %
Lymphs Abs: 1.7 10*3/uL (ref 0.7–4.0)
MCH: 27.9 pg (ref 26.0–34.0)
MCHC: 31.9 g/dL (ref 30.0–36.0)
MCV: 87.5 fL (ref 80.0–100.0)
Monocytes Absolute: 0.4 10*3/uL (ref 0.1–1.0)
Monocytes Relative: 6 %
Neutro Abs: 4.3 10*3/uL (ref 1.7–7.7)
Neutrophils Relative %: 65 %
Platelets: 202 10*3/uL (ref 150–400)
RBC: 3.91 MIL/uL — ABNORMAL LOW (ref 4.22–5.81)
RDW: 14.8 % (ref 11.5–15.5)
WBC: 6.6 10*3/uL (ref 4.0–10.5)
nRBC: 0 % (ref 0.0–0.2)

## 2020-02-06 LAB — PSA: Prostatic Specific Antigen: 54.06 ng/mL — ABNORMAL HIGH (ref 0.00–4.00)

## 2020-02-06 MED ORDER — DEGARELIX ACETATE 80 MG ~~LOC~~ SOLR
80.0000 mg | Freq: Once | SUBCUTANEOUS | Status: AC
  Administered 2020-02-06: 80 mg via SUBCUTANEOUS

## 2020-02-27 IMAGING — CR DG CHEST 2V
2 series · 2 of 2 positions shown · non-contrast
Comparison: 12/03/2018

CLINICAL DATA: c/o altered LOC per family. Possible UTI symptoms
per EMS. D10 given by EMS. CBG 50 upon arrival Hx of COPD, CAD,
hypertension

EXAM:
CHEST - 2 VIEW

[chest pa]
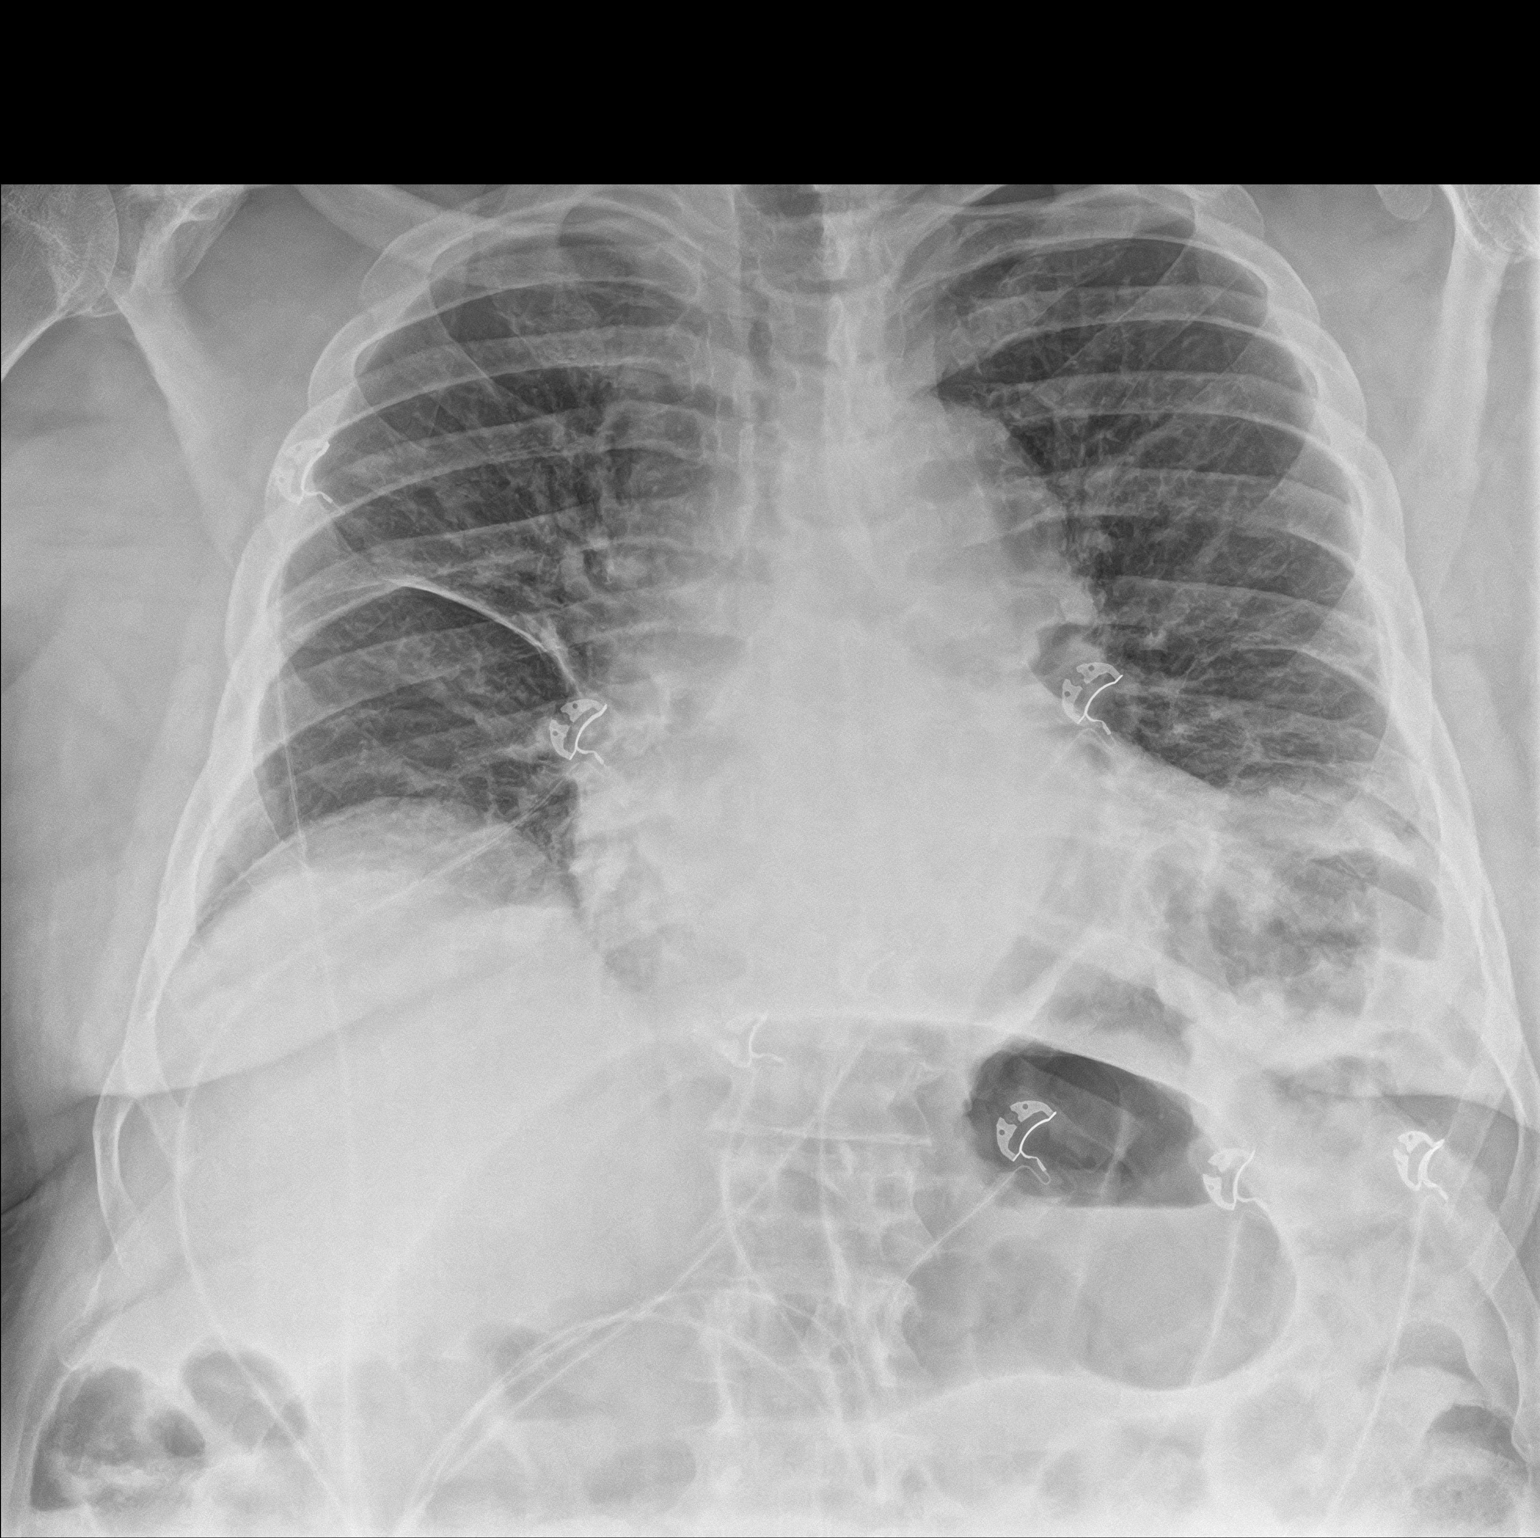

[chest lat]
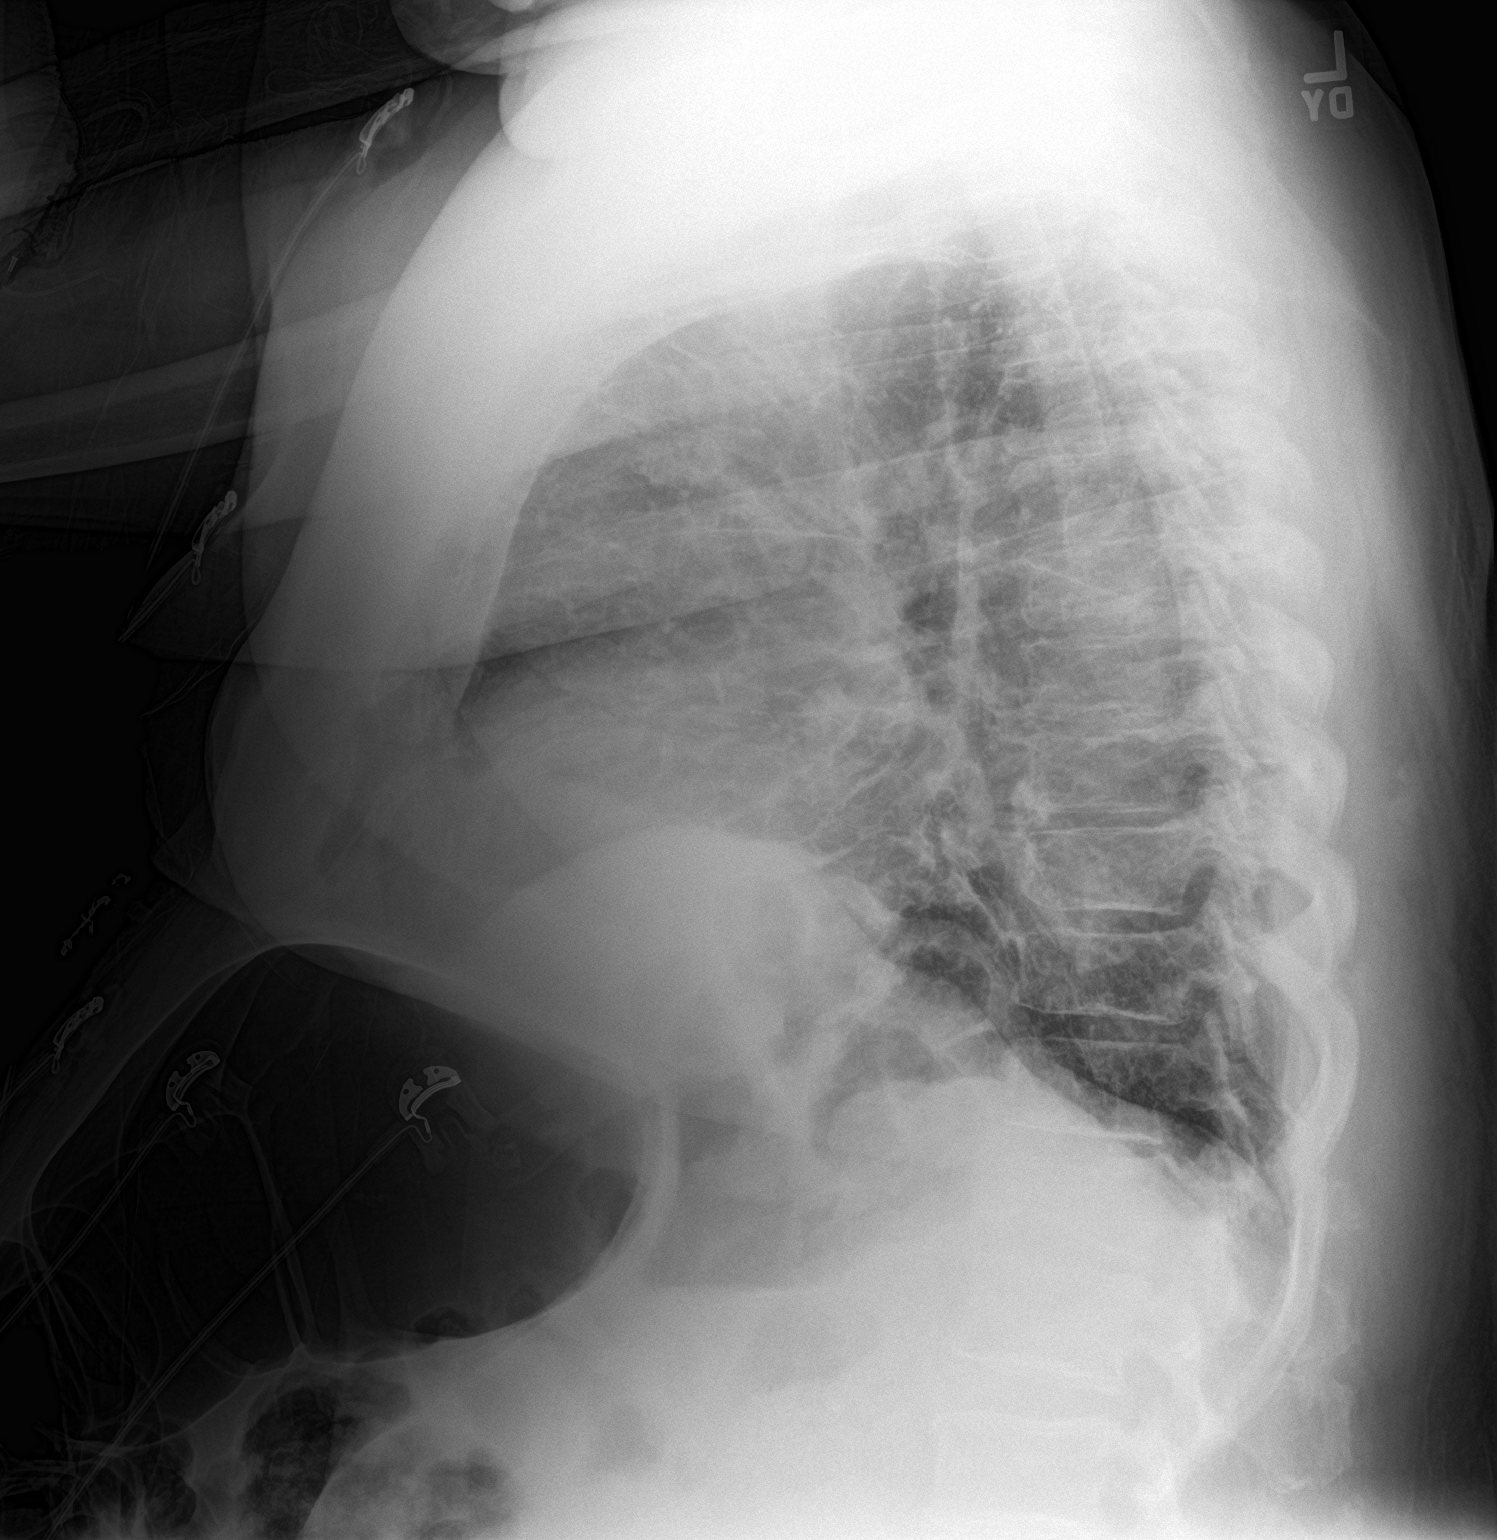

[2 of 2 positions shown; findings below may reference images not displayed]

FINDINGS: Heart is enlarged. Shallow lung inflation. There is prominence of
interstitial markings, new since the prior study. Focal opacity at
the LEFT lung base is consistent atelectasis or early infiltrate.
There is gaseous distension of the stomach.
IMPRESSION: 1. Shallow inflation.
2. Cardiomegaly and interstitial edema.
3. Atelectasis or early infiltrate at the LEFT lung base.

## 2020-03-02 NOTE — Progress Notes (Signed)
[facility]  [address] [place], [place] [address] Phone: [phone]  Fax: [phone]   Clinic Day:  03/07/2020  Referring physician: [name], PA-C  Chief Complaint: [name] is a 86 y.o. male with  a metastatic prostate cancer and stage III chronic kidney disease who is seen for a 1 month assessment.  HPI: The patient was last seen in the medical oncology clinic on 02/06/2020. At that time, he was doing well. He had been doing OT and PT at [facility] assisted living with marked improvement. The swelling in his legs had improved.  He denied any chest pain or shortness of breath. Hematocrit was 34.2, hemoglobin 10.9, MCV 87.5, platelets 202,000, WBC 6600, ANC 4300. PSA was 54.06. He continued abiraterone. He received degarelix.    At last visit, he remained off apirin, Plavix, and Eliquis secondary to prior hematuria.  IVC filter remained in place for a left lower extremity DVT.  Dental clearance was pending prior to initiation of Xgeva.  During the interim, he is doing 'alright'. He is out of his wheelchair and is able to walk. His daughter says he has gotten some of his strength back. He is still at [facility] assisted living. His daughter says it is short-term because they are able to monitor his medications more than she can. He has assistance when he showers and gets dressed. He says his stomach was sore for almost three weeks after his last shot. He admitted to hematuria three weeks ago and again this morning. Besides the hematuria, he denies any other symptoms head to toe. He is wearing support stockings on his legs.   He has received both doses of the COVID-19 vaccine on 11/03/2019.    Past Medical History:  Diagnosis Date  . Anemia, normocytic normochromic   . BPH (benign prostatic hyperplasia)   . CAD (coronary artery disease)   . Cataracts, bilateral   . Chronic renal insufficiency   . Colon polyp   . COPD  (chronic obstructive pulmonary disease) ([place])   . Diabetes mellitus without complication ([place])   . DVT (deep venous thrombosis) ([place])   . Edema of both legs   . Glaucoma   . Gout   . Heart murmur   . Hepatitis C antibody test positive 10/20/2019  . Hyperlipidemia   . Hypertension   . Moderate persistent asthma without complication   . Myocardial infarction ([place])   . Neuromuscular disorder ([place])   . Neuropathy   . Peripheral vascular disease ([place])   . Premature ejaculation   . Prostate cancer ([place])   . RLS (restless legs syndrome)   . Sleep apnea   . Stented coronary artery 11/13/2015  . Uncontrolled type 2 diabetes mellitus with stage 3 chronic kidney disease, with long-term current use of insulin ([place])     Past Surgical History:  Procedure Laterality Date  . APPENDECTOMY    . [place]  2009  . COLONOSCOPY  2012  . EYE SURGERY    . IVC FILTER INSERTION N/A 11/07/2019   Procedure: IVC FILTER INSERTION;  Surgeon: Algernon Huxley, MD;  Location: [place] [facility];  Service: Cardiovascular;  Laterality: N/A;  . PROSTATE SURGERY    . TONSILLECTOMY      No family history on file.  Social History:  reports that he has quit smoking. He has never used smokeless tobacco. No history on file for alcohol and drug. He previously smoked <1 pack/day x 20  years (stopped smoking with cardiac stent placement). He previously drank liquor on the weekend (stopped 2020). He is retired Dance movement psychotherapist). He previously lived outside of [place]. He then moved to [place] then [place] to live with his daughter Dade Rodin [phone]). His wife died of cancer 1 year ago.He is currently living at [facility] assisted living. The patient is accompanied by his daughter on the iPad today.  Allergies:  Allergies  Allergen Reactions  . Metformin And Related Diarrhea    Intolerance due to naturally loose bowels    Current Medications: Current Outpatient  Medications  Medication Sig Dispense Refill  . abiraterone acetate (ZYTIGA) 250 MG tablet TAKE 4 TABLETS (1,000 MG TOTAL) BY MOUTH DAILY. TAKE ON AN EMPTY STOMACH 1 HOUR BEFORE OR 2 HOURS AFTER A MEAL 120 tablet 0  . atorvastatin (LIPITOR) 80 MG tablet Take 80 mg by mouth daily.    . carvedilol (COREG) 25 MG tablet Take 25 mg by mouth 2 (two) times daily with a meal.    . Cholecalciferol (VITAMIN D) 50 MCG (2000 UT) tablet Take 2,000 Units by mouth daily.     . DULoxetine (CYMBALTA) 60 MG capsule Take 60 mg by mouth daily.    . famotidine (PEPCID) 20 MG tablet Take 20 mg by mouth at bedtime as needed for heartburn.     . feeding supplement, ENSURE ENLIVE, (ENSURE ENLIVE) LIQD Take 237 mLs by mouth 4 (four) times daily -  with meals and at bedtime. 237 mL 12  . finasteride (PROSCAR) 5 MG tablet Take 5 mg by mouth daily.    Marland Kitchen gabapentin (NEURONTIN) 100 MG capsule Take 1 capsule (100 mg total) by mouth 2 (two) times daily. 60 capsule 0  . insulin aspart (NOVOLOG) 100 UNIT/ML injection Inject 5 Units into the skin 3 (three) times daily with meals. 10 mL 11  . Insulin Detemir (LEVEMIR) 100 UNIT/ML Pen Inject 15 Units into the skin daily. 20 mL 0  . Multiple Vitamin (MULTI-VITAMIN) tablet Take 1 tablet by mouth daily.    . nitroGLYCERIN (NITROSTAT) 0.4 MG SL tablet Place 0.4 mg under the tongue every 5 (five) minutes as needed for chest pain.     . polyethylene glycol (MIRALAX / GLYCOLAX) 17 g packet Take 17 g by mouth daily. 14 each 0  . predniSONE (DELTASONE) 5 MG tablet TAKE 1 TABLET BY MOUTH DAILY WITH BREAKFAST. BEGIN TAKING WITH INITIATION OF ABIRATERONE. 30 tablet 0  . senna-docusate (SENOKOT-S) 8.6-50 MG tablet Take 1 tablet by mouth 2 (two) times daily. 10 tablet 0   No current facility-administered medications for this visit.    Review of Systems  Constitutional: Negative for chills, diaphoresis, fever, malaise/fatigue and weight loss (up 5 lb).       Doing 'alright'. No longer in  wheelchair; gaining more strength day by day.   HENT: Negative for congestion, ear discharge, ear pain, hearing loss, sore throat and tinnitus.   Eyes: Negative for blurred vision and double vision.  Respiratory: Negative for cough and shortness of breath.   Cardiovascular: Negative for chest pain, palpitations and leg swelling (chronic; improved). .  Gastrointestinal: Negative for abdominal pain, blood in stool, constipation, diarrhea, heartburn, melena, nausea and vomiting.       No hematuria.   Genitourinary: Positive for hematuria (3 weeks ago and again this morning). Negative for dysuria, frequency and urgency.  Musculoskeletal: Negative for back pain, falls and myalgias.  Skin: Negative for itching and rash.  Neurological: Negative  for dizziness, weakness and headaches.  Endo/Heme/Allergies: Negative for environmental allergies. Does not bruise/bleed easily.       Diabetes- episode of hypoglycemia.  Psychiatric/Behavioral: Negative for depression. The patient is not nervous/anxious.   All other systems reviewed and are negative.  Performance status (ECOG): 2 - Symptomatic, <50% confined to bed  Vitals Blood pressure (!) 156/75, pulse 68, temperature (!) 95.9 F (35.5 C), temperature source Tympanic, weight 243 lb 9.7 oz (110.5 kg), SpO2 100 %.   Physical Exam  Constitutional: He is oriented to person, place, and time. He appears well-developed and well-nourished. No distress. Face mask in place.  Patient needed assistance onto exam room table.   HENT:  Head: Normocephalic and atraumatic.  Right Ear: Hearing normal.  Left Ear: Hearing normal.  Mouth/Throat: Oropharynx is clear and moist and mucous membranes are normal. No oral lesions.  Cap.   Eyes: Pupils are equal, round, and reactive to light. Conjunctivae and EOM are normal. Right eye exhibits no discharge. Left eye exhibits no discharge. No scleral icterus.  Brown eyes.  Neck: No JVD present.  Cardiovascular: Normal rate,  regular rhythm and normal heart sounds. Exam reveals no gallop and no friction rub.  No murmur heard. Pulmonary/Chest: Effort normal and breath sounds normal. He has no wheezes. He has no rhonchi. He has no rales.  Abdominal: Soft. Normal appearance and bowel sounds are normal. He exhibits no mass. There is no hepatosplenomegaly. There is no abdominal tenderness. There is no CVA tenderness.  Musculoskeletal:        General: Edema (chronic in legs) present. No tenderness. Normal range of motion.     Cervical back: Normal range of motion and neck supple.  Lymphadenopathy:       Head (right side): No preauricular, no posterior auricular and no occipital adenopathy present.       Head (left side): No preauricular, no posterior auricular and no occipital adenopathy present.    He has no cervical adenopathy.       Right cervical: No superficial cervical adenopathy present.      Left cervical: No superficial cervical adenopathy present.    He has no axillary adenopathy.       Right: No inguinal and no supraclavicular adenopathy present.       Left: No inguinal and no supraclavicular adenopathy present.  Neurological: He is alert and oriented to person, place, and time.  Skin: Skin is warm, dry and intact. No bruising, no lesion and no rash noted. He is not diaphoretic. No erythema. No pallor.  Psychiatric: He has a normal mood and affect. His behavior is normal. Judgment and thought content normal.     Appointment on 03/07/2020  Component Date Value Ref Range Status  . Sodium 03/07/2020 137  135 - 145 mmol/L Final  . Potassium 03/07/2020 3.9  3.5 - 5.1 mmol/L Final  . Chloride 03/07/2020 102  98 - 111 mmol/L Final  . CO2 03/07/2020 27  22 - 32 mmol/L Final  . Glucose, Bld 03/07/2020 163* 70 - 99 mg/dL Final   Glucose reference range applies only to samples taken after fasting for at least 8 hours.  . BUN 03/07/2020 20  8 - 23 mg/dL Final  . Creatinine, Ser 03/07/2020 1.15  0.61 - 1.24 mg/dL  Final  . Calcium 03/07/2020 9.3  8.9 - 10.3 mg/dL Final  . Total Protein 03/07/2020 6.5  6.5 - 8.1 g/dL Final  . Albumin 03/07/2020 3.6  3.5 - 5.0 g/dL Final  .  AST 03/07/2020 17  15 - 41 U/L Final  . ALT 03/07/2020 25  0 - 44 U/L Final  . Alkaline Phosphatase 03/07/2020 87  38 - 126 U/L Final  . Total Bilirubin 03/07/2020 0.7  0.3 - 1.2 mg/dL Final  . GFR calc non Af Amer 03/07/2020 58* >60 mL/min Final  . GFR calc Af Amer 03/07/2020 >60  >60 mL/min Final  . Anion gap 03/07/2020 8  5 - 15 Final   Performed at [facility], [address], [place], [place] [address]  . WBC 03/07/2020 6.7  4.0 - 10.5 K/uL Final  . RBC 03/07/2020 4.11* 4.22 - 5.81 MIL/uL Final  . Hemoglobin 03/07/2020 11.5* 13.0 - 17.0 g/dL Final  . HCT 03/07/2020 36.2* 39.0 - 52.0 % Final  . MCV 03/07/2020 88.1  80.0 - 100.0 fL Final  . MCH 03/07/2020 28.0  26.0 - 34.0 pg Final  . MCHC 03/07/2020 31.8  30.0 - 36.0 g/dL Final  . RDW 03/07/2020 14.7  11.5 - 15.5 % Final  . Platelets 03/07/2020 192  150 - 400 K/uL Final  . nRBC 03/07/2020 0.0  0.0 - 0.2 % Final  . Neutrophils Relative % 03/07/2020 65  % Final  . Neutro Abs 03/07/2020 4.4  1.7 - 7.7 K/uL Final  . Lymphocytes Relative 03/07/2020 25  % Final  . Lymphs Abs 03/07/2020 1.7  0.7 - 4.0 K/uL Final  . Monocytes Relative 03/07/2020 7  % Final  . Monocytes Absolute 03/07/2020 0.4  0.1 - 1.0 K/uL Final  . Eosinophils Relative 03/07/2020 2  % Final  . Eosinophils Absolute 03/07/2020 0.1  0.0 - 0.5 K/uL Final  . Basophils Relative 03/07/2020 1  % Final  . Basophils Absolute 03/07/2020 0.0  0.0 - 0.1 K/uL Final  . Immature Granulocytes 03/07/2020 0  % Final  . Abs Immature Granulocytes 03/07/2020 0.02  0.00 - 0.07 K/uL Final   Performed at [facility], [address], [place], [place] [address]    Assessment:  Brandon Cosby Sr. is a 86 y.o. male with metastatic prostate cancer. He presented with gross hematuria. PSAwas 2241 on  11/06/2019.   Renal stone CTon 11/05/2019 revealed confluentnodular massesadjacent to the prostate and distal sigmoid colon with left pelvic, perirectal, retroperitoneal, and mesenteric adenopathy as well as probable peritoneal diseasemost marked in the right upper quadrant extending about the right diaphragmatic leaflet. There was pelvic and suspected vertebral osseous metastatic diseasewith L1 compression fracture, likely pathologic.There was a possible 2 cm right lower lobe lung nodule.There was left hydronephrosisand proximal ureterectasis secondary to pelvic process (prior renal ultrasound on 10/16/2019 revealed no hydronephrosis).  Chest CTon 11/08/2019 revealed right pleural effusion with pleural metastasis(2.7 x 2.1 cm nodularity along anterior right hemidiaphragm). Osseous metastasis, including a dominant lesioninvolving the right-side of the L2vertebral body.   Bone scanon 11/08/2019 revealed widespread bony metastasis(right occiput, clavicles, left upper extremity, sternum, ribs bilaterally, throughout the cervical, thoracic, lumbar spine, bony pelvis, proximal femurs, right tibia). Patient declined biopsyfor NGS.  He received degarelixon 11/11/2019(last 12/09/2019). He began abirateroneon 11/14/2019.  PSAhas been followed: 32.82 on 12/22/2018, 33.08 on 12/30/2018, 122.41 on 05/27/2019, 2241 on 11/06/2019, 216 on 12/09/2019, 80.45 01/06/2020, 54.06 on 02/06/2020 and 37.73 on 03/07/2020.  He has a monoclonal gammopathy. SPEPon 10/16/2019 revealed a 0.4 gm/dL + an additional 0.2 gm/dL biclonal IgAprotein with kappa specificity. Bone marrowon 11/14/2019 revealed a hypercellular bone marrow for age with metastatic carcinomac/w prostate cancer. There was slight plasmacytosis (4% plasma cells).  Plasma cells displayed polyclonal staining for kappa and lambda light chains with kappa light chain excess. 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 Cytogenetics were normal (58, XX). 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  He has a history of DVTin the upper deep calf vein in the region of the venous trifurcation on 07/04/2019.He was on Eliquis,but discontinued secondary to hematuria.IVC filter was placed on 11/07/2019.  He is hepatitis C positive.  Testing occurred on 10/17/2019.  He has stage IIIa chronic kidney disease. Creatinine is 1.4.  He has received both doses of the COVID-19 vaccine on 11/03/2019.   Symptomatically, he is getting stronger at [facility].  He has had some episodes of hematuria.  Exam is stable.  Plan: 1.  Labs today: CBC with diff, CMP, PSA, SPEP.  2. Metastatic prostatic prostate cancer Symptomatically, he is doing well Hehas extensive pelvic, perirectal, retroperitoneal and mesenteric adenopathy. Hehas extensive osseous metastasis. Suspect pleural based nodularity is prostate cancer. No currrent intervention for L1 pathologic compression as patient is asymptomatic. Labs reviewed. Degarelix today. Abiraterone and prednisone began on 11/14/2019. He continues to tolerate this treatment well Care facility dispenses his medication. PSA has decreased from2241 on 01/17/2021to 37.73 today. Continue plan with monthly degarelix and daily abiraterone plus prednisone. 3. Bone metastasis Anticipate monthly Xgeva after dental clearance. Patient's daughter to contact care facility for a dental exam. 4.Hematuria, intermittent Patient notes intermittent hematuria since last visit. Aspirin, Plavix, and Eliquis held. Patient s/p IVC filter placement on 11/07/2019. Continue to monitor. 5.Normocytic anemia Hematocrit 25.3.  Hemoglobin 7.8. MCV 87.5 on 11/15/2019. Hematocrit 35.0. Hemoglobin 11.0. MCV 87.7 on 12/08/2019. Hematocrit 32.0. Hemoglobin10.1. MCV87.7on 01/06/2020. Heis s/p 1 unit of PRBCs on 11/05/2019. Ferritin 549 (acute phase reactant) with iron saturation 15% and TIBC 190on 12/09/2019. B12 and folate were normal on 11/08/2019. Continue to monitor. 6. Monoclonal gammopathyof unknown significance (MGUS) SPEP on 10/16/2019 revealed a 0.4 gm/dL + an additional 0.2 gm/dL biclonal IgA protein with kappa specificity. Bone marrow aspirate and biopsy on 11/15/2019 revealed a hypercellular bone marrow with metastatic carcinoma c/w prostate cancer.  There was slight plasmacytosis (4% plasma cells). Plasma cells werepolyclonal. M spike is 0.3 g/dL today.  Continue surveillance every 6 months. 7. Left lower extremity DVT Duplex on 07/04/2019 revealedupper deep calf vein in the region of the venous trifurcation. He continues on aspirin, Plavix and Eliquis. IVC filterwas placed on 11/07/2019. He has chronic lower extremity edema. Continue to monitor. 8. Weight loss, improving Patient has gained 5 pounds since last visit. Continue to monitor. 9.Degarelix today. 10.  RTC in 1 month for labs (CBC with diff, CMP, PSA) and degarelix. 11.  RTC in 2 months for MD assessment, labs (CBC with diff, CMP, PSA), and degarelix.  I discussed the assessment and treatment plan with the patient.  The patient was provided an opportunity to ask questions and all were answered.  The patient agreed with the plan and demonstrated an understanding of the instructions.  The patient was advised to call back if the symptoms worsen or if the condition fails to improve as anticipated.   Lequita Asal, MD, PhD    03/07/2020, 9:10 AM  I, Heywood Footman, am acting as Education administrator for [employer]. Mike Gip, MD, PhD.  I, Wenona [place] C. Mike Gip, MD, have reviewed the above documentation for accuracy and completeness, and I agree with the above.

## 2020-03-06 ENCOUNTER — Other Ambulatory Visit: Payer: Self-pay

## 2020-03-06 NOTE — Progress Notes (Signed)
Pre charting completed daughter provided information for chart.

## 2020-03-07 ENCOUNTER — Inpatient Hospital Stay: Payer: Medicare Other | Attending: Hematology and Oncology | Admitting: Hematology and Oncology

## 2020-03-07 ENCOUNTER — Encounter: Payer: Self-pay | Admitting: Hematology and Oncology

## 2020-03-07 ENCOUNTER — Inpatient Hospital Stay: Payer: Medicare Other

## 2020-03-07 VITALS — BP 156/75 | HR 68 | Temp 95.9°F | Wt 243.6 lb

## 2020-03-07 DIAGNOSIS — Z5111 Encounter for antineoplastic chemotherapy: Secondary | ICD-10-CM | POA: Insufficient documentation

## 2020-03-07 DIAGNOSIS — C778 Secondary and unspecified malignant neoplasm of lymph nodes of multiple regions: Secondary | ICD-10-CM | POA: Diagnosis not present

## 2020-03-07 DIAGNOSIS — D472 Monoclonal gammopathy: Secondary | ICD-10-CM | POA: Diagnosis not present

## 2020-03-07 DIAGNOSIS — C61 Malignant neoplasm of prostate: Secondary | ICD-10-CM | POA: Diagnosis present

## 2020-03-07 DIAGNOSIS — C7951 Secondary malignant neoplasm of bone: Secondary | ICD-10-CM | POA: Diagnosis not present

## 2020-03-07 DIAGNOSIS — Z86718 Personal history of other venous thrombosis and embolism: Secondary | ICD-10-CM | POA: Diagnosis not present

## 2020-03-07 DIAGNOSIS — D473 Essential (hemorrhagic) thrombocythemia: Secondary | ICD-10-CM | POA: Diagnosis not present

## 2020-03-07 DIAGNOSIS — I824Y2 Acute embolism and thrombosis of unspecified deep veins of left proximal lower extremity: Secondary | ICD-10-CM | POA: Diagnosis not present

## 2020-03-07 DIAGNOSIS — D649 Anemia, unspecified: Secondary | ICD-10-CM

## 2020-03-07 DIAGNOSIS — E871 Hypo-osmolality and hyponatremia: Secondary | ICD-10-CM

## 2020-03-07 LAB — CBC WITH DIFFERENTIAL/PLATELET
Abs Immature Granulocytes: 0.02 10*3/uL (ref 0.00–0.07)
Basophils Absolute: 0 10*3/uL (ref 0.0–0.1)
Basophils Relative: 1 %
Eosinophils Absolute: 0.1 10*3/uL (ref 0.0–0.5)
Eosinophils Relative: 2 %
HCT: 36.2 % — ABNORMAL LOW (ref 39.0–52.0)
Hemoglobin: 11.5 g/dL — ABNORMAL LOW (ref 13.0–17.0)
Immature Granulocytes: 0 %
Lymphocytes Relative: 25 %
Lymphs Abs: 1.7 10*3/uL (ref 0.7–4.0)
MCH: 28 pg (ref 26.0–34.0)
MCHC: 31.8 g/dL (ref 30.0–36.0)
MCV: 88.1 fL (ref 80.0–100.0)
Monocytes Absolute: 0.4 10*3/uL (ref 0.1–1.0)
Monocytes Relative: 7 %
Neutro Abs: 4.4 10*3/uL (ref 1.7–7.7)
Neutrophils Relative %: 65 %
Platelets: 192 10*3/uL (ref 150–400)
RBC: 4.11 MIL/uL — ABNORMAL LOW (ref 4.22–5.81)
RDW: 14.7 % (ref 11.5–15.5)
WBC: 6.7 10*3/uL (ref 4.0–10.5)
nRBC: 0 % (ref 0.0–0.2)

## 2020-03-07 LAB — COMPREHENSIVE METABOLIC PANEL
ALT: 25 U/L (ref 0–44)
AST: 17 U/L (ref 15–41)
Albumin: 3.6 g/dL (ref 3.5–5.0)
Alkaline Phosphatase: 87 U/L (ref 38–126)
Anion gap: 8 (ref 5–15)
BUN: 20 mg/dL (ref 8–23)
CO2: 27 mmol/L (ref 22–32)
Calcium: 9.3 mg/dL (ref 8.9–10.3)
Chloride: 102 mmol/L (ref 98–111)
Creatinine, Ser: 1.15 mg/dL (ref 0.61–1.24)
GFR calc Af Amer: >60 mL/min (ref >60)
GFR calc non Af Amer: 58 mL/min — ABNORMAL LOW (ref >60)
Glucose, Bld: 163 mg/dL — ABNORMAL HIGH (ref 70–99)
Potassium: 3.9 mmol/L (ref 3.5–5.1)
Sodium: 137 mmol/L (ref 135–145)
Total Bilirubin: 0.7 mg/dL (ref 0.3–1.2)
Total Protein: 6.5 g/dL (ref 6.5–8.1)

## 2020-03-07 LAB — PSA: Prostatic Specific Antigen: 37.73 ng/mL — ABNORMAL HIGH (ref 0.00–4.00)

## 2020-03-07 MED ORDER — DEGARELIX ACETATE 80 MG ~~LOC~~ SOLR
80.0000 mg | Freq: Once | SUBCUTANEOUS | Status: AC
  Administered 2020-03-07: 80 mg via SUBCUTANEOUS

## 2020-03-07 NOTE — Progress Notes (Signed)
Patient states that he is still having blood in his urine. He is not taking any aspirin or blood thinners. He states that the swelling has improved.

## 2020-03-08 LAB — PROTEIN ELECTROPHORESIS, SERUM
A/G Ratio: 1.3 (ref 0.7–1.7)
Albumin ELP: 3.5 g/dL (ref 2.9–4.4)
Alpha-1-Globulin: 0.2 g/dL (ref 0.0–0.4)
Alpha-2-Globulin: 0.8 g/dL (ref 0.4–1.0)
Beta Globulin: 0.9 g/dL (ref 0.7–1.3)
Gamma Globulin: 0.9 g/dL (ref 0.4–1.8)
Globulin, Total: 2.7 g/dL (ref 2.2–3.9)
M-Spike, %: 0.3 g/dL — ABNORMAL HIGH
Total Protein ELP: 6.2 g/dL (ref 6.0–8.5)

## 2020-03-27 ENCOUNTER — Other Ambulatory Visit: Payer: Self-pay | Admitting: Hematology and Oncology

## 2020-03-27 DIAGNOSIS — C7951 Secondary malignant neoplasm of bone: Secondary | ICD-10-CM

## 2020-03-27 NOTE — Telephone Encounter (Signed)
CBC with Differential/Platelet (Order [id number]) Contains abnormal data CBC with Differential/Platelet Order: [id number] Status:  Final result  Visible to patient:  Yes (MyChart)  Next appt:  04/04/2020 at 02:15 PM in Oncology ([facility])  Dx:  Hyponatremia; Monoclonal gammopathy; ...  Ref Range & Units 2 wk ago  WBC 4.0 - 10.5 K/uL 6.7   RBC 4.22 - 5.81 MIL/uL 4.11Low    Hemoglobin 13.0 - 17.0 g/dL 11.5Low    HCT 39.0 - 52.0 % 36.2Low    MCV 80.0 - 100.0 fL 88.1   MCH 26.0 - 34.0 pg 28.0   MCHC 30.0 - 36.0 g/dL 31.8   RDW 11.5 - 15.5 % 14.7   Platelets 150 - 400 K/uL 192   nRBC 0.0 - 0.2 % 0.0   Neutrophils Relative % % 65   Neutro Abs 1.7 - 7.7 K/uL 4.4   Lymphocytes Relative % 25   Lymphs Abs 0.7 - 4.0 K/uL 1.7   Monocytes Relative % 7   Monocytes Absolute 0.1 - 1.0 K/uL 0.4   Eosinophils Relative % 2   Eosinophils Absolute 0.0 - 0.5 K/uL 0.1   Basophils Relative % 1   Basophils Absolute 0.0 - 0.1 K/uL 0.0   Immature Granulocytes % 0   Abs Immature Granulocytes 0.00 - 0.07 K/uL 0.02   Comment: Performed at [facility], [address] [place], [place] [address]  Resulting Agency  [facility]      Specimen Collected: 03/07/20 08:46  Last Resulted: 03/07/20 08:55     Lab Flowsheet   Order Details   View Encounter   Lab and Collection Details   Routing   Result History         Other Results from 03/07/2020  Contains abnormal data Protein electrophoresis, serum  Status:  Edited Result - FINAL  Visible to patient:  Yes (MyChart)  Next appt:  04/04/2020 at 02:15 PM in Oncology ([facility])  Dx:  Hyponatremia; Monoclonal gammopathy; ... Order: [id number]  Ref Range & Units 2 wk ago  Total Protein ELP 6.0 - 8.5 g/dL 6.2   Albumin ELP 2.9 - 4.4 g/dL 3.5   Alpha-1-Globulin 0.0 - 0.4 g/dL 0.2   Alpha-2-Globulin 0.4 - 1.0 g/dL 0.8   Beta Globulin 0.7 - 1.3 g/dL 0.9   Gamma Globulin 0.4 - 1.8 g/dL 0.9   M-Spike, % Not Observed g/dL 0.3High     SPE Interp.  Comment   Comment: (NOTE)  Faint band in gamma region suspicious for monoclonal immunoglobulin.  This band may represent a benign spike as seen in older people or  could be a paraprotein as seen in Multiple Myeloma, Waldenstrom's  Macroglobulinemia or Lymphoma. Depending on clinical circumstances,  further diagnostic studies may include serum immunofixation or  serum free light chain quantitation.  Performed At: [facility]  [place], [place] [id number]  Rush Farmer MD CZ:[id number]   Comment  Comment   Comment: (NOTE)  Protein electrophoresis scan will follow via computer, mail, or  courier delivery.   Globulin, Total 2.2 - 3.9 g/dL 2.7 VC   A/G Ratio 0.7 - 1.7 1.3 VC   Resulting Agency  [facility]      Specimen Collected: 03/07/20 08:46  Last Resulted: 03/08/20 16:37     Lab Flowsheet   Order Details   View Encounter   Lab and Collection Details   Routing   Result History     VC=Value has a corrected status  Contains abnormal data PSA  Status:  Final result  Visible to patient:  Yes (MyChart)  Next appt:  04/04/2020 at 02:15 PM in Oncology ([facility])  Dx:  Hyponatremia; Monoclonal gammopathy; ... Order: [id number]  Ref Range & Units 2 wk ago  Prostatic Specific Antigen 0.00 - 4.00 ng/mL 37.73High    Comment: (NOTE)  While PSA levels of <=4.0 ng/ml are reported as reference range, some  men with levels below 4.0 ng/ml can have prostate cancer and many men  with PSA above 4.0 ng/ml do not have prostate cancer. Other tests  such as free PSA, age specific reference ranges, PSA velocity and PSA  doubling time may be helpful especially in men less than 75 years  old.  Performed at [facility], [place] [address] [place], [place]  [address]   Resulting Agency  [facility]      Specimen Collected: 03/07/20 08:46  Last Resulted: 03/07/20 13:21     Lab Flowsheet   Order Details   View Encounter    Lab and Collection Details   Routing   Result History           Contains abnormal data Comprehensive metabolic panel  Status:  Final result  Visible to patient:  Yes (MyChart)  Next appt:  04/04/2020 at 02:15 PM in Oncology ([facility])  Dx:  Hyponatremia; Monoclonal gammopathy; ... Order: [id number]  Ref Range & Units 2 wk ago  Sodium 135 - 145 mmol/L 137   Potassium 3.5 - 5.1 mmol/L 3.9   Chloride 98 - 111 mmol/L 102   CO2 22 - 32 mmol/L 27   Glucose, Bld 70 - 99 mg/dL 163High    Comment: Glucose reference range applies only to samples taken after fasting for at least 8 hours.  BUN 8 - 23 mg/dL 20   Creatinine, Ser 0.61 - 1.24 mg/dL 1.15   Calcium 8.9 - 10.3 mg/dL 9.3   Total Protein 6.5 - 8.1 g/dL 6.5   Albumin 3.5 - 5.0 g/dL 3.6   AST 15 - 41 U/L 17   ALT 0 - 44 U/L 25   Alkaline Phosphatase 38 - 126 U/L 87   Total Bilirubin 0.3 - 1.2 mg/dL 0.7   GFR calc non Af Amer >60 mL/min 58Low    GFR calc Af Amer >60 mL/min >60   Anion gap 5 - 15 8   Comment: Performed at [facility], [address] [place], [place] [address]  Resulting Agency  [facility] LAB      Specimen Collected: 03/07/20 08:46  Last Resulted: 03/07/20 09:09

## 2020-04-03 MED FILL — ABIRATERONE ACETATE 250 MG: 250 | 30 days supply | Qty: 120 | Fill #0

## 2020-04-04 ENCOUNTER — Ambulatory Visit: Payer: Medicare Other

## 2020-04-04 ENCOUNTER — Other Ambulatory Visit: Payer: Medicare Other

## 2020-04-09 ENCOUNTER — Inpatient Hospital Stay: Payer: Medicare Other

## 2020-04-09 ENCOUNTER — Inpatient Hospital Stay: Payer: Medicare Other | Attending: Hematology and Oncology

## 2020-04-09 ENCOUNTER — Other Ambulatory Visit: Payer: Self-pay

## 2020-04-09 VITALS — BP 165/80 | HR 74 | Temp 97.5°F | Resp 18

## 2020-04-09 DIAGNOSIS — Z5111 Encounter for antineoplastic chemotherapy: Secondary | ICD-10-CM | POA: Insufficient documentation

## 2020-04-09 DIAGNOSIS — C61 Malignant neoplasm of prostate: Secondary | ICD-10-CM

## 2020-04-09 DIAGNOSIS — N1831 Chronic kidney disease, stage 3a: Secondary | ICD-10-CM | POA: Diagnosis not present

## 2020-04-09 DIAGNOSIS — C7951 Secondary malignant neoplasm of bone: Secondary | ICD-10-CM | POA: Insufficient documentation

## 2020-04-09 DIAGNOSIS — D472 Monoclonal gammopathy: Secondary | ICD-10-CM

## 2020-04-09 DIAGNOSIS — I824Y2 Acute embolism and thrombosis of unspecified deep veins of left proximal lower extremity: Secondary | ICD-10-CM

## 2020-04-09 LAB — COMPREHENSIVE METABOLIC PANEL
ALT: 24 U/L (ref 0–44)
AST: 19 U/L (ref 15–41)
Albumin: 3.7 g/dL (ref 3.5–5.0)
Alkaline Phosphatase: 72 U/L (ref 38–126)
Anion gap: 7 (ref 5–15)
BUN: 24 mg/dL — ABNORMAL HIGH (ref 8–23)
CO2: 24 mmol/L (ref 22–32)
Calcium: 9 mg/dL (ref 8.9–10.3)
Chloride: 104 mmol/L (ref 98–111)
Creatinine, Ser: 1.26 mg/dL — ABNORMAL HIGH (ref 0.61–1.24)
GFR calc Af Amer: 59 mL/min — ABNORMAL LOW (ref >60)
GFR calc non Af Amer: 51 mL/min — ABNORMAL LOW (ref >60)
Glucose, Bld: 158 mg/dL — ABNORMAL HIGH (ref 70–99)
Potassium: 3.9 mmol/L (ref 3.5–5.1)
Sodium: 135 mmol/L (ref 135–145)
Total Bilirubin: 0.6 mg/dL (ref 0.3–1.2)
Total Protein: 6.4 g/dL — ABNORMAL LOW (ref 6.5–8.1)

## 2020-04-09 LAB — CBC WITH DIFFERENTIAL/PLATELET
Abs Immature Granulocytes: 0.02 10*3/uL (ref 0.00–0.07)
Basophils Absolute: 0 10*3/uL (ref 0.0–0.1)
Basophils Relative: 0 %
Eosinophils Absolute: 0.1 10*3/uL (ref 0.0–0.5)
Eosinophils Relative: 2 %
HCT: 32.6 % — ABNORMAL LOW (ref 39.0–52.0)
Hemoglobin: 10.5 g/dL — ABNORMAL LOW (ref 13.0–17.0)
Immature Granulocytes: 0 %
Lymphocytes Relative: 16 %
Lymphs Abs: 1 10*3/uL (ref 0.7–4.0)
MCH: 28.5 pg (ref 26.0–34.0)
MCHC: 32.2 g/dL (ref 30.0–36.0)
MCV: 88.6 fL (ref 80.0–100.0)
Monocytes Absolute: 0.5 10*3/uL (ref 0.1–1.0)
Monocytes Relative: 7 %
Neutro Abs: 4.8 10*3/uL (ref 1.7–7.7)
Neutrophils Relative %: 75 %
Platelets: 174 10*3/uL (ref 150–400)
RBC: 3.68 MIL/uL — ABNORMAL LOW (ref 4.22–5.81)
RDW: 14.6 % (ref 11.5–15.5)
WBC: 6.5 10*3/uL (ref 4.0–10.5)
nRBC: 0 % (ref 0.0–0.2)

## 2020-04-09 LAB — PSA: Prostatic Specific Antigen: 30.57 ng/mL — ABNORMAL HIGH (ref 0.00–4.00)

## 2020-04-09 MED ORDER — DEGARELIX ACETATE 80 MG ~~LOC~~ SOLR
80.0000 mg | Freq: Once | SUBCUTANEOUS | Status: AC
  Administered 2020-04-09: 80 mg via SUBCUTANEOUS

## 2020-05-02 ENCOUNTER — Other Ambulatory Visit: Payer: Self-pay | Admitting: Hematology and Oncology

## 2020-05-02 ENCOUNTER — Inpatient Hospital Stay: Payer: Medicare Other | Attending: Nurse Practitioner | Admitting: Nurse Practitioner

## 2020-05-02 ENCOUNTER — Inpatient Hospital Stay: Payer: Medicare Other

## 2020-05-02 ENCOUNTER — Other Ambulatory Visit: Payer: Self-pay

## 2020-05-02 VITALS — BP 175/92 | HR 63 | Temp 96.6°F | Wt 254.9 lb

## 2020-05-02 DIAGNOSIS — C7951 Secondary malignant neoplasm of bone: Secondary | ICD-10-CM

## 2020-05-02 DIAGNOSIS — Z87891 Personal history of nicotine dependence: Secondary | ICD-10-CM | POA: Diagnosis not present

## 2020-05-02 DIAGNOSIS — J449 Chronic obstructive pulmonary disease, unspecified: Secondary | ICD-10-CM | POA: Insufficient documentation

## 2020-05-02 DIAGNOSIS — Z7902 Long term (current) use of antithrombotics/antiplatelets: Secondary | ICD-10-CM | POA: Insufficient documentation

## 2020-05-02 DIAGNOSIS — I10 Essential (primary) hypertension: Secondary | ICD-10-CM

## 2020-05-02 DIAGNOSIS — E785 Hyperlipidemia, unspecified: Secondary | ICD-10-CM | POA: Insufficient documentation

## 2020-05-02 DIAGNOSIS — Z79899 Other long term (current) drug therapy: Secondary | ICD-10-CM | POA: Diagnosis not present

## 2020-05-02 DIAGNOSIS — I252 Old myocardial infarction: Secondary | ICD-10-CM | POA: Diagnosis not present

## 2020-05-02 DIAGNOSIS — Z86718 Personal history of other venous thrombosis and embolism: Secondary | ICD-10-CM | POA: Insufficient documentation

## 2020-05-02 DIAGNOSIS — D472 Monoclonal gammopathy: Secondary | ICD-10-CM

## 2020-05-02 DIAGNOSIS — Z5111 Encounter for antineoplastic chemotherapy: Secondary | ICD-10-CM | POA: Diagnosis present

## 2020-05-02 DIAGNOSIS — Z794 Long term (current) use of insulin: Secondary | ICD-10-CM | POA: Diagnosis not present

## 2020-05-02 DIAGNOSIS — C61 Malignant neoplasm of prostate: Secondary | ICD-10-CM | POA: Diagnosis present

## 2020-05-02 DIAGNOSIS — C751 Malignant neoplasm of pituitary gland: Secondary | ICD-10-CM | POA: Diagnosis present

## 2020-05-02 DIAGNOSIS — G4730 Sleep apnea, unspecified: Secondary | ICD-10-CM | POA: Insufficient documentation

## 2020-05-02 DIAGNOSIS — G2581 Restless legs syndrome: Secondary | ICD-10-CM | POA: Diagnosis not present

## 2020-05-02 DIAGNOSIS — J4540 Moderate persistent asthma, uncomplicated: Secondary | ICD-10-CM | POA: Diagnosis not present

## 2020-05-02 DIAGNOSIS — D649 Anemia, unspecified: Secondary | ICD-10-CM | POA: Diagnosis not present

## 2020-05-02 DIAGNOSIS — N1831 Chronic kidney disease, stage 3a: Secondary | ICD-10-CM | POA: Insufficient documentation

## 2020-05-02 DIAGNOSIS — N390 Urinary tract infection, site not specified: Secondary | ICD-10-CM | POA: Diagnosis not present

## 2020-05-02 DIAGNOSIS — I2510 Atherosclerotic heart disease of native coronary artery without angina pectoris: Secondary | ICD-10-CM | POA: Diagnosis not present

## 2020-05-02 DIAGNOSIS — N400 Enlarged prostate without lower urinary tract symptoms: Secondary | ICD-10-CM | POA: Diagnosis not present

## 2020-05-02 DIAGNOSIS — Z7952 Long term (current) use of systemic steroids: Secondary | ICD-10-CM | POA: Insufficient documentation

## 2020-05-02 DIAGNOSIS — Z7901 Long term (current) use of anticoagulants: Secondary | ICD-10-CM | POA: Diagnosis not present

## 2020-05-02 DIAGNOSIS — I824Y2 Acute embolism and thrombosis of unspecified deep veins of left proximal lower extremity: Secondary | ICD-10-CM

## 2020-05-02 LAB — CBC WITH DIFFERENTIAL/PLATELET
Abs Immature Granulocytes: 0.03 10*3/uL (ref 0.00–0.07)
Basophils Absolute: 0 10*3/uL (ref 0.0–0.1)
Basophils Relative: 0 %
Eosinophils Absolute: 0.1 10*3/uL (ref 0.0–0.5)
Eosinophils Relative: 2 %
HCT: 32.4 % — ABNORMAL LOW (ref 39.0–52.0)
Hemoglobin: 10.7 g/dL — ABNORMAL LOW (ref 13.0–17.0)
Immature Granulocytes: 1 %
Lymphocytes Relative: 15 %
Lymphs Abs: 1 10*3/uL (ref 0.7–4.0)
MCH: 29.1 pg (ref 26.0–34.0)
MCHC: 33 g/dL (ref 30.0–36.0)
MCV: 88 fL (ref 80.0–100.0)
Monocytes Absolute: 0.4 10*3/uL (ref 0.1–1.0)
Monocytes Relative: 7 %
Neutro Abs: 4.8 10*3/uL (ref 1.7–7.7)
Neutrophils Relative %: 75 %
Platelets: 175 10*3/uL (ref 150–400)
RBC: 3.68 MIL/uL — ABNORMAL LOW (ref 4.22–5.81)
RDW: 14.6 % (ref 11.5–15.5)
WBC: 6.4 10*3/uL (ref 4.0–10.5)
nRBC: 0 % (ref 0.0–0.2)

## 2020-05-02 LAB — COMPREHENSIVE METABOLIC PANEL
ALT: 24 U/L (ref 0–44)
AST: 19 U/L (ref 15–41)
Albumin: 3.6 g/dL (ref 3.5–5.0)
Alkaline Phosphatase: 73 U/L (ref 38–126)
Anion gap: 8 (ref 5–15)
BUN: 23 mg/dL (ref 8–23)
CO2: 25 mmol/L (ref 22–32)
Calcium: 9.2 mg/dL (ref 8.9–10.3)
Chloride: 103 mmol/L (ref 98–111)
Creatinine, Ser: 1.27 mg/dL — ABNORMAL HIGH (ref 0.61–1.24)
GFR calc Af Amer: 59 mL/min — ABNORMAL LOW (ref >60)
GFR calc non Af Amer: 51 mL/min — ABNORMAL LOW (ref >60)
Glucose, Bld: 223 mg/dL — ABNORMAL HIGH (ref 70–99)
Potassium: 4.2 mmol/L (ref 3.5–5.1)
Sodium: 136 mmol/L (ref 135–145)
Total Bilirubin: 0.9 mg/dL (ref 0.3–1.2)
Total Protein: 6.4 g/dL — ABNORMAL LOW (ref 6.5–8.1)

## 2020-05-02 LAB — PSA: Prostatic Specific Antigen: 35.25 ng/mL — ABNORMAL HIGH (ref 0.00–4.00)

## 2020-05-02 MED ORDER — DEGARELIX ACETATE 80 MG ~~LOC~~ SOLR
80.0000 mg | Freq: Once | SUBCUTANEOUS | Status: AC
  Administered 2020-05-02: 80 mg via SUBCUTANEOUS

## 2020-05-02 NOTE — Progress Notes (Signed)
[name]  [address], [address] [place], [place] [address] Phone: [phone]  Fax: [phone]   Clinic Day:  05/02/2020  Referring physician: [name], PA-C  Chief Complaint: [name] is a 86 y.o. male with  a metastatic prostate cancer and stage III chronic kidney disease who is seen for a 2 month assessment.  HPI: Patient last seen by [name] on 03/07/2020.  Symptomatically was doing well at that time and monthly Firmagon and daily Zytiga plus prednisone were continued.  Today, feels well and denies specific complaints. He continues to use cane for ambulation. Resides at [facility]. Gaining weight. Denies elevated blood pressures at facility and says 'I take what they give me'. Has not had a dental exam. Denies hematuria but says he's been treated for a UTI and continues antibiotics. Does not believe aspirin, plavix, and eliquis has been held but he's unsure. Denies other UTI symptoms.  He has pain at the injection.  Endorses fatigue but.  Denies constipation and diarrhea,, cough, myalgias or arthralgias, or dyspepsia. No headaches, chest pain, shortness of breath. Has chronic leg swelling but this has improved.    Past Medical History:  Diagnosis Date  . Anemia, normocytic normochromic   . BPH (benign prostatic hyperplasia)   . CAD (coronary artery disease)   . Cataracts, bilateral   . Chronic renal insufficiency   . Colon polyp   . COPD (chronic obstructive pulmonary disease) ([place])   . Diabetes mellitus without complication ([place])   . DVT (deep venous thrombosis) ([place])   . Edema of both legs   . Glaucoma   . Gout   . Heart murmur   . Hepatitis C antibody test positive 10/20/2019  . Hyperlipidemia   . Hypertension   . Moderate persistent asthma without complication   . Myocardial infarction ([place])   . Neuromuscular disorder ([place])   . Neuropathy   . Peripheral vascular disease ([place])   . Premature ejaculation   .  Prostate cancer ([place])   . RLS (restless legs syndrome)   . Sleep apnea   . Stented coronary artery 11/13/2015  . Uncontrolled type 2 diabetes mellitus with stage 3 chronic kidney disease, with long-term current use of insulin ([place])     Past Surgical History:  Procedure Laterality Date  . APPENDECTOMY    . [place]  2009  . COLONOSCOPY  2012  . EYE SURGERY    . IVC FILTER INSERTION N/A 11/07/2019   Procedure: IVC FILTER INSERTION;  Surgeon: Algernon Huxley, MD;  Location: [facility];  Service: Cardiovascular;  Laterality: N/A;  . PROSTATE SURGERY    . TONSILLECTOMY      No family history on file.  Social History:  reports that he has quit smoking. He has never used smokeless tobacco. No history on file for alcohol use and drug use. He previously smoked <1 pack/day x 20 years (stopped smoking with cardiac stent placement). He previously drank liquor on the weekend (stopped 2020). He is retired Dance movement psychotherapist). He previously lived outside of [place]. He then moved to [place] then [place] to live with his daughter [name] [phone]). His wife died of cancer 1 year ago.He is currently living at [facility] assisted living.   Allergies:  Allergies  Allergen Reactions  . Metformin And Related Diarrhea    Intolerance due to naturally loose bowels    Current Medications: Current Outpatient Medications  Medication Sig Dispense Refill  .  abiraterone acetate (ZYTIGA) 250 MG tablet TAKE 4 TABLETS BY MOUTH DAILY ON AN EMPTY STOMACH 1 HOUR OR 2 HOURS AFTER A MEAL 120 tablet 1  . atorvastatin (LIPITOR) 80 MG tablet Take 80 mg by mouth daily.    . carvedilol (COREG) 25 MG tablet Take 25 mg by mouth 2 (two) times daily with a meal.    . Cholecalciferol (VITAMIN D) 50 MCG (2000 UT) tablet Take 2,000 Units by mouth daily.     . DULoxetine (CYMBALTA) 60 MG capsule Take 60 mg by mouth daily.    . famotidine (PEPCID) 20 MG tablet Take 20 mg by  mouth at bedtime as needed for heartburn.     . feeding supplement, ENSURE ENLIVE, (ENSURE ENLIVE) LIQD Take 237 mLs by mouth 4 (four) times daily -  with meals and at bedtime. 237 mL 12  . finasteride (PROSCAR) 5 MG tablet Take 5 mg by mouth daily.    Marland Kitchen gabapentin (NEURONTIN) 100 MG capsule Take 1 capsule (100 mg total) by mouth 2 (two) times daily. 60 capsule 0  . insulin aspart (NOVOLOG) 100 UNIT/ML injection Inject 5 Units into the skin 3 (three) times daily with meals. 10 mL 11  . Insulin Detemir (LEVEMIR) 100 UNIT/ML Pen Inject 15 Units into the skin daily. 20 mL 0  . Multiple Vitamin (MULTI-VITAMIN) tablet Take 1 tablet by mouth daily.    . nitroGLYCERIN (NITROSTAT) 0.4 MG SL tablet Place 0.4 mg under the tongue every 5 (five) minutes as needed for chest pain.     . polyethylene glycol (MIRALAX / GLYCOLAX) 17 g packet Take 17 g by mouth daily. 14 each 0  . predniSONE (DELTASONE) 5 MG tablet TAKE 1 TABLET BY MOUTH DAILY WITH BREAKFAST. BEGIN TAKING WITH INITIATION OF ABIRATERONE. 30 tablet 0  . senna-docusate (SENOKOT-S) 8.6-50 MG tablet Take 1 tablet by mouth 2 (two) times daily. 10 tablet 0   No current facility-administered medications for this visit.    Review of Systems  Constitutional: Negative for chills, diaphoresis, fever, malaise/fatigue and weight loss (weight up).  HENT: Negative for congestion, ear discharge, ear pain, hearing loss, sore throat and tinnitus.   Eyes: Negative for blurred vision and double vision.  Respiratory: Negative for cough and shortness of breath.   Cardiovascular: Negative for chest pain, palpitations and leg swelling (hx of dvt: improved).  Gastrointestinal: Negative for abdominal pain, blood in stool, constipation, diarrhea, heartburn, melena, nausea and vomiting.  Genitourinary: Negative for dysuria, frequency, hematuria (denies; being treated for uti) and urgency.  Musculoskeletal: Negative for back pain, falls and myalgias.  Skin: Negative for  itching and rash.  Neurological: Negative for dizziness, weakness and headaches.  Endo/Heme/Allergies: Negative for environmental allergies. Does not bruise/bleed easily.       Diabetes  Psychiatric/Behavioral: Negative for depression. The patient is not nervous/anxious.   All other systems reviewed and are negative.  Performance status (ECOG): 1-2  Vitals Blood pressure (!) 175/92, pulse 63, temperature (!) 96.6 F (35.9 C), temperature source Tympanic, weight 254 lb 13.6 oz (115.6 kg), SpO2 100 %.   Physical Exam Constitutional:      General: He is not in acute distress.    Appearance: Normal appearance. He is well-developed. He is not diaphoretic.     Interventions: Face mask in place.  HENT:     Head: Normocephalic and atraumatic.     Right Ear: Hearing normal.     Left Ear: Hearing normal.     Mouth/Throat:  Mouth: No oral lesions.  Eyes:     General: No scleral icterus.    Conjunctiva/sclera: Conjunctivae normal.     Comments: Brown eyes.  Neck:     Vascular: No JVD.  Cardiovascular:     Rate and Rhythm: Normal rate and regular rhythm.     Heart sounds: Murmur heard.   Pulmonary:     Effort: Pulmonary effort is normal.     Breath sounds: Normal breath sounds. No wheezing, rhonchi or rales.  Abdominal:     Palpations: Abdomen is soft. There is no mass.     Tenderness: There is no abdominal tenderness.  Musculoskeletal:        General: No tenderness. Normal range of motion.     Cervical back: Normal range of motion and neck supple.  Lymphadenopathy:     Head:     Right side of head: No preauricular, posterior auricular or occipital adenopathy.     Left side of head: No preauricular, posterior auricular or occipital adenopathy.     Cervical: No cervical adenopathy.  Skin:    General: Skin is warm and dry.     Findings: No rash.  Neurological:     Mental Status: He is alert and oriented to person, place, and time.  Psychiatric:        Behavior: Behavior  normal.        Thought Content: Thought content normal.        Judgment: Judgment normal.     Appointment on 05/02/2020  Component Date Value Ref Range Status  . WBC 05/02/2020 6.4  4.0 - 10.5 K/uL Final  . RBC 05/02/2020 3.68* 4.22 - 5.81 MIL/uL Final  . Hemoglobin 05/02/2020 10.7* 13.0 - 17.0 g/dL Final  . HCT 05/02/2020 32.4* 39 - 52 % Final  . MCV 05/02/2020 88.0  80.0 - 100.0 fL Final  . MCH 05/02/2020 29.1  26.0 - 34.0 pg Final  . MCHC 05/02/2020 33.0  30.0 - 36.0 g/dL Final  . RDW 05/02/2020 14.6  11.5 - 15.5 % Final  . Platelets 05/02/2020 175  150 - 400 K/uL Final  . nRBC 05/02/2020 0.0  0.0 - 0.2 % Final  . Neutrophils Relative % 05/02/2020 75  % Final  . Neutro Abs 05/02/2020 4.8  1.7 - 7.7 K/uL Final  . Lymphocytes Relative 05/02/2020 15  % Final  . Lymphs Abs 05/02/2020 1.0  0.7 - 4.0 K/uL Final  . Monocytes Relative 05/02/2020 7  % Final  . Monocytes Absolute 05/02/2020 0.4  0 - 1 K/uL Final  . Eosinophils Relative 05/02/2020 2  % Final  . Eosinophils Absolute 05/02/2020 0.1  0 - 0 K/uL Final  . Basophils Relative 05/02/2020 0  % Final  . Basophils Absolute 05/02/2020 0.0  0 - 0 K/uL Final  . Immature Granulocytes 05/02/2020 1  % Final  . Abs Immature Granulocytes 05/02/2020 0.03  0.00 - 0.07 K/uL Final   Performed at [facility], [address] [place], [place] [address]    Assessment:  Rowe Warman Sr. is a 86 y.o. male with metastatic prostate cancer. He presented with gross hematuria. PSAwas 2241 on 11/06/2019.   Renal stone CTon 11/05/2019 revealed confluentnodular massesadjacent to the prostate and distal sigmoid colon with left pelvic, perirectal, retroperitoneal, and mesenteric adenopathy as well as probable peritoneal diseasemost marked in the right upper quadrant extending about the right diaphragmatic leaflet. There was pelvic and suspected vertebral osseous metastatic diseasewith L1 compression fracture, likely  pathologic.There  was a possible 2 cm right lower lobe lung nodule.There was left hydronephrosisand proximal ureterectasis secondary to pelvic process (prior renal ultrasound on 10/16/2019 revealed no hydronephrosis).  Chest CTon 11/08/2019 revealed right pleural effusion with pleural metastasis(2.7 x 2.1 cm nodularity along anterior right hemidiaphragm). Osseous metastasis, including a dominant lesioninvolving the right-side of the L2vertebral body.   Bone scanon 11/08/2019 revealed widespread bony metastasis(right occiput, clavicles, left upper extremity, sternum, ribs bilaterally, throughout the cervical, thoracic, lumbar spine, bony pelvis, proximal femurs, right tibia). Patient declined biopsyfor NGS.  He received degarelixon 11/11/2019(last 12/09/2019). He began abirateroneon 11/14/2019.  PSAhas been followed: 32.82 on 12/22/2018, 33.08 on 12/30/2018, 122.41 on 05/27/2019, 2241 on 11/06/2019, 216 on 12/09/2019, 80.45 01/06/2020, 54.06 on 02/06/2020 and 37.73 on 03/07/2020.  He has a monoclonal gammopathy. SPEPon 10/16/2019 revealed a 0.4 gm/dL + an additional 0.2 gm/dL biclonal IgAprotein with kappa specificity. Bone marrowon 11/14/2019 revealed a hypercellular bone marrow for age with metastatic carcinomac/w prostate cancer. There was slight plasmacytosis (4% plasma cells). Plasma cells displayed polyclonal staining for kappa and lambda light chains with kappa light chain excess. Findings were most suggestive of early involvement by a plasma cell neoplasm. Cytogenetics were normal (37, XX). 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  He has a history of DVTin the upper deep calf vein in the region of the venous trifurcation on 07/04/2019.He was on Eliquis,but discontinued secondary to hematuria.IVC filter was placed on 11/07/2019.  He is hepatitis C positive.  Testing occurred on 10/17/2019.  He has stage IIIa chronic kidney disease. Creatinine is  1.4.  He has received both doses of the COVID-19 vaccine on 11/03/2019.   Symptomatically doing well. Strength has improved with PT and OT at [facility]. Tolerating zytiga, firmagon, and prednisone well. Reports good to slightly elevated blood sugars at home in setting of prednisone. Hypertensive today. Being treated for UTI. Exam is stable.   Plan: 1.  Labs today: CBC with diff, CMP, PSA 2. Metastatic prostatic prostate cancer  Extensive pelvic, perirectal, retroperitoneal, enteric adenopathy.  Extensive osseous metastases.  Suspect pleural-based nodularity is prostate cancer.  Known L1 pathologic compression fracture.  Symptomatically doing well.  Abiraterone and predisone began 11/14/2019.  Tolerating treatment well overall. Care facility dispenses medication.  Possibly elevated blood sugars secondary to.  Spoke to PCP will continue to manage blood sugars. UTI possibly secondary to tx (on antibiotics; see below). Hypertensive- zytiga may be contributing (see below.   PSA has decreased from 2241 (11/06/19) to 35.25. Slight elevation from 3 weeks ago but overall decreased. Monitor for trends however.   Recommend continued monthly degarelix and daily abiraterone plus prednisone. Patient in agreement.   3. Bone metastasis  Extensive bone disease. Known L1 pathologic compression fracture. Asymptomatic so no treatment recommended.   Patient has not had dental exam. Discussed with PCP who will coordinate dental exam for clearance for possible xgeva  Consider supplementing/optimizing calcium and vitamin d during treatment.  4.Hematuria  Etiology questionable- currently receiving treatment for UTI. Discussed with PCP who will check UA following treatment of UTI to ensure resolution.   Off anticoagulation secondary to severe hematuria in January 2021.   If unresolved, consider re-evaluation with urology.  5.Normocytic anemia  S/p 1 u pRBCs on 11/05/19  Ferritin 549 w/ iron sat  15% and tibc 190 on 12/09/19. Ferritin likely elevated as acute phase reactant.   B12 and folate were normal.    Hmg 10.7, Hct 32.4. Stable. Today.   Continue to monitor 6. Monoclonal gammopathyof  unknown significance (MGUS) SPEP on 10/16/2019 revealed a 0.4 gm/dL + an additional 0.2 gm/dL biclonal IgA protein with kappa specificity. Bone marrow aspirate and biopsy on 11/15/2019 revealed a hypercellular bone marrow with metastatic carcinoma c/w prostate cancer.  There was slight plasmacytosis (4% plasma cells). Plasma cells werepolyclonal. M spike is 0.3 g/dL on 03/07/20 which is stable from 6 months prior.   Continue surveillance with lab check in/around November 2021.  7. Left lower extremity DVT Duplex on 07/04/2019 revealedupper deep calf vein in the region of the venous trifurcation.  In January 2021 he developed severe hematuria and aspirin, plavix, and eliquis were discontinued. IVC filter was placed. Chronic lower extremity edema.   Continue to monitor.  8. Weight loss  Resolved. Weight continues to rise.  9. Hypertension  Pressure elevated in clinic. Patient reports compliance with medication.  Question is elevated pressure secondary to zytiga vs others. Discussed with PCP who will manage.  Continue to monitor.  10. Goals of Care  Previously seen by palliative care in hospital. Full code at that time. Treatment given with palliative intent. Consider referral to palliative care for discussion of advanced directives and ongoing symptom management.   Disposition:  Firmagon today. RTC in 1 month for labs (cbc, cmp, psa) and firmagon then again in 2 months for labs (cbc w/ diff, cmp, psa) and firmagon. Continue zytiga and prednisone.   I discussed the assessment and treatment plan with the patient.  The patient was provided an opportunity to ask questions and all were answered.  The patient agreed with  the plan and demonstrated an understanding of the instructions.  The patient was advised to call back if the symptoms worsen or if the condition fails to improve as anticipated.  A total of (45) minutes of face-to-face and non-face to face time was spent with this patient including but not limited to: review of records, imaging, labs, and speaking with his primary care doctor.   Beckey Rutter, DNP, AGNP-C [place] at [facility] [phone] (clinic)

## 2020-05-02 NOTE — Progress Notes (Signed)
No new changes noted today 

## 2020-05-03 ENCOUNTER — Encounter: Payer: Self-pay | Admitting: Nurse Practitioner

## 2020-05-08 MED FILL — ABIRATERONE ACETATE 250 MG: 250 | 30 days supply | Qty: 120 | Fill #1

## 2020-05-30 ENCOUNTER — Inpatient Hospital Stay: Payer: Medicare Other

## 2020-05-30 ENCOUNTER — Other Ambulatory Visit: Payer: Self-pay

## 2020-05-30 ENCOUNTER — Inpatient Hospital Stay: Payer: Medicare Other | Attending: Hematology and Oncology

## 2020-05-30 VITALS — BP 144/74 | HR 65 | Temp 96.1°F | Resp 16

## 2020-05-30 DIAGNOSIS — C782 Secondary malignant neoplasm of pleura: Secondary | ICD-10-CM | POA: Diagnosis not present

## 2020-05-30 DIAGNOSIS — D472 Monoclonal gammopathy: Secondary | ICD-10-CM | POA: Insufficient documentation

## 2020-05-30 DIAGNOSIS — Z86718 Personal history of other venous thrombosis and embolism: Secondary | ICD-10-CM | POA: Diagnosis not present

## 2020-05-30 DIAGNOSIS — Z7901 Long term (current) use of anticoagulants: Secondary | ICD-10-CM | POA: Diagnosis not present

## 2020-05-30 DIAGNOSIS — C7951 Secondary malignant neoplasm of bone: Secondary | ICD-10-CM | POA: Insufficient documentation

## 2020-05-30 DIAGNOSIS — Z7982 Long term (current) use of aspirin: Secondary | ICD-10-CM | POA: Insufficient documentation

## 2020-05-30 DIAGNOSIS — Z5111 Encounter for antineoplastic chemotherapy: Secondary | ICD-10-CM | POA: Insufficient documentation

## 2020-05-30 DIAGNOSIS — Z79899 Other long term (current) drug therapy: Secondary | ICD-10-CM | POA: Insufficient documentation

## 2020-05-30 DIAGNOSIS — C61 Malignant neoplasm of prostate: Secondary | ICD-10-CM | POA: Insufficient documentation

## 2020-05-30 LAB — CBC WITH DIFFERENTIAL/PLATELET
Abs Immature Granulocytes: 0.01 K/uL (ref 0.00–0.07)
Basophils Absolute: 0 K/uL (ref 0.0–0.1)
Basophils Relative: 0 %
Eosinophils Absolute: 0.1 K/uL (ref 0.0–0.5)
Eosinophils Relative: 2 %
HCT: 34 % — ABNORMAL LOW (ref 39.0–52.0)
Hemoglobin: 10.9 g/dL — ABNORMAL LOW (ref 13.0–17.0)
Immature Granulocytes: 0 %
Lymphocytes Relative: 15 %
Lymphs Abs: 0.9 K/uL (ref 0.7–4.0)
MCH: 28.8 pg (ref 26.0–34.0)
MCHC: 32.1 g/dL (ref 30.0–36.0)
MCV: 89.9 fL (ref 80.0–100.0)
Monocytes Absolute: 0.4 K/uL (ref 0.1–1.0)
Monocytes Relative: 6 %
Neutro Abs: 4.8 K/uL (ref 1.7–7.7)
Neutrophils Relative %: 77 %
Platelets: 169 K/uL (ref 150–400)
RBC: 3.78 MIL/uL — ABNORMAL LOW (ref 4.22–5.81)
RDW: 14.3 % (ref 11.5–15.5)
WBC: 6.2 K/uL (ref 4.0–10.5)
nRBC: 0 % (ref 0.0–0.2)

## 2020-05-30 LAB — COMPREHENSIVE METABOLIC PANEL
ALT: 22 U/L (ref 0–44)
AST: 16 U/L (ref 15–41)
Albumin: 3.6 g/dL (ref 3.5–5.0)
Alkaline Phosphatase: 66 U/L (ref 38–126)
Anion gap: 7 (ref 5–15)
BUN: 21 mg/dL (ref 8–23)
CO2: 25 mmol/L (ref 22–32)
Calcium: 9.1 mg/dL (ref 8.9–10.3)
Chloride: 103 mmol/L (ref 98–111)
Creatinine, Ser: 1.21 mg/dL (ref 0.61–1.24)
GFR calc Af Amer: >60 mL/min (ref >60)
GFR calc non Af Amer: 54 mL/min — ABNORMAL LOW (ref >60)
Glucose, Bld: 233 mg/dL — ABNORMAL HIGH (ref 70–99)
Potassium: 3.9 mmol/L (ref 3.5–5.1)
Sodium: 135 mmol/L (ref 135–145)
Total Bilirubin: 0.8 mg/dL (ref 0.3–1.2)
Total Protein: 6.6 g/dL (ref 6.5–8.1)

## 2020-05-30 LAB — PSA: Prostatic Specific Antigen: 45.03 ng/mL — ABNORMAL HIGH (ref 0.00–4.00)

## 2020-05-30 MED ORDER — DEGARELIX ACETATE 80 MG ~~LOC~~ SOLR
80.0000 mg | Freq: Once | SUBCUTANEOUS | Status: AC
  Administered 2020-05-30: 80 mg via SUBCUTANEOUS

## 2020-06-06 ENCOUNTER — Other Ambulatory Visit: Payer: Self-pay | Admitting: Hematology and Oncology

## 2020-06-06 ENCOUNTER — Other Ambulatory Visit: Payer: Self-pay

## 2020-06-06 ENCOUNTER — Encounter: Payer: Medicare Other | Attending: Internal Medicine | Admitting: Internal Medicine

## 2020-06-06 DIAGNOSIS — N189 Chronic kidney disease, unspecified: Secondary | ICD-10-CM | POA: Insufficient documentation

## 2020-06-06 DIAGNOSIS — Z833 Family history of diabetes mellitus: Secondary | ICD-10-CM | POA: Diagnosis not present

## 2020-06-06 DIAGNOSIS — E1122 Type 2 diabetes mellitus with diabetic chronic kidney disease: Secondary | ICD-10-CM | POA: Insufficient documentation

## 2020-06-06 DIAGNOSIS — E1140 Type 2 diabetes mellitus with diabetic neuropathy, unspecified: Secondary | ICD-10-CM | POA: Diagnosis not present

## 2020-06-06 DIAGNOSIS — Z87891 Personal history of nicotine dependence: Secondary | ICD-10-CM | POA: Insufficient documentation

## 2020-06-06 DIAGNOSIS — L97929 Non-pressure chronic ulcer of unspecified part of left lower leg with unspecified severity: Secondary | ICD-10-CM | POA: Insufficient documentation

## 2020-06-06 DIAGNOSIS — J449 Chronic obstructive pulmonary disease, unspecified: Secondary | ICD-10-CM | POA: Diagnosis not present

## 2020-06-06 DIAGNOSIS — I130 Hypertensive heart and chronic kidney disease with heart failure and stage 1 through stage 4 chronic kidney disease, or unspecified chronic kidney disease: Secondary | ICD-10-CM | POA: Diagnosis not present

## 2020-06-06 DIAGNOSIS — E785 Hyperlipidemia, unspecified: Secondary | ICD-10-CM | POA: Insufficient documentation

## 2020-06-06 DIAGNOSIS — I252 Old myocardial infarction: Secondary | ICD-10-CM | POA: Diagnosis not present

## 2020-06-06 DIAGNOSIS — E11622 Type 2 diabetes mellitus with other skin ulcer: Secondary | ICD-10-CM | POA: Insufficient documentation

## 2020-06-06 DIAGNOSIS — M1990 Unspecified osteoarthritis, unspecified site: Secondary | ICD-10-CM | POA: Diagnosis not present

## 2020-06-06 DIAGNOSIS — I890 Lymphedema, not elsewhere classified: Secondary | ICD-10-CM | POA: Diagnosis not present

## 2020-06-06 DIAGNOSIS — C61 Malignant neoplasm of prostate: Secondary | ICD-10-CM | POA: Insufficient documentation

## 2020-06-06 DIAGNOSIS — C7951 Secondary malignant neoplasm of bone: Secondary | ICD-10-CM | POA: Insufficient documentation

## 2020-06-06 DIAGNOSIS — C7801 Secondary malignant neoplasm of right lung: Secondary | ICD-10-CM | POA: Diagnosis not present

## 2020-06-06 DIAGNOSIS — Z86718 Personal history of other venous thrombosis and embolism: Secondary | ICD-10-CM | POA: Insufficient documentation

## 2020-06-06 DIAGNOSIS — I5032 Chronic diastolic (congestive) heart failure: Secondary | ICD-10-CM | POA: Insufficient documentation

## 2020-06-06 DIAGNOSIS — H42 Glaucoma in diseases classified elsewhere: Secondary | ICD-10-CM | POA: Insufficient documentation

## 2020-06-06 DIAGNOSIS — I2510 Atherosclerotic heart disease of native coronary artery without angina pectoris: Secondary | ICD-10-CM | POA: Insufficient documentation

## 2020-06-06 DIAGNOSIS — E1139 Type 2 diabetes mellitus with other diabetic ophthalmic complication: Secondary | ICD-10-CM | POA: Insufficient documentation

## 2020-06-06 NOTE — Telephone Encounter (Signed)
CBC with Differential/Platelet Order: [id number] Status:  Final result Visible to patient:  Yes (not seen) Next appt:  06/27/2020 at 01:00 PM in Oncology ([facility]) Dx:  Prostate cancer metastatic to bone ([place])  0 Result Notes  Ref Range & Units 7 d ago 1 mo ago  WBC 4.0 - 10.5 K/uL 6.2  6.4   RBC 4.22 - 5.81 MIL/uL 3.78Low  3.68Low   Hemoglobin 13.0 - 17.0 g/dL 10.9Low  10.7Low   HCT 39 - 52 % 34.0Low  32.4Low   MCV 80.0 - 100.0 fL 89.9  88.0   MCH 26.0 - 34.0 pg 28.8  29.1   MCHC 30.0 - 36.0 g/dL 32.1  33.0   RDW 11.5 - 15.5 % 14.3  14.6   Platelets 150 - 400 K/uL 169  175   nRBC 0.0 - 0.2 % 0.0  0.0   Neutrophils Relative % % 77  75   Neutro Abs 1.7 - 7.7 K/uL 4.8  4.8   Lymphocytes Relative % 15  15   Lymphs Abs 0.7 - 4.0 K/uL 0.9  1.0   Monocytes Relative % 6  7   Monocytes Absolute 0 - 1 K/uL 0.4  0.4   Eosinophils Relative % 2  2   Eosinophils Absolute 0 - 0 K/uL 0.1  0.1   Basophils Relative % 0  0   Basophils Absolute 0 - 0 K/uL 0.0  0.0   Immature Granulocytes % 0  1   Abs Immature Granulocytes 0.00 - 0.07 K/uL 0.01  0.03 CM   Comment: Performed at [facility], [address] [place], [place] [address]  Resulting Agency  [facility]IN LAB [facility]      Specimen Collected: 05/30/20 11:08 Last Resulted: 05/30/20 11:17     Lab Flowsheet   Order Details   View Encounter   Lab and Collection Details   Routing   Result History     CM=Additional comments    Result Care Coordination  Patient Communication  Add Comments Add Notifications Back to Top      Other Results from 05/30/2020  Contains abnormal dataPSA  Status:  Final result Visible to patient:  Yes (not seen) Next appt:  06/27/2020 at 01:00 PM in Oncology ([facility]) Dx:  Prostate cancer metastatic to bone [facility]) Order: [id number]  0 Result Notes  Ref Range & Units 7 d ago 1 mo ago  Prostatic Specific Antigen 0.00 - 4.00 ng/mL 45.03High  35.25High CM    Comment: (NOTE)  While PSA levels of <=4.0 ng/ml are reported as reference range, some  men with levels below 4.0 ng/ml can have prostate cancer and many men  with PSA above 4.0 ng/ml do not have prostate cancer. Other tests  such as free PSA, age specific reference ranges, PSA velocity and PSA  doubling time may be helpful especially in men less than 64 years  old.  Performed at [facility], [place] [address] [place], [place]  [address]   Resulting Agency  [facility] LAB [facility]      Specimen Collected: 05/30/20 11:08 Last Resulted: 05/30/20 16:14     Lab Flowsheet   Order Details   View Encounter   Lab and Collection Details   Routing   Result History     CM=Additional comments    Result Care Coordination  Patient Communication  Add Comments Add Notifications Back to Top        Contains abnormal dataComprehensive  metabolic panel  Status:  Final result Visible to patient:  Yes (not seen) Next appt:  06/27/2020 at 01:00 PM in Oncology ([facility]) Dx:  Prostate cancer metastatic to bone [facility]) Order: [id number]  0 Result Notes  Ref Range & Units 7 d ago 1 mo ago  Sodium 135 - 145 mmol/L 135  136   Potassium 3.5 - 5.1 mmol/L 3.9  4.2   Chloride 98 - 111 mmol/L 103  103   CO2 22 - 32 mmol/L 25  25   Glucose, Bld 70 - 99 mg/dL 233High  223High CM   Comment: Glucose reference range applies only to samples taken after fasting for at least 8 hours.  BUN 8 - 23 mg/dL 21  23   Creatinine, Ser 0.61 - 1.24 mg/dL 1.21  1.27High   Calcium 8.9 - 10.3 mg/dL 9.1  9.2   Total Protein 6.5 - 8.1 g/dL 6.6  6.4Low   Albumin 3.5 - 5.0 g/dL 3.6  3.6   AST 15 - 41 U/L 16  19   ALT 0 - 44 U/L 22  24   Alkaline Phosphatase 38 - 126 U/L 66  73   Total Bilirubin 0.3 - 1.2 mg/dL 0.8  0.9   GFR calc non Af Amer >60 mL/min 54Low  51Low   GFR calc Af Amer >60 mL/min >60  59Low   Anion gap 5 - 15 7  8  CM   Comment: Performed at [facility], [address] [place], [place] [address]  Resulting Agency  [facility] CL[facility]      Specimen Collected: 05/30/20 11:08 Last Resulted: 05/30/20 11:32

## 2020-06-07 MED FILL — ABIRATERONE ACETATE 250 MG: 250 | 30 days supply | Qty: 120 | Fill #0

## 2020-06-07 NOTE — Progress Notes (Signed)
LEBARON, BAUTCH ([id number]) Visit Report for 06/06/2020 Abuse/Suicide Risk Screen Details Patient Name: [name], [name] Date of Service: 06/06/2020 3:15 PM Medical Record Number: [id number] Patient Account Number: [id number] Date of Birth/Sex: 07/18/1932 (86 y.o. M) Treating RN: Grover Canavan Primary Care Roldan Laforest: Philis Fendt Other Clinician: Referring Hassen Bruun: Philis Fendt Treating Makailah Slavick/Extender: Ricard Dillon Weeks in Treatment: 0 Abuse/Suicide Risk Screen Items Answer ABUSE RISK SCREEN: Has anyone close to you tried to hurt or harm you recentlyo No Do you feel uncomfortable with anyone in your familyo No Has anyone forced you do things that you didnot want to doo No Electronic Signature(s) Signed: 06/06/2020 4:57:56 PM By: Grover Canavan Entered By: Grover Canavan on 06/06/2020 15:12:07 [name] ([id number]) -------------------------------------------------------------------------------- Activities of Daily Living Details Patient Name: Brandon, [name] Date of Service: 06/06/2020 3:15 PM Medical Record Number: [id number] Patient Account Number: [id number] Date of Birth/Sex: [birth year] (86 y.o. M) Treating RN: Grover Canavan Primary Care Garrit Marrow: Philis Fendt Other Clinician: Referring Yaacov Koziol: Philis Fendt Treating Monicka Cyran/Extender: Tito Dine in Treatment: 0 Activities of Daily Living Items Answer Activities of Daily Living (Please select one for each item) Drive Automobile Not Able Take Medications Need Assistance Use Telephone Need Assistance Care for Appearance Need Assistance Use Toilet Need Assistance Bath / Shower Need Assistance Dress Self Need Assistance Feed Self Completely Able Walk Need Assistance Get In / Out Bed Need Assistance Housework Need Assistance Prepare Meals Need Assistance Handle Money Need Assistance Shop for Self Need Assistance Electronic Signature(s) Signed: 06/06/2020 4:57:56 PM By: Grover Canavan Entered By: Grover Canavan on 06/06/2020 15:12:37 Brandon, [name] ([id number]) -------------------------------------------------------------------------------- Education Screening Details Patient Name: [name] Date of Service: 06/06/2020 3:15 PM Medical Record Number: [id number] Patient Account Number: [id number] Date of Birth/Sex: Feb 08, 1932 (86 y.o. M) Treating RN: Grover Canavan Primary Care Avion Patella: Philis Fendt Other Clinician: Referring Liah Morr: Philis Fendt Treating Flint Hakeem/Extender: Tito Dine in Treatment: 0 Learning Preferences/Education Level/Primary Language Learning Preference: Explanation, Demonstration, Printed Material Highest Education Level: High School Preferred Language: English Cognitive Barrier Language Barrier: No Translator Needed: No Memory Deficit: No Emotional Barrier: No Cultural/Religious Beliefs Affecting Medical Care: No Physical Barrier Impaired Vision: No Impaired Hearing: No Decreased Hand dexterity: No Knowledge/Comprehension Knowledge Level: Medium Comprehension Level: Medium Ability to understand written instructions: Medium Ability to understand verbal instructions: Medium Motivation Anxiety Level: Calm Cooperation: Cooperative Education Importance: Acknowledges Need Interest in Health Problems: Asks Questions Perception: Coherent Willingness to Engage in Self-Management High Activities: Readiness to Engage in Self-Management High Activities: Electronic Signature(s) Signed: 06/06/2020 4:57:56 PM By: Grover Canavan Entered By: Grover Canavan on 06/06/2020 15:13:11 [name], [name] ([id number]) -------------------------------------------------------------------------------- Fall Risk Assessment Details Patient Name: [name] Date of Service: 06/06/2020 3:15 PM Medical Record Number: [id number] Patient Account Number: [id number] Date of Birth/Sex: [birth year] (86 y.o. M) Treating RN:  Grover Canavan Primary Care Beatric Fulop: Philis Fendt Other Clinician: Referring Fern Canova: Philis Fendt Treating Tome Wilson/Extender: Tito Dine in Treatment: 0 Fall Risk Assessment Items Have you had 2 or more falls in the last 12 monthso 0 Yes Have you had any fall that resulted in injury in the last 12 monthso 0 No FALLS RISK SCREEN History of falling - immediate or within 3 months 0 No Secondary diagnosis (Do you have 2 or more medical diagnoseso) 0 No Ambulatory aid None/bed rest/wheelchair/nurse 0 No Crutches/cane/walker 15 Yes Furniture 0 No Intravenous therapy Access/Saline/Heparin Lock 0 No Gait/Transferring Normal/ bed rest/ wheelchair 0 No Weak (short steps with or without shuffle, stooped but able to lift head  while walking, may 10 Yes seek support from furniture) Impaired (short steps with shuffle, may have difficulty arising from chair, head down, impaired 20 Yes balance) Mental Status Oriented to own ability 0 No Electronic Signature(s) Signed: 06/06/2020 4:57:56 PM By: Grover Canavan Entered By: Grover Canavan on 06/06/2020 15:14:01 [name] ([id number]) -------------------------------------------------------------------------------- Foot Assessment Details Patient Name: [name] Date of Service: 06/06/2020 3:15 PM Medical Record Number: [id number] Patient Account Number: [id number] Date of Birth/Sex: 1932/09/20 (86 y.o. M) Treating RN: Grover Canavan Primary Care Tiny Chaudhary: Philis Fendt Other Clinician: Referring Marley Charlot: Philis Fendt Treating Madalina Rosman/Extender: Tito Dine in Treatment: 0 Foot Assessment Items Site Locations + = Sensation present, - = Sensation absent, C = Callus, U = Ulcer R = Redness, W = Warmth, M = Maceration, PU = Pre-ulcerative lesion F = Fissure, S = Swelling, D = Dryness Assessment Right: Left: Other Deformity: No No Prior Foot Ulcer: No No Prior Amputation: No No Charcot  Joint: No No Ambulatory Status: Ambulatory With Help Assistance Device: Walker Gait: Administrator, arts) Signed: 06/06/2020 4:57:56 PM By: Grover Canavan Entered By: Grover Canavan on 06/06/2020 15:15:58 [name] ([id number]) -------------------------------------------------------------------------------- Nutrition Risk Screening Details Patient Name: [name] Date of Service: 06/06/2020 3:15 PM Medical Record Number: [id number] Patient Account Number: [id number] Date of Birth/Sex: 07-Dec-1931 (86 y.o. M) Treating RN: Grover Canavan Primary Care Kacie Huxtable: Philis Fendt Other Clinician: Referring Michaelann Gunnoe: Philis Fendt Treating Elma Limas/Extender: Ricard Dillon Weeks in Treatment: 0 Height (in): 71 Weight (lbs): 254 Body Mass Index (BMI): 35.4 Nutrition Risk Screening Items Score Screening NUTRITION RISK SCREEN: I have an illness or condition that made me change the kind and/or amount of food I eat 0 No I eat fewer than two meals per day 0 No I eat few fruits and vegetables, or milk products 0 No I have three or more drinks of beer, liquor or wine almost every day 0 No I have tooth or mouth problems that make it hard for me to eat 0 No I don't always have enough money to buy the food I need 0 No I eat alone most of the time 0 No I take three or more different prescribed or over-the-counter drugs a day 1 Yes Without wanting to, I have lost or gained 10 pounds in the last six months 0 No I am not always physically able to shop, cook and/or feed myself 0 No Nutrition Protocols Good Risk Protocol 0 No interventions needed Moderate Risk Protocol High Risk Proctocol Risk Level: Good Risk Score: 1 Electronic Signature(s) Signed: 06/06/2020 4:57:56 PM By: Grover Canavan Entered By: Grover Canavan on 06/06/2020 15:14:17

## 2020-06-08 NOTE — Progress Notes (Signed)
LAYLA, GRAMM ([id number]) Visit Report for 06/06/2020 Allergy List Details Patient Name: MACSEN, NUTTALL Date of Service: 06/06/2020 3:15 PM Medical Record Number: [id number] Patient Account Number: [id number] Date of Birth/Sex: July 17, 1932 (86 y.o. M) Treating RN: Grover Canavan Primary Care Yakov Bergen: Philis Fendt Other Clinician: Referring Raahim Shartzer: Philis Fendt Treating Micheala Morissette/Extender: Ricard Dillon Weeks in Treatment: 0 Allergies Active Allergies metformin Reaction: diarrhea Allergy Notes Electronic Signature(s) Signed: 06/06/2020 4:57:56 PM By: Grover Canavan Entered By: Grover Canavan on 06/06/2020 15:03:54 DASHAN, CHIZMAR ([id number]) -------------------------------------------------------------------------------- Arrival Information Details Patient Name: [name] Date of Service: 06/06/2020 3:15 PM Medical Record Number: [id number] Patient Account Number: [id number] Date of Birth/Sex: 11/10/1931 (86 y.o. M) Treating RN: Grover Canavan Primary Care Bindu Docter: Philis Fendt Other Clinician: Referring Aline Wesche: Philis Fendt Treating Hakop Humbarger/Extender: Tito Dine in Treatment: 0 Visit Information Patient Arrived: Wheel Chair Arrival Time: 14:52 Transfer Assistance: Manual History Since Last Visit Any new allergies or adverse reactions: No Had a fall or experienced change in activities of daily living that may affect risk of falls: No Hospitalized since last visit: No Electronic Signature(s) Signed: 06/06/2020 4:57:56 PM By: Grover Canavan Entered By: Grover Canavan on 06/06/2020 15:02:13 [name] ([id number]) -------------------------------------------------------------------------------- Clinic Level of Care Assessment Details Patient Name: [name] Date of Service: 06/06/2020 3:15 PM Medical Record Number: [id number] Patient Account Number: [id number] Date of Birth/Sex: 14-Jan-1932 (86 y.o. M) Treating RN:  Cornell Barman Primary Care Azucena Dart: Philis Fendt Other Clinician: Referring Siena Poehler: Philis Fendt Treating Jahmal Dunavant/Extender: Tito Dine in Treatment: 0 Clinic Level of Care Assessment Items TOOL 1 Quantity Score []  - Use when EandM and Procedure is performed on INITIAL visit 0 ASSESSMENTS - Nursing Assessment / Reassessment X - General Physical Exam (combine w/ comprehensive assessment (listed just below) when performed on new 1 20 pt. evals) X- 1 25 Comprehensive Assessment (HX, ROS, Risk Assessments, Wounds Hx, etc.) ASSESSMENTS - Wound and Skin Assessment / Reassessment []  - Dermatologic / Skin Assessment (not related to wound area) 0 ASSESSMENTS - Ostomy and/or Continence Assessment and Care []  - Incontinence Assessment and Management 0 []  - 0 Ostomy Care Assessment and Management (repouching, etc.) PROCESS - Coordination of Care X - Simple Patient / Family Education for ongoing care 1 15 []  - 0 Complex (extensive) Patient / Family Education for ongoing care X- 1 10 Staff obtains Programmer, systems, Records, Test Results / Process Orders []  - 0 Staff telephones HHA, Nursing Homes / Clarify orders / etc []  - 0 Routine Transfer to another Facility (non-emergent condition) []  - 0 Routine Hospital Admission (non-emergent condition) X- 1 15 New Admissions / Biomedical engineer / Ordering NPWT, Apligraf, etc. []  - 0 Emergency Hospital Admission (emergent condition) PROCESS - Special Needs []  - Pediatric / Minor Patient Management 0 []  - 0 Isolation Patient Management []  - 0 Hearing / Language / Visual special needs []  - 0 Assessment of Community assistance (transportation, D/C planning, etc.) []  - 0 Additional assistance / Altered mentation []  - 0 Support Surface(s) Assessment (bed, cushion, seat, etc.) INTERVENTIONS - Miscellaneous []  - External ear exam 0 []  - 0 Patient Transfer (multiple staff / Civil Service fast streamer / Similar devices) []  - 0 Simple Staple /  Suture removal (25 or less) []  - 0 Complex Staple / Suture removal (26 or more) []  - 0 Hypo/Hyperglycemic Management (do not check if billed separately) X- 1 15 Ankle / Brachial Index (ABI) - do not check if billed separately Has the patient been seen at the hospital within the last three  years: Yes Total Score: 100 Level Of Care: New/Established - Level 3 HIXON, Densel ([id number]) Electronic Signature(s) Signed: 06/07/2020 6:39:29 PM By: Gretta Cool, BSN, RN, CWS, Kim RN, BSN Entered By: Gretta Cool, BSN, RN, CWS, Kim on 06/06/2020 18:19:46 [name] ([id number]) -------------------------------------------------------------------------------- Compression Therapy Details Patient Name: [name] Date of Service: 06/06/2020 3:15 PM Medical Record Number: [id number] Patient Account Number: [id number] Date of Birth/Sex: 06-27-1932 (86 y.o. M) Treating RN: Cornell Barman Primary Care Thresea Doble: Philis Fendt Other Clinician: Referring Feather Berrie: Philis Fendt Treating Ariv Penrod/Extender: Ricard Dillon Weeks in Treatment: 0 Compression Therapy Performed for Wound Assessment: NonWound Condition Lymphedema - Bilateral Leg Performed By: Clinician Cornell Barman, RN Compression Type: Three Layer Pre Treatment ABI: 1.1 Post Procedure Diagnosis Same as Pre-procedure Electronic Signature(s) Signed: 06/06/2020 6:19:12 PM By: Gretta Cool, BSN, RN, CWS, Kim RN, BSN Entered By: Gretta Cool, BSN, RN, CWS, Kim on 06/06/2020 18:19:12 MAYCOL, HOYING ([id number]) -------------------------------------------------------------------------------- Encounter Discharge Information Details Patient Name: [name] Date of Service: 06/06/2020 3:15 PM Medical Record Number: [id number] Patient Account Number: [id number] Date of Birth/Sex: 1932/05/23 (86 y.o. M) Treating RN: Cornell Barman Primary Care Maryanne Huneycutt: Philis Fendt Other Clinician: Referring Symphanie Cederberg: Philis Fendt Treating Howard Patton/Extender: Tito Dine in Treatment: 0 Encounter Discharge Information Items Discharge Condition: Stable Ambulatory Status: Wheelchair Discharge Destination: Home Transportation: Private Auto Accompanied By: daughter Schedule Follow-up Appointment: Yes Clinical Summary of Care: Electronic Signature(s) Signed: 06/06/2020 6:22:01 PM By: Gretta Cool, BSN, RN, CWS, Kim RN, BSN Entered By: Gretta Cool, BSN, RN, CWS, Kim on 06/06/2020 18:22:01 [name] ([id number]) -------------------------------------------------------------------------------- Lower Extremity Assessment Details Patient Name: [name] Date of Service: 06/06/2020 3:15 PM Medical Record Number: [id number] Patient Account Number: [id number] Date of Birth/Sex: 1932/03/14 (86 y.o. M) Treating RN: Grover Canavan Primary Care Denali Sharma: Philis Fendt Other Clinician: Referring Cleva Camero: Philis Fendt Treating Aaliyan Brinkmeier/Extender: Ricard Dillon Weeks in Treatment: 0 Edema Assessment Assessed: [Left: Yes] [Right: Yes] Edema: [Left: Yes] [Right: Yes] Calf Left: Right: Point of Measurement: 27 cm From Medial Instep 45 cm 55 cm Ankle Left: Right: Point of Measurement: 13 cm From Medial Instep 34 cm 40.5 cm Vascular Assessment Pulses: Dorsalis Pedis Palpable: [Left:No] [Right:No] Doppler Audible: [Left:Yes] [Right:Yes] Posterior Tibial Doppler Audible: [Left:Yes] [Right:Yes] Blood Pressure: Brachial: [Left:176] [Right:180] Ankle: [Left:Dorsalis Pedis: 204 1.13] [Right:Dorsalis Pedis: 200 1.11] Electronic Signature(s) Signed: 06/06/2020 4:57:56 PM By: Grover Canavan Signed: 06/07/2020 6:39:29 PM By: Gretta Cool, BSN, RN, CWS, Kim RN, BSN Entered By: Gretta Cool, BSN, RN, CWS, Kim on 06/06/2020 15:51:40 [name] ([id number]) -------------------------------------------------------------------------------- Multi Wound Chart Details Patient Name: [name] Date of Service: 06/06/2020 3:15 PM Medical Record Number:  [id number] Patient Account Number: [id number] Date of Birth/Sex: [birth year] (86 y.o. M) Treating RN: Cornell Barman Primary Care Sharief Wainwright: Philis Fendt Other Clinician: Referring Aurore Redinger: Philis Fendt Treating Corie Vavra/Extender: Tito Dine in Treatment: 0 Vital Signs Height(in): 71 Pulse(bpm): 66 Weight(lbs): 254 Blood Pressure(mmHg): 162/71 Body Mass Index(BMI): 35 Temperature(F): 98.4 Respiratory Rate(breaths/min): 18 Wound Assessments Treatment Notes Electronic Signature(s) Signed: 06/06/2020 6:18:35 PM By: Gretta Cool, BSN, RN, CWS, Kim RN, BSN Entered By: Gretta Cool, BSN, RN, CWS, Kim on 06/06/2020 18:18:34 JAKOBEE, BRACKINS ([id number]) -------------------------------------------------------------------------------- Multi-Disciplinary Care Plan Details Patient Name: [name] Date of Service: 06/06/2020 3:15 PM Medical Record Number: [id number] Patient Account Number: [id number] Date of Birth/Sex: [birth year] (86 y.o. M) Treating RN: Cornell Barman Primary Care Amelya Mabry: Philis Fendt Other Clinician: Referring Frances Ambrosino: Philis Fendt Treating Roquel Burgin/Extender: Tito Dine in Treatment: 0 Active Inactive Orientation to the Wound Care Program Nursing Diagnoses: Knowledge deficit related to the  wound healing center program Goals: Patient/caregiver will verbalize understanding of the [facility] Date Initiated: 06/06/2020 Target Resolution Date: 06/06/2020 Goal Status: Active Interventions: Provide education on orientation to the wound center Notes: Venous Leg Ulcer Nursing Diagnoses: Knowledge deficit related to disease process and management Goals: Patient will maintain optimal edema control Date Initiated: 06/06/2020 Target Resolution Date: 06/20/2020 Goal Status: Active Interventions: Assess peripheral edema status every visit. Treatment Activities: Therapeutic compression applied : 06/06/2020 Notes: Wound/Skin  Impairment Nursing Diagnoses: Impaired tissue integrity Goals: Patient/caregiver will verbalize understanding of skin care regimen Date Initiated: 06/06/2020 Target Resolution Date: 06/20/2020 Goal Status: Active Interventions: Assess patient/caregiver ability to perform ulcer/skin care regimen upon admission and as needed Treatment Activities: Skin care regimen initiated : 06/06/2020 Topical wound management initiated : 06/06/2020 Notes: Electronic Signature(s) Signed: 06/06/2020 6:18:23 PM By: Gretta Cool, BSN, RN, CWS, Kim RN, BSN [place], [place] ([id number]) Entered By: Gretta Cool, BSN, RN, CWS, Kim on 06/06/2020 18:18:23 [name] ([id number]) -------------------------------------------------------------------------------- Non-Wound Condition Assessment Details Patient Name: [name] Date of Service: 06/06/2020 3:15 PM Medical Record Number: [id number] Patient Account Number: [id number] Date of Birth/Sex: 06/16/1932 (86 y.o. 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 M) Treating RN: Grover Canavan Primary Care Domonic Hiscox: Philis Fendt Other Clinician: Referring Jarvis Knodel: Philis Fendt Treating Kiyonna Tortorelli/Extender: Tito Dine in Treatment:  0 Active Problems Location of Pain Severity and Description of Pain Patient Has Paino Yes Site Locations Pain Location: Pain in Ulcers Rate the pain. Current Pain Level: 5 Character of Pain Describe the Pain: Throbbing Pain Management and Medication Current Pain Management: Medication: Yes Rest: Yes Electronic Signature(s) Signed: 06/06/2020 4:57:56 PM By: Grover Canavan Entered By: Grover Canavan on 06/06/2020 15:02:20 JURIS, GOSNELL ([id number]) -------------------------------------------------------------------------------- Patient/Caregiver Education Details Patient Name: [name] Date of Service: 06/06/2020 3:15 PM Medical Record Number: [id number] Patient Account Number: [id number] Date of Birth/Gender: 08-13-1932 (86 y.o. M) Treating RN: Cornell Barman Primary Care Physician: Philis Fendt Other Clinician: Referring Physician: Philis Fendt Treating Physician/Extender: Tito Dine in Treatment: 0 Education Assessment Education Provided To: Patient Education Topics Provided Welcome To [facility]: Handouts: [facility] Methods: Demonstration Responses: State content correctly Notes Patient instructed to use his lymphedema pumps daily Electronic Signature(s) Signed: 06/07/2020 6:39:29 PM By: Gretta Cool, BSN, RN, CWS, Kim RN, BSN Entered By: Gretta Cool, BSN, RN, CWS, Kim on 06/06/2020 18:20:55 EDRICK, WHITE[place]ORN ([id number]) -------------------------------------------------------------------------------- [place] Details Patient Name: [name] Date of Service: 06/06/2020 3:15 PM Medical Record Number: [id number] Patient Account Number: [id number] Date of Birth/Sex: [birth year] (86 y.o. M) Treating RN: Grover Canavan Primary Care Mikahla Wisor: Philis Fendt Other Clinician: Referring Blakelynn Scheeler: Philis Fendt Treating Caroleen Stoermer/Extender: Tito Dine in Treatment: 0 Vital Signs Time Taken: 14:55 Temperature  (F): 98.4 Height (in): 71 Pulse (bpm): 66 Source: Stated Respiratory Rate (breaths/min): 18 Weight (lbs): 254 Blood Pressure (mmHg): 162/71 Source: Stated Reference Range: 80 - 120 mg / dl Body Mass Index (BMI): 35.4 Electronic Signature(s) Signed: 06/06/2020 4:57:56 PM By: Grover Canavan Entered By: Grover Canavan  on 06/06/2020 15:03:00

## 2020-06-08 NOTE — Progress Notes (Signed)
[name], [name] ([id number]) Visit Report for 06/06/2020 Chief Complaint Document Details Patient Name: [name] Date of Service: 06/06/2020 3:15 PM Medical Record Number: [id number] Patient Account Number: [id number] Date of Birth/Sex: 1932-04-26 (86 y.o. M) Treating RN: Cornell Barman Primary Care Provider: Philis Fendt Other Clinician: Referring Provider: Philis Fendt Treating Provider/Extender: Tito Dine in Treatment: 0 Information Obtained from: Patient Chief Complaint Bilateral LE Lymphedema with left leg ulcer 06/06/2020; patient is here for review of bilateral lower extremity edema with blisters. His daughter called urgently this morning. Electronic Signature(s) Signed: 06/06/2020 4:48:02 PM By: Linton Ham MD Entered By: Linton Ham on 06/06/2020 15:56:29 [name] ([id number]) -------------------------------------------------------------------------------- HPI Details Patient Name: [name] Date of Service: 06/06/2020 3:15 PM Medical Record Number: [id number] Patient Account Number: [id number] Date of Birth/Sex: [birth year] (86 y.o. M) Treating RN: Cornell Barman Primary Care Provider: Philis Fendt Other Clinician: Referring Provider: Philis Fendt Treating Provider/Extender: Tito Dine in Treatment: 0 History of Present Illness HPI Description: 08/04/2019 on evaluation today patient presents for initial evaluation here in our clinic concerning issues that he has been having with lymphedema for quite some time. He has intermittent issues with blistering of his lower extremities for which she will go to the wound center and typically they will wrap him and then subsequently this gets better. That is been an ongoing issue for quite some time. Currently a couple weeks ago he had blisters pop up on the left leg and the right leg currently there is a small spot open on the right but nothing on the left. He does have a history of  coronary artery disease, COPD, diabetes mellitus type 2, and hypertension. Fortunately there is no signs of any cellulitis or active infection at this time. He has never had any lymphedema pumps and in fact did not even know what I was talking about when I mention this to him although he has been wrapped and had compression recommended for him long enough he may qualify for going ahead and initiating therapy with lymphedema pumps obviously this would have to be insurance approved. Fortunately there is no signs of any systemic infection either. No fevers, chills, nausea, vomiting, or diarrhea. He does have stage III lymphedema. 08/12/2019 on evaluation today patient appears to be doing well with regard to his lower extremities the edema is much better than it was during the last evaluation. Fortunately there is no signs of active infection which is great news. No fevers, chills, nausea, vomiting, or diarrhea. Overall he has been tolerating the wraps without any complication and the wound seems to be doing better as well. We are still working on gathering all the information for ordering the lymphedema pumps at this point. He does need a plan for ongoing compression as well. 10/30; the patient seems to be making nice progress. He only has a small open wound remaining on the right lateral calf. Everything on the left is healed. He has his bilateral external compression wraps. 08/30/2019 on evaluation today patient's right lower extremity actually appears to be doing quite well and in fact appears to be completely healed based on what I am seeing at this time. He shows no signs of active infection which is good news. He does have a blister over the left lower extremity which was previously healed out and he has a Velcro compression stocking on this area. With that being said he does not seem to be doing poorly at all at this point which is good news.  All in all however I do think we want to keep an eye on  him for another couple weeks before completely discharging him. 09/13/2019 on evaluation today patient appears to be doing well with regard to his right lower extremity although this is very swollen on the left lower extremity he actually has an opening where the blistered area we were observing last week has opened up into an actual wound. Unfortunately this just does not seem to be doing quite as well as we would have liked. I was really hoping that he would be doing just fine today and that we would be able to discharge him as healed today. Fortunately there is no signs of active infection at this point. The area of opening is very small he has very small blisters noted distal to the actual wound opening on the left lower extremity. 09/19/2019 upon evaluation today patient appears to be doing well with regard to his bilateral lower extremities and in fact he appears to be completely healed at this time which is great news. He was also contacted by the lymphedema pump company and he has been approved apparently for the pumps and is ready to proceed from there as well. They are very happy about this. READMISSION 06/06/2020 This is a patient that we had in clinic in the fall. He had I believe wounds on his bilateral lower extremities. We healed this up with compression. He had some form of compression stocking and there was suggestion that we referred him for lymphedema pumps. The patient is telling me that he had the lymphedema pumps but has not used them in 5 months. He now lives in an assisted living and may have been previously he was at home. 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 His daughter called urgently this morning stating that he had to be seen urgently because of blisters and edema in his lower extremities. I think she felt that that this was all lymphedema issues. We had a cancellation so he is here. The patient  came in from the facility but his daughter is not here. From review of the records that accompanied admitted. He has TED hose but has not been using any more advanced stocking or the lymphedema pumps. He is on Lasix 20 mg a day. Past medical history; I have looked over his past medical history. He has advanced prostate cancer with some degree of chronic renal insufficiency although his recent creatinine was 1.21. He has an IVC filter placed in January of this year secondary to inability to anticoagulate him because of recurrent hematuria and a history of DVT. He also has a history of diastolic congestive heart failure, COPD type 2 diabetes. ABIs in our clinic were 1.16 on the left and 1.11 on the Electronic Signature(s) Signed: 06/06/2020 4:48:02 PM By: Linton Ham MD Entered By: Linton Ham on 06/06/2020 16:00:15 [name], [name] ([id number]) -------------------------------------------------------------------------------- Physical Exam Details Patient Name: [name] Date of Service: 06/06/2020 3:15 PM Medical Record Number: [id number] Patient Account Number: [id number] Date of Birth/Sex: Oct 27, 1931 (86 y.o. M) Treating RN: Cornell Barman Primary Care Provider: Philis Fendt Other Clinician: Referring Provider: Philis Fendt Treating Provider/Extender: Tito Dine in Treatment: 0 Constitutional Patient is hypertensive.. Pulse regular and within target range for patient.. Temperature is normal and within the target range for the patient.Marland Kitchen appears in no distress. Respiratory Respiratory effort is easy and symmetric bilaterally. Rate is normal at rest  and on room air.. shalllow but otherwise clear. Cardiovascular popliteal pulses palpable. DP palp bilateral. PT not palpable. Patient has severe lymphedema in both lower legs. Particularly interesting is that on the distal third anteriorly he has localized areas of pitting edema which seems out of proportion to the diffuse  edema that we are used to seeing from his lymphedema.. Notes Wound exam; there is no open wound here. However he has blisters distally bilaterally in his anterior lower legs as well as a large area of pitting edema on top of the background lymphedema. Electronic Signature(s) Signed: 06/06/2020 4:48:02 PM By: Linton Ham MD Entered By: Linton Ham on 06/06/2020 16:06:52 [name] ([id number]) -------------------------------------------------------------------------------- Physician Orders Details Patient Name: [name] Date of Service: 06/06/2020 3:15 PM Medical Record Number: [id number] Patient Account Number: [id number] Date of Birth/Sex: 12-12-1931 (86 y.o. M) Treating RN: Cornell Barman Primary Care Provider: Philis Fendt Other Clinician: Referring Provider: Philis Fendt Treating Provider/Extender: Tito Dine in Treatment: 0 Verbal / Phone Orders: No Diagnosis Coding Follow-up Appointments o Return Appointment in 1 week. Edema Control o 3 Layer Compression System - Bilateral o Compression Pump: Use compression pump on left lower extremity for 60 minutes, twice daily. o Compression Pump: Use compression pump on right lower extremity for 60 minutes, twice daily. Electronic Signature(s) Signed: 06/06/2020 4:48:02 PM By: Linton Ham MD Signed: 06/07/2020 6:39:29 PM By: Gretta Cool BSN, RN, CWS, Kim RN, BSN Entered By: Gretta Cool, BSN, RN, CWS, Kim on 06/06/2020 15:55:57 [name], [name] ([id number]) -------------------------------------------------------------------------------- Problem List Details Patient Name: [name] Date of Service: 06/06/2020 3:15 PM Medical Record Number: [id number] Patient Account Number: [id number] Date of Birth/Sex: 1932-09-18 (86 y.o. M) Treating RN: Cornell Barman Primary Care Provider: Philis Fendt Other Clinician: Referring Provider: Philis Fendt Treating Provider/Extender: Tito Dine in  Treatment: 0 Active Problems ICD-10 Encounter Code Description Active Date MDM Diagnosis I89.0 Lymphedema, not elsewhere classified 06/06/2020 No Yes Z86.718 Personal history of other venous thrombosis and embolism 06/06/2020 No Yes Inactive Problems Resolved Problems Electronic Signature(s) Signed: 06/06/2020 4:48:02 PM By: Linton Ham MD Entered By: Linton Ham on 06/06/2020 15:55:54 [name] ([id number]) -------------------------------------------------------------------------------- Progress Note Details Patient Name: [name] Date of Service: 06/06/2020 3:15 PM Medical Record Number: [id number] Patient Account Number: [id number] Date of Birth/Sex: Feb 04, 1932 (86 y.o. M) Treating RN: Cornell Barman Primary Care Provider: Philis Fendt Other Clinician: Referring Provider: Philis Fendt Treating Provider/Extender: Tito Dine in Treatment: 0 Subjective Chief Complaint Information obtained from Patient Bilateral LE Lymphedema with left leg ulcer 06/06/2020; patient is here for review of bilateral lower extremity edema with blisters. His daughter called urgently this morning. History of Present Illness (HPI) 08/04/2019 on evaluation today patient presents for initial evaluation here in our clinic concerning issues that he has been having with lymphedema for quite some time. He has intermittent issues with blistering of his lower extremities for which she will go to the wound center and typically they will wrap him and then subsequently this gets better. That is been an ongoing issue for quite some time. Currently a couple weeks ago he had blisters pop up on the left leg and the right leg currently there is a small spot open on the right but nothing on the left. He does have a history of coronary artery disease, COPD, diabetes mellitus type 2, and hypertension. Fortunately there is no signs of any cellulitis or active infection at this time. He has never  had any lymphedema pumps and in fact did not  even know what I was talking about when I mention this to him although he has been wrapped and had compression recommended for him long enough he may qualify for going ahead and initiating therapy with lymphedema pumps obviously this would have to be insurance approved. Fortunately there is no signs of any systemic infection either. No fevers, chills, nausea, vomiting, or diarrhea. He does have stage III lymphedema. 08/12/2019 on evaluation today patient appears to be doing well with regard to his lower extremities the edema is much better than it was during the last evaluation. Fortunately there is no signs of active infection which is great news. No fevers, chills, nausea, vomiting, or diarrhea. Overall he has been tolerating the wraps without any complication and the wound seems to be doing better as well. We are still working on gathering all the information for ordering the lymphedema pumps at this point. He does need a plan for ongoing compression as well. 10/30; the patient seems to be making nice progress. He only has a small open wound remaining on the right lateral calf. Everything on the left is healed. He has his bilateral external compression wraps. 08/30/2019 on evaluation today patient's right lower extremity actually appears to be doing quite well and in fact appears to be completely healed based on what I am seeing at this time. He shows no signs of active infection which is good news. He does have a blister over the left lower extremity which was previously healed out and he has a Velcro compression stocking on this area. With that being said he does not seem to be doing poorly at all at this point which is good news. All in all however I do think we want to keep an eye on him for another couple weeks before completely discharging him. 09/13/2019 on evaluation today patient appears to be doing well with regard to his right lower extremity  although this is very swollen on the left lower extremity he actually has an opening where the blistered area we were observing last week has opened up into an actual wound. Unfortunately this just does not seem to be doing quite as well as we would have liked. I was really hoping that he would be doing just fine today and that we would be able to discharge him as healed today. Fortunately there is no signs of active infection at this point. The area of opening is very small he has very small blisters noted distal to the actual wound opening on the left lower extremity. 09/19/2019 upon evaluation today patient appears to be doing well with regard to his bilateral lower extremities and in fact he appears to be completely healed at this time which is great news. He was also contacted by the lymphedema pump company and he has been approved apparently for the pumps and is ready to proceed from there as well. They are very happy about this. READMISSION 06/06/2020 This is a patient that we had in clinic in the fall. He had I believe wounds on his bilateral lower extremities. We healed this up with compression. He had some form of compression stocking and there was suggestion that we referred him for lymphedema pumps. The patient is telling me that he had the lymphedema pumps but has not used them in 5 months. He now lives in an assisted living and may have been previously he was at home. He has advanced prostate cancer with widespread bony and I believe they have an pleural metastasis  on the right. His daughter called urgently this morning stating that he had to be seen urgently because of blisters and edema in his lower extremities. I think she felt that that this was all lymphedema issues. We had a cancellation so he is here. The patient came in from the facility but his daughter is not here. From review of the records that accompanied admitted. He has TED hose but has not been using any more advanced  stocking or the lymphedema pumps. He is on Lasix 20 mg a day. Past medical history; I have looked over his past medical history. He has advanced prostate cancer with some degree of chronic renal insufficiency although his recent creatinine was 1.21. He has an IVC filter placed in January of this year secondary to inability to anticoagulate him because of recurrent hematuria and a history of DVT. He also has a history of diastolic congestive heart failure, COPD type 2 diabetes. ABIs in our clinic were 1.16 on the left and 1.11 on the Patient History Information obtained from Patient. Allergies metformin (Reaction: diarrhea) Topel, Bethany ([id number]) Family History Diabetes - Child, No family history of Cancer, Heart Disease, Hereditary Spherocytosis, Hypertension, Kidney Disease, Lung Disease, Seizures, Stroke, Thyroid Problems, Tuberculosis. Social History Former smoker - 2000, Marital Status - Widowed, Alcohol Use - Never - quit 2000, Drug Use - No History, Caffeine Use - Daily. Medical History Eyes Patient has history of Glaucoma Denies history of Cataracts, Optic Neuritis Ear/Nose/Mouth/Throat Denies history of Chronic sinus problems/congestion, Middle ear problems Hematologic/Lymphatic Denies history of Anemia, Hemophilia, Human Immunodeficiency Virus, Lymphedema, Sickle Cell Disease Respiratory Patient has history of Chronic Obstructive Pulmonary Disease (COPD) Denies history of Aspiration, Asthma, Pneumothorax, Sleep Apnea, Tuberculosis Cardiovascular Patient has history of Coronary Artery Disease, Deep Vein Thrombosis, Hypertension, Myocardial Infarction Denies history of Angina, Arrhythmia, Congestive Heart Failure, Hypotension, Peripheral Arterial Disease, Peripheral Venous Disease, Phlebitis, Vasculitis Gastrointestinal Denies history of Cirrhosis , Colitis, Crohn s, Hepatitis A, Hepatitis B, Hepatitis C Endocrine Patient has history of Type II Diabetes Denies history  of Type I Diabetes Genitourinary Patient has history of End Stage Renal Disease - CKD Immunological Denies history of Lupus Erythematosus, Raynaud s, Scleroderma Integumentary (Skin) Denies history of History of Burn, History of pressure wounds Musculoskeletal Patient has history of Gout, Osteoarthritis Denies history of Rheumatoid Arthritis, Osteomyelitis Neurologic Patient has history of Neuropathy Denies history of Dementia, Quadriplegia, Paraplegia, Seizure Disorder Oncologic Patient has history of Received Chemotherapy - oral Denies history of Received Radiation Psychiatric Denies history of Anorexia/bulimia, Confinement Anxiety Hospitalization/Surgery History - appendectomy. - belpharoptosis repair. - eye surgery. Medical And Surgical History Notes Cardiovascular HLD, venous stasis Integumentary (Skin) history of venous ulcers Review of Systems (ROS) Constitutional Symptoms (General Health) Denies complaints or symptoms of Fatigue, Fever, Chills, Marked Weight Change. Eyes Denies complaints or symptoms of Dry Eyes, Vision Changes, Glasses / Contacts. Ear/Nose/Mouth/Throat Denies complaints or symptoms of Difficult clearing ears, Sinusitis. Hematologic/Lymphatic Denies complaints or symptoms of Bleeding / Clotting Disorders, Human Immunodeficiency Virus. Respiratory Denies complaints or symptoms of Chronic or frequent coughs, Shortness of Breath. Gastrointestinal Complains or has symptoms of Frequent diarrhea. Denies complaints or symptoms of Nausea, Vomiting. Endocrine Denies complaints or symptoms of Hepatitis, Thyroid disease, Polydypsia (Excessive Thirst). Genitourinary Denies complaints or symptoms of Kidney failure/ Dialysis, Incontinence/dribbling. Immunological Denies complaints or symptoms of Hives, Itching. Integumentary (Skin) blisters and edema to bilateral lower extremities Musculoskeletal Complains or has symptoms of Muscle Weakness. Denies  complaints or symptoms of Muscle Pain. [name], [name] ([id number])  Neurologic Denies complaints or symptoms of Numbness/parasthesias. Psychiatric Denies complaints or symptoms of Anxiety, Claustrophobia. Objective Constitutional Patient is hypertensive.. Pulse regular and within target range for patient.. Temperature is normal and within the target range for the patient.Marland Kitchen appears in no distress. Vitals Time Taken: 2:55 PM, Height: 71 in, Source: Stated, Weight: 254 lbs, Source: Stated, BMI: 35.4, Temperature: 98.4 F, Pulse: 66 bpm, Respiratory Rate: 18 breaths/min, Blood Pressure: 162/71 mmHg. Respiratory Respiratory effort is easy and symmetric bilaterally. Rate is normal at rest and on room air.. shalllow but otherwise clear. Cardiovascular popliteal pulses palpable. DP palp bilateral. PT not palpable. Patient has severe lymphedema in both lower legs. Particularly interesting is that on the distal third anteriorly he has localized areas of pitting edema which seems out of proportion to the diffuse edema that we are used to seeing from his lymphedema.. General Notes: Wound exam; there is no open wound here. However he has blisters distally bilaterally in his anterior lower legs as well as a large area of pitting edema on top of the background lymphedema. Assessment Active Problems ICD-10 Lymphedema, not elsewhere classified Personal history of other venous thrombosis and embolism Plan Follow-up Appointments: Return Appointment in 1 week. Edema Control: 3 Layer Compression System - Bilateral Compression Pump: Use compression pump on left lower extremity for 60 minutes, twice daily. Compression Pump: Use compression pump on right lower extremity for 60 minutes, twice daily. 1. I am uncomfortable leaving this man on treated today even though he has no open wounds. Without treatment I think his skin could easily breakdown in the large open wounds anteriorly. 2. Because of this I am  going to go ahead and put him in 3 layer compression I have also asked him to increase his furosemide from 20 to 40 mg a day. 3. I see no evidence of congestive heart failure. Last echocardiogram was done on 10/15/2019 showed grade 1 diastolic dysfunction. I see none of that at the bedside currently 4. The patient does have a history of DVT although I could not be more precise. He does have however a venous filter placed in January of this year. I do not think he has any evidence of active DVT at the bedside. 5. At 1 point the patient had compression pumps. He is now in an assisted living. If he can resurrect the compression pumps we might be able to control the edema in his legs although this might not be easy to do in an assisted living. [name], [name] ([id number]) I spent 35 minutes in review of this patient's past medical history, face-to-face evaluation and preparation of this record. We will see him back in 1 week. We are going to try to leave the compression wraps on all week Electronic Signature(s) Signed: 06/06/2020 4:48:02 PM By: Linton Ham MD Entered By: Linton Ham on 06/06/2020 16:09:48 [name], [name] ([id number]) -------------------------------------------------------------------------------- ROS/PFSH Details Patient Name: [name] Date of Service: 06/06/2020 3:15 PM Medical Record Number: [id number] Patient Account Number: [id number] Date of Birth/Sex: 1932/04/07 (86 y.o. M) Treating RN: Grover Canavan Primary Care Provider: Philis Fendt Other Clinician: Referring Provider: Philis Fendt Treating Provider/Extender: Tito Dine in Treatment: 0 Information Obtained From Patient Constitutional Symptoms (General Health) Complaints and Symptoms: Negative for: Fatigue; Fever; Chills; Marked Weight Change Eyes Complaints and Symptoms: Negative for: Dry Eyes; Vision Changes; Glasses / Contacts Medical History: Positive for: Glaucoma Negative  for: Cataracts; Optic Neuritis Ear/Nose/Mouth/Throat Complaints and Symptoms: Negative for: Difficult clearing ears; Sinusitis Medical History: Negative for: Chronic  sinus problems/congestion; Middle ear problems Hematologic/Lymphatic Complaints and Symptoms: Negative for: Bleeding / Clotting Disorders; Human Immunodeficiency Virus Medical History: Negative for: Anemia; Hemophilia; Human Immunodeficiency Virus; Lymphedema; Sickle Cell Disease Respiratory Complaints and Symptoms: Negative for: Chronic or frequent coughs; Shortness of Breath Medical History: Positive for: Chronic Obstructive Pulmonary Disease (COPD) Negative for: Aspiration; Asthma; Pneumothorax; Sleep Apnea; Tuberculosis Gastrointestinal Complaints and Symptoms: Positive for: Frequent diarrhea Negative for: Nausea; Vomiting Medical History: Negative for: Cirrhosis ; Colitis; Crohnos; Hepatitis A; Hepatitis B; Hepatitis C Endocrine Complaints and Symptoms: Negative for: Hepatitis; Thyroid disease; Polydypsia (Excessive Thirst) Medical History: Positive for: Type II Diabetes Negative for: Type I Diabetes Time with diabetes: 5 years Treated with: Insulin [name], [name] ([id number]) Blood sugar tested every day: Yes Tested : TID Genitourinary Complaints and Symptoms: Negative for: Kidney failure/ Dialysis; Incontinence/dribbling Medical History: Positive for: End Stage Renal Disease - CKD Immunological Complaints and Symptoms: Negative for: Hives; Itching Medical History: Negative for: Lupus Erythematosus; Raynaudos; Scleroderma Musculoskeletal Complaints and Symptoms: Positive for: Muscle Weakness Negative for: Muscle Pain Medical History: Positive for: Gout; Osteoarthritis Negative for: Rheumatoid Arthritis; Osteomyelitis Neurologic Complaints and Symptoms: Negative for: Numbness/parasthesias Medical History: Positive for: Neuropathy Negative for: Dementia; Quadriplegia; Paraplegia; Seizure  Disorder Psychiatric Complaints and Symptoms: Negative for: Anxiety; Claustrophobia Medical History: Negative for: Anorexia/bulimia; Confinement Anxiety Cardiovascular Medical History: Positive for: Coronary Artery Disease; Deep Vein Thrombosis; Hypertension; Myocardial Infarction Negative for: Angina; Arrhythmia; Congestive Heart Failure; Hypotension; Peripheral Arterial Disease; Peripheral Venous Disease; Phlebitis; Vasculitis Past Medical History Notes: HLD, venous stasis Integumentary (Skin) Complaints and Symptoms: Review of System Notes: blisters and edema to bilateral lower extremities Medical History: Negative for: History of Burn; History of pressure wounds Past Medical History Notes: history of venous ulcers Oncologic Medical History: Positive for: Received Chemotherapy - oral Negative for: Received Radiation [name], [name] ([id number]) HBO Extended History Items Eyes: Glaucoma Immunizations Pneumococcal Vaccine: Received Pneumococcal Vaccination: Yes Immunization Notes: up to date Implantable Devices None Hospitalization / Surgery History Type of Hospitalization/Surgery appendectomy belpharoptosis repair eye surgery Family and Social History Cancer: No; Diabetes: Yes - Child; Heart Disease: No; Hereditary Spherocytosis: No; Hypertension: No; Kidney Disease: No; Lung Disease: No; Seizures: No; Stroke: No; Thyroid Problems: No; Tuberculosis: No; Former smoker - 2000; Marital Status - Widowed; Alcohol Use: Never - quit 2000; Drug Use: No History; Caffeine Use: Daily; Financial Concerns: No; Food, Clothing or Shelter Needs: No; Support System Lacking: No; Transportation Concerns: No Electronic Signature(s) Signed: 06/06/2020 4:48:02 PM By: Linton Ham MD Signed: 06/06/2020 4:57:56 PM By: Grover Canavan Entered By: Grover Canavan on 06/06/2020 15:11:58 [name], [name]  ([id number]) -------------------------------------------------------------------------------- SuperBill Details Patient Name: [name] Date of Service: 06/06/2020 Medical Record Number: [id number] Patient Account Number: [id number] Date of Birth/Sex: 1932-03-23 (86 y.o. M) Treating RN: Cornell Barman Primary Care Provider: Philis Fendt Other Clinician: Referring Provider: Philis Fendt Treating Provider/Extender: Tito Dine in Treatment: 0 Diagnosis Coding ICD-10 Codes Code Description I89.0 Lymphedema, not elsewhere classified Z86.718 Personal history of other venous thrombosis and embolism Facility Procedures CPT4: Description Modifier Quantity Code 96789381 99213 - WOUND CARE VISIT-LEV 3 EST PT 1 CPT4: [id number] BILATERAL: Application of multi-layer venous compression system; leg (below knee), including 1 ankle and foot. Physician Procedures CPT4 Code: 8242353 Description: 61443 - WC PHYS LEVEL 4 - EST PT Modifier: Quantity: 1 CPT4 Code: Description: ICD-10 Diagnosis Description I89.0 Lymphedema, not elsewhere classified Z86.718 Personal history of other venous thrombosis and embolism Modifier: Quantity: Electronic Signature(s) Signed: 06/06/2020 6:20:01 PM By: Gretta Cool, BSN, RN, CWS,  Maudie Mercury RN, BSN Signed: 06/07/2020 7:38:30 AM By: Linton Ham MD Previous Signature: 06/06/2020 4:48:02 PM Version By: Linton Ham MD Entered By: Gretta Cool, BSN, RN, CWS, Kim on 06/06/2020 18:20:01

## 2020-06-13 ENCOUNTER — Other Ambulatory Visit: Payer: Self-pay

## 2020-06-13 ENCOUNTER — Encounter: Payer: Medicare Other | Admitting: Internal Medicine

## 2020-06-13 DIAGNOSIS — E11622 Type 2 diabetes mellitus with other skin ulcer: Secondary | ICD-10-CM | POA: Diagnosis not present

## 2020-06-14 NOTE — Progress Notes (Signed)
JEFFERIE, HOLSTON ([id number]) Visit Report for 06/13/2020 Arrival Information Details Patient Name: [name], [name] Date of Service: 06/13/2020 2:30 PM Medical Record Number: [id number] Patient Account Number: [id number] Date of Birth/Sex: 03-27-1932 (86 y.o. M) Treating RN: [name] Primary Care [name]: [name] Other Clinician: Referring [name]: [name] Treating Baleria Wyman/Extender: [name] in Treatment: 1 Visit Information History Since Last Visit All ordered tests and consults were completed: No Patient Arrived: Wheel Chair Added or deleted any medications: No Arrival Time: 15:49 Any new allergies or adverse reactions: No Accompanied By: facility staff Had a fall or experienced change in No Transfer Assistance: Manual activities of daily living that may affect Patient Identification Verified: Yes risk of falls: Secondary Verification Process Completed: Yes Signs or symptoms of abuse/neglect since last visito No Patient Requires Transmission-Based Precautions: No Hospitalized since last visit: No Patient Has Alerts: No Implantable device outside of the clinic excluding No cellular tissue based products placed in the center since last visit: Pain Present Now: Yes Electronic Signature(s) Signed: 06/13/2020 4:50:41 PM By: [name] Entered By: [name] on 06/13/2020 15:50:29 [name], [name] ([id number]) -------------------------------------------------------------------------------- Compression Therapy Details Patient Name: [name] Date of Service: 06/13/2020 2:30 PM Medical Record Number: [id number] Patient Account Number: [id number] Date of Birth/Sex: April 24, 1932 (86 y.o. M) Treating RN: [name] Primary Care [name]: [name] Other Clinician: Referring [name]: [name] Treating [name]/Extender: [name] in Treatment: 1 Compression Therapy Performed for Wound Assessment: NonWound  Condition Lymphedema - Bilateral Leg Performed By: Clinician Brandon Barman, RN Compression Type: Four Layer Pre Treatment ABI: 1.1 Post Procedure Diagnosis Same as Pre-procedure Electronic Signature(s) Signed: 06/13/2020 5:45:00 PM By: [name], BSN, RN, CWS, Kim RN, BSN Entered By: [name], BSN, RN, CWS, [name] on 06/13/2020 16:00:29 [name] ([id number]) -------------------------------------------------------------------------------- Encounter Discharge Information Details Patient Name: [name] Date of Service: 06/13/2020 2:30 PM Medical Record Number: [id number] Patient Account Number: [id number] Date of Birth/Sex: [birth year] (86 y.o. M) Treating RN: [name] Primary Care [name]: [name] Other Clinician: Referring [name]: [name] Treating [name]/Extender: [name] in Treatment: 1 Encounter Discharge Information Items Discharge Condition: Stable Ambulatory Status: Ambulatory Discharge Destination: Home Transportation: Private Auto Accompanied By: daughter Schedule Follow-up Appointment: Yes Clinical Summary of Care: Electronic Signature(s) Signed: 06/13/2020 5:45:00 PM By: [name], BSN, RN, CWS, Kim RN, BSN Entered By: [name], BSN, RN, CWS, [name] on 06/13/2020 16:04:11 [name] ([id number]) -------------------------------------------------------------------------------- Lower Extremity Assessment Details Patient Name: [name] Date of Service: 06/13/2020 2:30 PM Medical Record Number: [id number] Patient Account Number: [id number] Date of Birth/Sex: 02/21/1932 (86 y.o. M) Treating RN: [name] Primary Care [name]: [name] Other Clinician: Referring [name]: [name] Treating [name]/Extender: [name] Weeks in Treatment: 1 Edema Assessment Assessed: [Left: Yes] [Right: Yes] Edema: [Left: Yes] [Right: Yes] Calf Left: Right: Point of Measurement: 33 cm From Medial Instep 43 cm 44 cm Ankle Left:  Right: Point of Measurement: 12 cm From Medial Instep 39 cm 38 cm Vascular Assessment Pulses: Dorsalis Pedis Palpable: [Left:Yes] [Right:Yes] Posterior Tibial Palpable: [Left:Yes] [Right:Yes] Electronic Signature(s) Signed: 06/13/2020 4:50:41 PM By: [name] Signed: 06/13/2020 5:45:00 PM By: [name], BSN, RN, CWS, Kim RN, BSN Entered By: [name] on 06/13/2020 15:53:14 [name], [name] ([id number]) -------------------------------------------------------------------------------- Multi Wound Chart Details Patient Name: [name] Date of Service: 06/13/2020 2:30 PM Medical Record Number: [id number] Patient Account Number: [id number] Date of Birth/Sex: 06-12-1932 (86 y.o. M) Treating RN: [name] Primary Care [name]: [name] Other Clinician: Referring [name]: [name] Treating  [name]/Extender: [name] Weeks in Treatment: 1 Vital Signs Height(in): 71 Pulse(bpm): 68 Weight(lbs): 254 Blood Pressure(mmHg): 160/59 Body Mass Index(BMI): 35 Temperature(F): 98.9 Respiratory Rate(breaths/min): 20 Wound Assessments Treatment Notes Electronic Signature(s) Signed: 06/13/2020 5:45:00 PM By: [name], BSN, RN, CWS, Kim RN, BSN Entered By: [name], BSN, RN, CWS, [name] on 06/13/2020 15:59:24 [name], [name] ([id number]) -------------------------------------------------------------------------------- Multi-Disciplinary Care Plan Details Patient Name: [name] Date of Service: 06/13/2020 2:30 PM Medical Record Number: [id number] Patient Account Number: [id number] Date of Birth/Sex: 12/17/1931 (86 y.o. M) Treating RN: [name] Primary Care [name]: [name] Other Clinician: Referring [name]: [name] Treating [name]/Extender: [name] in Treatment: 1 Active Inactive Orientation to the Wound Care Program Nursing Diagnoses: Knowledge deficit related to the wound healing center program Goals: Patient/caregiver  will verbalize understanding of the [place] Program Date Initiated: 06/06/2020 Target Resolution Date: 06/06/2020 Goal Status: Active Interventions: Provide education on orientation to the wound center Notes: Venous Leg Ulcer Nursing Diagnoses: Knowledge deficit related to disease process and management Goals: Patient will maintain optimal edema control Date Initiated: 06/06/2020 Target Resolution Date: 06/20/2020 Goal Status: Active Interventions: Assess peripheral edema status every visit. Treatment Activities: Therapeutic compression applied : 06/06/2020 Notes: Wound/Skin Impairment Nursing Diagnoses: Impaired tissue integrity Goals: Patient/caregiver will verbalize understanding of skin care regimen Date Initiated: 06/06/2020 Target Resolution Date: 06/20/2020 Goal Status: Active Interventions: Assess patient/caregiver ability to perform ulcer/skin care regimen upon admission and as needed Treatment Activities: Skin care regimen initiated : 06/06/2020 Topical wound management initiated : 06/06/2020 Notes: Electronic Signature(s) Signed: 06/13/2020 5:45:00 PM By: [name], BSN, RN, CWS, Kim RN, BSN [name], [name] ([id number]) Entered By: [name], BSN, RN, CWS, [name] on 06/13/2020 15:58:59 [name] ([id number]) -------------------------------------------------------------------------------- Non-Wound Condition Assessment Details Patient Name: [name] Date of Service: 06/13/2020 2:30 PM Medical Record Number: [id number] Patient Account Number: [id number] Date of Birth/Sex: 25-Dec-1931 (86 y.o. M) Treating RN: [name] Primary Care Chaquana Nichols: [name] Other Clinician: Referring Sheana Bir: [name] Treating Isack Lavalley/Extender: [name] Weeks in Treatment: 1 Non-Wound Condition: Condition: Lymphedema Location: Leg Side: Bilateral Photos Electronic Signature(s) Signed: 06/13/2020 4:50:41 PM By: [name] Signed: 06/13/2020 5:45:00  PM By: [name], BSN, RN, CWS, Kim RN, BSN Entered By: [name] on 06/13/2020 15:54:37 [name], [name] ([id number]) -------------------------------------------------------------------------------- Pain Assessment Details Patient Name: [name] Date of Service: 06/13/2020 2:30 PM Medical Record Number: [id number] Patient Account Number: [id number] Date of Birth/Sex: 1932-04-23 (86 y.o. M) Treating RN: [name] Primary Care Thora Scherman: [name] Other Clinician: Referring Lateya Dauria: [name] Treating Maddock Finigan/Extender: [name] in Treatment: 1 Active Problems Location of Pain Severity and Description of Pain Patient Has Paino Yes Site Locations Pain Location: Generalized Pain With Dressing Change: No Duration of the Pain. Constant / Intermittento Constant Rate the pain. Current Pain Level: 8 Worst Pain Level: 10 Least Pain Level: 4 Tolerable Pain Level: 4 Character of Pain Describe the Pain: Aching, Tender, Throbbing Pain Management and Medication Current Pain Management: Goals for Pain Management states pain has been worse last 24 hours in both legs Electronic Signature(s) Signed: 06/13/2020 4:50:41 PM By: [name] Signed: 06/13/2020 5:45:00 PM By: [name], BSN, RN, CWS, Kim RN, BSN Entered By: [name] on 06/13/2020 15:52:05 [name], [name] ([id number]) -------------------------------------------------------------------------------- Patient/Caregiver Education Details Patient Name: [name] Date of Service: 06/13/2020 2:30 PM Medical Record Number: [id number] Patient Account Number: [id number] Date of Birth/Gender: 1932-02-10 (86 y.o. M) Treating RN: [name] Primary Care Physician: [name] Other Clinician: Referring Physician: Philis [name] Treating  Physician/Extender: [name] in Treatment: 1 Education Assessment Education Provided To: Patient Education Topics  Provided Venous: Handouts: Controlling Swelling with Multilayered Compression Wraps Methods: Demonstration, Explain/Verbal Responses: State content correctly Welcome To [facility]: Electronic Signature(s) Signed: 06/13/2020 5:45:00 PM By: [name], BSN, RN, CWS, Kim RN, BSN Entered By: [name], BSN, RN, CWS, [name] on 06/13/2020 16:02:40 [name] ([id number]) -------------------------------------------------------------------------------- [place] Details Patient Name: [name] Date of Service: 06/13/2020 2:30 PM Medical Record Number: [id number] Patient Account Number: [id number] Date of Birth/Sex: 05-30-1932 (86 y.o. M) Treating RN: [name] Primary Care Ziyad Dyar: [name] Other Clinician: Referring Lemmie Steinhaus: [name] Treating Maryan Sivak/Extender: [name] in Treatment: 1 Vital Signs Time Taken: 03:25 Temperature (F): 98.9 Height (in): 71 Pulse (bpm): 68 Weight (lbs): 254 Respiratory Rate (breaths/min): 20 Body Mass Index (BMI): 35.4 Blood Pressure (mmHg): 160/59 Reference Range: 80 - 120 mg / dl Electronic Signature(s) Signed: 06/13/2020 4:50:41 PM By: [name] Entered By: [name] on 06/13/2020 15:50:54

## 2020-06-14 NOTE — Progress Notes (Signed)
SASAN, WILKIE ([id number]) Visit Report for 06/13/2020 HPI Details Patient Name: DOCTOR, SHEAHAN Date of Service: 06/13/2020 2:30 PM Medical Record Number: [id number] Patient Account Number: [id number] Date of Birth/Sex: 1932-01-04 (86 y.o. M) Treating RN: Cornell Barman Primary Care Provider: Philis Fendt Other Clinician: Referring Provider: Philis Fendt Treating Provider/Extender: Tito Dine in Treatment: 1 History of Present Illness HPI Description: 2510/15/2020 on evaluation today patient presents for initial evaluation here in our clinic concerning issues that he has been having with lymphedema for quite some time. He has intermittent issues with blistering of his lower extremities for which she will go to the wound center and typically they will wrap him and then subsequently this gets better. That is been an ongoing issue for quite some time. Currently a couple weeks ago he had blisters pop up on the left leg and the right leg currently there is a small spot open on the right but nothing on the left. He does have a history of coronary artery disease, COPD, diabetes mellitus type 2, and hypertension. Fortunately there is no signs of any cellulitis or active infection at this time. He has never had any lymphedema pumps and in fact did not even know what I was talking about when I mention this to him although he has been wrapped and had compression recommended for him long enough he may qualify for going ahead and initiating therapy with lymphedema pumps obviously this would have to be insurance approved. Fortunately there is no signs of any systemic infection either. No fevers, chills, nausea, vomiting, or diarrhea. He does have stage III lymphedema. 08/12/2019 on evaluation today patient appears to be doing well with regard to his lower extremities the edema is much better than it was during the last evaluation. Fortunately there is no signs of active infection which is great  news. No fevers, chills, nausea, vomiting, or diarrhea. Overall he has been tolerating the wraps without any complication and the wound seems to be doing better as well. We are still working on gathering all the information for ordering the lymphedema pumps at this point. He does need a plan for ongoing compression as well. 10/30; the patient seems to be making nice progress. He only has a small open wound remaining on the right lateral calf. Everything on the left is healed. He has his bilateral external compression wraps. 08/30/2019 on evaluation today patient's right lower extremity actually appears to be doing quite well and in fact appears to be completely healed based on what I am seeing at this time. He shows no signs of active infection which is good news. He does have a blister over the left lower extremity which was previously healed out and he has a Velcro compression stocking on this area. With that being said he does not seem to be doing poorly at all at this point which is good news. All in all however I do think we want to keep an eye on him for another couple weeks before completely discharging him. 09/13/2019 on evaluation today patient appears to be doing well with regard to his right lower extremity although this is very swollen on the left lower extremity he actually has an opening where the blistered area we were observing last week has opened up into an actual wound. Unfortunately this just does not seem to be doing quite as well as we would have liked. I was really hoping that he would be doing just fine today and that we would be able  to discharge him as healed today. Fortunately there is no signs of active infection at this point. The area of opening is very small he has very small blisters noted distal to the actual wound opening on the left lower extremity. 09/19/2019 upon evaluation today patient appears to be doing well with regard to his bilateral lower extremities and in  fact he appears to be completely healed at this time which is great news. He was also contacted by the lymphedema pump company and he has been approved apparently for the pumps and is ready to proceed from there as well. They are very happy about this. READMISSION 06/06/2020 This is a patient that we had in clinic in the fall. He had I believe wounds on his bilateral lower extremities. We healed this up with compression. He had some form of compression stocking and there was suggestion that we referred him for lymphedema pumps. The patient is telling me that he had the lymphedema pumps but has not used them in 5 months. He now lives in an assisted living and may have been previously he was at home. 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 His daughter called urgently this morning stating that he had to be seen urgently because of blisters and edema in his lower extremities. I think she felt that that this was all lymphedema issues. We had a cancellation so he is here. The patient came in from the facility but his daughter is not here. From review of the records that accompanied admitted. He has TED hose but has not been using any more advanced stocking or the lymphedema pumps. He is on Lasix 20 mg a day. Past medical history; I have looked over his past medical history. He has advanced prostate cancer with some degree of chronic renal insufficiency although his recent creatinine was 1.21. He has an IVC filter placed in January of this year secondary to inability to anticoagulate him because of recurrent hematuria and a history of DVT. He also has a history of diastolic congestive heart failure, COPD type 2 diabetes. ABIs in our clinic were 1.16 on the left and 1.11 on the 8/25; the patient with severe bilateral lymphedema. He also has a history of DVT and apparently has an IVC filter. He came in the clinic last week with severe  bilateral lower extremity edema. So much so that I elected to put him in bilateral 3 layer compression. I was worried that things were in a state of imminent breakdown. He has compression pumps but is now in an assisted living he is not using these. I am not sure if he is keeping his legs elevated. When he came in here he was not using compression stockings Electronic Signature(s) Signed: 06/14/2020 4:51:35 PM By: Linton Ham MD [place], Jodi Mourning ([id number]) Entered By: Linton Ham on 06/13/2020 16:22:51 TOBI, LEINWEBER ([id number]) -------------------------------------------------------------------------------- Physical Exam Details Patient Name: [name] Date of Service: 06/13/2020 2:30 PM Medical Record Number: [id number] Patient Account Number: [id number] Date of Birth/Sex: [birth year] (86 y.o. M) Treating RN: Cornell Barman Primary Care Provider: Philis Fendt Other Clinician: Referring Provider: Philis Fendt Treating Provider/Extender: Tito Dine in Treatment: 1 Constitutional Patient is hypertensive.. Pulse regular and within target range for patient.Marland Kitchen Respirations regular, non-labored and within target range.. Temperature is normal and within the target range for the patient.Marland Kitchen appears in no distress. Respiratory Respiratory effort is easy and symmetric bilaterally. Rate is normal at  rest and on room air.. Bilateral breath sounds are clear and equal in all lobes with no wheezes, rales or rhonchi.. Cardiovascular Heart rhythm and rate regular, without murmur or gallop.. No evidence of CHF. I am able to feel pedal pulses this week bilaterally. Edema control is better in the lower extremities but still present. I do not think there is much edema in his thighs some posteriorly but this is not an obvious acute issue.Marland Kitchen Notes Wound exam; the patient continues to have no open wound but he does have a blister on the right anterior lower leg distally. There is  nothing that seems acute here his edema control is better but certainly not totally resolved. Electronic Signature(s) Signed: 06/14/2020 4:51:35 PM By: Linton Ham MD Entered By: Linton Ham on 06/13/2020 16:24:31 KILE, KABLER ([id number]) -------------------------------------------------------------------------------- Physician Orders Details Patient Name: [name] Date of Service: 06/13/2020 2:30 PM Medical Record Number: [id number] Patient Account Number: [id number] Date of Birth/Sex: 04/20/1932 (86 y.o. M) Treating RN: Cornell Barman Primary Care Provider: Philis Fendt Other Clinician: Referring Provider: Philis Fendt Treating Provider/Extender: Tito Dine in Treatment: 1 Verbal / Phone Orders: No Diagnosis Coding Follow-up Appointments o Return Appointment in 1 week. Edema Control o 4 Layer Compression System - Bilateral o Compression Pump: Use compression pump on left lower extremity for 60 minutes, twice daily. o Compression Pump: Use compression pump on right lower extremity for 60 minutes, twice daily. Electronic Signature(s) Signed: 06/13/2020 5:45:00 PM By: Gretta Cool, BSN, RN, CWS, Kim RN, BSN Signed: 06/14/2020 4:51:35 PM By: Linton Ham MD Entered By: Gretta Cool, BSN, RN, CWS, Kim on 06/13/2020 16:01:10 TREVONE, PRESTWOOD ([id number]) -------------------------------------------------------------------------------- Problem List Details Patient Name: [name] Date of Service: 06/13/2020 2:30 PM Medical Record Number: [id number] Patient Account Number: [id number] Date of Birth/Sex: [birth year] (86 y.o. M) Treating RN: Cornell Barman Primary Care Provider: Philis Fendt Other Clinician: Referring Provider: Philis Fendt Treating Provider/Extender: Tito Dine in Treatment: 1 Active Problems ICD-10 Encounter Code Description Active Date MDM Diagnosis I89.0 Lymphedema, not elsewhere classified 06/06/2020 No Yes Z86.718  Personal history of other venous thrombosis and embolism 06/06/2020 No Yes Inactive Problems Resolved Problems Electronic Signature(s) Signed: 06/14/2020 4:51:35 PM By: Linton Ham MD Entered By: Linton Ham on 06/13/2020 16:20:14 [name] ([id number]) -------------------------------------------------------------------------------- Progress Note Details Patient Name: [name] Date of Service: 06/13/2020 2:30 PM Medical Record Number: [id number] Patient Account Number: [id number] Date of Birth/Sex: Nov 18, 1931 (86 y.o. M) Treating RN: Cornell Barman Primary Care Provider: Philis Fendt Other Clinician: Referring Provider: Philis Fendt Treating Provider/Extender: Tito Dine in Treatment: 1 Subjective History of Present Illness (HPI) 2510/15/2020 on evaluation today patient presents for initial evaluation here in our clinic concerning issues that he has been having with lymphedema for quite some time. He has intermittent issues with blistering of his lower extremities for which she will go to the wound center and typically they will wrap him and then subsequently this gets better. That is been an ongoing issue for quite some time. Currently a couple weeks ago he had blisters pop up on the left leg and the right leg currently there is a small spot open on the right but nothing on the left. He does have a history of coronary artery disease, COPD, diabetes mellitus type 2, and hypertension. Fortunately there is no signs of any cellulitis or active infection at this time. He has never had any lymphedema pumps and in fact did not even know what I was talking about when I mention  this to him although he has been wrapped and had compression recommended for him long enough he may qualify for going ahead and initiating therapy with lymphedema pumps obviously this would have to be insurance approved. Fortunately there is no signs of any systemic infection either.  No fevers, chills, nausea, vomiting, or diarrhea. He does have stage III lymphedema. 08/12/2019 on evaluation today patient appears to be doing well with regard to his lower extremities the edema is much better than it was during the last evaluation. Fortunately there is no signs of active infection which is great news. No fevers, chills, nausea, vomiting, or diarrhea. Overall he has been tolerating the wraps without any complication and the wound seems to be doing better as well. We are still working on gathering all the information for ordering the lymphedema pumps at this point. He does need a plan for ongoing compression as well. 10/30; the patient seems to be making nice progress. He only has a small open wound remaining on the right lateral calf. Everything on the left is healed. He has his bilateral external compression wraps. 08/30/2019 on evaluation today patient's right lower extremity actually appears to be doing quite well and in fact appears to be completely healed based on what I am seeing at this time. He shows no signs of active infection which is good news. He does have a blister over the left lower extremity which was previously healed out and he has a Velcro compression stocking on this area. With that being said he does not seem to be doing poorly at all at this point which is good news. All in all however I do think we want to keep an eye on him for another couple weeks before completely discharging him. 09/13/2019 on evaluation today patient appears to be doing well with regard to his right lower extremity although this is very swollen on the left lower extremity he actually has an opening where the blistered area we were observing last week has opened up into an actual wound. Unfortunately this just does not seem to be doing quite as well as we would have liked. I was really hoping that he would be doing just fine today and that we would be able to discharge him as healed  today. Fortunately there is no signs of active infection at this point. The area of opening is very small he has very small blisters noted distal to the actual wound opening on the left lower extremity. 09/19/2019 upon evaluation today patient appears to be doing well with regard to his bilateral lower extremities and in fact he appears to be completely healed at this time which is great news. He was also contacted by the lymphedema pump company and he has been approved apparently for the pumps and is ready to proceed from there as well. They are very happy about this. READMISSION 06/06/2020 This is a patient that we had in clinic in the fall. He had I believe wounds on his bilateral lower extremities. We healed this up with compression. He had some form of compression stocking and there was suggestion that we referred him for lymphedema pumps. The patient is telling me that he had the lymphedema pumps but has not used them in 5 months. He now lives in an assisted living and may have been previously he was at home. 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 His daughter called urgently this morning  stating that he had to be seen urgently because of blisters and edema in his lower extremities. I think she felt that that this was all lymphedema issues. We had a cancellation so he is here. The patient came in from the facility but his daughter is not here. From review of the records that accompanied admitted. He has TED hose but has not been using any more advanced stocking or the lymphedema pumps. He is on Lasix 20 mg a day. Past medical history; I have looked over his past medical history. He has advanced prostate cancer with some degree of chronic renal insufficiency although his recent creatinine was 1.21. He has an IVC filter placed in January of this year secondary to inability to anticoagulate him because of recurrent hematuria  and a history of DVT. He also has a history of diastolic congestive heart failure, COPD type 2 diabetes. ABIs in our clinic were 1.16 on the left and 1.11 on the 8/25; the patient with severe bilateral lymphedema. He also has a history of DVT and apparently has an IVC filter. He came in the clinic last week with severe bilateral lower extremity edema. So much so that I elected to put him in bilateral 3 layer compression. I was worried that things were in a state of imminent breakdown. He has compression pumps but is now in an assisted living he is not using these. I am not sure if he is keeping his legs elevated. When he came in here he was not using compression stockings Wahid, Sael ([id number]) Objective Constitutional Patient is hypertensive.. Pulse regular and within target range for patient.Marland Kitchen Respirations regular, non-labored and within target range.. Temperature is normal and within the target range for the patient.Marland Kitchen appears in no distress. Vitals Time Taken: 3:25 AM, Height: 71 in, Weight: 254 lbs, BMI: 35.4, Temperature: 98.9 F, Pulse: 68 bpm, Respiratory Rate: 20 breaths/min, Blood Pressure: 160/59 mmHg. Respiratory Respiratory effort is easy and symmetric bilaterally. Rate is normal at rest and on room air.. Bilateral breath sounds are clear and equal in all lobes with no wheezes, rales or rhonchi.. Cardiovascular Heart rhythm and rate regular, without murmur or gallop.. No evidence of CHF. I am able to feel pedal pulses this week bilaterally. Edema control is better in the lower extremities but still present. I do not think there is much edema in his thighs some posteriorly but this is not an obvious acute issue.. General Notes: Wound exam; the patient continues to have no open wound but he does have a blister on the right anterior lower leg distally. There is nothing that seems acute here his edema control is better but certainly not totally resolved. Assessment Active  Problems ICD-10 Lymphedema, not elsewhere classified Personal history of other venous thrombosis and embolism Procedures There was a Four Layer Compression Therapy Procedure with a pre-treatment ABI of 1.1 by Cornell Barman, RN. Post procedure Diagnosis Wound #: Same as Pre-Procedure Plan Follow-up Appointments: Return Appointment in 1 week. Edema Control: 4 Layer Compression System - Bilateral Compression Pump: Use compression pump on left lower extremity for 60 minutes, twice daily. Compression Pump: Use compression pump on right lower extremity for 60 minutes, twice daily. #1 I put him in 4 layer compression bilaterally I think he should be able to tolerate this 2. I have asked his daughter to bring in his compression pumps that the facility and see if he can use these twice a day while he is there. 3. I do not think he has  any evidence of an acute DVT. No evidence of CHF 4. I have asked her to bring his compression stockings next week ideally I would like to be able to discharge him and in the compression stockings and with use of the compression pumps. I told her I was concerned that the facility would be able to handle this. She did not seem to think it was much of a problem 5. Finally last week I suggested increasing his Lasix I am hopeful that the facility has been able to do this 6. He still does not have an open wound, I am not going to be able to continue to wrap this man for much longer. 7. Incidentally I had to help him transfer today. JAKYREN, FLUEGGE ([id number]) Electronic Signature(s) Signed: 06/14/2020 4:51:35 PM By: Linton Ham MD Entered By: Linton Ham on 06/13/2020 Raymond, [place] ([id number]) -------------------------------------------------------------------------------- SuperBill Details Patient Name: [name] Date of Service: 06/13/2020 Medical Record Number: [id number] Patient Account Number: [id number] Date of Birth/Sex: [birth year] (86 y.o.  M) Treating RN: Cornell Barman Primary Care Provider: Philis Fendt Other Clinician: Referring Provider: Philis Fendt Treating Provider/Extender: Tito Dine in Treatment: 1 Diagnosis Coding ICD-10 Codes Code Description I89.0 Lymphedema, not elsewhere classified Z86.718 Personal history of other venous thrombosis and embolism Facility Procedures CPT4: Description Modifier Quantity Code [id number] BILATERAL: Application of multi-layer venous compression system; leg (below knee), including 1 ankle and foot. Physician Procedures CPT4 Code: 6010932 Description: 35573 - WC PHYS LEVEL 4 - EST PT Modifier: Quantity: 1 CPT4 Code: Description: ICD-10 Diagnosis Description I89.0 Lymphedema, not elsewhere classified U20.254 Personal history of other venous thrombosis and embolism Modifier: Quantity: Electronic Signature(s) Signed: 06/14/2020 4:51:35 PM By: Linton Ham MD Entered By: Linton Ham on 06/13/2020 16:27:01

## 2020-06-20 ENCOUNTER — Other Ambulatory Visit: Payer: Self-pay

## 2020-06-20 ENCOUNTER — Encounter: Payer: Medicare Other | Attending: Internal Medicine | Admitting: Internal Medicine

## 2020-06-20 DIAGNOSIS — C7951 Secondary malignant neoplasm of bone: Secondary | ICD-10-CM | POA: Diagnosis not present

## 2020-06-20 DIAGNOSIS — I2510 Atherosclerotic heart disease of native coronary artery without angina pectoris: Secondary | ICD-10-CM | POA: Insufficient documentation

## 2020-06-20 DIAGNOSIS — E1122 Type 2 diabetes mellitus with diabetic chronic kidney disease: Secondary | ICD-10-CM | POA: Insufficient documentation

## 2020-06-20 DIAGNOSIS — C61 Malignant neoplasm of prostate: Secondary | ICD-10-CM | POA: Diagnosis not present

## 2020-06-20 DIAGNOSIS — J449 Chronic obstructive pulmonary disease, unspecified: Secondary | ICD-10-CM | POA: Insufficient documentation

## 2020-06-20 DIAGNOSIS — I5032 Chronic diastolic (congestive) heart failure: Secondary | ICD-10-CM | POA: Insufficient documentation

## 2020-06-20 DIAGNOSIS — N189 Chronic kidney disease, unspecified: Secondary | ICD-10-CM | POA: Insufficient documentation

## 2020-06-20 DIAGNOSIS — I890 Lymphedema, not elsewhere classified: Secondary | ICD-10-CM | POA: Insufficient documentation

## 2020-06-20 DIAGNOSIS — I130 Hypertensive heart and chronic kidney disease with heart failure and stage 1 through stage 4 chronic kidney disease, or unspecified chronic kidney disease: Secondary | ICD-10-CM | POA: Insufficient documentation

## 2020-06-20 DIAGNOSIS — Z86718 Personal history of other venous thrombosis and embolism: Secondary | ICD-10-CM | POA: Insufficient documentation

## 2020-06-21 NOTE — Progress Notes (Signed)
[name], [name] ([id number]) Visit Report for 06/20/2020 Arrival Information Details Patient Name: [name], [name] Date of Service: 06/20/2020 2:30 PM Medical Record Number: [id number] Patient Account Number: [id number] Date of Birth/Sex: [birth year] (86 y.o. M) Treating RN: [name] Primary Care [name]: [name] Other Clinician: Referring [name]: [name] Treating [name]/Extender: [name] in Treatment: 2 Visit Information History Since Last Visit Added or deleted any medications: No Patient Arrived: Wheel Chair Any new allergies or adverse reactions: No Arrival Time: 14:40 Had a fall or experienced change in No Accompanied By: daughter activities of daily living that may affect Transfer Assistance: Manual risk of falls: Patient Identification Verified: Yes Signs or symptoms of abuse/neglect since last visito No Secondary Verification Process Completed: Yes Hospitalized since last visit: No Patient Requires Transmission-Based Precautions: No Implantable device outside of the clinic excluding No Patient Has Alerts: No cellular tissue based products placed in the center since last visit: Has Dressing in Place as Prescribed: Yes Has Compression in Place as Prescribed: Yes Pain Present Now: No Electronic Signature(s) Signed: 06/20/2020 3:55:47 PM By: [name] [name], [name], [name] Entered By: [name] on 06/20/2020 14:40:49 [name] ([id number]) -------------------------------------------------------------------------------- Clinic Level of Care Assessment Details Patient Name: [name] Date of Service: 06/20/2020 2:30 PM Medical Record Number: [id number] Patient Account Number: [id number] Date of Birth/Sex: 1932/04/05 (86 y.o. M) Treating RN: [name] Primary Care [name]: [name] Other Clinician: Referring [name]: [name] Treating [name]/Extender: [name] in  Treatment: 2 Clinic Level of Care Assessment Items TOOL 4 Quantity Score []  - Use when only an EandM is performed on FOLLOW-UP visit 0 ASSESSMENTS - Nursing Assessment / Reassessment X - Reassessment of Co-morbidities (includes updates in patient status) 1 10 X- 1 5 Reassessment of Adherence to Treatment Plan ASSESSMENTS - Wound and Skin Assessment / Reassessment []  - Simple Wound Assessment / Reassessment - one wound 0 []  - 0 Complex Wound Assessment / Reassessment - multiple wounds []  - 0 Dermatologic / Skin Assessment (not related to wound area) ASSESSMENTS - Focused Assessment []  - Circumferential Edema Measurements - multi extremities 0 []  - 0 Nutritional Assessment / Counseling / Intervention []  - 0 Lower Extremity Assessment (monofilament, tuning fork, pulses) []  - 0 Peripheral Arterial Disease Assessment (using hand held doppler) ASSESSMENTS - Ostomy and/or Continence Assessment and Care []  - Incontinence Assessment and Management 0 []  - 0 Ostomy Care Assessment and Management (repouching, etc.) PROCESS - Coordination of Care X - Simple Patient / Family Education for ongoing care 1 15 []  - 0 Complex (extensive) Patient / Family Education for ongoing care X- 1 10 Staff obtains Programmer, systems, Records, Test Results / Process Orders []  - 0 Staff telephones HHA, Nursing Homes / Clarify orders / etc []  - 0 Routine Transfer to another Facility (non-emergent condition) []  - 0 Routine Hospital Admission (non-emergent condition) []  - 0 New Admissions / Biomedical engineer / Ordering NPWT, Apligraf, etc. []  - 0 Emergency Hospital Admission (emergent condition) X- 1 10 Simple Discharge Coordination []  - 0 Complex (extensive) Discharge Coordination PROCESS - Special Needs []  - Pediatric / Minor Patient Management 0 []  - 0 Isolation Patient Management []  - 0 Hearing / Language / Visual special needs []  - 0 Assessment of Community assistance (transportation, D/C  planning, etc.) []  - 0 Additional assistance / Altered mentation []  - 0 Support Surface(s) Assessment (bed, cushion, seat, etc.) INTERVENTIONS - Wound Cleansing / Measurement [name] ([id number]) []  - 0 Simple Wound Cleansing - one  wound []  - 0 Complex Wound Cleansing - multiple wounds []  - 0 Wound Imaging (photographs - any number of wounds) []  - 0 Wound Tracing (instead of photographs) []  - 0 Simple Wound Measurement - one wound []  - 0 Complex Wound Measurement - multiple wounds INTERVENTIONS - Wound Dressings []  - Small Wound Dressing one or multiple wounds 0 []  - 0 Medium Wound Dressing one or multiple wounds []  - 0 Large Wound Dressing one or multiple wounds []  - 0 Application of Medications - topical []  - 0 Application of Medications - injection INTERVENTIONS - Miscellaneous []  - External ear exam 0 []  - 0 Specimen Collection (cultures, biopsies, blood, body fluids, etc.) []  - 0 Specimen(s) / Culture(s) sent or taken to Lab for analysis []  - 0 Patient Transfer (multiple staff / Civil Service fast streamer / Similar devices) []  - 0 Simple Staple / Suture removal (25 or less) []  - 0 Complex Staple / Suture removal (26 or more) []  - 0 Hypo / Hyperglycemic Management (close monitor of Blood Glucose) []  - 0 Ankle / Brachial Index (ABI) - do not check if billed separately X- 1 5 Vital Signs Has the patient been seen at the hospital within the last three years: Yes Total Score: 55 Level Of Care: New/Established - Level 2 Electronic Signature(s) Signed: 06/20/2020 4:49:26 PM By: [name] Entered By: [name] on 06/20/2020 15:15:39 [name] ([id number]) -------------------------------------------------------------------------------- Encounter Discharge Information Details Patient Name: [name] Date of Service: 06/20/2020 2:30 PM Medical Record Number: [id number] Patient Account Number: [id number] Date of Birth/Sex: 1931-12-12 (86 y.o.  M) Treating RN: [name] Primary Care Sharnell Knight: [name] Other Clinician: Referring Naiya Corral: [name] Treating Drevon Plog/Extender: [name] in Treatment: 2 Encounter Discharge Information Items Discharge Condition: Stable Ambulatory Status: Wheelchair Discharge Destination: Home Transportation: Private Auto Accompanied By: self Schedule Follow-up Appointment: Yes Clinical Summary of Care: Electronic Signature(s) Signed: 06/20/2020 4:49:26 PM By: [name] Entered By: [name] on 06/20/2020 15:18:06 [name] ([id number]) -------------------------------------------------------------------------------- Lower Extremity Assessment Details Patient Name: [name] Date of Service: 06/20/2020 2:30 PM Medical Record Number: [id number] Patient Account Number: [id number] Date of Birth/Sex: August 19, 1932 (86 y.o. M) Treating RN: [name] Primary Care Harmoney Sienkiewicz: [name] Other Clinician: Referring Kallyn Demarcus: [name] Treating Terrelle Ruffolo/Extender: Ricard Dillon Weeks in Treatment: 2 Edema Assessment Assessed: [Left: Yes] [Right: Yes] Edema: [Left: Yes] [Right: Yes] Calf Left: Right: Point of Measurement: 14 cm From Medial Instep 49.6 cm 41.8 cm Ankle Left: Right: Point of Measurement: 4 cm From Medial Instep 30.1 cm 31.3 cm Vascular Assessment Pulses: Dorsalis Pedis Palpable: [Left:Yes] [Right:Yes] Posterior Tibial Palpable: [Left:Yes] [Right:Yes] Electronic Signature(s) Signed: 06/20/2020 4:49:26 PM By: [name] Entered By: [name] on 06/20/2020 15:07:02 [name] ([id number]) -------------------------------------------------------------------------------- Multi Wound Chart Details Patient Name: [name] Date of Service: 06/20/2020 2:30 PM Medical Record Number: [id number] Patient Account Number: [id number] Date of Birth/Sex: 1932/04/08 (86 y.o. M) Treating RN: [name] Primary Care Breonia Kirstein: [name] Other Clinician: Referring Genecis Veley: [name] Treating Ryota Treece/Extender: Ricard Dillon Weeks in Treatment: 2 Vital Signs Height(in): 71 Pulse(bpm): 72 Weight(lbs): 254 Blood Pressure(mmHg): 151/60 Body Mass Index(BMI): 35 Temperature(F): 98.0 Respiratory Rate(breaths/min): 18 Wound Assessments Treatment Notes Electronic Signature(s) Signed: 06/20/2020 4:49:26 PM By: [name] Entered By: [name] on 06/20/2020 15:12:15 DERREL, MOORE ([id number]) -------------------------------------------------------------------------------- Multi-Disciplinary Care Plan Details Patient Name: [name] Date of Service: 06/20/2020 2:30 PM Medical Record Number: [id number] Patient Account Number: [id number] Date of Birth/Sex: November 23, 1931 (86 y.o. M) Treating RN: Melina Copa,  Sharyn Lull Primary Care Melquisedec Journey: [name] Other Clinician: Referring Andry Bogden: [name] Treating Cai Anfinson/Extender: [name] in Treatment: 2 Active Inactive Orientation to the Wound Care Program Nursing Diagnoses: Knowledge deficit related to the wound healing center program Goals: Patient/caregiver will verbalize understanding of the [facility] Date Initiated: 06/06/2020 Target Resolution Date: 06/06/2020 Goal Status: Active Interventions: Provide education on orientation to the wound center Notes: Venous Leg Ulcer Nursing Diagnoses: Knowledge deficit related to disease process and management Goals: Patient will maintain optimal edema control Date Initiated: 06/06/2020 Target Resolution Date: 06/20/2020 Goal Status: Active Interventions: Assess peripheral edema status every visit. Treatment Activities: Therapeutic compression applied : 06/06/2020 Notes: Wound/Skin Impairment Nursing Diagnoses: Impaired tissue integrity Goals: Patient/caregiver will verbalize understanding of skin care regimen Date  Initiated: 06/06/2020 Target Resolution Date: 06/20/2020 Goal Status: Active Interventions: Assess patient/caregiver ability to perform ulcer/skin care regimen upon admission and as needed Treatment Activities: Skin care regimen initiated : 06/06/2020 Topical wound management initiated : 06/06/2020 Notes: Electronic Signature(s) Signed: 06/20/2020 4:49:26 PM By: Pilar Jarvis, [place] ([id number]) Entered By: [name] on 06/20/2020 15:12:04 [name] ([id number]) -------------------------------------------------------------------------------- Non-Wound Condition Assessment Details Patient Name: [name] Date of Service: 06/20/2020 2:30 PM Medical Record Number: [id number] Patient Account Number: [id number] Date of Birth/Sex: [birth year] (86 y.o. M) Treating RN: [name] Primary Care Kenan Moodie: [name] Other Clinician: Referring Ferdie Bakken: [name] Treating Davis Ambrosini/Extender: Ricard Dillon Weeks in Treatment: 2 Non-Wound Condition: Condition: Lymphedema Location: Leg Side: Bilateral Notes: significant swelling in both legs today with reported increase in pain. Photos Notes right lower extremity: ankle 31.3 calf 41.8 Left lower extremity: ankle 30.1 calf 49.6 Electronic Signature(s) Signed: 06/20/2020 4:49:26 PM By: [name] Entered By: [name] on 06/20/2020 15:04:00 [name] ([id number]) -------------------------------------------------------------------------------- Pain Assessment Details Patient Name: [name] Date of Service: 06/20/2020 2:30 PM Medical Record Number: [id number] Patient Account Number: [id number] Date of Birth/Sex: 1932/01/10 (86 y.o. M) Treating RN: [name] Primary Care Enrigue Hashimi: [name] Other Clinician: Referring Jennika Ringgold: [name] Treating Jesiah Grismer/Extender: Ricard Dillon Weeks in Treatment: 2 Active Problems Location of Pain Severity and Description  of Pain Patient Has Paino No Site Locations Pain Management and Medication Current Pain Management: Electronic Signature(s) Signed: 06/20/2020 4:49:26 PM By: [name] Entered By: [name] on 06/20/2020 14:59:53 [name] ([id number]) -------------------------------------------------------------------------------- Patient/Caregiver Education Details Patient Name: [name] Date of Service: 06/20/2020 2:30 PM Medical Record Number: [id number] Patient Account Number: [id number] Date of Birth/Gender: 29-Apr-1932 (86 y.o. M) Treating RN: [name] Primary Care Physician: [name] Other Clinician: Referring Physician: Philis [name] Treating Physician/Extender: [name] in Treatment: 2 Education Assessment Education Provided To: Patient Education Topics Provided Venous: Handouts: Controlling Swelling with Compression Stockings , Other: use pumps twice daily Methods: Demonstration, Explain/Verbal Responses: State content correctly Wound/Skin Impairment: Electronic Signature(s) Signed: 06/20/2020 4:49:26 PM By: [name] Entered By: [name] on 06/20/2020 15:16:43 [name] ([id number]) -------------------------------------------------------------------------------- [place]ade Details Patient Name: [name] Date of Service: 06/20/2020 2:30 PM Medical Record Number: [id number] Patient Account Number: [id number] Date of Birth/Sex: February 15, 1932 (86 y.o. M) Treating RN: [name] Primary Care Doristine Shehan: [name] Other Clinician: Referring Nikodem Leadbetter: [name] Treating Audrey Eller/Extender: [name] in Treatment: 2 Vital Signs Time Taken: 14:40 Temperature (F): 98.0 Height (in): 71 Pulse (bpm): 72 Weight (lbs): 254 Respiratory Rate (breaths/min): 18 Body Mass Index (BMI): 35.4 Blood Pressure (mmHg): 151/60 Reference Range: 80 - 120 mg / dl Electronic Signature(s) Signed: 06/20/2020  3:55:47 PM By:  Becky Sax, Sallie [name], [name], [name] Entered By: [name] on 06/20/2020 14:44:38

## 2020-06-21 NOTE — Progress Notes (Signed)
TRAJAN, GROVE ([id number]) Visit Report for 06/20/2020 HPI Details Patient Name: [name], [name] Date of Service: 06/20/2020 2:30 PM Medical Record Number: [id number] Patient Account Number: [id number] Date of Birth/Sex: 1932-07-26 (86 y.o. M) Treating RN: Cornell Barman Primary Care Provider: Philis Fendt Other Clinician: Referring Provider: Philis Fendt Treating Provider/Extender: Tito Dine in Treatment: 2 History of Present Illness HPI Description: 2510/15/2020 on evaluation today patient presents for initial evaluation here in our clinic concerning issues that he has been having with lymphedema for quite some time. He has intermittent issues with blistering of his lower extremities for which she will go to the wound center and typically they will wrap him and then subsequently this gets better. That is been an ongoing issue for quite some time. Currently a couple weeks ago he had blisters pop up on the left leg and the right leg currently there is a small spot open on the right but nothing on the left. He does have a history of coronary artery disease, COPD, diabetes mellitus type 2, and hypertension. Fortunately there is no signs of any cellulitis or active infection at this time. He has never had any lymphedema pumps and in fact did not even know what I was talking about when I mention this to him although he has been wrapped and had compression recommended for him long enough he may qualify for going ahead and initiating therapy with lymphedema pumps obviously this would have to be insurance approved. Fortunately there is no signs of any systemic infection either. No fevers, chills, nausea, vomiting, or diarrhea. He does have stage III lymphedema. 08/12/2019 on evaluation today patient appears to be doing well with regard to his lower extremities the edema is much better than it was during the last evaluation. Fortunately there is no signs of active infection which is great  news. No fevers, chills, nausea, vomiting, or diarrhea. Overall he has been tolerating the wraps without any complication and the wound seems to be doing better as well. We are still working on gathering all the information for ordering the lymphedema pumps at this point. He does need a plan for ongoing compression as well. 10/30; the patient seems to be making nice progress. He only has a small open wound remaining on the right lateral calf. Everything on the left is healed. He has his bilateral external compression wraps. 08/30/2019 on evaluation today patient's right lower extremity actually appears to be doing quite well and in fact appears to be completely healed based on what I am seeing at this time. He shows no signs of active infection which is good news. He does have a blister over the left lower extremity which was previously healed out and he has a Velcro compression stocking on this area. With that being said he does not seem to be doing poorly at all at this point which is good news. All in all however I do think we want to keep an eye on him for another couple weeks before completely discharging him. 09/13/2019 on evaluation today patient appears to be doing well with regard to his right lower extremity although this is very swollen on the left lower extremity he actually has an opening where the blistered area we were observing last week has opened up into an actual wound. Unfortunately this just does not seem to be doing quite as well as we would have liked. I was really hoping that he would be doing just fine today and that we would be able  to discharge him as healed today. Fortunately there is no signs of active infection at this point. The area of opening is very small he has very small blisters noted distal to the actual wound opening on the left lower extremity. 09/19/2019 upon evaluation today patient appears to be doing well with regard to his bilateral lower extremities and in  fact he appears to be completely healed at this time which is great news. He was also contacted by the lymphedema pump company and he has been approved apparently for the pumps and is ready to proceed from there as well. They are very happy about this. READMISSION 06/06/2020 This is a patient that we had in clinic in the fall. He had I believe wounds on his bilateral lower extremities. We healed this up with compression. He had some form of compression stocking and there was suggestion that we referred him for lymphedema pumps. The patient is telling me that he had the lymphedema pumps but has not used them in 5 months. He now lives in an assisted living and may have been previously he was at home. 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 His daughter called urgently this morning stating that he had to be seen urgently because of blisters and edema in his lower extremities. I think she felt that that this was all lymphedema issues. We had a cancellation so he is here. The patient came in from the facility but his daughter is not here. From review of the records that accompanied admitted. He has TED hose but has not been using any more advanced stocking or the lymphedema pumps. He is on Lasix 20 mg a day. Past medical history; I have looked over his past medical history. He has advanced prostate cancer with some degree of chronic renal insufficiency although his recent creatinine was 1.21. He has an IVC filter placed in January of this year secondary to inability to anticoagulate him because of recurrent hematuria and a history of DVT. He also has a history of diastolic congestive heart failure, COPD type 2 diabetes. ABIs in our clinic were 1.16 on the left and 1.11 on the 8/25; the patient with severe bilateral lymphedema. He also has a history of DVT and apparently has an IVC filter. He came in the clinic last week with severe  bilateral lower extremity edema. So much so that I elected to put him in bilateral 3 layer compression. I was worried that things were in a state of imminent breakdown. He has compression pumps but is now in an assisted living he is not using these. I am not sure if he is keeping his legs elevated. When he came in here he was not using compression stockings 9/1; this was a patient with severe bilateral lymphedema. He also has a history of a DVT and an IVC filter. He came in with severe bilateral edema nonpitting. We put him in 3 layer compression even though he only had 3 blistered areas on his bilateral anterior tibia. As it turned out he has external compression pumps which she is now using twice a day at the assisted living where he resides. His daughter reports they also have stockings. YOUSOF, [name] ([id number]) Electronic Signature(s) Signed: 06/21/2020 9:14:54 AM By: Linton Ham MD Entered By: Linton Ham on 06/20/2020 15:18:12 [name], [name] ([id number]) -------------------------------------------------------------------------------- Physical Exam Details Patient Name: [name] Date of Service: 06/20/2020 2:30 PM Medical Record Number: [id number] Patient  Account Number: [id number] Date of Birth/Sex: January 06, 1932 (86 y.o. M) Treating RN: Cornell Barman Primary Care Provider: Philis Fendt Other Clinician: Referring Provider: Philis Fendt Treating Provider/Extender: Tito Dine in Treatment: 2 Constitutional Patient is hypertensive.. Pulse regular and within target range for patient.Marland Kitchen Respirations regular, non-labored and within target range.. Temperature is normal and within the target range for the patient.Marland Kitchen appears in no distress. Cardiovascular Pedal pulses are palpable bilaterally. We have much better edema control.. Notes wound exam; no open wounds. Much better edema control Electronic Signature(s) Signed: 06/21/2020 9:14:54 AM By: Linton Ham  MD Entered By: Linton Ham on 06/20/2020 15:19:59 [name] ([id number]) -------------------------------------------------------------------------------- Physician Orders Details Patient Name: [name] Date of Service: 06/20/2020 2:30 PM Medical Record Number: [id number] Patient Account Number: [id number] Date of Birth/Sex: 25-Sep-1932 (86 y.o. M) Treating RN: Grover Canavan Primary Care Provider: Philis Fendt Other Clinician: Referring Provider: Philis Fendt Treating Provider/Extender: Tito Dine in Treatment: 2 Verbal / Phone Orders: No Diagnosis Coding Skin Barriers/Peri-Wound Care o Moisturizing lotion - Lotion leg well at night before bed. Edema Control o Patient to wear own compression stockings o Compression Pump: Use compression pump on left lower extremity for 60 minutes, twice daily. o Compression Pump: Use compression pump on right lower extremity for 60 minutes, twice daily. Discharge From [facility] o Discharge from [place] complete Electronic Signature(s) Signed: 06/20/2020 4:49:26 PM By: Grover Canavan Signed: 06/21/2020 9:14:54 AM By: Linton Ham MD Entered By: Grover Canavan on 06/20/2020 15:14:48 [name], [name] ([id number]) -------------------------------------------------------------------------------- Problem List Details Patient Name: [name] Date of Service: 06/20/2020 2:30 PM Medical Record Number: [id number] Patient Account Number: [id number] Date of Birth/Sex: 03-16-1932 (86 y.o. M) Treating RN: Cornell Barman Primary Care Provider: Philis Fendt Other Clinician: Referring Provider: Philis Fendt Treating Provider/Extender: Tito Dine in Treatment: 2 Active Problems ICD-10 Encounter Code Description Active Date MDM Diagnosis I89.0 Lymphedema, not elsewhere classified 06/06/2020 No Yes Z86.718 Personal history of other venous thrombosis and embolism 06/06/2020 No  Yes Inactive Problems Resolved Problems Electronic Signature(s) Signed: 06/21/2020 9:14:54 AM By: Linton Ham MD Entered By: Linton Ham on 06/20/2020 15:17:07 [name] ([id number]) -------------------------------------------------------------------------------- Progress Note Details Patient Name: [name] Date of Service: 06/20/2020 2:30 PM Medical Record Number: [id number] Patient Account Number: [id number] Date of Birth/Sex: 09-May-1932 (86 y.o. M) Treating RN: Cornell Barman Primary Care Provider: Philis Fendt Other Clinician: Referring Provider: Philis Fendt Treating Provider/Extender: Tito Dine in Treatment: 2 Subjective History of Present Illness (HPI) 2510/15/2020 on evaluation today patient presents for initial evaluation here in our clinic concerning issues that he has been having with lymphedema for quite some time. He has intermittent issues with blistering of his lower extremities for which she will go to the wound center and typically they will wrap him and then subsequently this gets better. That is been an ongoing issue for quite some time. Currently a couple weeks ago he had blisters pop up on the left leg and the right leg currently there is a small spot open on the right but nothing on the left. He does have a history of coronary artery disease, COPD, diabetes mellitus type 2, and hypertension. Fortunately there is no signs of any cellulitis or active infection at this time. He has never had any lymphedema pumps and in fact did not even know what I was talking about when I mention this to him although he has been wrapped and had compression recommended for him long enough he  may qualify for going ahead and initiating therapy with lymphedema pumps obviously this would have to be insurance approved. Fortunately there is no signs of any systemic infection either. No fevers, chills, nausea, vomiting, or diarrhea. He does have stage III  lymphedema. 08/12/2019 on evaluation today patient appears to be doing well with regard to his lower extremities the edema is much better than it was during the last evaluation. Fortunately there is no signs of active infection which is great news. No fevers, chills, nausea, vomiting, or diarrhea. Overall he has been tolerating the wraps without any complication and the wound seems to be doing better as well. We are still working on gathering all the information for ordering the lymphedema pumps at this point. He does need a plan for ongoing compression as well. 10/30; the patient seems to be making nice progress. He only has a small open wound remaining on the right lateral calf. Everything on the left is healed. He has his bilateral external compression wraps. 08/30/2019 on evaluation today patient's right lower extremity actually appears to be doing quite well and in fact appears to be completely healed based on what I am seeing at this time. He shows no signs of active infection which is good news. He does have a blister over the left lower extremity which was previously healed out and he has a Velcro compression stocking on this area. With that being said he does not seem to be doing poorly at all at this point which is good news. All in all however I do think we want to keep an eye on him for another couple weeks before completely discharging him. 09/13/2019 on evaluation today patient appears to be doing well with regard to his right lower extremity although this is very swollen on the left lower extremity he actually has an opening where the blistered area we were observing last week has opened up into an actual wound. Unfortunately this just does not seem to be doing quite as well as we would have liked. I was really hoping that he would be doing just fine today and that we would be able to discharge him as healed today. Fortunately there is no signs of active infection at this point. The  area of opening is very small he has very small blisters noted distal to the actual wound opening on the left lower extremity. 09/19/2019 upon evaluation today patient appears to be doing well with regard to his bilateral lower extremities and in fact he appears to be completely healed at this time which is great news. He was also contacted by the lymphedema pump company and he has been approved apparently for the pumps and is ready to proceed from there as well. They are very happy about this. READMISSION 06/06/2020 This is a patient that we had in clinic in the fall. He had I believe wounds on his bilateral lower extremities. We healed this up with compression. He had some form of compression stocking and there was suggestion that we referred him for lymphedema pumps. The patient is telling me that he had the lymphedema pumps but has not used them in 5 months. He now lives in an assisted living and may have been previously he was at home. 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 His daughter called urgently this morning stating that he had to be seen urgently because of blisters and edema in his lower extremities.  I think she felt that that this was all lymphedema issues. We had a cancellation so he is here. The patient came in from the facility but his daughter is not here. From review of the records that accompanied admitted. He has TED hose but has not been using any more advanced stocking or the lymphedema pumps. He is on Lasix 20 mg a day. Past medical history; I have looked over his past medical history. He has advanced prostate cancer with some degree of chronic renal insufficiency although his recent creatinine was 1.21. He has an IVC filter placed in January of this year secondary to inability to anticoagulate him because of recurrent hematuria and a history of DVT. He also has a history of diastolic congestive heart  failure, COPD type 2 diabetes. ABIs in our clinic were 1.16 on the left and 1.11 on the 8/25; the patient with severe bilateral lymphedema. He also has a history of DVT and apparently has an IVC filter. He came in the clinic last week with severe bilateral lower extremity edema. So much so that I elected to put him in bilateral 3 layer compression. I was worried that things were in a state of imminent breakdown. He has compression pumps but is now in an assisted living he is not using these. I am not sure if he is keeping his legs elevated. When he came in here he was not using compression stockings 9/1; this was a patient with severe bilateral lymphedema. He also has a history of a DVT and an IVC filter. He came in with severe bilateral edema nonpitting. We put him in 3 layer compression even though he only had 3 blistered areas on his bilateral anterior tibia. As it turned out he has external compression pumps which she is now using twice a day at the assisted living where he resides. His daughter reports they also have stockings. [name], [name] ([id number]) Objective Constitutional Patient is hypertensive.. Pulse regular and within target range for patient.Marland Kitchen Respirations regular, non-labored and within target range.. Temperature is normal and within the target range for the patient.Marland Kitchen appears in no distress. Vitals Time Taken: 2:40 PM, Height: 71 in, Weight: 254 lbs, BMI: 35.4, Temperature: 98.0 F, Pulse: 72 bpm, Respiratory Rate: 18 breaths/min, Blood Pressure: 151/60 mmHg. Cardiovascular Pedal pulses are palpable bilaterally. We have much better edema control.. General Notes: wound exam; no open wounds. Much better edema control Assessment Active Problems ICD-10 Lymphedema, not elsewhere classified Personal history of other venous thrombosis and embolism Plan Skin Barriers/Peri-Wound Care: Moisturizing lotion - Lotion leg well at night before bed. Edema Control: Patient to wear  own compression stockings Compression Pump: Use compression pump on left lower extremity for 60 minutes, twice daily. Compression Pump: Use compression pump on right lower extremity for 60 minutes, twice daily. Discharge From [facility] Services: Discharge from [facility] complete 1 the patient can be discharged from the clinic 2 the lymphedema is under much better control. 3. He will need to wear his compression stockings and use his external compression pumps twice a day. We talked to his daughter about this. 4. Finally lubricating his skin with a moisturizer at night Electronic Signature(s) Signed: 06/21/2020 9:14:54 AM By: Linton Ham MD Entered By: Linton Ham on 06/20/2020 15:24:50 [name] ([id number]) -------------------------------------------------------------------------------- SuperBill Details Patient Name: [name] Date of Service: 06/20/2020 Medical Record Number: [id number] Patient Account Number: [id number] Date of Birth/Sex: Jul 01, 1932 (86 y.o. M) Treating RN: Cornell Barman Primary Care Provider: Philis Fendt Other  Clinician: Referring Provider: Philis Fendt Treating Provider/Extender: Tito Dine in Treatment: 2 Diagnosis Coding ICD-10 Codes Code Description I89.0 Lymphedema, not elsewhere classified Z86.718 Personal history of other venous thrombosis and embolism Facility Procedures CPT4 Code: [id number] Description: [id number] - WOUND CARE VISIT-LEV 2 EST PT Modifier: Quantity: 1 Physician Procedures CPT4 Code: 1552080 Description: 22336 - WC PHYS LEVEL 3 - EST PT Modifier: Quantity: 1 CPT4 Code: Description: ICD-10 Diagnosis Description I89.0 Lymphedema, not elsewhere classified P22.449 Personal history of other venous thrombosis and embolism Modifier: Quantity: Electronic Signature(s) Signed: 06/21/2020 9:14:54 AM By: Linton Ham MD Entered By: Linton Ham on 06/20/2020 15:26:04

## 2020-06-26 ENCOUNTER — Other Ambulatory Visit: Payer: Self-pay

## 2020-06-26 DIAGNOSIS — C7951 Secondary malignant neoplasm of bone: Secondary | ICD-10-CM

## 2020-06-26 DIAGNOSIS — D649 Anemia, unspecified: Secondary | ICD-10-CM

## 2020-06-26 DIAGNOSIS — D472 Monoclonal gammopathy: Secondary | ICD-10-CM

## 2020-06-27 ENCOUNTER — Inpatient Hospital Stay: Payer: Medicare Other | Admitting: Hematology and Oncology

## 2020-06-27 ENCOUNTER — Inpatient Hospital Stay: Payer: Medicare Other

## 2020-06-27 NOTE — Progress Notes (Signed)
[facility]  [address] [place], [place] [address] Phone: [phone]  Fax: [phone]   Clinic Day:  06/28/2020  Referring physician: [name], PA-C  Chief Complaint: Brandon Busing Sr. is a 86 y.o. male with a metastatic prostate cancer and stage III chronic kidney disease who is seen for 8 week assessment.  HPI: The patient was last seen in the medical oncology clinic on 05/02/2020 by Faythe Casa, NP. At that time, he was doing well. Strength had improved with PT and OT at [facility]. He was being treated for a UTI. Exam was stable. Hematocrit was 32.4, hemoglobin 10.7, platelets 175,000, WBC 6,400. Glucose was 223. Creatinine was 1.27 (CrCl 59 ml/min). Total protein was 6.4. PSA was 35.25.  Labs on 05/30/2020 revealed a hematocrit of 34.0, hemoglobin 10.9, MCV 89.9, platelets 169,000, WBC 6,200. Glucose was 223. PSA was 45.03.  He received Degarelix injections on 05/02/2020 and 05/30/2020.  During the interim, he has been "fairly well." He has been struggling with leg swelling and has seen wound care several times. His daughter thinks that this is due to an increase in his BP medication. He was put back on Lasix and now has urinary frequency.   The patient denies any pain. His diabetes is well controlled. His appetite is good and he is eating well. He is still at [facility] for PT and OT.   Past Medical History:  Diagnosis Date  . Anemia, normocytic normochromic   . BPH (benign prostatic hyperplasia)   . CAD (coronary artery disease)   . Cataracts, bilateral   . Chronic renal insufficiency   . Colon polyp   . COPD (chronic obstructive pulmonary disease) ([place])   . Diabetes mellitus without complication ([place])   . DVT (deep venous thrombosis) ([place])   . Edema of both legs   . Glaucoma   . Gout   . Heart murmur   . Hepatitis C antibody test positive 10/20/2019  . Hyperlipidemia   . Hypertension   . Moderate persistent  asthma without complication   . Myocardial infarction ([place])   . Neuromuscular disorder ([place])   . Neuropathy   . Peripheral vascular disease ([place])   . Premature ejaculation   . Prostate cancer ([place])   . RLS (restless legs syndrome)   . Sleep apnea   . Stented coronary artery 11/13/2015  . Uncontrolled type 2 diabetes mellitus with stage 3 chronic kidney disease, with long-term current use of insulin ([place])     Past Surgical History:  Procedure Laterality Date  . APPENDECTOMY    . [place]  2009  . COLONOSCOPY  2012  . EYE SURGERY    . IVC FILTER INSERTION N/A 11/07/2019   Procedure: IVC FILTER INSERTION;  Surgeon: Algernon Huxley, MD;  Location: [facility];  Service: Cardiovascular;  Laterality: N/A;  . PROSTATE SURGERY    . TONSILLECTOMY      History reviewed. No pertinent family history.  Social History:  reports that he has quit smoking. He has never used smokeless tobacco. No history on file for alcohol use and drug use. He previously smoked <1 pack/day x 20 years (stopped smoking with cardiac stent placement). He previously drank liquor on the weekend (stopped 2020). He is retired Dance movement psychotherapist). He previously lived outside of [place]. He then moved to [place] then [place] to live with his daughter Christipher Rieger [phone]). His wife died of cancer 1 year ago.He is currently  living at [facility] assisted living. The patient is accompanied by his daughter, [name], on the iPad today.  Allergies:  Allergies  Allergen Reactions  . Metformin And Related Diarrhea    Intolerance due to naturally loose bowels    Current Medications: Current Outpatient Medications  Medication Sig Dispense Refill  . abiraterone acetate (ZYTIGA) 250 MG tablet TAKE 4 TABLETS BY MOUTH DAILY ON AN EMPTY STOMACH 1 HOUR BEFORE OR 2 HOURS AFTER MEALS 120 tablet 1  . amLODipine (NORVASC) 10 MG tablet Take 10 mg by mouth daily.    Marland Kitchen atorvastatin  (LIPITOR) 80 MG tablet Take 80 mg by mouth daily.    . carvedilol (COREG) 25 MG tablet Take 25 mg by mouth 2 (two) times daily with a meal.    . Cholecalciferol (VITAMIN D) 50 MCG (2000 UT) tablet Take 2,000 Units by mouth daily.     . DULoxetine (CYMBALTA) 60 MG capsule Take 60 mg by mouth daily.    . famotidine (PEPCID) 20 MG tablet Take 20 mg by mouth at bedtime as needed for heartburn.     . finasteride (PROSCAR) 5 MG tablet Take 5 mg by mouth daily.    . furosemide (LASIX) 40 MG tablet Take 40 mg by mouth daily.    . insulin aspart (NOVOLOG) 100 UNIT/ML injection Inject 5 Units into the skin 3 (three) times daily with meals. 10 mL 11  . Insulin Detemir (LEVEMIR) 100 UNIT/ML Pen Inject 15 Units into the skin daily. 20 mL 0  . Multiple Vitamin (MULTI-VITAMIN) tablet Take 1 tablet by mouth daily.    . predniSONE (DELTASONE) 5 MG tablet TAKE 1 TABLET BY MOUTH DAILY WITH BREAKFAST. BEGIN TAKING WITH INITIATION OF ABIRATERONE. 30 tablet 0  . sulfamethoxazole-trimethoprim (BACTRIM DS) 800-160 MG tablet Take 1 tablet by mouth 2 (two) times daily.    Marland Kitchen UNIFINE SAFECONTROL PEN NEEDLE 30G X 5 MM MISC     . feeding supplement, ENSURE ENLIVE, (ENSURE ENLIVE) LIQD Take 237 mLs by mouth 4 (four) times daily -  with meals and at bedtime. (Patient not taking: Reported on 06/28/2020) 237 mL 12  . gabapentin (NEURONTIN) 100 MG capsule Take 1 capsule (100 mg total) by mouth 2 (two) times daily. (Patient not taking: Reported on 06/28/2020) 60 capsule 0  . nitroGLYCERIN (NITROSTAT) 0.4 MG SL tablet Place 0.4 mg under the tongue every 5 (five) minutes as needed for chest pain.  (Patient not taking: Reported on 06/28/2020)    . polyethylene glycol (MIRALAX / GLYCOLAX) 17 g packet Take 17 g by mouth daily. (Patient not taking: Reported on 06/28/2020) 14 each 0  . senna-docusate (SENOKOT-S) 8.6-50 MG tablet Take 1 tablet by mouth 2 (two) times daily. (Patient not taking: Reported on 06/28/2020) 10 tablet 0   No current  facility-administered medications for this visit.   Review of Systems  Constitutional: Negative for chills, diaphoresis, fever, malaise/fatigue and weight loss (up 1 lb).       Feels "fairly well".  HENT: Negative for congestion, ear discharge, ear pain, hearing loss, nosebleeds, sinus pain, sore throat and tinnitus.   Eyes: Negative for blurred vision and double vision.  Respiratory: Negative for cough, hemoptysis, sputum production and shortness of breath.   Cardiovascular: Positive for leg swelling. Negative for chest pain and palpitations.  Gastrointestinal: Negative for abdominal pain, blood in stool, constipation, diarrhea, heartburn, melena, nausea and vomiting.       Good appetite. Eating well.  Genitourinary: Positive for frequency. Negative for dysuria,  hematuria and urgency.  Musculoskeletal: Negative for back pain, joint pain, myalgias and neck pain.  Skin: Negative for itching and rash.  Neurological: Negative for dizziness, tingling, sensory change, weakness and headaches.  Endo/Heme/Allergies: Does not bruise/bleed easily.       Diabetes under good control.  Psychiatric/Behavioral: Negative for depression and memory loss. The patient is not nervous/anxious and does not have insomnia.   All other systems reviewed and are negative.  Performance status (ECOG): 2  Vitals Blood pressure (!) 142/71, pulse 67, temperature (!) 96 F (35.6 C), temperature source Tympanic, weight 255 lb 6.4 oz (115.8 kg), SpO2 100 %.   Physical Exam Vitals and nursing note reviewed.  Constitutional:      General: He is not in acute distress.    Appearance: He is not diaphoretic.     Comments: Requires 2 person assistance to get off exam table and back into wheelchair.  HENT:     Head: Normocephalic and atraumatic.     Mouth/Throat:     Mouth: Mucous membranes are moist.     Pharynx: Oropharynx is clear.  Eyes:     General: No scleral icterus.    Extraocular Movements: Extraocular movements  intact.     Conjunctiva/sclera: Conjunctivae normal.     Pupils: Pupils are equal, round, and reactive to light.     Comments: Brown eyes.  Cardiovascular:     Rate and Rhythm: Normal rate and regular rhythm.     Heart sounds: Normal heart sounds. No murmur heard.   Pulmonary:     Effort: Pulmonary effort is normal. No respiratory distress.     Breath sounds: Normal breath sounds. No wheezing or rales.  Chest:     Chest wall: No tenderness.  Abdominal:     General: Bowel sounds are normal. There is no distension.     Palpations: Abdomen is soft. There is no mass.     Tenderness: There is no abdominal tenderness. There is no guarding or rebound.  Musculoskeletal:        General: No swelling or tenderness. Normal range of motion.     Cervical back: Normal range of motion and neck supple.     Right lower leg: Edema (3+ with wrappings) present.     Left lower leg: Edema (3+ with wrappings) present.  Lymphadenopathy:     Head:     Right side of head: No preauricular, posterior auricular or occipital adenopathy.     Left side of head: No preauricular, posterior auricular or occipital adenopathy.     Cervical: No cervical adenopathy.     Upper Body:     Right upper body: No supraclavicular or axillary adenopathy.     Left upper body: No supraclavicular or axillary adenopathy.     Lower Body: No right inguinal adenopathy. No left inguinal adenopathy.  Skin:    General: Skin is warm and dry.  Neurological:     Mental Status: He is alert and oriented to person, place, and time.  Psychiatric:        Behavior: Behavior normal.        Thought Content: Thought content normal.        Judgment: Judgment normal.    No visits with results within 3 Day(s) from this visit.  Latest known visit with results is:  Appointment on 05/30/2020  Component Date Value Ref Range Status  . Prostatic Specific Antigen 05/30/2020 45.03* 0.00 - 4.00 ng/mL Final   Comment: (NOTE) While PSA levels of <=  4.0  ng/ml are reported as reference range, some men with levels below 4.0 ng/ml can have prostate cancer and many men with PSA above 4.0 ng/ml do not have prostate cancer.  Other tests such as free PSA, age specific reference ranges, PSA velocity and PSA doubling time may be helpful especially in men less than 24 years old. Performed at [facility], [place] [address], [place], [place] [address]   . Sodium 05/30/2020 135  135 - 145 mmol/L Final  . Potassium 05/30/2020 3.9  3.5 - 5.1 mmol/L Final  . Chloride 05/30/2020 103  98 - 111 mmol/L Final  . CO2 05/30/2020 25  22 - 32 mmol/L Final  . Glucose, Bld 05/30/2020 233* 70 - 99 mg/dL Final   Glucose reference range applies only to samples taken after fasting for at least 8 hours.  . BUN 05/30/2020 21  8 - 23 mg/dL Final  . Creatinine, Ser 05/30/2020 1.21  0.61 - 1.24 mg/dL Final  . Calcium 05/30/2020 9.1  8.9 - 10.3 mg/dL Final  . Total Protein 05/30/2020 6.6  6.5 - 8.1 g/dL Final  . Albumin 05/30/2020 3.6  3.5 - 5.0 g/dL Final  . AST 05/30/2020 16  15 - 41 U/L Final  . ALT 05/30/2020 22  0 - 44 U/L Final  . Alkaline Phosphatase 05/30/2020 66  38 - 126 U/L Final  . Total Bilirubin 05/30/2020 0.8  0.3 - 1.2 mg/dL Final  . GFR calc non Af Amer 05/30/2020 54* >60 mL/min Final  . GFR calc Af Amer 05/30/2020 >60  >60 mL/min Final  . Anion gap 05/30/2020 7  5 - 15 Final   Performed at [facility], [address] [place], [place] [address]  . WBC 05/30/2020 6.2  4.0 - 10.5 K/uL Final  . RBC 05/30/2020 3.78* 4.22 - 5.81 MIL/uL Final  . Hemoglobin 05/30/2020 10.9* 13.0 - 17.0 g/dL Final  . HCT 05/30/2020 34.0* 39 - 52 % Final  . MCV 05/30/2020 89.9  80.0 - 100.0 fL Final  . MCH 05/30/2020 28.8  26.0 - 34.0 pg Final  . MCHC 05/30/2020 32.1  30.0 - 36.0 g/dL Final  . RDW 05/30/2020 14.3  11.5 - 15.5 % Final  . Platelets 05/30/2020 169  150 - 400 K/uL Final  . nRBC 05/30/2020 0.0  0.0 - 0.2 % Final  . Neutrophils Relative %  05/30/2020 77  % Final  . Neutro Abs 05/30/2020 4.8  1.7 - 7.7 K/uL Final  . Lymphocytes Relative 05/30/2020 15  % Final  . Lymphs Abs 05/30/2020 0.9  0.7 - 4.0 K/uL Final  . Monocytes Relative 05/30/2020 6  % Final  . Monocytes Absolute 05/30/2020 0.4  0 - 1 K/uL Final  . Eosinophils Relative 05/30/2020 2  % Final  . Eosinophils Absolute 05/30/2020 0.1  0 - 0 K/uL Final  . Basophils Relative 05/30/2020 0  % Final  . Basophils Absolute 05/30/2020 0.0  0 - 0 K/uL Final  . Immature Granulocytes 05/30/2020 0  % Final  . Abs Immature Granulocytes 05/30/2020 0.01  0.00 - 0.07 K/uL Final   Performed at [facility], [address], [place], [place] [address]    Assessment:  Brandon Kidane Sr. is a 86 y.o. male with metastatic prostate cancer. He presented with gross hematuria. PSAwas 2241 on 11/06/2019.   Renal stone CTon 11/05/2019 revealed confluentnodular massesadjacent to the prostate and distal sigmoid colon with left pelvic, perirectal, retroperitoneal, and mesenteric adenopathy  as well as probable peritoneal diseasemost marked in the right upper quadrant extending about the right diaphragmatic leaflet. There was pelvic and suspected vertebral osseous metastatic diseasewith L1 compression fracture, likely pathologic.There was a possible 2 cm right lower lobe lung nodule.There was left hydronephrosisand proximal ureterectasis secondary to pelvic process (prior renal ultrasound on 10/16/2019 revealed no hydronephrosis).  Chest CTon 11/08/2019 revealed right pleural effusion with pleural metastasis(2.7 x 2.1 cm nodularity along anterior right hemidiaphragm). Osseous metastasis, including a dominant lesioninvolving the right-side of the L2vertebral body.   Bone scanon 11/08/2019 revealed widespread bony metastasis(right occiput, clavicles, left upper extremity, sternum, ribs bilaterally, throughout the cervical, thoracic, lumbar spine, bony pelvis, proximal  femurs, right tibia). Patient declined biopsyfor NGS.  He received degarelixon 11/11/2019(last 05/30/2020). He began abirateroneon 11/14/2019.  PSAhas been followed: 32.82 on 12/22/2018, 33.08 on 12/30/2018, 122.41 on 05/27/2019, 2241 on 11/06/2019, 216 on 12/09/2019, 80.45 01/06/2020, 54.06 on 02/06/2020, 37.73 on 03/07/2020, 30.57 on 04/09/2020, 35.25 on 05/02/2020, 45.03 on 05/30/2020, and 76.52 on 06/28/2020.  He has a monoclonal gammopathy of unknown significance (MGUS). SPEPon 10/16/2019 revealed a 0.4 gm/dL + an additional 0.2 gm/dL biclonal IgAprotein with kappa specificity. Bone marrowon 11/14/2019 revealed a hypercellular bone marrow for age with metastatic carcinomac/w prostate cancer. There was slight plasmacytosis (4% plasma cells). Plasma cells displayed polyclonal staining for kappa and lambda light chains with kappa light chain excess. Findings were most suggestive of early involvement by a plasma cell neoplasm. Cytogenetics were normal (22, XX). 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  SPEP has been followed (gm/dL):  0.3 on 10/16/2019 and 0.3 on 03/07/2020.  He has a history of DVTin the upper deep calf vein in the region of the venous trifurcation on 07/04/2019.He was on Eliquis,but discontinued secondary to hematuria.IVC filter was placed on 11/07/2019.  He is hepatitis C positive.  Testing occurred on 10/17/2019.  He has stage IIIa chronic kidney disease. Creatinine is 1.4.  He has received both doses of the COVID-19 vaccine on 11/03/2019.   Symptomatically,  he feels "fairly well." He has been struggling with leg swelling and has seen wound care several times. He denies any pain. His appetite is good and he is eating well. He remains at [facility].  Plan: 1.   Labs today: CBC with diff, CMP, PSA. 2. Metastatic prostatic prostate cancer Symptomatically, he appears to be doing well. Hehas extensive pelvic,  perirectal, retroperitoneal and mesenteric adenopathy. Hehas extensive osseous metastasis. Suspect pleural based nodularity is prostate cancer. He is on degarelix and abiraterone + prednisone.     His daughter notes that he has been receiving his abiraterone at [facility].   He denies any side effects associated with his medication.  Review trend in PSA.  Discuss plan for reimaging studies Labs reviewed.  Degarelix today. PSA has decreased from2241 on 01/17/2021to a nadir of 30.57 on 03/2111/2021 and then has subsequently increased. Discuss restaging studies. 3. Bone metastasis Patient plan was to begin monthly Xgeva after dental clearance. Patient's daughter to contact care facility for a dental exam. 4.Hematuria, intermittent No apparent hematuria since last visit. Aspirin, Plavix, and Eliquis held. Patient s/p IVC filter placement on 11/07/2019. Continue to monitor. 5.Normocytic anemia  Hematocrit 34.2.  Hemoglobin 11.2.  MCV 88.6 on 06/28/2020. Hemoglobin has improved over the past few months. Ferritin 549 (acute phase reactant) with iron saturation 15% and TIBC 190on 12/09/2019. B12 and folate were normal on 11/08/2019. 6. Monoclonal gammopathyof unknown significance (MGUS) SPEP on 10/16/2019 revealed a 0.4 gm/dL +  an additional 0.2 gm/dL biclonal IgA protein with kappa specificity. Bone marrow aspirate and biopsy on 11/15/2019 revealed a hypercellular bone marrow with metastatic carcinoma c/w prostate cancer.  There was slight plasmacytosis (4% plasma cells). Plasma cells werepolyclonal. SPEP was 0.3 gm/dL (stable) on 03/07/2020.  Continue surveillance every 6 months. 7. Left lower extremity  DVT Duplex on 07/04/2019 revealedupper deep calf vein in the region of the venous trifurcation. IVC filterwas placed on 11/07/2019. He has chronic lower extremity edema. 8.   Weight loss, improving Patient has gained 1 pound since last visit.  Continue to monitor. 9.   History of hepatitis C  GI consult for consideration of hepatitis C treatment 10.   Degarelix today. 11.   Bone scan on 07/26/2020. 12.   Chest, abdomen and pelvis CT without contrast on 07/26/2020. 13.   GI consult. 14.   RTC in 1 month for MD assessment, labs (CBC with diff, CMP, PSA, testosterone, SPEP), review of imaging studies, and degarelix.  I discussed the assessment and treatment plan with the patient.  The patient was provided an opportunity to ask questions and all were answered.  The patient agreed with the plan and demonstrated an understanding of the instructions.  The patient was advised to call back if the symptoms worsen or if the condition fails to improve as anticipated.  I provided 19 minutes of face-to-face time during this this encounter and > 50% was spent counseling as documented under my assessment and plan.  An additional 8 minutes were spent reviewing his chart (Epic and Care Everywhere) including notes, labs, and imaging studies.    Lequita Asal, MD, PhD    06/28/2020, 9:20 AM  Carleene Cooper, am acting as Education administrator for [employer]. Mike Gip, MD, PhD.  I, Kathleena Freeman C. Mike Gip, MD, have reviewed the above documentation for accuracy and completeness, and I agree with the above.

## 2020-06-28 ENCOUNTER — Inpatient Hospital Stay: Payer: Medicare Other | Attending: Hematology and Oncology

## 2020-06-28 ENCOUNTER — Encounter: Payer: Self-pay | Admitting: Hematology and Oncology

## 2020-06-28 ENCOUNTER — Inpatient Hospital Stay (HOSPITAL_BASED_OUTPATIENT_CLINIC_OR_DEPARTMENT_OTHER): Payer: Medicare Other | Admitting: Hematology and Oncology

## 2020-06-28 ENCOUNTER — Other Ambulatory Visit: Payer: Self-pay

## 2020-06-28 ENCOUNTER — Inpatient Hospital Stay: Payer: Medicare Other

## 2020-06-28 ENCOUNTER — Telehealth: Payer: Self-pay

## 2020-06-28 VITALS — BP 142/71 | HR 67 | Temp 96.0°F | Wt 255.4 lb

## 2020-06-28 DIAGNOSIS — N1831 Chronic kidney disease, stage 3a: Secondary | ICD-10-CM | POA: Diagnosis not present

## 2020-06-28 DIAGNOSIS — G4730 Sleep apnea, unspecified: Secondary | ICD-10-CM | POA: Insufficient documentation

## 2020-06-28 DIAGNOSIS — I10 Essential (primary) hypertension: Secondary | ICD-10-CM | POA: Insufficient documentation

## 2020-06-28 DIAGNOSIS — R768 Other specified abnormal immunological findings in serum: Secondary | ICD-10-CM

## 2020-06-28 DIAGNOSIS — C7951 Secondary malignant neoplasm of bone: Secondary | ICD-10-CM | POA: Insufficient documentation

## 2020-06-28 DIAGNOSIS — D472 Monoclonal gammopathy: Secondary | ICD-10-CM

## 2020-06-28 DIAGNOSIS — Z7952 Long term (current) use of systemic steroids: Secondary | ICD-10-CM | POA: Diagnosis not present

## 2020-06-28 DIAGNOSIS — Z5111 Encounter for antineoplastic chemotherapy: Secondary | ICD-10-CM | POA: Insufficient documentation

## 2020-06-28 DIAGNOSIS — C61 Malignant neoplasm of prostate: Secondary | ICD-10-CM | POA: Diagnosis not present

## 2020-06-28 DIAGNOSIS — I824Y2 Acute embolism and thrombosis of unspecified deep veins of left proximal lower extremity: Secondary | ICD-10-CM | POA: Diagnosis not present

## 2020-06-28 DIAGNOSIS — J449 Chronic obstructive pulmonary disease, unspecified: Secondary | ICD-10-CM | POA: Diagnosis not present

## 2020-06-28 DIAGNOSIS — D649 Anemia, unspecified: Secondary | ICD-10-CM | POA: Diagnosis not present

## 2020-06-28 DIAGNOSIS — Z87891 Personal history of nicotine dependence: Secondary | ICD-10-CM | POA: Diagnosis not present

## 2020-06-28 DIAGNOSIS — Z7901 Long term (current) use of anticoagulants: Secondary | ICD-10-CM | POA: Diagnosis not present

## 2020-06-28 DIAGNOSIS — I252 Old myocardial infarction: Secondary | ICD-10-CM | POA: Diagnosis not present

## 2020-06-28 DIAGNOSIS — R634 Abnormal weight loss: Secondary | ICD-10-CM | POA: Insufficient documentation

## 2020-06-28 DIAGNOSIS — Z7902 Long term (current) use of antithrombotics/antiplatelets: Secondary | ICD-10-CM | POA: Insufficient documentation

## 2020-06-28 DIAGNOSIS — G2581 Restless legs syndrome: Secondary | ICD-10-CM | POA: Insufficient documentation

## 2020-06-28 DIAGNOSIS — E119 Type 2 diabetes mellitus without complications: Secondary | ICD-10-CM | POA: Diagnosis not present

## 2020-06-28 DIAGNOSIS — Z79899 Other long term (current) drug therapy: Secondary | ICD-10-CM | POA: Insufficient documentation

## 2020-06-28 DIAGNOSIS — E785 Hyperlipidemia, unspecified: Secondary | ICD-10-CM | POA: Diagnosis not present

## 2020-06-28 DIAGNOSIS — Z86718 Personal history of other venous thrombosis and embolism: Secondary | ICD-10-CM | POA: Diagnosis not present

## 2020-06-28 DIAGNOSIS — Z794 Long term (current) use of insulin: Secondary | ICD-10-CM | POA: Insufficient documentation

## 2020-06-28 LAB — CBC WITH DIFFERENTIAL/PLATELET
Abs Immature Granulocytes: 0.03 10*3/uL (ref 0.00–0.07)
Basophils Absolute: 0 10*3/uL (ref 0.0–0.1)
Basophils Relative: 0 %
Eosinophils Absolute: 0.1 10*3/uL (ref 0.0–0.5)
Eosinophils Relative: 2 %
HCT: 34.2 % — ABNORMAL LOW (ref 39.0–52.0)
Hemoglobin: 11.2 g/dL — ABNORMAL LOW (ref 13.0–17.0)
Immature Granulocytes: 0 %
Lymphocytes Relative: 18 %
Lymphs Abs: 1.4 10*3/uL (ref 0.7–4.0)
MCH: 29 pg (ref 26.0–34.0)
MCHC: 32.7 g/dL (ref 30.0–36.0)
MCV: 88.6 fL (ref 80.0–100.0)
Monocytes Absolute: 0.6 10*3/uL (ref 0.1–1.0)
Monocytes Relative: 8 %
Neutro Abs: 5.5 10*3/uL (ref 1.7–7.7)
Neutrophils Relative %: 72 %
Platelets: 232 10*3/uL (ref 150–400)
RBC: 3.86 MIL/uL — ABNORMAL LOW (ref 4.22–5.81)
RDW: 13.4 % (ref 11.5–15.5)
WBC: 7.7 10*3/uL (ref 4.0–10.5)
nRBC: 0 % (ref 0.0–0.2)

## 2020-06-28 LAB — COMPREHENSIVE METABOLIC PANEL
ALT: 27 U/L (ref 0–44)
AST: 20 U/L (ref 15–41)
Albumin: 3.5 g/dL (ref 3.5–5.0)
Alkaline Phosphatase: 78 U/L (ref 38–126)
Anion gap: 10 (ref 5–15)
BUN: 21 mg/dL (ref 8–23)
CO2: 33 mmol/L — ABNORMAL HIGH (ref 22–32)
Calcium: 9.6 mg/dL (ref 8.9–10.3)
Chloride: 96 mmol/L — ABNORMAL LOW (ref 98–111)
Creatinine, Ser: 1.35 mg/dL — ABNORMAL HIGH (ref 0.61–1.24)
GFR calc Af Amer: 55 mL/min — ABNORMAL LOW (ref >60)
GFR calc non Af Amer: 47 mL/min — ABNORMAL LOW (ref >60)
Glucose, Bld: 186 mg/dL — ABNORMAL HIGH (ref 70–99)
Potassium: 3.5 mmol/L (ref 3.5–5.1)
Sodium: 139 mmol/L (ref 135–145)
Total Bilirubin: 0.7 mg/dL (ref 0.3–1.2)
Total Protein: 6.9 g/dL (ref 6.5–8.1)

## 2020-06-28 LAB — PSA: Prostatic Specific Antigen: 76.52 ng/mL — ABNORMAL HIGH (ref 0.00–4.00)

## 2020-06-28 MED ORDER — DEGARELIX ACETATE 80 MG ~~LOC~~ SOLR
80.0000 mg | Freq: Once | SUBCUTANEOUS | Status: AC
  Administered 2020-06-28: 80 mg via SUBCUTANEOUS

## 2020-06-28 NOTE — Progress Notes (Signed)
Patient here for oncology follow-up appointment, expresses concerns of chronic leg swelling.

## 2020-07-10 MED FILL — ABIRATERONE ACETATE 250 MG: 250 | 30 days supply | Qty: 120 | Fill #1

## 2020-07-11 ENCOUNTER — Encounter
Admission: RE | Admit: 2020-07-11 | Discharge: 2020-07-11 | Disposition: A | Discharge status: Home / Self Care | Payer: Medicare Other | Source: Ambulatory Visit | Attending: Hematology and Oncology | Admitting: Hematology and Oncology

## 2020-07-11 ENCOUNTER — Other Ambulatory Visit: Payer: Self-pay

## 2020-07-11 DIAGNOSIS — C61 Malignant neoplasm of prostate: Secondary | ICD-10-CM | POA: Insufficient documentation

## 2020-07-11 DIAGNOSIS — C7951 Secondary malignant neoplasm of bone: Secondary | ICD-10-CM | POA: Insufficient documentation

## 2020-07-11 MED ORDER — TECHNETIUM TC 99M MEDRONATE IV KIT
20.0000 | PACK | Freq: Once | INTRAVENOUS | Status: AC | PRN
  Administered 2020-07-11: 23.42 via INTRAVENOUS

## 2020-07-12 ENCOUNTER — Telehealth: Payer: Self-pay

## 2020-07-12 NOTE — Telephone Encounter (Signed)
Patient daughter stated there is no reports of ankle trauma.

## 2020-07-18 ENCOUNTER — Other Ambulatory Visit: Payer: Self-pay

## 2020-07-18 ENCOUNTER — Ambulatory Visit
Admission: RE | Admit: 2020-07-18 | Discharge: 2020-07-18 | Disposition: A | Discharge status: Home / Self Care | Payer: Medicare Other | Source: Ambulatory Visit | Attending: Hematology and Oncology | Admitting: Hematology and Oncology

## 2020-07-18 DIAGNOSIS — C61 Malignant neoplasm of prostate: Secondary | ICD-10-CM | POA: Insufficient documentation

## 2020-07-18 DIAGNOSIS — C7951 Secondary malignant neoplasm of bone: Secondary | ICD-10-CM | POA: Insufficient documentation

## 2020-07-26 ENCOUNTER — Ambulatory Visit: Payer: Medicare Other | Admitting: Hematology and Oncology

## 2020-07-26 ENCOUNTER — Ambulatory Visit: Payer: Medicare Other

## 2020-07-26 ENCOUNTER — Other Ambulatory Visit: Payer: Medicare Other

## 2020-07-31 NOTE — Progress Notes (Signed)
[facility]  [address] [place], [place] [address] Phone: [phone]  Fax: [phone]   Clinic Day:  08/01/2020  Referring physician: [name], PA-C  Chief Complaint: Brandon Busing Sr. is a 86 y.o. male with metastatic prostate cancer on abiraterone and stage III chronic kidney disease who is seen for 5 week assessment, review of imaging, and monthly Degarelix.  HPI: The patient was last seen in the medical oncology clinic on 06/28/2020. At that time, he felt "fairly well." He had been struggling with leg swelling and had seen wound care several times. He denied any pain. His appetite was good; he was eating well. He remained at [facility].  Hematocrit was 34.2, hemoglobin 11.2, MCV 88.6, platelets 232,000, WBC 7,700. Creatinine was 1.35 (CrCl 55 ml/min). PSA was 76.52. He received Degarelix. He wasreferred to GI for consideration of hepatitis C treatment.  Whole body bone scan on 07/11/2020 revealed interval response to therapy with the resolution or varying degrees of diminishing uptake within widespread skeletal metastases noted on prior examination. Left hydronephrosis resolved. There was increased focal uptake within the medial ankles bilaterally, left greater than right, suggestive of interval trauma since prior examination. Correlation with plain film examination may be helpful for further evaluation.  Chest, abdomen and pelvis CT without contrast on 07/18/2020 revealed significant interval reduction in previously seen bulky lymphadenopathy throughout the pelvis and retroperitoneum, c/w treatment response. There was interval increase in extensive sclerotic osseous metastatic disease throughout the included skeleton, likely reflecting post treatment change of existing osseous metastatic disease. Multiple subtle wedge and endplate deformities were unchanged, most notably a wedge deformity of L1 with approximately 50% height loss. There was  prostatomegaly. There was coronary artery disease and aortic atherosclerosis.  During the interim, he has been "alright." He reports urinary frequency and sometimes cannot make it to the bathroom on time. He has been on Lasix for about a month. He is staying in a care facility and they pump his legs twice daily. Last week, his legs were swollen more than normal. He is eating well. He denies any pain, recent falls, nausea, vomiting, and diarrhea.   Past Medical History:  Diagnosis Date  . Anemia, normocytic normochromic   . BPH (benign prostatic hyperplasia)   . CAD (coronary artery disease)   . Cataracts, bilateral   . Chronic renal insufficiency   . Colon polyp   . COPD (chronic obstructive pulmonary disease) ([place])   . Diabetes mellitus without complication ([place])   . DVT (deep venous thrombosis) ([place])   . Edema of both legs   . Glaucoma   . Gout   . Heart murmur   . Hepatitis C antibody test positive 10/20/2019  . Hyperlipidemia   . Hypertension   . Moderate persistent asthma without complication   . Myocardial infarction ([place])   . Neuromuscular disorder ([place])   . Neuropathy   . Peripheral vascular disease ([place])   . Premature ejaculation   . Prostate cancer ([place])   . RLS (restless legs syndrome)   . Sleep apnea   . Stented coronary artery 11/13/2015  . Uncontrolled type 2 diabetes mellitus with stage 3 chronic kidney disease, with long-term current use of insulin ([place])     Past Surgical History:  Procedure Laterality Date  . APPENDECTOMY    . [place]  2009  . COLONOSCOPY  2012  . EYE SURGERY    . IVC FILTER INSERTION N/A 11/07/2019   Procedure: IVC FILTER INSERTION;  Surgeon: Algernon Huxley, MD;  Location: [facility];  Service: Cardiovascular;  Laterality: N/A;  . PROSTATE SURGERY    . TONSILLECTOMY      History reviewed. No pertinent family history.  Social History:  reports that he has quit smoking. He has never used smokeless tobacco. No  history on file for alcohol use and drug use. He previously smoked <1 pack/day x 20 years (stopped smoking with cardiac stent placement). He previously drank liquor on the weekend (stopped 2020). He is retired Dance movement psychotherapist). He previously lived outside of [place]. He then moved to [place] then [place] to live with his daughter [name] [phone]). His wife died of cancer 1 year ago.He is currently living at [facility] assisted living. The patient is accompanied by his daughter, [name] today.  Allergies:  Allergies  Allergen Reactions  . Metformin And Related Diarrhea    Intolerance due to naturally loose bowels    Current Medications: Current Outpatient Medications  Medication Sig Dispense Refill  . abiraterone acetate (ZYTIGA) 250 MG tablet TAKE 4 TABLETS BY MOUTH DAILY ON AN EMPTY STOMACH 1 HOUR BEFORE OR 2 HOURS AFTER MEALS 120 tablet 1  . atorvastatin (LIPITOR) 80 MG tablet Take 80 mg by mouth daily.    . carvedilol (COREG) 25 MG tablet Take 25 mg by mouth 2 (two) times daily with a meal.    . Cholecalciferol (VITAMIN D) 50 MCG (2000 UT) tablet Take 2,000 Units by mouth daily.     . DULoxetine (CYMBALTA) 60 MG capsule Take 60 mg by mouth daily.    . famotidine (PEPCID) 20 MG tablet Take 20 mg by mouth at bedtime as needed for heartburn.     . finasteride (PROSCAR) 5 MG tablet Take 5 mg by mouth daily.    . furosemide (LASIX) 40 MG tablet Take 40 mg by mouth daily.    Marland Kitchen gabapentin (NEURONTIN) 100 MG capsule Take 1 capsule (100 mg total) by mouth 2 (two) times daily. 60 capsule 0  . insulin aspart (NOVOLOG) 100 UNIT/ML injection Inject 5 Units into the skin 3 (three) times daily with meals. 10 mL 11  . Insulin Detemir (LEVEMIR) 100 UNIT/ML Pen Inject 15 Units into the skin daily. 20 mL 0  . lisinopril (ZESTRIL) 10 MG tablet Take 10 mg by mouth daily.    . Multiple Vitamin (MULTI-VITAMIN) tablet Take 1 tablet by mouth daily.    . polyethylene  glycol (MIRALAX / GLYCOLAX) 17 g packet Take 17 g by mouth daily. 14 each 0  . predniSONE (DELTASONE) 5 MG tablet TAKE 1 TABLET BY MOUTH DAILY WITH BREAKFAST. BEGIN TAKING WITH INITIATION OF ABIRATERONE. 30 tablet 0  . senna-docusate (SENOKOT-S) 8.6-50 MG tablet Take 1 tablet by mouth 2 (two) times daily. 10 tablet 0  . traZODone (DESYREL) 50 MG tablet Take 25 mg by mouth at bedtime.    Marland Kitchen UNIFINE SAFECONTROL PEN NEEDLE 30G X 5 MM MISC     . amLODipine (NORVASC) 10 MG tablet Take 10 mg by mouth daily. (Patient not taking: Reported on 08/01/2020)    . feeding supplement, ENSURE ENLIVE, (ENSURE ENLIVE) LIQD Take 237 mLs by mouth 4 (four) times daily -  with meals and at bedtime. (Patient not taking: Reported on 06/28/2020) 237 mL 12  . nitroGLYCERIN (NITROSTAT) 0.4 MG SL tablet Place 0.4 mg under the tongue every 5 (five) minutes as needed for chest pain.  (Patient not taking: Reported on 06/28/2020)    .  sulfamethoxazole-trimethoprim (BACTRIM DS) 800-160 MG tablet Take 1 tablet by mouth 2 (two) times daily. (Patient not taking: Reported on 08/01/2020)     No current facility-administered medications for this visit.    Review of Systems  Constitutional: Positive for weight loss (2 lbs). Negative for chills, diaphoresis, fever and malaise/fatigue.       Feels "alright".  HENT: Negative for congestion, ear discharge, ear pain, hearing loss, nosebleeds, sinus pain, sore throat and tinnitus.   Eyes: Negative for blurred vision and double vision.  Respiratory: Negative for cough, hemoptysis, sputum production and shortness of breath.   Cardiovascular: Positive for leg swelling. Negative for chest pain and palpitations.  Gastrointestinal: Negative for abdominal pain, blood in stool, constipation, diarrhea, heartburn, melena, nausea and vomiting.       Eating well.  Genitourinary: Positive for frequency. Negative for dysuria, hematuria and urgency.  Musculoskeletal: Negative for back pain, joint pain,  myalgias and neck pain.  Skin: Negative for itching and rash.  Neurological: Negative for dizziness, tingling, sensory change, weakness and headaches.  Endo/Heme/Allergies: Does not bruise/bleed easily.       Diabetes under good control.  Psychiatric/Behavioral: Negative for depression and memory loss. The patient is not nervous/anxious and does not have insomnia.   All other systems reviewed and are negative.  Performance status (ECOG): 2  Vitals Blood pressure (!) 148/69, pulse (!) 58, temperature (!) 96.9 F (36.1 C), temperature source Tympanic, weight 253 lb (114.8 kg), SpO2 100 %.   Physical Exam Vitals and nursing note reviewed.  Constitutional:      General: He is not in acute distress.    Appearance: He is not diaphoretic.     Comments: Patient was examined in a wheelchair. He had urinated on himself.  HENT:     Head: Normocephalic and atraumatic.     Mouth/Throat:     Mouth: Mucous membranes are moist.     Pharynx: Oropharynx is clear.  Eyes:     General: No scleral icterus.    Extraocular Movements: Extraocular movements intact.     Conjunctiva/sclera: Conjunctivae normal.     Pupils: Pupils are equal, round, and reactive to light.     Comments: Brown eyes.  Cardiovascular:     Rate and Rhythm: Normal rate and regular rhythm.     Heart sounds: Normal heart sounds. No murmur heard.   Pulmonary:     Effort: Pulmonary effort is normal. No respiratory distress.     Breath sounds: Normal breath sounds. No wheezing or rales.  Chest:     Chest wall: No tenderness.  Abdominal:     General: Bowel sounds are normal. There is no distension.     Palpations: Abdomen is soft. There is no mass.     Tenderness: There is no abdominal tenderness. There is no guarding or rebound.  Musculoskeletal:        General: No swelling or tenderness. Normal range of motion.     Cervical back: Normal range of motion and neck supple.  Lymphadenopathy:     Head:     Right side of head: No  preauricular, posterior auricular or occipital adenopathy.     Left side of head: No preauricular, posterior auricular or occipital adenopathy.     Cervical: No cervical adenopathy.     Upper Body:     Right upper body: No supraclavicular or axillary adenopathy.     Left upper body: No supraclavicular or axillary adenopathy.     Lower Body: No right  inguinal adenopathy. No left inguinal adenopathy.  Skin:    General: Skin is warm and dry.  Neurological:     Mental Status: He is alert and oriented to person, place, and time.  Psychiatric:        Behavior: Behavior normal.        Thought Content: Thought content normal.        Judgment: Judgment normal.    Imaging studies: 11/05/2019:  Renal stone CTrevealed confluentnodular massesadjacent to the prostate and distal sigmoid colon with left pelvic, perirectal, retroperitoneal, and mesenteric adenopathy as well as probable peritoneal diseasemost marked in the right upper quadrant extending about the right diaphragmatic leaflet. There was pelvic and suspected vertebral osseous metastatic diseasewith L1 compression fracture, likely pathologic.There was a possible 2 cm right lower lobe lung nodule.There was left hydronephrosisand proximal ureterectasis secondary to pelvic process (prior renal ultrasound on 10/16/2019 revealed no hydronephrosis). 11/08/2019:  Chest CTrevealed right pleural effusion with pleural metastasis(2.7 x 2.1 cm nodularity along anterior right hemidiaphragm). Osseous metastasis, including a dominant lesioninvolving the right-side of the L2vertebral body.  11/08/2019:  Bone scanrevealed widespread bony metastasis(right occiput, clavicles, left upper extremity, sternum, ribs bilaterally, throughout the cervical, thoracic, lumbar spine, bony pelvis, proximal femurs, right tibia).  07/11/2020:  Bone scan revealed interval response to therapy with the resolution or varying degrees of diminishing uptake within  widespread skeletal metastases noted on prior examination. Left hydronephrosis resolved. There was increased focal uptake within the medial ankles bilaterally, left greater than right, suggestive of interval trauma since prior examination. Correlation with plain film examination may be helpful for further evaluation. 07/18/2020:  Chest, abdomen and pelvis CT without contrast revealed significant interval reduction in previously seen bulky lymphadenopathy throughout the pelvis and retroperitoneum, c/w treatment response. There was interval increase in extensive sclerotic osseous metastatic disease throughout the included skeleton, likely reflecting post treatment change of existing osseous metastatic disease. Multiple subtle wedge and endplate deformities were unchanged, most notably a wedge deformity of L1 with approximately 50% height loss. There was prostatomegaly. There was coronary artery disease and aortic atherosclerosis.   Appointment on 08/01/2020  Component Date Value Ref Range Status  . Sodium 08/01/2020 131* 135 - 145 mmol/L Final  . Potassium 08/01/2020 3.9  3.5 - 5.1 mmol/L Final  . Chloride 08/01/2020 95* 98 - 111 mmol/L Final  . CO2 08/01/2020 26  22 - 32 mmol/L Final  . Glucose, Bld 08/01/2020 305* 70 - 99 mg/dL Final   Glucose reference range applies only to samples taken after fasting for at least 8 hours.  . BUN 08/01/2020 19  8 - 23 mg/dL Final  . Creatinine, Ser 08/01/2020 1.19  0.61 - 1.24 mg/dL Final  . Calcium 08/01/2020 9.2  8.9 - 10.3 mg/dL Final  . Total Protein 08/01/2020 6.8  6.5 - 8.1 g/dL Final  . Albumin 08/01/2020 3.4* 3.5 - 5.0 g/dL Final  . AST 08/01/2020 17  15 - 41 U/L Final  . ALT 08/01/2020 24  0 - 44 U/L Final  . Alkaline Phosphatase 08/01/2020 76  38 - 126 U/L Final  . Total Bilirubin 08/01/2020 0.6  0.3 - 1.2 mg/dL Final  . GFR, Estimated 08/01/2020 55* >60 mL/min Final  . Anion gap 08/01/2020 10  5 - 15 Final   Performed at [facility]  Lab, [address] [place], [place] [address]  . WBC 08/01/2020 6.0  4.0 - 10.5 K/uL Final  . RBC 08/01/2020 3.48* 4.22 - 5.81 MIL/uL Final  .  Hemoglobin 08/01/2020 10.1* 13.0 - 17.0 g/dL Final  . HCT 08/01/2020 31.1* 39 - 52 % Final  . MCV 08/01/2020 89.4  80.0 - 100.0 fL Final  . MCH 08/01/2020 29.0  26.0 - 34.0 pg Final  . MCHC 08/01/2020 32.5  30.0 - 36.0 g/dL Final  . RDW 08/01/2020 13.6  11.5 - 15.5 % Final  . Platelets 08/01/2020 195  150 - 400 K/uL Final  . nRBC 08/01/2020 0.0  0.0 - 0.2 % Final  . Neutrophils Relative % 08/01/2020 71  % Final  . Neutro Abs 08/01/2020 4.2  1.7 - 7.7 K/uL Final  . Lymphocytes Relative 08/01/2020 19  % Final  . Lymphs Abs 08/01/2020 1.2  0.7 - 4.0 K/uL Final  . Monocytes Relative 08/01/2020 8  % Final  . Monocytes Absolute 08/01/2020 0.5  0.1 - 1.0 K/uL Final  . Eosinophils Relative 08/01/2020 2  % Final  . Eosinophils Absolute 08/01/2020 0.1  0.0 - 0.5 K/uL Final  . Basophils Relative 08/01/2020 0  % Final  . Basophils Absolute 08/01/2020 0.0  0.0 - 0.1 K/uL Final  . Immature Granulocytes 08/01/2020 0  % Final  . Abs Immature Granulocytes 08/01/2020 0.02  0.00 - 0.07 K/uL Final   Performed at [facility], [address], [place], [place] [address]    Assessment:  Brandon Laurel Sr. is a 86 y.o. male with metastatic prostate cancer. He presented with gross hematuria. PSAwas 2241 on 11/06/2019.   Renal stone CTon 11/05/2019 revealed confluentnodular massesadjacent to the prostate and distal sigmoid colon with left pelvic, perirectal, retroperitoneal, and mesenteric adenopathy as well as probable peritoneal diseasemost marked in the right upper quadrant extending about the right diaphragmatic leaflet. There was pelvic and suspected vertebral osseous metastatic diseasewith L1 compression fracture, likely pathologic.There was a possible 2 cm right lower lobe lung nodule.There was left hydronephrosisand proximal ureterectasis  secondary to pelvic process (prior renal ultrasound on 10/16/2019 revealed no hydronephrosis).  Chest CTon 11/08/2019 revealed right pleural effusion with pleural metastasis(2.7 x 2.1 cm nodularity along anterior right hemidiaphragm). Osseous metastasis, including a dominant lesioninvolving the right-side of the L2vertebral body.   Bone scanon 11/08/2019 revealed widespread bony metastasis(right occiput, clavicles, left upper extremity, sternum, ribs bilaterally, throughout the cervical, thoracic, lumbar spine, bony pelvis, proximal femurs, right tibia). Patient declined biopsyfor NGS.  He received degarelixon 11/11/2019(last 06/28/2020). He began abirateroneon 11/14/2019.  Bone scan on 07/11/2020 revealed interval response to therapy with the resolution or varying degrees of diminishing uptake within widespread skeletal metastases noted on prior examination. Left hydronephrosis resolved. There was increased focal uptake within the medial ankles bilaterally, left greater than right, suggestive of interval trauma since prior examination. Correlation with plain film examination may be helpful for further evaluation.  Chest, abdomen and pelvis CT without contrast on 07/18/2020 revealed significant interval reduction in previously seen bulky lymphadenopathy throughout the pelvis and retroperitoneum, c/w treatment response. There was interval increase in extensive sclerotic osseous metastatic disease throughout the included skeleton, likely reflecting post treatment change of existing osseous metastatic disease. Multiple subtle wedge and endplate deformities were unchanged, most notably a wedge deformity of L1 with approximately 50% height loss. There was prostatomegaly. There was coronary artery disease and aortic atherosclerosis.  PSAhas been followed: 32.82 on 12/22/2018, 33.08 on 12/30/2018, 122.41 on 05/27/2019, 2241.0 on 11/06/2019, 216 on 12/09/2019, 80.45 01/06/2020, 54.06 on  02/06/2020, 37.73 on 03/07/2020, 30.57 on 04/09/2020, 35.25 on 05/02/2020, 45.03 on 05/30/2020, 76.52 on 06/28/2020, and 104.03 on 08/01/2020.  He has a monoclonal gammopathy of unknown significance (MGUS). SPEPon 10/16/2019 revealed a 0.4 gm/dL + an additional 0.2 gm/dL biclonal IgAprotein with kappa specificity. Bone marrowon 11/14/2019 revealed a hypercellular bone marrow for age with metastatic carcinomac/w prostate cancer. There was slight plasmacytosis (4% plasma cells). Plasma cells displayed polyclonal staining for kappa and lambda light chains with kappa light chain excess. Findings were most suggestive of early involvement by a plasma cell neoplasm. Cytogenetics were normal (48, XX). 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  SPEP has been followed (gm/dL):  0.3 on 10/16/2019, 0.3 on 03/07/2020, and 0.2 on 08/01/2020.  He has a history of DVTin the upper deep calf vein in the region of the venous trifurcation on 07/04/2019.He was on Eliquis,but discontinued secondary to hematuria.IVC filter was placed on 11/07/2019.  He is hepatitis C positive.  Testing occurred on 10/17/2019.  He has stage IIIa chronic kidney disease. Creatinine is 1.4.  He has received both doses of the COVID-19 vaccine on 11/03/2019.   Symptomatically, he feels "alright."  He reports urinary frequency; he has been on Lasix for about a month.  He is eating well. He denies any pain.  Plan: 1.   Labs today: CBC with diff, CMP, PSA, testosterone, SPEP. 2. Metastatic prostatic prostate cancer Symptomatically, he is doing well. Hepresented with extensive pelvic, perirectal, retroperitoneal and mesenteric adenopathy. Hepresented with extensive osseous metastasis. He began on degarelix and abiraterone + prednisone on 11/11/2019.     He has been receiving his abiraterone at [facility].   He denies any side effects associated with his  medications.  Review trend in PSA.  Review bone scan on 07/11/2020 and CT scans on 07/18/2020.   Images personally reviewed.  There has been a significant response to treatment.   Discuss close monitoring of disease secondary to trend in PSA.    Anticipate follow-up imaging in 3 months. Labs reviewed.  Degarelix today. Discuss symptom management.  No interventions are needed.    3. Bone metastasis Review interval bone scan- improved.  Bilateral ankle films today.  Awaiting dental clearance for monthly Xgeva 4.Hematuria, resolved No apparent hematuria since last visit. Aspirin, Plavix, and Eliquis held. Patient s/p IVC filter placement on 11/07/2019. Continue to monitor. 5.Normocytic anemia  Hematocrit 34.2.  Hemoglobin 11.2.  MCV 88.6 on 06/28/2020. Hematocrit 31.1.  Hemoglobin 10.1.  MCV 89.4 on 08/01/2020. Ferritin 549 (acute phase reactant) with iron saturation 15% and TIBC 190on 12/09/2019. B12 and folate were normal on 11/08/2019.  Check additional labs at next visit. 6. Monoclonal gammopathyof unknown significance (MGUS) SPEP on 10/16/2019 revealed a 0.4 gm/dL + an additional 0.2 gm/dL biclonal IgA protein with kappa specificity. Bone marrow aspirate and biopsy on 11/15/2019 revealed a hypercellular bone marrow with metastatic carcinoma c/w prostate cancer.  There was slight plasmacytosis (4% plasma cells). Plasma cells werepolyclonal. SPEP was 0.3 gm/dL on 03/07/2020 and 0.2 gm/dL today.  Monitor every 6 months. 7. Left lower extremity DVT Duplex on 07/04/2019 revealedupper deep calf vein in the region of the venous trifurcation. IVC filterwas placed on 11/07/2019. He has chronic lower extremity edema. 8.   Weight  loss Weight is down 2 pounds.  Etiology may be secondary to diuresis.  Continue to monitor. 9.   History of hepatitis C  GI consult for consideration of hepatitis C treatment.  He has an appointment with Dr Allen Norris on 08/30/2020. 10.   Hyponatremia  Sodium 139 at last visit and 131 today.  Etiology felt secondary to initiation of Lasix  1 month ago.  Send electrolyes to MD prescribing Lasix.  11.   RTC in 1 month for labs (CBC with diff, CMP, PSA), and degarelix. 12.   RTC in 2 months for MD assessment, labs (CBC with diff, CMP, PSA), and degarelix.  I discussed the assessment and treatment plan with the patient.  The patient was provided an opportunity to ask questions and all were answered.  The patient agreed with the plan and demonstrated an understanding of the instructions.  The patient was advised to call back if the symptoms worsen or if the condition fails to improve as anticipated.   Lequita Asal, MD, PhD    08/01/2020, 11:15 AM  Carleene Cooper, am acting as Education administrator for [employer]. Mike Gip, MD, PhD.  I, Angell Pincock C. Mike Gip, MD, have reviewed the above documentation for accuracy and completeness, and I agree with the above. Marland Kitchen

## 2020-08-01 ENCOUNTER — Encounter: Payer: Self-pay | Admitting: Hematology and Oncology

## 2020-08-01 ENCOUNTER — Inpatient Hospital Stay: Payer: Medicare Other

## 2020-08-01 ENCOUNTER — Other Ambulatory Visit: Payer: Self-pay

## 2020-08-01 ENCOUNTER — Telehealth: Payer: Self-pay

## 2020-08-01 ENCOUNTER — Inpatient Hospital Stay: Payer: Medicare Other | Attending: Hematology and Oncology

## 2020-08-01 ENCOUNTER — Inpatient Hospital Stay (HOSPITAL_BASED_OUTPATIENT_CLINIC_OR_DEPARTMENT_OTHER): Payer: Medicare Other | Admitting: Hematology and Oncology

## 2020-08-01 VITALS — BP 148/69 | HR 58 | Temp 96.9°F | Wt 253.0 lb

## 2020-08-01 DIAGNOSIS — E871 Hypo-osmolality and hyponatremia: Secondary | ICD-10-CM

## 2020-08-01 DIAGNOSIS — C61 Malignant neoplasm of prostate: Secondary | ICD-10-CM | POA: Diagnosis present

## 2020-08-01 DIAGNOSIS — I824Y2 Acute embolism and thrombosis of unspecified deep veins of left proximal lower extremity: Secondary | ICD-10-CM | POA: Diagnosis not present

## 2020-08-01 DIAGNOSIS — D649 Anemia, unspecified: Secondary | ICD-10-CM

## 2020-08-01 DIAGNOSIS — Z79899 Other long term (current) drug therapy: Secondary | ICD-10-CM | POA: Insufficient documentation

## 2020-08-01 DIAGNOSIS — Z86718 Personal history of other venous thrombosis and embolism: Secondary | ICD-10-CM | POA: Diagnosis not present

## 2020-08-01 DIAGNOSIS — N1831 Chronic kidney disease, stage 3a: Secondary | ICD-10-CM | POA: Insufficient documentation

## 2020-08-01 DIAGNOSIS — B1920 Unspecified viral hepatitis C without hepatic coma: Secondary | ICD-10-CM | POA: Diagnosis not present

## 2020-08-01 DIAGNOSIS — C7951 Secondary malignant neoplasm of bone: Secondary | ICD-10-CM | POA: Diagnosis present

## 2020-08-01 DIAGNOSIS — Z7189 Other specified counseling: Secondary | ICD-10-CM

## 2020-08-01 DIAGNOSIS — R768 Other specified abnormal immunological findings in serum: Secondary | ICD-10-CM

## 2020-08-01 DIAGNOSIS — D472 Monoclonal gammopathy: Secondary | ICD-10-CM | POA: Diagnosis not present

## 2020-08-01 DIAGNOSIS — R634 Abnormal weight loss: Secondary | ICD-10-CM

## 2020-08-01 DIAGNOSIS — Z794 Long term (current) use of insulin: Secondary | ICD-10-CM | POA: Diagnosis not present

## 2020-08-01 DIAGNOSIS — Z7901 Long term (current) use of anticoagulants: Secondary | ICD-10-CM

## 2020-08-01 DIAGNOSIS — R948 Abnormal results of function studies of other organs and systems: Secondary | ICD-10-CM

## 2020-08-01 LAB — COMPREHENSIVE METABOLIC PANEL
ALT: 24 U/L (ref 0–44)
AST: 17 U/L (ref 15–41)
Albumin: 3.4 g/dL — ABNORMAL LOW (ref 3.5–5.0)
Alkaline Phosphatase: 76 U/L (ref 38–126)
Anion gap: 10 (ref 5–15)
BUN: 19 mg/dL (ref 8–23)
CO2: 26 mmol/L (ref 22–32)
Calcium: 9.2 mg/dL (ref 8.9–10.3)
Chloride: 95 mmol/L — ABNORMAL LOW (ref 98–111)
Creatinine, Ser: 1.19 mg/dL (ref 0.61–1.24)
GFR, Estimated: 55 mL/min — ABNORMAL LOW (ref >60)
Glucose, Bld: 305 mg/dL — ABNORMAL HIGH (ref 70–99)
Potassium: 3.9 mmol/L (ref 3.5–5.1)
Sodium: 131 mmol/L — ABNORMAL LOW (ref 135–145)
Total Bilirubin: 0.6 mg/dL (ref 0.3–1.2)
Total Protein: 6.8 g/dL (ref 6.5–8.1)

## 2020-08-01 LAB — CBC WITH DIFFERENTIAL/PLATELET
Abs Immature Granulocytes: 0.02 10*3/uL (ref 0.00–0.07)
Basophils Absolute: 0 10*3/uL (ref 0.0–0.1)
Basophils Relative: 0 %
Eosinophils Absolute: 0.1 10*3/uL (ref 0.0–0.5)
Eosinophils Relative: 2 %
HCT: 31.1 % — ABNORMAL LOW (ref 39.0–52.0)
Hemoglobin: 10.1 g/dL — ABNORMAL LOW (ref 13.0–17.0)
Immature Granulocytes: 0 %
Lymphocytes Relative: 19 %
Lymphs Abs: 1.2 10*3/uL (ref 0.7–4.0)
MCH: 29 pg (ref 26.0–34.0)
MCHC: 32.5 g/dL (ref 30.0–36.0)
MCV: 89.4 fL (ref 80.0–100.0)
Monocytes Absolute: 0.5 10*3/uL (ref 0.1–1.0)
Monocytes Relative: 8 %
Neutro Abs: 4.2 10*3/uL (ref 1.7–7.7)
Neutrophils Relative %: 71 %
Platelets: 195 10*3/uL (ref 150–400)
RBC: 3.48 MIL/uL — ABNORMAL LOW (ref 4.22–5.81)
RDW: 13.6 % (ref 11.5–15.5)
WBC: 6 10*3/uL (ref 4.0–10.5)
nRBC: 0 % (ref 0.0–0.2)

## 2020-08-01 LAB — PSA: Prostatic Specific Antigen: 104.03 ng/mL — ABNORMAL HIGH (ref 0.00–4.00)

## 2020-08-01 MED ORDER — DEGARELIX ACETATE 80 MG ~~LOC~~ SOLR
80.0000 mg | Freq: Once | SUBCUTANEOUS | Status: AC
  Administered 2020-08-01: 80 mg via SUBCUTANEOUS

## 2020-08-01 NOTE — Progress Notes (Signed)
Patient received prescribed treatment and discharged in stable condition.

## 2020-08-01 NOTE — Telephone Encounter (Signed)
Labs has been faxed to Care facility and faxed to his PCP office. fax conformation has been received.

## 2020-08-02 ENCOUNTER — Other Ambulatory Visit: Payer: Self-pay | Admitting: Hematology and Oncology

## 2020-08-02 DIAGNOSIS — C61 Malignant neoplasm of prostate: Secondary | ICD-10-CM

## 2020-08-02 DIAGNOSIS — C7951 Secondary malignant neoplasm of bone: Secondary | ICD-10-CM

## 2020-08-02 LAB — TESTOSTERONE: Testosterone: <3 ng/dL — ABNORMAL LOW (ref 264–916)

## 2020-08-03 LAB — MULTIPLE MYELOMA PANEL, SERUM
Albumin SerPl Elph-Mcnc: 3.5 g/dL (ref 2.9–4.4)
Albumin/Glob SerPl: 1.3 (ref 0.7–1.7)
Alpha 1: 0.3 g/dL (ref 0.0–0.4)
Alpha2 Glob SerPl Elph-Mcnc: 0.9 g/dL (ref 0.4–1.0)
B-Globulin SerPl Elph-Mcnc: 0.9 g/dL (ref 0.7–1.3)
Gamma Glob SerPl Elph-Mcnc: 0.8 g/dL (ref 0.4–1.8)
Globulin, Total: 2.8 g/dL (ref 2.2–3.9)
IgA: 614 mg/dL — ABNORMAL HIGH (ref 61–437)
IgG (Immunoglobin G), Serum: 555 mg/dL — ABNORMAL LOW (ref 603–1613)
IgM (Immunoglobulin M), Srm: 37 mg/dL (ref 15–143)
M Protein SerPl Elph-Mcnc: 0.2 g/dL — ABNORMAL HIGH
Total Protein ELP: 6.3 g/dL (ref 6.0–8.5)

## 2020-08-06 MED FILL — ABIRATERONE ACETATE 250 MG: 250 | 30 days supply | Qty: 120 | Fill #0

## 2020-08-30 ENCOUNTER — Ambulatory Visit (INDEPENDENT_AMBULATORY_CARE_PROVIDER_SITE_OTHER): Payer: Medicare Other | Admitting: Gastroenterology

## 2020-08-30 ENCOUNTER — Other Ambulatory Visit
Admission: RE | Admit: 2020-08-30 | Discharge: 2020-08-30 | Disposition: A | Discharge status: Home / Self Care | Payer: Medicare Other | Attending: Gastroenterology | Admitting: Gastroenterology

## 2020-08-30 ENCOUNTER — Other Ambulatory Visit: Payer: Self-pay

## 2020-08-30 ENCOUNTER — Encounter: Payer: Self-pay | Admitting: Gastroenterology

## 2020-08-30 VITALS — BP 169/77 | HR 71 | Ht 71.0 in | Wt 247.0 lb

## 2020-08-30 DIAGNOSIS — C61 Malignant neoplasm of prostate: Secondary | ICD-10-CM

## 2020-08-30 DIAGNOSIS — B182 Chronic viral hepatitis C: Secondary | ICD-10-CM | POA: Insufficient documentation

## 2020-08-30 DIAGNOSIS — C7951 Secondary malignant neoplasm of bone: Secondary | ICD-10-CM

## 2020-08-30 LAB — HEPATIC FUNCTION PANEL
ALT: 22 U/L (ref 0–44)
AST: 17 U/L (ref 15–41)
Albumin: 3.4 g/dL — ABNORMAL LOW (ref 3.5–5.0)
Alkaline Phosphatase: 68 U/L (ref 38–126)
Bilirubin, Direct: 0.1 mg/dL (ref 0.0–0.2)
Indirect Bilirubin: 0.6 mg/dL (ref 0.3–0.9)
Total Bilirubin: 0.7 mg/dL (ref 0.3–1.2)
Total Protein: 6.7 g/dL (ref 6.5–8.1)

## 2020-08-30 NOTE — Progress Notes (Signed)
Gastroenterology Consultation  Referring Provider:     Lequita Asal, MD Primary Care Physician:  [name], PA-C Primary Gastroenterologist:  Dr. Allen Norris     Reason for Consultation:     Hepatitis C        HPI:   Brandon Petrovich Sr. is a 86 y.o. y/o male referred for consultation & management of Hepatitis C by Dr. Carlis Abbott, Audrie Lia, PA-C.  This patient was sent to me for evaluation of a positive hepatitis C antibody.  The patient has been diagnosed with metastatic prostate cancer.The patient had imaging that showed:  IMPRESSION: 1. Significant interval reduction in previously seen bulky lymphadenopathy throughout the pelvis and retroperitoneum, consistent with treatment response. 2. Interval increase in extensive sclerotic osseous metastatic disease throughout the included skeleton, likely reflecting post treatment change of existing osseous metastatic disease. Multiple subtle wedge and endplate deformities are unchanged, most notably a wedge deformity of L1 with approximately 50% height loss. 3. Prostatomegaly. 4. Coronary artery disease.  Aortic Atherosclerosis (ICD10-I70.0).  The patient's hepatitis C antibody was positive back in December 2020. The patient has had no further testing for hepatitis C and there is no viral load or genotype.  He denies any history of IV drug use, blood transfusions before 1990, high risk sexual activity or homemade tattoos.  He also denies ever snoring cocaine. The patient's liver enzymes have been normal to date.  Past Medical History:  Diagnosis Date  . Anemia, normocytic normochromic   . BPH (benign prostatic hyperplasia)   . CAD (coronary artery disease)   . Cataracts, bilateral   . Chronic renal insufficiency   . Colon polyp   . COPD (chronic obstructive pulmonary disease) ([place])   . Diabetes mellitus without complication ([place])   . DVT (deep venous thrombosis) ([place])   . Edema of both legs   . Glaucoma   . Gout   . Heart murmur     . Hepatitis C antibody test positive 10/20/2019  . Hyperlipidemia   . Hypertension   . Moderate persistent asthma without complication   . Myocardial infarction ([place])   . Neuromuscular disorder ([place])   . Neuropathy   . Peripheral vascular disease ([place])   . Premature ejaculation   . Prostate cancer ([place])   . RLS (restless legs syndrome)   . Sleep apnea   . Stented coronary artery 11/13/2015  . Uncontrolled type 2 diabetes mellitus with stage 3 chronic kidney disease, with long-term current use of insulin ([place])     Past Surgical History:  Procedure Laterality Date  . APPENDECTOMY    . [place]  2009  . COLONOSCOPY  2012  . EYE SURGERY    . IVC FILTER INSERTION N/A 11/07/2019   Procedure: IVC FILTER INSERTION;  Surgeon: Algernon Huxley, MD;  Location: [facility];  Service: Cardiovascular;  Laterality: N/A;  . PROSTATE SURGERY    . TONSILLECTOMY      Prior to Admission medications   Medication Sig Start Date End Date Taking? Authorizing Provider  abiraterone acetate (ZYTIGA) 250 MG tablet TAKE 4 TABLETS BY MOUTH DAILY ON AN EMPTY STOMACH 1 HOUR BEFORE OR 2 HOURS AFTER MEALS 08/02/20   [place], Lenna Sciara C, MD  amLODipine (NORVASC) 10 MG tablet Take 10 mg by mouth daily. Patient not taking: Reported on 08/01/2020 06/11/20   [provider]  atorvastatin (LIPITOR) 80 MG tablet Take 80 mg by mouth daily. 11/15/15   [provider]  carvedilol (COREG) 25  MG tablet Take 25 mg by mouth 2 (two) times daily with a meal.    [provider]  Cholecalciferol (VITAMIN D) 50 MCG (2000 UT) tablet Take 2,000 Units by mouth daily.     [provider]  DULoxetine (CYMBALTA) 60 MG capsule Take 60 mg by mouth daily.    [provider]  famotidine (PEPCID) 20 MG tablet Take 20 mg by mouth at bedtime as needed for heartburn.  08/26/19 08/25/20  [provider]  feeding supplement, ENSURE ENLIVE, (ENSURE ENLIVE) LIQD Take 237 mLs by  mouth 4 (four) times daily -  with meals and at bedtime. Patient not taking: Reported on 06/28/2020 11/17/19   Fritzi Mandes, MD  finasteride (PROSCAR) 5 MG tablet Take 5 mg by mouth daily. 05/22/14   [provider]  furosemide (LASIX) 40 MG tablet Take 40 mg by mouth daily. 06/18/20   [provider]  gabapentin (NEURONTIN) 100 MG capsule Take 1 capsule (100 mg total) by mouth 2 (two) times daily. 12/15/19   Lavina Hamman, MD  insulin aspart (NOVOLOG) 100 UNIT/ML injection Inject 5 Units into the skin 3 (three) times daily with meals. 11/17/19   Fritzi Mandes, MD  Insulin Detemir (LEVEMIR) 100 UNIT/ML Pen Inject 15 Units into the skin daily. 12/15/19   Lavina Hamman, MD  lisinopril (ZESTRIL) 10 MG tablet Take 10 mg by mouth daily. 07/18/20   [provider]  Multiple Vitamin (MULTI-VITAMIN) tablet Take 1 tablet by mouth daily.    [provider]  nitroGLYCERIN (NITROSTAT) 0.4 MG SL tablet Place 0.4 mg under the tongue every 5 (five) minutes as needed for chest pain.  Patient not taking: Reported on 06/28/2020    [provider]  polyethylene glycol (MIRALAX / GLYCOLAX) 17 g packet Take 17 g by mouth daily. 12/16/19   Lavina Hamman, MD  predniSONE (DELTASONE) 5 MG tablet TAKE 1 TABLET BY MOUTH DAILY WITH BREAKFAST. BEGIN TAKING WITH INITIATION OF ABIRATERONE. 12/06/19   Lequita Asal, MD  senna-docusate (SENOKOT-S) 8.6-50 MG tablet Take 1 tablet by mouth 2 (two) times daily. 12/15/19   Lavina Hamman, MD  sulfamethoxazole-trimethoprim (BACTRIM DS) 800-160 MG tablet Take 1 tablet by mouth 2 (two) times daily. Patient not taking: Reported on 08/01/2020 04/30/20   [provider]  traZODone (DESYREL) 50 MG tablet Take 25 mg by mouth at bedtime. 07/18/20   [provider]  UNIFINE SAFECONTROL PEN NEEDLE 30G X 5 MM [place]  06/26/20   [provider]    No family history on file.   Social History   Tobacco Use  . Smoking status: Former  Research scientist (life sciences)  . Smokeless tobacco: Never Used  Substance Use Topics  . Alcohol use: Not on file  . Drug use: Not on file    Allergies as of 08/30/2020 - Review Complete 08/01/2020  Allergen Reaction Noted  . Metformin and related Diarrhea 02/10/2013    Review of Systems:    All systems reviewed and negative except where noted in HPI.   Physical Exam:  There were no vitals taken for this visit. No LMP for male patient. General:   Alert,  Well-developed, well-nourished, pleasant and cooperative in NAD Head:  Normocephalic and atraumatic. Eyes:  Sclera clear, no icterus.   Conjunctiva pink. Ears:  Normal auditory acuity. Neck:  Supple; no masses or thyromegaly. Lungs:  Respirations even and unlabored.  Clear throughout to auscultation.   No wheezes, crackles, or rhonchi. No acute distress. Heart:  Regular rate and rhythm; no murmurs, clicks, rubs, or gallops. Abdomen:  Normal bowel sounds.  No bruits.  Soft, non-tender and non-distended without masses, hepatosplenomegaly or hernias noted.  No guarding or rebound tenderness.  Negative Carnett sign.   Rectal:  Deferred.  Pulses:  Normal pulses noted. Extremities:  No clubbing or edema.  No cyanosis. Neurologic:  Alert and oriented x3;  grossly normal neurologically. Skin:  Intact without significant lesions or rashes.  No jaundice. Lymph Nodes:  No significant cervical adenopathy. Psych:  Alert and cooperative. Normal mood and affect.  Imaging Studies: No results found.  Assessment and Plan:   Brandon Botz Sr. is a 86 y.o. y/o male who comes in today with a positive hepatitis C antibody done last year.  The patient has metastatic prostate cancer and with normal liver enzymes and a positive antibody without any risk factors is likely that this may be a false Positive test.The patient has also been told that since he is 86 years old and has metastatic prostate cancer treating him for his hepatitis C would not be recommended.  The  patient's daughter had come with him but then went to talk the car and after 20 minutes of waiting for her to return the patient was informed of all of our plans to check his viral load and LFTs today.  The patient has been explained the plan and agrees with it.    Lucilla Lame, MD. [facility] Regal    Note: This dictation was prepared with Dragon dictation along with smaller phrase technology. Any transcriptional errors that result from this process are unintentional.

## 2020-08-31 LAB — HCV RNA QUANT: HCV Quantitative: NOT DETECTED IU/mL (ref >50)

## 2020-09-03 ENCOUNTER — Inpatient Hospital Stay: Payer: Medicare Other | Attending: Hematology and Oncology

## 2020-09-03 ENCOUNTER — Telehealth: Payer: Self-pay

## 2020-09-03 ENCOUNTER — Inpatient Hospital Stay: Payer: Medicare Other

## 2020-09-03 ENCOUNTER — Other Ambulatory Visit: Payer: Self-pay

## 2020-09-03 VITALS — BP 130/75 | HR 58 | Temp 97.5°F | Resp 18

## 2020-09-03 DIAGNOSIS — C61 Malignant neoplasm of prostate: Secondary | ICD-10-CM | POA: Diagnosis present

## 2020-09-03 DIAGNOSIS — C7951 Secondary malignant neoplasm of bone: Secondary | ICD-10-CM | POA: Insufficient documentation

## 2020-09-03 DIAGNOSIS — Z5111 Encounter for antineoplastic chemotherapy: Secondary | ICD-10-CM | POA: Diagnosis present

## 2020-09-03 LAB — CBC WITH DIFFERENTIAL/PLATELET
Abs Immature Granulocytes: 0.02 10*3/uL (ref 0.00–0.07)
Basophils Absolute: 0 10*3/uL (ref 0.0–0.1)
Basophils Relative: 0 %
Eosinophils Absolute: 0.1 10*3/uL (ref 0.0–0.5)
Eosinophils Relative: 2 %
HCT: 31.1 % — ABNORMAL LOW (ref 39.0–52.0)
Hemoglobin: 10.1 g/dL — ABNORMAL LOW (ref 13.0–17.0)
Immature Granulocytes: 0 %
Lymphocytes Relative: 17 %
Lymphs Abs: 1 10*3/uL (ref 0.7–4.0)
MCH: 29 pg (ref 26.0–34.0)
MCHC: 32.5 g/dL (ref 30.0–36.0)
MCV: 89.4 fL (ref 80.0–100.0)
Monocytes Absolute: 0.4 10*3/uL (ref 0.1–1.0)
Monocytes Relative: 7 %
Neutro Abs: 4.5 10*3/uL (ref 1.7–7.7)
Neutrophils Relative %: 74 %
Platelets: 162 10*3/uL (ref 150–400)
RBC: 3.48 MIL/uL — ABNORMAL LOW (ref 4.22–5.81)
RDW: 14.5 % (ref 11.5–15.5)
WBC: 6.1 10*3/uL (ref 4.0–10.5)
nRBC: 0 % (ref 0.0–0.2)

## 2020-09-03 LAB — COMPREHENSIVE METABOLIC PANEL
ALT: 20 U/L (ref 0–44)
AST: 16 U/L (ref 15–41)
Albumin: 3.4 g/dL — ABNORMAL LOW (ref 3.5–5.0)
Alkaline Phosphatase: 67 U/L (ref 38–126)
Anion gap: 7 (ref 5–15)
BUN: 19 mg/dL (ref 8–23)
CO2: 28 mmol/L (ref 22–32)
Calcium: 9.2 mg/dL (ref 8.9–10.3)
Chloride: 100 mmol/L (ref 98–111)
Creatinine, Ser: 1.17 mg/dL (ref 0.61–1.24)
GFR, Estimated: >60 mL/min (ref >60)
Glucose, Bld: 226 mg/dL — ABNORMAL HIGH (ref 70–99)
Potassium: 3.6 mmol/L (ref 3.5–5.1)
Sodium: 135 mmol/L (ref 135–145)
Total Bilirubin: 0.5 mg/dL (ref 0.3–1.2)
Total Protein: 6.3 g/dL — ABNORMAL LOW (ref 6.5–8.1)

## 2020-09-03 LAB — PSA: Prostatic Specific Antigen: 117.35 ng/mL — ABNORMAL HIGH (ref 0.00–4.00)

## 2020-09-03 MED ORDER — DEGARELIX ACETATE 80 MG ~~LOC~~ SOLR
80.0000 mg | Freq: Once | SUBCUTANEOUS | Status: AC
  Administered 2020-09-03: 80 mg via SUBCUTANEOUS

## 2020-09-03 MED ORDER — DEGARELIX ACETATE 80 MG ~~LOC~~ SOLR
SUBCUTANEOUS | Status: AC
  Filled 2020-09-03: qty 4

## 2020-09-03 MED FILL — ABIRATERONE ACETATE 250 MG: 250 | 30 days supply | Qty: 120 | Fill #1

## 2020-09-03 NOTE — Telephone Encounter (Signed)
Called and left a message for call back  

## 2020-09-03 NOTE — Telephone Encounter (Signed)
-----   Message from Lucilla Lame, MD sent at 08/31/2020  6:18 PM EST ----- Let the patient know that his hepatitis C viral level was negative meaning he does not have hepatitis C and needs no further GI workup

## 2020-09-03 NOTE — Progress Notes (Signed)
Patient tolerated injection well; discharged in stable condition.

## 2020-09-04 NOTE — Telephone Encounter (Signed)
Sent mychart message to patient informing him of the results

## 2020-09-26 ENCOUNTER — Other Ambulatory Visit: Payer: Self-pay

## 2020-09-26 DIAGNOSIS — C7951 Secondary malignant neoplasm of bone: Secondary | ICD-10-CM

## 2020-09-26 DIAGNOSIS — C61 Malignant neoplasm of prostate: Secondary | ICD-10-CM

## 2020-09-27 ENCOUNTER — Other Ambulatory Visit: Payer: Self-pay | Admitting: Hematology and Oncology

## 2020-09-27 ENCOUNTER — Other Ambulatory Visit: Payer: Self-pay

## 2020-09-27 DIAGNOSIS — C61 Malignant neoplasm of prostate: Secondary | ICD-10-CM

## 2020-09-27 NOTE — Progress Notes (Incomplete)
[facility]  [address] [place], [place] [address] Phone: [phone]  Fax: [phone]   Clinic Day:  09/27/2020  Referring physician: [name], PA-C  Chief Complaint: Brandon Busing Sr. is a 86 y.o. male with metastatic prostate cancer on abiraterone and stage III chronic kidney disease who is seen for 5 week assessment, review of imaging, and monthly Degarelix.  HPI: The patient was last seen in the medical oncology clinic on 08/01/2020. At that time, he feels "alright."  He reports urinary frequency; he has been on Lasix for about a month.  He is eating well. He denies any pain. Hematocrit was 31.1, hemoglobin 10.1, MCV 89.4, platelets 192,000, WBC 6,000. Sodium was 131. Chloride was 95. Glucose was 305. Albumin was 3.4. CrCl was 55 ml/min. M spike was 0.2 gm/dL. Testosterone was <3. PSA was 104.03. He received Degarelix.  The patient saw Dr. Allen Norris on 08/30/2020 for evaluation of a positive hepatitis C antibody in 09/2019. They discussed that given his age and prostate cancer, treatment for hepatitis C would not be recommended. HCV quant was not detected so he did not need further work-up.  Labs on 09/03/2020 revealed a hematocrit of 31.1, hemoglobin 10.1, platelets 162,000, WBC 6,100. Glucose was 226. Total protein was 6.3. Albumin was 3.4. PSA was 117.35. He received Degarelix.  During the interim, ***   Past Medical History:  Diagnosis Date  . Anemia, normocytic normochromic   . BPH (benign prostatic hyperplasia)   . CAD (coronary artery disease)   . Cataracts, bilateral   . Chronic renal insufficiency   . Colon polyp   . COPD (chronic obstructive pulmonary disease) ([place])   . Diabetes mellitus without complication ([place])   . DVT (deep venous thrombosis) ([place])   . Edema of both legs   . Glaucoma   . Gout   . Heart murmur   . Hepatitis C antibody test positive 10/20/2019  . Hyperlipidemia   . Hypertension   . Moderate  persistent asthma without complication   . Myocardial infarction ([place])   . Neuromuscular disorder ([place])   . Neuropathy   . Peripheral vascular disease ([place])   . Premature ejaculation   . Prostate cancer ([place])   . RLS (restless legs syndrome)   . Sleep apnea   . Stented coronary artery 11/13/2015  . Uncontrolled type 2 diabetes mellitus with stage 3 chronic kidney disease, with long-term current use of insulin ([place])     Past Surgical History:  Procedure Laterality Date  . APPENDECTOMY    . [place]  2009  . COLONOSCOPY  2012  . EYE SURGERY    . IVC FILTER INSERTION N/A 11/07/2019   Procedure: IVC FILTER INSERTION;  Surgeon: Algernon Huxley, MD;  Location: [facility];  Service: Cardiovascular;  Laterality: N/A;  . PROSTATE SURGERY    . TONSILLECTOMY      No family history on file.  Social History:  reports that he has quit smoking. He has never used smokeless tobacco. No history on file for alcohol use and drug use. He previously smoked <1 pack/day x 20 years (stopped smoking with cardiac stent placement). He previously drank liquor on the weekend (stopped 2020). He is retired Dance movement psychotherapist). He previously lived outside of [place]. He then moved to [place] then [place] to live with his daughter [name] [phone]). His wife died of cancer 1 year ago.He is currently living at [facility] assisted living. The patient  is accompanied by his daughter, [name]*** today.  Allergies:  Allergies  Allergen Reactions  . Metformin And Related Diarrhea    Intolerance due to naturally loose bowels    Current Medications: Current Outpatient Medications  Medication Sig Dispense Refill  . abiraterone acetate (ZYTIGA) 250 MG tablet TAKE 4 TABLETS BY MOUTH DAILY ON AN EMPTY STOMACH 1 HOUR BEFORE OR 2 HOURS AFTER MEALS 120 tablet 1  . amLODipine (NORVASC) 10 MG tablet Take 10 mg by mouth daily. (Patient not taking: Reported on 08/01/2020)     . atorvastatin (LIPITOR) 80 MG tablet Take 80 mg by mouth daily.    . carvedilol (COREG) 25 MG tablet Take 25 mg by mouth 2 (two) times daily with a meal.    . Cholecalciferol (VITAMIN D) 50 MCG (2000 UT) tablet Take 2,000 Units by mouth daily.     . DULoxetine (CYMBALTA) 60 MG capsule Take 60 mg by mouth daily.    . famotidine (PEPCID) 20 MG tablet Take 20 mg by mouth at bedtime as needed for heartburn.     . feeding supplement, ENSURE ENLIVE, (ENSURE ENLIVE) LIQD Take 237 mLs by mouth 4 (four) times daily -  with meals and at bedtime. (Patient not taking: Reported on 06/28/2020) 237 mL 12  . finasteride (PROSCAR) 5 MG tablet Take 5 mg by mouth daily.    . furosemide (LASIX) 40 MG tablet Take 40 mg by mouth daily.    Marland Kitchen gabapentin (NEURONTIN) 100 MG capsule Take 1 capsule (100 mg total) by mouth 2 (two) times daily. 60 capsule 0  . insulin aspart (NOVOLOG) 100 UNIT/ML injection Inject 5 Units into the skin 3 (three) times daily with meals. 10 mL 11  . Insulin Detemir (LEVEMIR) 100 UNIT/ML Pen Inject 15 Units into the skin daily. 20 mL 0  . lisinopril (ZESTRIL) 10 MG tablet Take 10 mg by mouth daily.    . Multiple Vitamin (MULTI-VITAMIN) tablet Take 1 tablet by mouth daily.    . nitroGLYCERIN (NITROSTAT) 0.4 MG SL tablet Place 0.4 mg under the tongue every 5 (five) minutes as needed for chest pain.  (Patient not taking: Reported on 06/28/2020)    . polyethylene glycol (MIRALAX / GLYCOLAX) 17 g packet Take 17 g by mouth daily. 14 each 0  . predniSONE (DELTASONE) 5 MG tablet TAKE 1 TABLET BY MOUTH DAILY WITH BREAKFAST. BEGIN TAKING WITH INITIATION OF ABIRATERONE. 30 tablet 0  . senna-docusate (SENOKOT-S) 8.6-50 MG tablet Take 1 tablet by mouth 2 (two) times daily. 10 tablet 0  . sulfamethoxazole-trimethoprim (BACTRIM DS) 800-160 MG tablet Take 1 tablet by mouth 2 (two) times daily. (Patient not taking: Reported on 08/01/2020)    . traZODone (DESYREL) 50 MG tablet Take 25 mg by mouth at bedtime.    Marland Kitchen  UNIFINE SAFECONTROL PEN NEEDLE 30G X 5 MM MISC      No current facility-administered medications for this visit.    Review of Systems  Constitutional: Positive for weight loss (2 lbs). Negative for chills, diaphoresis, fever and malaise/fatigue.       Feels "alright".  HENT: Negative for congestion, ear discharge, ear pain, hearing loss, nosebleeds, sinus pain, sore throat and tinnitus.   Eyes: Negative for blurred vision and double vision.  Respiratory: Negative for cough, hemoptysis, sputum production and shortness of breath.   Cardiovascular: Positive for leg swelling. Negative for chest pain and palpitations.  Gastrointestinal: Negative for abdominal pain, blood in stool, constipation, diarrhea, heartburn, melena, nausea and vomiting.  Eating well.  Genitourinary: Positive for frequency. Negative for dysuria, hematuria and urgency.  Musculoskeletal: Negative for back pain, joint pain, myalgias and neck pain.  Skin: Negative for itching and rash.  Neurological: Negative for dizziness, tingling, sensory change, weakness and headaches.  Endo/Heme/Allergies: Does not bruise/bleed easily.       Diabetes under good control.  Psychiatric/Behavioral: Negative for depression and memory loss. The patient is not nervous/anxious and does not have insomnia.   All other systems reviewed and are negative.  Performance status (ECOG): 2***  Vitals There were no vitals taken for this visit.   Physical Exam Vitals and nursing note reviewed.  Constitutional:      General: He is not in acute distress.    Appearance: He is not diaphoretic.     Comments: Patient was examined in a wheelchair. He had urinated on himself.  HENT:     Head: Normocephalic and atraumatic.     Mouth/Throat:     Mouth: Mucous membranes are moist.     Pharynx: Oropharynx is clear.  Eyes:     General: No scleral icterus.    Extraocular Movements: Extraocular movements intact.     Conjunctiva/sclera: Conjunctivae  normal.     Pupils: Pupils are equal, round, and reactive to light.     Comments: Brown eyes.  Cardiovascular:     Rate and Rhythm: Normal rate and regular rhythm.     Heart sounds: Normal heart sounds. No murmur heard.   Pulmonary:     Effort: Pulmonary effort is normal. No respiratory distress.     Breath sounds: Normal breath sounds. No wheezing or rales.  Chest:     Chest wall: No tenderness.  Breasts:     Right: No axillary adenopathy or supraclavicular adenopathy.     Left: No axillary adenopathy or supraclavicular adenopathy.    Abdominal:     General: Bowel sounds are normal. There is no distension.     Palpations: Abdomen is soft. There is no mass.     Tenderness: There is no abdominal tenderness. There is no guarding or rebound.  Musculoskeletal:        General: No swelling or tenderness. Normal range of motion.     Cervical back: Normal range of motion and neck supple.  Lymphadenopathy:     Head:     Right side of head: No preauricular, posterior auricular or occipital adenopathy.     Left side of head: No preauricular, posterior auricular or occipital adenopathy.     Cervical: No cervical adenopathy.     Upper Body:     Right upper body: No supraclavicular or axillary adenopathy.     Left upper body: No supraclavicular or axillary adenopathy.     Lower Body: No right inguinal adenopathy. No left inguinal adenopathy.  Skin:    General: Skin is warm and dry.  Neurological:     Mental Status: He is alert and oriented to person, place, and time.  Psychiatric:        Behavior: Behavior normal.        Thought Content: Thought content normal.        Judgment: Judgment normal.    Imaging studies: 11/05/2019:  Renal stone CTrevealed confluentnodular massesadjacent to the prostate and distal sigmoid colon with left pelvic, perirectal, retroperitoneal, and mesenteric adenopathy as well as probable peritoneal diseasemost marked in the right upper quadrant extending  about the right diaphragmatic leaflet. There was pelvic and suspected vertebral osseous metastatic diseasewith L1 compression fracture,  likely pathologic.There was a possible 2 cm right lower lobe lung nodule.There was left hydronephrosisand proximal ureterectasis secondary to pelvic process (prior renal ultrasound on 10/16/2019 revealed no hydronephrosis). 11/08/2019:  Chest CTrevealed right pleural effusion with pleural metastasis(2.7 x 2.1 cm nodularity along anterior right hemidiaphragm). Osseous metastasis, including a dominant lesioninvolving the right-side of the L2vertebral body.  11/08/2019:  Bone scanrevealed widespread bony metastasis(right occiput, clavicles, left upper extremity, sternum, ribs bilaterally, throughout the cervical, thoracic, lumbar spine, bony pelvis, proximal femurs, right tibia).  07/11/2020:  Bone scan revealed interval response to therapy with the resolution or varying degrees of diminishing uptake within widespread skeletal metastases noted on prior examination. Left hydronephrosis resolved. There was increased focal uptake within the medial ankles bilaterally, left greater than right, suggestive of interval trauma since prior examination. Correlation with plain film examination may be helpful for further evaluation. 07/18/2020:  Chest, abdomen and pelvis CT without contrast revealed significant interval reduction in previously seen bulky lymphadenopathy throughout the pelvis and retroperitoneum, c/w treatment response. There was interval increase in extensive sclerotic osseous metastatic disease throughout the included skeleton, likely reflecting post treatment change of existing osseous metastatic disease. Multiple subtle wedge and endplate deformities were unchanged, most notably a wedge deformity of L1 with approximately 50% height loss. There was prostatomegaly. There was coronary artery disease and aortic atherosclerosis.   No visits with results within 3  Day(s) from this visit.  Latest known visit with results is:  Appointment on 09/03/2020  Component Date Value Ref Range Status  . Sodium 09/03/2020 135  135 - 145 mmol/L Final  . Potassium 09/03/2020 3.6  3.5 - 5.1 mmol/L Final  . Chloride 09/03/2020 100  98 - 111 mmol/L Final  . CO2 09/03/2020 28  22 - 32 mmol/L Final  . Glucose, Bld 09/03/2020 226* 70 - 99 mg/dL Final   Glucose reference range applies only to samples taken after fasting for at least 8 hours.  . BUN 09/03/2020 19  8 - 23 mg/dL Final  . Creatinine, Ser 09/03/2020 1.17  0.61 - 1.24 mg/dL Final  . Calcium 09/03/2020 9.2  8.9 - 10.3 mg/dL Final  . Total Protein 09/03/2020 6.3* 6.5 - 8.1 g/dL Final  . Albumin 09/03/2020 3.4* 3.5 - 5.0 g/dL Final  . AST 09/03/2020 16  15 - 41 U/L Final  . ALT 09/03/2020 20  0 - 44 U/L Final  . Alkaline Phosphatase 09/03/2020 67  38 - 126 U/L Final  . Total Bilirubin 09/03/2020 0.5  0.3 - 1.2 mg/dL Final  . GFR, Estimated 09/03/2020 >60  >60 mL/min Final   Comment: (NOTE) Calculated using the CKD-EPI Creatinine Equation (2021)   . Anion gap 09/03/2020 7  5 - 15 Final   Performed at [facility] [address], [place], [place] [address]  . WBC 09/03/2020 6.1  4.0 - 10.5 K/uL Final  . RBC 09/03/2020 3.48* 4.22 - 5.81 MIL/uL Final  . Hemoglobin 09/03/2020 10.1* 13.0 - 17.0 g/dL Final  . HCT 09/03/2020 31.1* 39.0 - 52.0 % Final  . MCV 09/03/2020 89.4  80.0 - 100.0 fL Final  . MCH 09/03/2020 29.0  26.0 - 34.0 pg Final  . MCHC 09/03/2020 32.5  30.0 - 36.0 g/dL Final  . RDW 09/03/2020 14.5  11.5 - 15.5 % Final  . Platelets 09/03/2020 162  150 - 400 K/uL Final  . nRBC 09/03/2020 0.0  0.0 - 0.2 % Final  . Neutrophils Relative % 09/03/2020 74  % Final  .  Neutro Abs 09/03/2020 4.5  1.7 - 7.7 K/uL Final  . Lymphocytes Relative 09/03/2020 17  % Final  . Lymphs Abs 09/03/2020 1.0  0.7 - 4.0 K/uL Final  . Monocytes Relative 09/03/2020 7  % Final  . Monocytes Absolute 09/03/2020 0.4   0.1 - 1.0 K/uL Final  . Eosinophils Relative 09/03/2020 2  % Final  . Eosinophils Absolute 09/03/2020 0.1  0.0 - 0.5 K/uL Final  . Basophils Relative 09/03/2020 0  % Final  . Basophils Absolute 09/03/2020 0.0  0.0 - 0.1 K/uL Final  . Immature Granulocytes 09/03/2020 0  % Final  . Abs Immature Granulocytes 09/03/2020 0.02  0.00 - 0.07 K/uL Final   Performed at [facility], [address], [place], [place] [address]  . Prostatic Specific Antigen 09/03/2020 117.35* 0.00 - 4.00 ng/mL Final   Comment: (NOTE) While PSA levels of <=4.0 ng/ml are reported as reference range, some men with levels below 4.0 ng/ml can have prostate cancer and many men with PSA above 4.0 ng/ml do not have prostate cancer.  Other tests such as free PSA, age specific reference ranges, PSA velocity and PSA doubling time may be helpful especially in men less than 25 years old. Performed at [facility], [place] [address] [place], [place] [address]     Assessment:  Zylan Almquist. is a 86 y.o. male with metastatic prostate cancer. He presented with gross hematuria. PSAwas 2241 on 11/06/2019.   Renal stone CTon 11/05/2019 revealed confluentnodular massesadjacent to the prostate and distal sigmoid colon with left pelvic, perirectal, retroperitoneal, and mesenteric adenopathy as well as probable peritoneal diseasemost marked in the right upper quadrant extending about the right diaphragmatic leaflet. There was pelvic and suspected vertebral osseous metastatic diseasewith L1 compression fracture, likely pathologic.There was a possible 2 cm right lower lobe lung nodule.There was left hydronephrosisand proximal ureterectasis secondary to pelvic process (prior renal ultrasound on 10/16/2019 revealed no hydronephrosis).  Chest CTon 11/08/2019 revealed right pleural effusion with pleural metastasis(2.7 x 2.1 cm nodularity along anterior right hemidiaphragm). Osseous metastasis, including a  dominant lesioninvolving the right-side of the L2vertebral body.   Bone scanon 11/08/2019 revealed widespread bony metastasis(right occiput, clavicles, left upper extremity, sternum, ribs bilaterally, throughout the cervical, thoracic, lumbar spine, bony pelvis, proximal femurs, right tibia). Patient declined biopsyfor NGS.  He received degarelixon 11/11/2019(last 06/28/2020). He began abirateroneon 11/14/2019.  Bone scan on 07/11/2020 revealed interval response to therapy with the resolution or varying degrees of diminishing uptake within widespread skeletal metastases noted on prior examination. Left hydronephrosis resolved. There was increased focal uptake within the medial ankles bilaterally, left greater than right, suggestive of interval trauma since prior examination. Correlation with plain film examination may be helpful for further evaluation.  Chest, abdomen and pelvis CT without contrast on 07/18/2020 revealed significant interval reduction in previously seen bulky lymphadenopathy throughout the pelvis and retroperitoneum, c/w treatment response. There was interval increase in extensive sclerotic osseous metastatic disease throughout the included skeleton, likely reflecting post treatment change of existing osseous metastatic disease. Multiple subtle wedge and endplate deformities were unchanged, most notably a wedge deformity of L1 with approximately 50% height loss. There was prostatomegaly. There was coronary artery disease and aortic atherosclerosis.  PSAhas been followed: 32.82 on 12/22/2018, 33.08 on 12/30/2018, 122.41 on 05/27/2019, 2241.0 on 11/06/2019, 216 on 12/09/2019, 80.45 01/06/2020, 54.06 on 02/06/2020, 37.73 on 03/07/2020, 30.57 on 04/09/2020, 35.25 on 05/02/2020, 45.03 on 05/30/2020, 76.52 on 06/28/2020, and 104.03 on 08/01/2020.   He has  a monoclonal gammopathy of unknown significance (MGUS). SPEPon 10/16/2019 revealed a 0.4 gm/dL + an additional 0.2 gm/dL  biclonal IgAprotein with kappa specificity. Bone marrowon 11/14/2019 revealed a hypercellular bone marrow for age with metastatic carcinomac/w prostate cancer. There was slight plasmacytosis (4% plasma cells). Plasma cells displayed polyclonal staining for kappa and lambda light chains with kappa light chain excess. Findings were most suggestive of early involvement by a plasma cell neoplasm. Cytogenetics were normal (66, XX). 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  SPEP has been followed (gm/dL):  0.3 on 10/16/2019, 0.3 on 03/07/2020, and 0.2 on 08/01/2020.  He has a history of DVTin the upper deep calf vein in the region of the venous trifurcation on 07/04/2019.He was on Eliquis,but discontinued secondary to hematuria.IVC filter was placed on 11/07/2019.  He is hepatitis C positive.  Testing occurred on 10/17/2019.  He has stage IIIa chronic kidney disease. Creatinine is 1.4.  He has received both doses of the COVID-19 vaccine on 11/03/2019.   Symptomatically, ***  Plan: 1.   Labs today: CBC with diff, CMP, PSA   2. Metastatic prostatic prostate cancer Symptomatically, he is doing well. Hepresented with extensive pelvic, perirectal, retroperitoneal and mesenteric adenopathy. Hepresented with extensive osseous metastasis. He began on degarelix and abiraterone + prednisone on 11/11/2019.     He has been receiving his [facility]ir[facility]one at [facility].   He denies any side effects associated with his medications.  Review trend in PSA.  Review bone scan on 07/11/2020 and CT scans on 07/18/2020.   Images personally reviewed.  There has been a significant response to treatment.   Discuss close monitoring of disease secondary to trend in PSA.    Anticipate follow-up imaging in 3 months. Labs reviewed.  Degarelix today. Discuss symptom management.  No interventions are needed.    3. Bone  metastasis Review interval bone scan- improved.  Bilateral ankle films today.  Awaiting dental clearance for monthly Xgeva 4.Hematuria, resolved No apparent hematuria since last visit. Aspirin, Plavix, and Eliquis held. Patient s/p IVC filter placement on 11/07/2019. Continue to monitor. 5.Normocytic anemia  Hematocrit 34.2.  Hemoglobin 11.2.  MCV 88.6 on 06/28/2020. Hematocrit 31.1.  Hemoglobin 10.1.  MCV 89.4 on 08/01/2020. Ferritin 549 (acute phase reactant) with iron saturation 15% and TIBC 190on 12/09/2019. B12 and folate were normal on 11/08/2019.  Check additional labs at next visit. 6. Monoclonal gammopathyof unknown significance (MGUS) SPEP on 10/16/2019 revealed a 0.4 gm/dL + an additional 0.2 gm/dL biclonal IgA protein with kappa specificity. Bone marrow aspirate and biopsy on 11/15/2019 revealed a hypercellular bone marrow with metastatic carcinoma c/w prostate cancer.  There was slight plasmacytosis (4% plasma cells). Plasma cells werepolyclonal. SPEP was 0.3 gm/dL on 03/07/2020 and 0.2 gm/dL today.  Monitor every 6 months. 7. Left lower extremity DVT Duplex on 07/04/2019 revealedupper deep calf vein in the region of the venous trifurcation. IVC filterwas placed on 11/07/2019. He has chronic lower extremity edema. 8.   Weight loss Weight is down 2 pounds.  Etiology may be secondary to diuresis.  Continue to monitor. 9.   History of hepatitis C  GI consult for consideration of hepatitis C treatment.  He has an appointment with Dr Allen Norris on 08/30/2020. 10.   Hyponatremia  Sodium 139 at last visit and 131 today.  Etiology felt secondary to initiation of Lasix 1 month ago.  Send electrolyes to MD prescribing Lasix.  11.   RTC in 1 month for  labs (CBC with diff, CMP, PSA), and  degarelix. 12.   RTC in 2 months for MD assessment, labs (CBC with diff, CMP, PSA), and degarelix.  I discussed the assessment and treatment plan with the patient.  The patient was provided an opportunity to ask questions and all were answered.  The patient agreed with the plan and demonstrated an understanding of the instructions.  The patient was advised to call back if the symptoms worsen or if the condition fails to improve as anticipated.  I provided *** minutes of face-to-face time during this this encounter and > 50% was spent counseling as documented under my assessment and plan.  Lequita Asal, MD, PhD    09/27/2020, 10:19 AM  Carleene Cooper, am acting as scribe for [employer]. Mike Gip, MD, PhD.  I, Melissa C. Mike Gip, MD, have reviewed the above documentation for accuracy and completeness, and I agree with the above. Marland Kitchen

## 2020-10-01 ENCOUNTER — Inpatient Hospital Stay: Payer: Medicare Other | Admitting: Hematology and Oncology

## 2020-10-01 ENCOUNTER — Inpatient Hospital Stay: Payer: Medicare Other

## 2020-10-01 ENCOUNTER — Other Ambulatory Visit: Payer: Self-pay | Admitting: Hematology and Oncology

## 2020-10-01 DIAGNOSIS — D649 Anemia, unspecified: Secondary | ICD-10-CM

## 2020-10-03 MED FILL — ABIRATERONE ACETATE 250 MG: 250 | 30 days supply | Qty: 120 | Fill #0

## 2020-10-09 NOTE — Progress Notes (Signed)
[facility]  [address], [address] [place], [place] [address] Phone: [phone]  Fax: [phone]   Clinic Day:  10/11/2020  Referring physician: [name], PA-C  Chief Complaint: [name] is a 86 y.o. male with metastatic prostate cancer on abiraterone and stage III chronic kidney disease who is seen for 5 week assessment, review of imaging, and monthly Degarelix.  HPI: The patient was last seen in the medical oncology clinic on 08/01/2020. At that time, he feels "alright."  He reported urinary frequency; he had been on Lasix for about a month.  He was eating well. He denied any pain. Bone scan revealed response to therapy with resolution or decreased uptake in widespread skeletal metastasis.  CT scans revealed significant reduction in Bulky adenopathy in the pelvis and retroperitoneum.  Hematocrit was 31.1, hemoglobin 10.1, MCV 89.4, platelets 192,000, WBC 6,000.  Sodium was 131. Glucose was 305. Protein was 6.8 and albumin 3.4. Creatinine was 1.19.  SPEP revealed a 0.2 gm/dL IgA monoclonal protein with kappa light chain specificity. Testosterone was <3. PSA was 104.03. He received Degarelix.  The patient saw Dr. Allen Norris on 08/30/2020 for evaluation of a positive hepatitis C antibody in 09/2019. They discussed that given his age and prostate cancer, treatment for hepatitis C would not be recommended. HCV quant was not detected so he did not need further work-up.  Labs on 09/03/2020 revealed a hematocrit of 31.1, hemoglobin 10.1, platelets 162,000, WBC 6,100. Glucose was 226.  PSA was 117.35. He received Degarelix.  During the interim, he has been "fine." Everything is going well. He is staying at [facility]. He is moving around better. He needs assistance with bathing but is able to use the restroom on his own. He is still taking abiraterone.  He denies any pain. His leg swelling has improved. His Lasix dose was reduced to 20 mg which has improved  his urinary frequency. He was also put on a medication for bladder control. His diabetes is stable.   Past Medical History:  Diagnosis Date  . Anemia, normocytic normochromic   . BPH (benign prostatic hyperplasia)   . CAD (coronary artery disease)   . Cataracts, bilateral   . Chronic renal insufficiency   . Colon polyp   . COPD (chronic obstructive pulmonary disease) ([place])   . Diabetes mellitus without complication ([place])   . DVT (deep venous thrombosis) ([place])   . Edema of both legs   . Glaucoma   . Gout   . Heart murmur   . Hepatitis C antibody test positive 10/20/2019  . Hyperlipidemia   . Hypertension   . Moderate persistent asthma without complication   . Myocardial infarction ([place])   . Neuromuscular disorder ([place])   . Neuropathy   . Peripheral vascular disease ([place])   . Premature ejaculation   . Prostate cancer ([place])   . RLS (restless legs syndrome)   . Sleep apnea   . Stented coronary artery 11/13/2015  . Uncontrolled type 2 diabetes mellitus with stage 3 chronic kidney disease, with long-term current use of insulin ([place])     Past Surgical History:  Procedure Laterality Date  . APPENDECTOMY    . [place]  2009  . COLONOSCOPY  2012  . EYE SURGERY    . IVC FILTER INSERTION N/A 11/07/2019   Procedure: IVC FILTER INSERTION;  Surgeon: Algernon Huxley, MD;  Location: [facility];  Service: Cardiovascular;  Laterality: N/A;  . PROSTATE SURGERY    .  TONSILLECTOMY      History reviewed. No pertinent family history.  Social History:  reports that he has quit smoking. He has never used smokeless tobacco. No history on file for alcohol use and drug use. He previously smoked <1 pack/day x 20 years (stopped smoking with cardiac stent placement). He previously drank liquor on the weekend (stopped 2020). He is retired Dance movement psychotherapist). He previously lived outside of [place]. He then moved to [place] then [place] to live with his daughter  Lymon Kidney [phone]). His wife died of cancer 1 year ago.He is currently living at [facility] assisted living. The patient is accompanied by his daughter, [name] today.  Allergies:  Allergies  Allergen Reactions  . Metformin And Related Diarrhea    Intolerance due to naturally loose bowels    Current Medications: Current Outpatient Medications  Medication Sig Dispense Refill  . abiraterone acetate (ZYTIGA) 250 MG tablet TAKE 4 TABLETS BY MOUTH DAILY ON AN EMPTY STOMACH 1 HOUR BEFORE OR 2 HOURS AFTER MEALS 120 tablet 1  . atorvastatin (LIPITOR) 80 MG tablet Take 80 mg by mouth daily.    . carvedilol (COREG) 25 MG tablet Take 25 mg by mouth 2 (two) times daily with a meal.    . Cholecalciferol (VITAMIN D) 50 MCG (2000 UT) tablet Take 2,000 Units by mouth daily.     . DULoxetine (CYMBALTA) 60 MG capsule Take 60 mg by mouth daily.    . famotidine (PEPCID) 20 MG tablet Take 20 mg by mouth at bedtime as needed for heartburn.     . furosemide (LASIX) 40 MG tablet Take 40 mg by mouth daily.    Marland Kitchen gabapentin (NEURONTIN) 100 MG capsule Take 1 capsule (100 mg total) by mouth 2 (two) times daily. 60 capsule 0  . insulin aspart (NOVOLOG) 100 UNIT/ML injection Inject 5 Units into the skin 3 (three) times daily with meals. 10 mL 11  . Insulin Detemir (LEVEMIR) 100 UNIT/ML Pen Inject 15 Units into the skin daily. 20 mL 0  . lisinopril (ZESTRIL) 10 MG tablet Take 10 mg by mouth daily.    . Multiple Vitamin (MULTI-VITAMIN) tablet Take 1 tablet by mouth daily.    . polyethylene glycol (MIRALAX / GLYCOLAX) 17 g packet Take 17 g by mouth daily. 14 each 0  . predniSONE (DELTASONE) 5 MG tablet TAKE 1 TABLET BY MOUTH DAILY WITH BREAKFAST. BEGIN TAKING WITH INITIATION OF ABIRATERONE. 30 tablet 0  . senna-docusate (SENOKOT-S) 8.6-50 MG tablet Take 1 tablet by mouth 2 (two) times daily. 10 tablet 0  . traZODone (DESYREL) 50 MG tablet Take 25 mg by mouth at bedtime.    Marland Kitchen UNIFINE SAFECONTROL PEN NEEDLE  30G X 5 MM MISC     . amLODipine (NORVASC) 10 MG tablet Take 10 mg by mouth daily. (Patient not taking: No sig reported)    . feeding supplement, ENSURE ENLIVE, (ENSURE ENLIVE) LIQD Take 237 mLs by mouth 4 (four) times daily -  with meals and at bedtime. (Patient not taking: No sig reported) 237 mL 12  . finasteride (PROSCAR) 5 MG tablet Take 5 mg by mouth daily.    . nitroGLYCERIN (NITROSTAT) 0.4 MG SL tablet Place 0.4 mg under the tongue every 5 (five) minutes as needed for chest pain.  (Patient not taking: No sig reported)    . sulfamethoxazole-trimethoprim (BACTRIM DS) 800-160 MG tablet Take 1 tablet by mouth 2 (two) times daily. (Patient not taking: No sig reported)  No current facility-administered medications for this visit.    Review of Systems  Constitutional: Negative for chills, diaphoresis, fever, malaise/fatigue and weight loss (no new weight).       Feels "fine." Moving around better. Needs assistance with some ADLs.  HENT: Negative for congestion, ear discharge, ear pain, hearing loss, nosebleeds, sinus pain, sore throat and tinnitus.   Eyes: Negative for blurred vision and double vision.  Respiratory: Negative for cough, hemoptysis, sputum production and shortness of breath.   Cardiovascular: Positive for leg swelling (improved). Negative for chest pain and palpitations.  Gastrointestinal: Negative for abdominal pain, blood in stool, constipation, diarrhea, heartburn, melena, nausea and vomiting.  Genitourinary: Positive for frequency (improved). Negative for dysuria, hematuria and urgency.  Musculoskeletal: Negative for back pain, joint pain, myalgias and neck pain.  Skin: Negative for itching and rash.  Neurological: Negative for dizziness, tingling, sensory change, weakness and headaches.  Endo/Heme/Allergies: Does not bruise/bleed easily.       Diabetes under good control.  Psychiatric/Behavioral: Negative for depression and memory loss. The patient is not  nervous/anxious and does not have insomnia.   All other systems reviewed and are negative.  Performance status (ECOG): 2-3  Vitals Blood pressure 119/72, pulse 63, temperature 97.7 F (36.5 C), temperature source Tympanic, resp. rate 18, height 5\' 11"  (1.803 m), SpO2 100 %.   Physical Exam Vitals and nursing note reviewed.  Constitutional:      General: He is not in acute distress.    Appearance: He is not diaphoretic.     Comments: Patient examined in a wheelchair.  HENT:     Head: Normocephalic and atraumatic.     Mouth/Throat:     Mouth: Mucous membranes are moist.     Pharynx: Oropharynx is clear.  Eyes:     General: No scleral icterus.    Extraocular Movements: Extraocular movements intact.     Conjunctiva/sclera: Conjunctivae normal.     Pupils: Pupils are equal, round, and reactive to light.     Comments: Brown eyes.  Cardiovascular:     Rate and Rhythm: Normal rate and regular rhythm.     Heart sounds: Normal heart sounds. No murmur heard.   Pulmonary:     Effort: Pulmonary effort is normal. No respiratory distress.     Breath sounds: Normal breath sounds. No wheezing or rales.  Chest:     Chest wall: No tenderness.  Breasts:     Right: No axillary adenopathy or supraclavicular adenopathy.     Left: No axillary adenopathy or supraclavicular adenopathy.    Abdominal:     General: Bowel sounds are normal. There is no distension.     Palpations: Abdomen is soft. There is no mass.     Tenderness: There is no abdominal tenderness. There is no guarding or rebound.  Musculoskeletal:        General: No swelling or tenderness. Normal range of motion.     Cervical back: Normal range of motion and neck supple.  Lymphadenopathy:     Head:     Right side of head: No preauricular, posterior auricular or occipital adenopathy.     Left side of head: No preauricular, posterior auricular or occipital adenopathy.     Cervical: No cervical adenopathy.     Upper Body:      Right upper body: No supraclavicular or axillary adenopathy.     Left upper body: No supraclavicular or axillary adenopathy.     Lower Body: No right inguinal adenopathy. No left  inguinal adenopathy.  Skin:    General: Skin is warm and dry.  Neurological:     Mental Status: He is alert and oriented to person, place, and time.  Psychiatric:        Behavior: Behavior normal.        Thought Content: Thought content normal.        Judgment: Judgment normal.    Imaging studies: 11/05/2019:  Renal stone CTrevealed confluentnodular massesadjacent to the prostate and distal sigmoid colon with left pelvic, perirectal, retroperitoneal, and mesenteric adenopathy as well as probable peritoneal diseasemost marked in the right upper quadrant extending about the right diaphragmatic leaflet. There was pelvic and suspected vertebral osseous metastatic diseasewith L1 compression fracture, likely pathologic.There was a possible 2 cm right lower lobe lung nodule.There was left hydronephrosisand proximal ureterectasis secondary to pelvic process (prior renal ultrasound on 10/16/2019 revealed no hydronephrosis). 11/08/2019:  Chest CTrevealed right pleural effusion with pleural metastasis(2.7 x 2.1 cm nodularity along anterior right hemidiaphragm). Osseous metastasis, including a dominant lesioninvolving the right-side of the L2vertebral body.  11/08/2019:  Bone scanrevealed widespread bony metastasis(right occiput, clavicles, left upper extremity, sternum, ribs bilaterally, throughout the cervical, thoracic, lumbar spine, bony pelvis, proximal femurs, right tibia).  07/11/2020:  Bone scan revealed interval response to therapy with the resolution or varying degrees of diminishing uptake within widespread skeletal metastases noted on prior examination. Left hydronephrosis resolved. There was increased focal uptake within the medial ankles bilaterally, left greater than right, suggestive of interval trauma  since prior examination. Correlation with plain film examination may be helpful for further evaluation. 07/18/2020:  Chest, abdomen and pelvis CT without contrast revealed significant interval reduction in previously seen bulky lymphadenopathy throughout the pelvis and retroperitoneum, c/w treatment response. There was interval increase in extensive sclerotic osseous metastatic disease throughout the included skeleton, likely reflecting post treatment change of existing osseous metastatic disease. Multiple subtle wedge and endplate deformities were unchanged, most notably a wedge deformity of L1 with approximately 50% height loss. There was prostatomegaly. There was coronary artery disease and aortic atherosclerosis.   Appointment on 10/11/2020  Component Date Value Ref Range Status  . WBC 10/11/2020 7.3  4.0 - 10.5 K/uL Final  . RBC 10/11/2020 3.68* 4.22 - 5.81 MIL/uL Final  . Hemoglobin 10/11/2020 10.7* 13.0 - 17.0 g/dL Final  . HCT 10/11/2020 32.8* 39.0 - 52.0 % Final  . MCV 10/11/2020 89.1  80.0 - 100.0 fL Final  . MCH 10/11/2020 29.1  26.0 - 34.0 pg Final  . MCHC 10/11/2020 32.6  30.0 - 36.0 g/dL Final  . RDW 10/11/2020 14.1  11.5 - 15.5 % Final  . Platelets 10/11/2020 186  150 - 400 K/uL Final  . nRBC 10/11/2020 0.0  0.0 - 0.2 % Final  . Neutrophils Relative % 10/11/2020 72  % Final  . Neutro Abs 10/11/2020 5.3  1.7 - 7.7 K/uL Final  . Lymphocytes Relative 10/11/2020 19  % Final  . Lymphs Abs 10/11/2020 1.4  0.7 - 4.0 K/uL Final  . Monocytes Relative 10/11/2020 7  % Final  . Monocytes Absolute 10/11/2020 0.5  0.1 - 1.0 K/uL Final  . Eosinophils Relative 10/11/2020 2  % Final  . Eosinophils Absolute 10/11/2020 0.2  0.0 - 0.5 K/uL Final  . Basophils Relative 10/11/2020 0  % Final  . Basophils Absolute 10/11/2020 0.0  0.0 - 0.1 K/uL Final  . Immature Granulocytes 10/11/2020 0  % Final  . Abs Immature Granulocytes 10/11/2020 0.03  0.00 - 0.07 K/uL Final  Performed at [facility], [address] [place], [place] [address]  . Sodium 10/11/2020 138  135 - 145 mmol/L Final  . Potassium 10/11/2020 3.7  3.5 - 5.1 mmol/L Final  . Chloride 10/11/2020 100  98 - 111 mmol/L Final  . CO2 10/11/2020 30  22 - 32 mmol/L Final  . Glucose, Bld 10/11/2020 162* 70 - 99 mg/dL Final   Glucose reference range applies only to samples taken after fasting for at least 8 hours.  . BUN 10/11/2020 19  8 - 23 mg/dL Final  . Creatinine, Ser 10/11/2020 1.19  0.61 - 1.24 mg/dL Final  . Calcium 10/11/2020 9.3  8.9 - 10.3 mg/dL Final  . Total Protein 10/11/2020 6.4* 6.5 - 8.1 g/dL Final  . Albumin 10/11/2020 3.4* 3.5 - 5.0 g/dL Final  . AST 10/11/2020 18  15 - 41 U/L Final  . ALT 10/11/2020 20  0 - 44 U/L Final  . Alkaline Phosphatase 10/11/2020 82  38 - 126 U/L Final  . Total Bilirubin 10/11/2020 0.7  0.3 - 1.2 mg/dL Final  . GFR, Estimated 10/11/2020 59* >60 mL/min Final   Comment: (NOTE) Calculated using the CKD-EPI Creatinine Equation (2021)   . Anion gap 10/11/2020 8  5 - 15 Final   Performed at [facility], [address], [place], [place] [address]    Assessment:  Brandon Mey Sr. is a 86 y.o. male with metastatic prostate cancer. He presented with gross hematuria. PSAwas 2241 on 11/06/2019.   Renal stone CTon 11/05/2019 revealed confluentnodular massesadjacent to the prostate and distal sigmoid colon with left pelvic, perirectal, retroperitoneal, and mesenteric adenopathy as well as probable peritoneal diseasemost marked in the right upper quadrant extending about the right diaphragmatic leaflet. There was pelvic and suspected vertebral osseous metastatic diseasewith L1 compression fracture, likely pathologic.There was a possible 2 cm right lower lobe lung nodule.There was left hydronephrosisand proximal ureterectasis secondary to pelvic process (prior renal ultrasound on 10/16/2019 revealed no hydronephrosis).  Chest CTon 11/08/2019 revealed  right pleural effusion with pleural metastasis(2.7 x 2.1 cm nodularity along anterior right hemidiaphragm). Osseous metastasis, including a dominant lesioninvolving the right-side of the L2vertebral body.   Bone scanon 11/08/2019 revealed widespread bony metastasis(right occiput, clavicles, left upper extremity, sternum, ribs bilaterally, throughout the cervical, thoracic, lumbar spine, bony pelvis, proximal femurs, right tibia). Patient declined biopsyfor NGS.  He received degarelixon 11/11/2019(last 06/28/2020). He began abirateroneon 11/14/2019.  Bone scan on 07/11/2020 revealed interval response to therapy with the resolution or varying degrees of diminishing uptake within widespread skeletal metastases noted on prior examination. Left hydronephrosis resolved. There was increased focal uptake within the medial ankles bilaterally, left greater than right, suggestive of interval trauma since prior examination. Correlation with plain film examination may be helpful for further evaluation.  Chest, abdomen and pelvis CT without contrast on 07/18/2020 revealed significant interval reduction in previously seen bulky lymphadenopathy throughout the pelvis and retroperitoneum, c/w treatment response. There was interval increase in extensive sclerotic osseous metastatic disease throughout the included skeleton, likely reflecting post treatment change of existing osseous metastatic disease. Multiple subtle wedge and endplate deformities were unchanged, most notably a wedge deformity of L1 with approximately 50% height loss. There was prostatomegaly. There was coronary artery disease and aortic atherosclerosis.  PSAhas been followed: 32.82 on 12/22/2018, 33.08 on 12/30/2018, 122.41 on 05/27/2019, 2241.0 on 11/06/2019, 216 on 12/09/2019, 80.45 01/06/2020, 54.06 on 02/06/2020, 37.73 on 03/07/2020, 30.57 on 04/09/2020, 35.25 on 05/02/2020, 45.03 on 05/30/2020, 76.52 on  06/28/2020, 104.03 on  08/01/2020, 117.35 on 09/03/2020, and 244.0 on 10/11/2020.  Testosterone < 3 on 08/01/2020.   He has a monoclonal gammopathy of unknown significance (MGUS). SPEPon 10/16/2019 revealed a 0.4 gm/dL + an additional 0.2 gm/dL biclonal IgAprotein with kappa specificity. Bone marrowon 11/14/2019 revealed a hypercellular bone marrow for age with metastatic carcinomac/w prostate cancer. There was slight plasmacytosis (4% plasma cells). Plasma cells displayed polyclonal staining for kappa and lambda light chains with kappa light chain excess. Findings were most suggestive of early involvement by a plasma cell neoplasm. Cytogenetics were normal (58, XX). 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  SPEP has been followed (gm/dL):  0.3 on 10/16/2019, 0.3 on 03/07/2020, and 0.2 on 08/01/2020.  He has a history of DVTin the upper deep calf vein in the region of the venous trifurcation on 07/04/2019.He was on Eliquis,but discontinued secondary to hematuria.IVC filter was placed on 11/07/2019.  He is hepatitis C positive.  Testing occurred on 10/17/2019.  Hepatitis C quantitative was not detected.  Given his age and prostate cancer, treatment for hepatitis C was not recommended.  He has stage IIIa chronic kidney disease. Creatinine is 1.4.  He has received both doses of the COVID-19 vaccine on 11/03/2019.   Symptomatically, he has been "fine."  He continues to stay at [facility].  He denies any concerns.  Exam is stable.  Plan: 1.   Labs today: CBC with diff, CMP, PSA, ferritin, iron studies, B12, folate, TSH, retic. 2. Metastatic prostatic prostate cancer Symptomatically, he continues to do well. Hepresented with extensive pelvic, perirectal, retroperitoneal and mesenteric adenopathy. Hepresented with extensive osseous metastasis. He began degarelix and abiraterone + prednisone on 11/11/2019.     He has been receiving abiraterone  at [facility].   He denies any side effects.  Bone scan on 07/11/2020 and CT scans on 07/18/2020 revealed a significant response to treatment.  Discuss ongoing concern about upward trend in PSA.  Review plan for follow-up imaging prior to next appointment.  Discuss consideration of switch from degarelix to Lupron for convenience.   Patient declines. Labs reviewed.  Degarelix today. Discuss symptom management.  No intervention needed.    3. Bone metastasis Patient with known bone metastasis.  Awaiting dental clearance for initiation of Xgeva. 4.Normocytic anemia  Hematocrit 34.2.  Hemoglobin 11.2.  MCV 88.6 on 06/28/2020. Hematocrit 31.1.  Hemoglobin 10.1.  MCV 89.4 on 08/01/2020.  Hematocrit 32.8.  Hemoglobin 10.7.  MCV 89.1 on 10/11/2020.  Ferritin 288 with iron saturation 24% and TIBC 230 today. B12 494 and folate 28.0 (normal) today.  Continue to monitor. 5. Monoclonal gammopathyof unknown significance (MGUS) SPEP on 10/16/2019 revealed a 0.4 gm/dL + an additional 0.2 gm/dL biclonal IgA protein with kappa specificity. Bone marrow aspirate and biopsy on 11/15/2019 revealed a hypercellular bone marrow with metastatic carcinoma c/w prostate cancer.  There was slight plasmacytosis (4% plasma cells). Plasma cells werepolyclonal. SPEP was 0.3 gm/dL on 03/07/2020 and 0.2 gm/dL on 08/01/2020.  Next check in 01/2021. 6. Left lower extremity DVT Duplex on 07/04/2019 revealedupper deep calf vein in the region of the venous trifurcation. IVC filterwas placed on 11/07/2019. He is not on anticoagulation secondary to hematuria.  He has chronic lower extremity changes. 7.  History of hepatitis C  Patient seen by Dr Allen Norris on 08/30/2020.  Note reviewed.   Given his age and prostate cancer, treatment for  hepatitis C was not recommended.   HCV quant was not detected. 8.   Degarelix today. 9.  Chest, abdomen, pelvis CT on 11/05/2020. 10.   Bone scan on 11/05/2020. 11.   RTC in 4 weeks for MD assessment, labs (CBC with diff, CMP, PSA), review of imaging, and Degarelix.  I discussed the assessment and treatment plan with the patient.  The patient was provided an opportunity to ask questions and all were answered.  The patient agreed with the plan and demonstrated an understanding of the instructions.  The patient was advised to call back if the symptoms worsen or if the condition fails to improve as anticipated.   Lequita Asal, MD, PhD    10/11/2020, 9:49 AM  Carleene Cooper, am acting as scribe for [employer]. Mike Gip, MD, PhD.  I, Johnni Wunschel C. Mike Gip, MD, have reviewed the above documentation for accuracy and completeness, and I agree with the above.

## 2020-10-11 ENCOUNTER — Inpatient Hospital Stay: Payer: Medicare Other | Attending: Hematology and Oncology

## 2020-10-11 ENCOUNTER — Inpatient Hospital Stay: Payer: Medicare Other

## 2020-10-11 ENCOUNTER — Encounter: Payer: Self-pay | Admitting: Hematology and Oncology

## 2020-10-11 ENCOUNTER — Inpatient Hospital Stay (HOSPITAL_BASED_OUTPATIENT_CLINIC_OR_DEPARTMENT_OTHER): Payer: Medicare Other | Admitting: Hematology and Oncology

## 2020-10-11 ENCOUNTER — Other Ambulatory Visit: Payer: Self-pay

## 2020-10-11 VITALS — BP 119/72 | HR 63 | Temp 97.7°F | Resp 18 | Ht 71.0 in

## 2020-10-11 VITALS — BP 119/72 | HR 63 | Temp 97.7°F

## 2020-10-11 DIAGNOSIS — D472 Monoclonal gammopathy: Secondary | ICD-10-CM | POA: Diagnosis not present

## 2020-10-11 DIAGNOSIS — D649 Anemia, unspecified: Secondary | ICD-10-CM | POA: Insufficient documentation

## 2020-10-11 DIAGNOSIS — C61 Malignant neoplasm of prostate: Secondary | ICD-10-CM

## 2020-10-11 DIAGNOSIS — N183 Chronic kidney disease, stage 3 unspecified: Secondary | ICD-10-CM | POA: Diagnosis not present

## 2020-10-11 DIAGNOSIS — D473 Essential (hemorrhagic) thrombocythemia: Secondary | ICD-10-CM | POA: Insufficient documentation

## 2020-10-11 DIAGNOSIS — R768 Other specified abnormal immunological findings in serum: Secondary | ICD-10-CM

## 2020-10-11 DIAGNOSIS — Z87891 Personal history of nicotine dependence: Secondary | ICD-10-CM | POA: Diagnosis not present

## 2020-10-11 DIAGNOSIS — Z7189 Other specified counseling: Secondary | ICD-10-CM

## 2020-10-11 DIAGNOSIS — Z79899 Other long term (current) drug therapy: Secondary | ICD-10-CM | POA: Insufficient documentation

## 2020-10-11 DIAGNOSIS — C7951 Secondary malignant neoplasm of bone: Secondary | ICD-10-CM | POA: Insufficient documentation

## 2020-10-11 DIAGNOSIS — I824Y2 Acute embolism and thrombosis of unspecified deep veins of left proximal lower extremity: Secondary | ICD-10-CM | POA: Diagnosis not present

## 2020-10-11 DIAGNOSIS — Z86718 Personal history of other venous thrombosis and embolism: Secondary | ICD-10-CM | POA: Insufficient documentation

## 2020-10-11 LAB — CBC WITH DIFFERENTIAL/PLATELET
Abs Immature Granulocytes: 0.03 10*3/uL (ref 0.00–0.07)
Basophils Absolute: 0 10*3/uL (ref 0.0–0.1)
Basophils Relative: 0 %
Eosinophils Absolute: 0.2 10*3/uL (ref 0.0–0.5)
Eosinophils Relative: 2 %
HCT: 32.8 % — ABNORMAL LOW (ref 39.0–52.0)
Hemoglobin: 10.7 g/dL — ABNORMAL LOW (ref 13.0–17.0)
Immature Granulocytes: 0 %
Lymphocytes Relative: 19 %
Lymphs Abs: 1.4 10*3/uL (ref 0.7–4.0)
MCH: 29.1 pg (ref 26.0–34.0)
MCHC: 32.6 g/dL (ref 30.0–36.0)
MCV: 89.1 fL (ref 80.0–100.0)
Monocytes Absolute: 0.5 10*3/uL (ref 0.1–1.0)
Monocytes Relative: 7 %
Neutro Abs: 5.3 10*3/uL (ref 1.7–7.7)
Neutrophils Relative %: 72 %
Platelets: 186 10*3/uL (ref 150–400)
RBC: 3.68 MIL/uL — ABNORMAL LOW (ref 4.22–5.81)
RDW: 14.1 % (ref 11.5–15.5)
WBC: 7.3 10*3/uL (ref 4.0–10.5)
nRBC: 0 % (ref 0.0–0.2)

## 2020-10-11 LAB — FOLATE: Folate: 28 ng/mL (ref >5.9)

## 2020-10-11 LAB — COMPREHENSIVE METABOLIC PANEL
ALT: 20 U/L (ref 0–44)
AST: 18 U/L (ref 15–41)
Albumin: 3.4 g/dL — ABNORMAL LOW (ref 3.5–5.0)
Alkaline Phosphatase: 82 U/L (ref 38–126)
Anion gap: 8 (ref 5–15)
BUN: 19 mg/dL (ref 8–23)
CO2: 30 mmol/L (ref 22–32)
Calcium: 9.3 mg/dL (ref 8.9–10.3)
Chloride: 100 mmol/L (ref 98–111)
Creatinine, Ser: 1.19 mg/dL (ref 0.61–1.24)
GFR, Estimated: 59 mL/min — ABNORMAL LOW (ref >60)
Glucose, Bld: 162 mg/dL — ABNORMAL HIGH (ref 70–99)
Potassium: 3.7 mmol/L (ref 3.5–5.1)
Sodium: 138 mmol/L (ref 135–145)
Total Bilirubin: 0.7 mg/dL (ref 0.3–1.2)
Total Protein: 6.4 g/dL — ABNORMAL LOW (ref 6.5–8.1)

## 2020-10-11 LAB — IRON AND TIBC
Iron: 54 ug/dL (ref 45–182)
Saturation Ratios: 24 % (ref 17.9–39.5)
TIBC: 230 ug/dL — ABNORMAL LOW (ref 250–450)
UIBC: 176 ug/dL

## 2020-10-11 LAB — RETICULOCYTES
Immature Retic Fract: 13.5 % (ref 2.3–15.9)
RBC.: 3.54 MIL/uL — ABNORMAL LOW (ref 4.22–5.81)
Retic Count, Absolute: 55.2 10*3/uL (ref 19.0–186.0)
Retic Ct Pct: 1.6 % (ref 0.4–3.1)

## 2020-10-11 LAB — FERRITIN: Ferritin: 288 ng/mL (ref 24–336)

## 2020-10-11 LAB — VITAMIN B12: Vitamin B-12: 494 pg/mL (ref 180–914)

## 2020-10-11 LAB — PSA: Prostatic Specific Antigen: 244 ng/mL — ABNORMAL HIGH (ref 0.00–4.00)

## 2020-10-11 LAB — TSH: TSH: 2.079 u[IU]/mL (ref 0.350–4.500)

## 2020-10-11 MED ORDER — DEGARELIX ACETATE 80 MG ~~LOC~~ SOLR
80.0000 mg | Freq: Once | SUBCUTANEOUS | Status: AC
  Administered 2020-10-11: 11:00:00 80 mg via SUBCUTANEOUS

## 2020-10-11 MED ORDER — DEGARELIX ACETATE 80 MG ~~LOC~~ SOLR
SUBCUTANEOUS | Status: AC
  Filled 2020-10-11: qty 4

## 2020-10-11 NOTE — Progress Notes (Signed)
No new changes noted today 

## 2020-11-02 ENCOUNTER — Telehealth: Payer: Self-pay | Admitting: Pharmacy Technician

## 2020-11-02 NOTE — Telephone Encounter (Signed)
Oral Oncology Patient Advocate Encounter  Richardson called to let me know that the patients grant for his Fabio Asa has expired.    I called the patients daughter, [name], and have emailed her the application for [employer] patient assistance.  She will complete the application and send to me via fax or email.  Once the application is fully complete I will fax to [employer].  [place] Patient [place] Phone [phone] Fax [phone] 11/02/2020 4:25 PM

## 2020-11-05 ENCOUNTER — Ambulatory Visit: Payer: Medicare Other

## 2020-11-07 NOTE — Telephone Encounter (Signed)
Oral Oncology Patient Advocate Encounter  Patients daughter, [name], emailed signed application for [employer] Patient Assistance in an effort to reduce patient's out of pocket expense for Zytiga to $0.    Application completed and faxed to [phone].   Wynetta Emery and [place] ([employer]) phone number for follow up is [phone].   This encounter will be updated until final determination.   [place] Patient [place] Phone [phone] Fax [phone] 11/07/2020 10:51 AM

## 2020-11-08 ENCOUNTER — Inpatient Hospital Stay: Payer: Medicare Other

## 2020-11-08 ENCOUNTER — Inpatient Hospital Stay: Payer: Medicare Other | Admitting: Hematology and Oncology

## 2020-11-10 ENCOUNTER — Encounter: Payer: Self-pay | Admitting: Emergency Medicine

## 2020-11-10 ENCOUNTER — Other Ambulatory Visit: Payer: Self-pay

## 2020-11-10 ENCOUNTER — Emergency Department
Admission: EM | Admit: 2020-11-10 | Discharge: 2020-11-10 | Disposition: A | Discharge status: Home / Self Care | Payer: Medicare Other | Attending: Emergency Medicine | Admitting: Emergency Medicine

## 2020-11-10 DIAGNOSIS — I2510 Atherosclerotic heart disease of native coronary artery without angina pectoris: Secondary | ICD-10-CM | POA: Insufficient documentation

## 2020-11-10 DIAGNOSIS — I130 Hypertensive heart and chronic kidney disease with heart failure and stage 1 through stage 4 chronic kidney disease, or unspecified chronic kidney disease: Secondary | ICD-10-CM | POA: Insufficient documentation

## 2020-11-10 DIAGNOSIS — I5032 Chronic diastolic (congestive) heart failure: Secondary | ICD-10-CM | POA: Insufficient documentation

## 2020-11-10 DIAGNOSIS — Z794 Long term (current) use of insulin: Secondary | ICD-10-CM | POA: Insufficient documentation

## 2020-11-10 DIAGNOSIS — J449 Chronic obstructive pulmonary disease, unspecified: Secondary | ICD-10-CM | POA: Insufficient documentation

## 2020-11-10 DIAGNOSIS — Z87891 Personal history of nicotine dependence: Secondary | ICD-10-CM | POA: Insufficient documentation

## 2020-11-10 DIAGNOSIS — E1143 Type 2 diabetes mellitus with diabetic autonomic (poly)neuropathy: Secondary | ICD-10-CM | POA: Diagnosis not present

## 2020-11-10 DIAGNOSIS — E162 Hypoglycemia, unspecified: Secondary | ICD-10-CM

## 2020-11-10 DIAGNOSIS — E1122 Type 2 diabetes mellitus with diabetic chronic kidney disease: Secondary | ICD-10-CM | POA: Diagnosis not present

## 2020-11-10 DIAGNOSIS — Z79899 Other long term (current) drug therapy: Secondary | ICD-10-CM | POA: Diagnosis not present

## 2020-11-10 DIAGNOSIS — E11649 Type 2 diabetes mellitus with hypoglycemia without coma: Secondary | ICD-10-CM | POA: Diagnosis present

## 2020-11-10 DIAGNOSIS — J4540 Moderate persistent asthma, uncomplicated: Secondary | ICD-10-CM | POA: Diagnosis not present

## 2020-11-10 DIAGNOSIS — N183 Chronic kidney disease, stage 3 unspecified: Secondary | ICD-10-CM | POA: Insufficient documentation

## 2020-11-10 LAB — BASIC METABOLIC PANEL
Anion gap: 11 (ref 5–15)
BUN: 14 mg/dL (ref 8–23)
CO2: 28 mmol/L (ref 22–32)
Calcium: 9.2 mg/dL (ref 8.9–10.3)
Chloride: 98 mmol/L (ref 98–111)
Creatinine, Ser: 1.16 mg/dL (ref 0.61–1.24)
GFR, Estimated: >60 mL/min (ref >60)
Glucose, Bld: 261 mg/dL — ABNORMAL HIGH (ref 70–99)
Potassium: 3.6 mmol/L (ref 3.5–5.1)
Sodium: 137 mmol/L (ref 135–145)

## 2020-11-10 LAB — CBC
HCT: 32.6 % — ABNORMAL LOW (ref 39.0–52.0)
Hemoglobin: 10.6 g/dL — ABNORMAL LOW (ref 13.0–17.0)
MCH: 28.8 pg (ref 26.0–34.0)
MCHC: 32.5 g/dL (ref 30.0–36.0)
MCV: 88.6 fL (ref 80.0–100.0)
Platelets: 193 10*3/uL (ref 150–400)
RBC: 3.68 MIL/uL — ABNORMAL LOW (ref 4.22–5.81)
RDW: 13.3 % (ref 11.5–15.5)
WBC: 6.6 10*3/uL (ref 4.0–10.5)
nRBC: 0 % (ref 0.0–0.2)

## 2020-11-10 LAB — CBG MONITORING, ED
Glucose-Capillary: 254 mg/dL — ABNORMAL HIGH (ref 70–99)
Glucose-Capillary: 164 mg/dL — ABNORMAL HIGH (ref 70–99)

## 2020-11-10 NOTE — Discharge Instructions (Addendum)
Continue your normal diabetes medication regimen.  Make sure you are eating regularly throughout the day.  Return to the ER for new, worsening, or recurrent episodes of confusion, weakness, low blood sugar, or any other new or worsening symptoms that concern you.

## 2020-11-10 NOTE — ED Notes (Signed)
Call daughter with pts dispo

## 2020-11-10 NOTE — ED Triage Notes (Addendum)
Pt via EMS from [facility]. Per facility, pt was acting lethargicso nurses assumed pt was hypoglycemic. They gave him breakfast. EMS checked it and it was 180. Pt did not received his insulin this morning. Pt states he thinks he got his long acting insulin last night. Pt is A&Ox4 and NAD. Denies CP/SOB.

## 2020-11-10 NOTE — ED Provider Notes (Signed)
[facility] Emergency Department Provider Note ____________________________________________   Event Date/Time   First MD Initiated Contact with Patient 11/10/20 2049     (approximate)  I have reviewed the triage vital signs and the nursing notes.   HISTORY  Chief Complaint Hypoglycemia    HPI Brandon Strycharz Sr. is a 87 y.o. male with PMH as noted below including diabetes on insulin who presents after an episode of lethargy and possible hypoglycemia this morning.  The patient states that he felt lightheaded and weak and fell but did not hit his head or injure himself.  He was given breakfast, and when EMS checked his blood sugar was 180.  He states that since he arrived earlier today he has felt fine.  He denies any persistent weakness or lightheadedness, nausea or vomiting, shortness of breath, fever, or other acute symptoms.   Past Medical History:  Diagnosis Date  . Anemia, normocytic normochromic   . BPH (benign prostatic hyperplasia)   . CAD (coronary artery disease)   . Cataracts, bilateral   . Chronic renal insufficiency   . Colon polyp   . COPD (chronic obstructive pulmonary disease) ([place])   . Diabetes mellitus without complication ([place])   . DVT (deep venous thrombosis) ([place])   . Edema of both legs   . Glaucoma   . Gout   . Heart murmur   . Hepatitis C antibody test positive 10/20/2019  . Hyperlipidemia   . Hypertension   . Moderate persistent asthma without complication   . Myocardial infarction ([place])   . Neuromuscular disorder ([place])   . Neuropathy   . Peripheral vascular disease ([place])   . Premature ejaculation   . Prostate cancer ([place])   . RLS (restless legs syndrome)   . Sleep apnea   . Stented coronary artery 11/13/2015  . Uncontrolled type 2 diabetes mellitus with stage 3 chronic kidney disease, with long-term current use of insulin [facility])     Patient Active Problem List   Diagnosis Date Noted  . Weight loss 06/28/2020  .  Normocytic anemia 02/02/2020  . Multiple falls 12/12/2019  . Hyponatremia 12/11/2019  . Acute lower UTI 12/11/2019  . Monoclonal gammopathy   . Palliative care by specialist   . Goals of care, counseling/discussion   . Prostate cancer metastatic to bone ([place]) 11/07/2019  . Elevated INR 11/05/2019  . Acute blood loss anemia 11/05/2019  . OSA on CPAP 11/05/2019  . Dyslipidemia 11/05/2019  . Morbid obesity ([place]) 11/05/2019  . Deep vein thrombosis (DVT) of proximal vein of left lower extremity ([place]) 11/05/2019  . Hematuria 11/04/2019  . Hepatitis C antibody test positive 10/20/2019  . Anasarca 10/15/2019  . Acute exacerbation of CHF (congestive heart failure) ([place]) 10/15/2019  . CAD (coronary artery disease) 10/15/2019  . Insulin dependent type 2 diabetes mellitus ([place]) 10/15/2019  . Essential hypertension 10/15/2019  . Chronic anticoagulation 10/15/2019  . Acute kidney injury superimposed on CKD ([place]) 10/15/2019  . CKD (chronic kidney disease) stage 3, GFR 30-59 ml/min (H[facility]) 10/15/2019  . Generalized weakness 10/15/2019  . Frequent falls 10/15/2019  . Elevated liver enzymes 10/15/2019  . Chronic diastolic CHF (congestive heart failure) ([place]) 10/15/2019  . Elevated LFTs   . Cellulitis of left lower extremity 07/04/2019  . Bilateral leg edema 01/31/2019  . Hyperlipidemia, mixed 01/31/2019  . Bladder outlet obstruction 01/18/2019  . History of recurrent UTI (urinary tract infection) 01/18/2019  . Fall at home, initial encounter 11/17/2018  . Acute cystitis without hematuria  10/03/2018  . Full code status 10/03/2018  . Loose stools 10/03/2018  . Tinea unguium 06/16/2016  . Moderate persistent asthma without complication 62/13/0865  . Glaucoma 04/12/2015  . Status post bilateral cataract extraction 04/12/2015  . Prostate cancer ([place]) 07/13/2013  . Diabetic autonomic neuropathy associated with type 2 diabetes mellitus ([place]) 02/10/2013  . Lumbar spinal stenosis 06/21/2012  .  Vitamin D deficiency 03/22/2010    Past Surgical History:  Procedure Laterality Date  . APPENDECTOMY    . [place]  2009  . COLONOSCOPY  2012  . EYE SURGERY    . IVC FILTER INSERTION N/A 11/07/2019   Procedure: IVC FILTER INSERTION;  Surgeon: Algernon Huxley, MD;  Location: [facility];  Service: Cardiovascular;  Laterality: N/A;  . PROSTATE SURGERY    . TONSILLECTOMY      Prior to Admission medications   Medication Sig Start Date End Date Taking? Authorizing Provider  abiraterone acetate (ZYTIGA) 250 MG tablet TAKE 4 TABLETS BY MOUTH DAILY ON AN EMPTY STOMACH 1 HOUR BEFORE OR 2 HOURS AFTER MEALS 09/27/20   [place], Lenna Sciara C, MD  amLODipine (NORVASC) 10 MG tablet Take 10 mg by mouth daily. Patient not taking: No sig reported 06/11/20   [provider]  atorvastatin (LIPITOR) 80 MG tablet Take 80 mg by mouth daily. 11/15/15   [provider]  carvedilol (COREG) 25 MG tablet Take 25 mg by mouth 2 (two) times daily with a meal.    [provider]  Cholecalciferol (VITAMIN D) 50 MCG (2000 UT) tablet Take 2,000 Units by mouth daily.     [provider]  DULoxetine (CYMBALTA) 60 MG capsule Take 60 mg by mouth daily.    [provider]  famotidine (PEPCID) 20 MG tablet Take 20 mg by mouth at bedtime as needed for heartburn.  08/26/19 10/11/20  [provider]  feeding supplement, ENSURE ENLIVE, (ENSURE ENLIVE) LIQD Take 237 mLs by mouth 4 (four) times daily -  with meals and at bedtime. Patient not taking: No sig reported 11/17/19   Fritzi Mandes, MD  finasteride (PROSCAR) 5 MG tablet Take 5 mg by mouth daily. 05/22/14   [provider]  furosemide (LASIX) 40 MG tablet Take 40 mg by mouth daily. 06/18/20   [provider]  gabapentin (NEURONTIN) 100 MG capsule Take 1 capsule (100 mg total) by mouth 2 (two) times daily. 12/15/19   Lavina Hamman, MD  insulin aspart (NOVOLOG) 100 UNIT/ML injection Inject 5 Units  into the skin 3 (three) times daily with meals. 11/17/19   Fritzi Mandes, MD  Insulin Detemir (LEVEMIR) 100 UNIT/ML Pen Inject 15 Units into the skin daily. 12/15/19   Lavina Hamman, MD  lisinopril (ZESTRIL) 10 MG tablet Take 10 mg by mouth daily. 07/18/20   [provider]  Multiple Vitamin (MULTI-VITAMIN) tablet Take 1 tablet by mouth daily.    [provider]  nitroGLYCERIN (NITROSTAT) 0.4 MG SL tablet Place 0.4 mg under the tongue every 5 (five) minutes as needed for chest pain.  Patient not taking: No sig reported    [provider]  polyethylene glycol (MIRALAX / GLYCOLAX) 17 g packet Take 17 g by mouth daily. 12/16/19   Lavina Hamman, MD  predniSONE (DELTASONE) 5 MG tablet TAKE 1 TABLET BY MOUTH DAILY WITH BREAKFAST. BEGIN TAKING WITH INITIATION OF ABIRATERONE. 12/06/19   Lequita Asal, MD  senna-docusate (SENOKOT-S) 8.6-50 MG tablet Take 1 tablet by mouth 2 (two) times  daily. 12/15/19   Lavina Hamman, MD  sulfamethoxazole-trimethoprim (BACTRIM DS) 800-160 MG tablet Take 1 tablet by mouth 2 (two) times daily. Patient not taking: No sig reported 04/30/20   [provider]  traZODone (DESYREL) 50 MG tablet Take 25 mg by mouth at bedtime. 07/18/20   [provider]  UNIFINE SAFECONTROL PEN NEEDLE 30G X 5 MM [place]  06/26/20   [provider]    Allergies Metformin and related  History reviewed. No pertinent family history.  Social History Social History   Tobacco Use  . Smoking status: Former Research scientist (life sciences)  . Smokeless tobacco: Never Used    Review of Systems  Constitutional: No fever/chills Eyes: No visual changes. ENT: No sore throat. Cardiovascular: Denies chest pain. Respiratory: Denies shortness of breath. Gastrointestinal: No nausea, no vomiting.  No diarrhea.  Genitourinary: Negative for dysuria.  Musculoskeletal: Negative for back pain. Skin: Negative for rash. Neurological: Negative for headaches, focal weakness or  numbness.   ____________________________________________   PHYSICAL EXAM:  VITAL SIGNS: ED Triage Vitals  Enc Vitals Group     BP 11/10/20 1207 (!) 478/67     Pulse Rate 11/10/20 1207 (!) 59     Resp 11/10/20 1207 16     Temp 11/10/20 1207 98 F (36.7 C)     Temp Source 11/10/20 1207 Oral     SpO2 11/10/20 1207 100 %     Weight 11/10/20 1241 247 lb (112 kg)     Height 11/10/20 1241 5\' 11"  (1.803 m)     Head Circumference --      Peak Flow --      Pain Score 11/10/20 1241 0     Pain Loc --      Pain Edu? --      Excl. in [place]? --     Constitutional: Alert and oriented. Well appearing and in no acute distress. Eyes: Conjunctivae are normal.  Head: Atraumatic. Nose: No congestion/rhinnorhea. Mouth/Throat: Mucous membranes are moist.   Neck: Normal range of motion.  Cardiovascular: Normal rate, regular rhythm. Good peripheral circulation. Respiratory: Normal respiratory effort.  No retractions.  Gastrointestinal: No distention.  Musculoskeletal: Extremities warm and well perfused.  Neurologic:  Normal speech and language.  Motor intact in all extremities.  No gross focal neurologic deficits are appreciated.  Skin:  Skin is warm and dry. No rash noted. Psychiatric: Mood and affect are normal. Speech and behavior are normal.  ____________________________________________   LABS (all labs ordered are listed, but only abnormal results are displayed)  Labs Reviewed  BASIC METABOLIC PANEL - Abnormal; Notable for the following components:      Result Value   Glucose, Bld 261 (*)    All other components within normal limits  CBC - Abnormal; Notable for the following components:   RBC 3.68 (*)    Hemoglobin 10.6 (*)    HCT 32.6 (*)    All other components within normal limits  CBG MONITORING, ED - Abnormal; Notable for the following components:   Glucose-Capillary 254 (*)    All other components within normal limits  CBG MONITORING, ED - Abnormal; Notable for the following  components:   Glucose-Capillary 164 (*)    All other components within normal limits  URINALYSIS, COMPLETE (UACMP) WITH MICROSCOPIC  CBG MONITORING, ED   ____________________________________________  EKG   ____________________________________________  RADIOLOGY    ____________________________________________   PROCEDURES  Procedure(s) performed: No  Procedures  Critical Care performed: No ____________________________________________   INITIAL IMPRESSION / ASSESSMENT  AND PLAN / ED COURSE  Pertinent labs & imaging results that were available during my care of the patient were reviewed by me and considered in my medical decision making (see chart for details).  87 year old male with PMH as noted above presents after an episode of lethargy this morning which was presumed to be due to hypoglycemia.  The patient received breakfast and his fingerstick was normal after this.  He has been asymptomatic since arriving to the ED this morning.  Of note, the patient had to wait for more than 8 hours before being placed in an exam area and seen.  During this time he had no recurrence of his symptoms.  He now states he feels completely fine and wants to go back to his facility.  On exam, his vital signs are normal.  Neurologic exam is nonfocal.  The physical exam is otherwise unremarkable.  Blood glucose was 260 on his initial labs in 164 on a fingerstick done right after my exam this evening.  Overall presentation is most consistent with hypoglycemia.  It is possible that the patient could be near syncopal from another etiology although there is no specific evidence for this.  There is no evidence of cardiac or CNS cause.  Labs do not suggest any metabolic disturbance.    The patient feels comfortable and would like to go home soon as possible.  Given that he has been observed in the ED for more than 8 hours with a stable blood glucose and no recurrent symptoms, he is appropriate for  discharge.  I counseled him on the results of the work-up.  Return precautions given, and he expresses understanding  ____________________________________________   FINAL CLINICAL IMPRESSION(S) / ED DIAGNOSES  Final diagnoses:  Hypoglycemia      NEW MEDICATIONS STARTED DURING THIS VISIT:  Discharge Medication List as of 11/10/2020  9:11 PM       Note:  This document was prepared using Dragon voice recognition software and may include unintentional dictation errors.   Arta Silence, MD 11/10/20 2352

## 2020-11-10 NOTE — ED Notes (Signed)
[facility]  called to transfer to [facility]

## 2020-11-10 NOTE — ED Notes (Signed)
Pt mildly confused per daughter. Daughter given update on planned disposition for pt.

## 2020-11-10 NOTE — ED Notes (Signed)
Pt reports falling three days in a row. Denies hitting head or LOC. Pt left hand swollen with full range of motion noted. Pt reports generalized soreness from the falls. Pt not lethargic at this time. A&Ox4. Pt assisted from wheelchair onto stretcher. Pt pants and brief soiled with urine and removed. Pants placed in pt belonging bag. Pt covered with warm blankets.

## 2020-11-12 ENCOUNTER — Emergency Department
Admission: EM | Admit: 2020-11-12 | Discharge: 2020-11-12 | Disposition: A | Discharge status: Home / Self Care | Payer: Medicare Other | Attending: Emergency Medicine | Admitting: Emergency Medicine

## 2020-11-12 ENCOUNTER — Encounter: Payer: Self-pay | Admitting: Emergency Medicine

## 2020-11-12 ENCOUNTER — Emergency Department: Payer: Medicare Other

## 2020-11-12 DIAGNOSIS — Z794 Long term (current) use of insulin: Secondary | ICD-10-CM | POA: Insufficient documentation

## 2020-11-12 DIAGNOSIS — S60221A Contusion of right hand, initial encounter: Secondary | ICD-10-CM | POA: Insufficient documentation

## 2020-11-12 DIAGNOSIS — I509 Heart failure, unspecified: Secondary | ICD-10-CM | POA: Diagnosis not present

## 2020-11-12 DIAGNOSIS — J4540 Moderate persistent asthma, uncomplicated: Secondary | ICD-10-CM | POA: Insufficient documentation

## 2020-11-12 DIAGNOSIS — I130 Hypertensive heart and chronic kidney disease with heart failure and stage 1 through stage 4 chronic kidney disease, or unspecified chronic kidney disease: Secondary | ICD-10-CM | POA: Diagnosis not present

## 2020-11-12 DIAGNOSIS — E1122 Type 2 diabetes mellitus with diabetic chronic kidney disease: Secondary | ICD-10-CM | POA: Insufficient documentation

## 2020-11-12 DIAGNOSIS — J449 Chronic obstructive pulmonary disease, unspecified: Secondary | ICD-10-CM | POA: Insufficient documentation

## 2020-11-12 DIAGNOSIS — Z79899 Other long term (current) drug therapy: Secondary | ICD-10-CM | POA: Diagnosis not present

## 2020-11-12 DIAGNOSIS — Z87891 Personal history of nicotine dependence: Secondary | ICD-10-CM | POA: Insufficient documentation

## 2020-11-12 DIAGNOSIS — I2510 Atherosclerotic heart disease of native coronary artery without angina pectoris: Secondary | ICD-10-CM | POA: Diagnosis not present

## 2020-11-12 DIAGNOSIS — N183 Chronic kidney disease, stage 3 unspecified: Secondary | ICD-10-CM | POA: Insufficient documentation

## 2020-11-12 DIAGNOSIS — R319 Hematuria, unspecified: Secondary | ICD-10-CM | POA: Diagnosis present

## 2020-11-12 DIAGNOSIS — W19XXXA Unspecified fall, initial encounter: Secondary | ICD-10-CM | POA: Diagnosis not present

## 2020-11-12 DIAGNOSIS — N390 Urinary tract infection, site not specified: Secondary | ICD-10-CM | POA: Diagnosis not present

## 2020-11-12 LAB — BASIC METABOLIC PANEL
Anion gap: 10 (ref 5–15)
BUN: 13 mg/dL (ref 8–23)
CO2: 29 mmol/L (ref 22–32)
Calcium: 9.1 mg/dL (ref 8.9–10.3)
Chloride: 99 mmol/L (ref 98–111)
Creatinine, Ser: 0.94 mg/dL (ref 0.61–1.24)
GFR, Estimated: >60 mL/min (ref >60)
Glucose, Bld: 131 mg/dL — ABNORMAL HIGH (ref 70–99)
Potassium: 3.3 mmol/L — ABNORMAL LOW (ref 3.5–5.1)
Sodium: 138 mmol/L (ref 135–145)

## 2020-11-12 LAB — URINALYSIS, ROUTINE W REFLEX MICROSCOPIC
Bilirubin Urine: NEGATIVE
Glucose, UA: NEGATIVE mg/dL
Ketones, ur: NEGATIVE mg/dL
Nitrite: NEGATIVE
Protein, ur: 30 mg/dL — AB
Specific Gravity, Urine: 1.013 (ref 1.005–1.030)
Squamous Epithelial / HPF: NONE SEEN (ref 0–5)
WBC, UA: >50 WBC/hpf — ABNORMAL HIGH (ref 0–5)
pH: 5 (ref 5.0–8.0)

## 2020-11-12 LAB — CBC WITH DIFFERENTIAL/PLATELET
Abs Immature Granulocytes: 0.02 10*3/uL (ref 0.00–0.07)
Basophils Absolute: 0 10*3/uL (ref 0.0–0.1)
Basophils Relative: 0 %
Eosinophils Absolute: 0.2 10*3/uL (ref 0.0–0.5)
Eosinophils Relative: 4 %
HCT: 30 % — ABNORMAL LOW (ref 39.0–52.0)
Hemoglobin: 10 g/dL — ABNORMAL LOW (ref 13.0–17.0)
Immature Granulocytes: 0 %
Lymphocytes Relative: 20 %
Lymphs Abs: 1.3 10*3/uL (ref 0.7–4.0)
MCH: 29.2 pg (ref 26.0–34.0)
MCHC: 33.3 g/dL (ref 30.0–36.0)
MCV: 87.7 fL (ref 80.0–100.0)
Monocytes Absolute: 0.6 10*3/uL (ref 0.1–1.0)
Monocytes Relative: 9 %
Neutro Abs: 4.4 10*3/uL (ref 1.7–7.7)
Neutrophils Relative %: 67 %
Platelets: 163 10*3/uL (ref 150–400)
RBC: 3.42 MIL/uL — ABNORMAL LOW (ref 4.22–5.81)
RDW: 13.3 % (ref 11.5–15.5)
WBC: 6.6 10*3/uL (ref 4.0–10.5)
nRBC: 0 % (ref 0.0–0.2)

## 2020-11-12 MED ORDER — CEFTRIAXONE SODIUM 1 G IJ SOLR: 1.0000 g | Freq: Once | INTRAMUSCULAR | Status: DC

## 2020-11-12 MED ORDER — SODIUM CHLORIDE 0.9 % IV SOLN
1.0000 g | Freq: Once | INTRAVENOUS | Status: AC
  Administered 2020-11-12: 1 g via INTRAVENOUS
  Filled 2020-11-12: qty 10

## 2020-11-12 MED ORDER — SODIUM CHLORIDE 0.9 % IV SOLN: 1.0000 g | Freq: Once | INTRAVENOUS | Status: DC

## 2020-11-12 MED ORDER — CEPHALEXIN 500 MG PO CAPS: 500.0000 mg | ORAL_CAPSULE | Freq: Two times a day (BID) | ORAL | 0 refills | Status: AC

## 2020-11-12 NOTE — ED Provider Notes (Addendum)
[facility] Emergency Department Provider Note  ____________________________________________   Event Date/Time   First MD Initiated Contact with Patient 11/12/20 0402     (approximate)  I have reviewed the triage vital signs and the nursing notes.   HISTORY  Chief Complaint Other (Urethrorrhagia )    HPI Brandon Cubero Sr. is a 87 y.o. male with history of insulin-dependent diabetes, hypertension, hyperlipidemia, prostate cancer on Zytiga who presents to the emergency department for concerns for hematuria that was noted by nursing home staff at [facility] when they were changing his briefs tonight.  He denies any fevers, back pain, abdominal pain, nausea or vomiting.  No dysuria.  Not on antiplatelets or anticoagulants.        Past Medical History:  Diagnosis Date  . Anemia, normocytic normochromic   . BPH (benign prostatic hyperplasia)   . CAD (coronary artery disease)   . Cataracts, bilateral   . Chronic renal insufficiency   . Colon polyp   . COPD (chronic obstructive pulmonary disease) ([place])   . Diabetes mellitus without complication ([place])   . DVT (deep venous thrombosis) ([place])   . Edema of both legs   . Glaucoma   . Gout   . Heart murmur   . Hepatitis C antibody test positive 10/20/2019  . Hyperlipidemia   . Hypertension   . Moderate persistent asthma without complication   . Myocardial infarction ([place])   . Neuromuscular disorder ([place])   . Neuropathy   . Peripheral vascular disease ([place])   . Premature ejaculation   . Prostate cancer ([place])   . RLS (restless legs syndrome)   . Sleep apnea   . Stented coronary artery 11/13/2015  . Uncontrolled type 2 diabetes mellitus with stage 3 chronic kidney disease, with long-term current use of insulin [facility])     Patient Active Problem List   Diagnosis Date Noted  . Weight loss 06/28/2020  . Normocytic anemia 02/02/2020  . Multiple falls 12/12/2019  . Hyponatremia 12/11/2019  . Acute  lower UTI 12/11/2019  . Monoclonal gammopathy   . Palliative care by specialist   . Goals of care, counseling/discussion   . Prostate cancer metastatic to bone ([place]) 11/07/2019  . Elevated INR 11/05/2019  . Acute blood loss anemia 11/05/2019  . OSA on CPAP 11/05/2019  . Dyslipidemia 11/05/2019  . Morbid obesity ([place]) 11/05/2019  . Deep vein thrombosis (DVT) of proximal vein of left lower extremity ([place]) 11/05/2019  . Hematuria 11/04/2019  . Hepatitis C antibody test positive 10/20/2019  . Anasarca 10/15/2019  . Acute exacerbation of CHF (congestive heart failure) ([place]) 10/15/2019  . CAD (coronary artery disease) 10/15/2019  . Insulin dependent type 2 diabetes mellitus ([place]) 10/15/2019  . Essential hypertension 10/15/2019  . Chronic anticoagulation 10/15/2019  . Acute kidney injury superimposed on CKD ([place]) 10/15/2019  . CKD (chronic kidney disease) stage 3, GFR 30-59 ml/min (HCC) 10/15/2019  . Generalized weakness 10/15/2019  . Frequent falls 10/15/2019  . Elevated liver enzymes 10/15/2019  . Chronic diastolic CHF (congestive heart failure) ([place]) 10/15/2019  . Elevated LFTs   . Cellulitis of left lower extremity 07/04/2019  . Bilateral leg edema 01/31/2019  . Hyperlipidemia, mixed 01/31/2019  . Bladder outlet obstruction 01/18/2019  . History of recurrent UTI (urinary tract infection) 01/18/2019  . Fall at home, initial encounter 11/17/2018  . Acute cystitis without hematuria 10/03/2018  . Full code status 10/03/2018  . Loose stools 10/03/2018  . Tinea unguium 06/16/2016  . Moderate persistent  asthma without complication 22/11/5425  . Glaucoma 04/12/2015  . Status post bilateral cataract extraction 04/12/2015  . Prostate cancer ([place]) 07/13/2013  . Diabetic autonomic neuropathy associated with type 2 diabetes mellitus ([place]) 02/10/2013  . Lumbar spinal stenosis 06/21/2012  . Vitamin D deficiency 03/22/2010    Past Surgical History:  Procedure Laterality Date  .  APPENDECTOMY    . [place]  2009  . COLONOSCOPY  2012  . EYE SURGERY    . IVC FILTER INSERTION N/A 11/07/2019   Procedure: IVC FILTER INSERTION;  Surgeon: Algernon Huxley, MD;  Location: [facility];  Service: Cardiovascular;  Laterality: N/A;  . PROSTATE SURGERY    . TONSILLECTOMY      Prior to Admission medications   Medication Sig Start Date End Date Taking? Authorizing Provider  cephALEXin (KEFLEX) 500 MG capsule Take 1 capsule (500 mg total) by mouth 2 (two) times daily. 11/12/20  Yes Barbi Kumagai, Cyril Mourning N, DO  abiraterone acetate (ZYTIGA) 250 MG tablet TAKE 4 TABLETS BY MOUTH DAILY ON AN EMPTY STOMACH 1 HOUR BEFORE OR 2 HOURS AFTER MEALS 09/27/20   [place], Lenna Sciara C, MD  amLODipine (NORVASC) 10 MG tablet Take 10 mg by mouth daily. Patient not taking: No sig reported 06/11/20   [provider]  atorvastatin (LIPITOR) 80 MG tablet Take 80 mg by mouth daily. 11/15/15   [provider]  carvedilol (COREG) 25 MG tablet Take 25 mg by mouth 2 (two) times daily with a meal.    [provider]  Cholecalciferol (VITAMIN D) 50 MCG (2000 UT) tablet Take 2,000 Units by mouth daily.     [provider]  DULoxetine (CYMBALTA) 60 MG capsule Take 60 mg by mouth daily.    [provider]  famotidine (PEPCID) 20 MG tablet Take 20 mg by mouth at bedtime as needed for heartburn.  08/26/19 10/11/20  [provider]  feeding supplement, ENSURE ENLIVE, (ENSURE ENLIVE) LIQD Take 237 mLs by mouth 4 (four) times daily -  with meals and at bedtime. Patient not taking: No sig reported 11/17/19   Fritzi Mandes, MD  finasteride (PROSCAR) 5 MG tablet Take 5 mg by mouth daily. 05/22/14   [provider]  furosemide (LASIX) 40 MG tablet Take 40 mg by mouth daily. 06/18/20   [provider]  gabapentin (NEURONTIN) 100 MG capsule Take 1 capsule (100 mg total) by mouth 2 (two) times daily. 12/15/19   Lavina Hamman, MD  insulin aspart (NOVOLOG)  100 UNIT/ML injection Inject 5 Units into the skin 3 (three) times daily with meals. 11/17/19   Fritzi Mandes, MD  Insulin Detemir (LEVEMIR) 100 UNIT/ML Pen Inject 15 Units into the skin daily. 12/15/19   Lavina Hamman, MD  lisinopril (ZESTRIL) 10 MG tablet Take 10 mg by mouth daily. 07/18/20   [provider]  Multiple Vitamin (MULTI-VITAMIN) tablet Take 1 tablet by mouth daily.    [provider]  nitroGLYCERIN (NITROSTAT) 0.4 MG SL tablet Place 0.4 mg under the tongue every 5 (five) minutes as needed for chest pain.  Patient not taking: No sig reported    [provider]  polyethylene glycol (MIRALAX / GLYCOLAX) 17 g packet Take 17 g by mouth daily. 12/16/19   Lavina Hamman, MD  predniSONE (DELTASONE) 5 MG tablet TAKE 1 TABLET BY MOUTH DAILY WITH BREAKFAST. BEGIN TAKING WITH INITIATION OF ABIRATERONE. 12/06/19   Lequita Asal, MD  senna-docusate (SENOKOT-S) 8.6-50 MG tablet Take 1 tablet by  mouth 2 (two) times daily. 12/15/19   Lavina Hamman, MD  sulfamethoxazole-trimethoprim (BACTRIM DS) 800-160 MG tablet Take 1 tablet by mouth 2 (two) times daily. Patient not taking: No sig reported 04/30/20   [provider]  traZODone (DESYREL) 50 MG tablet Take 25 mg by mouth at bedtime. 07/18/20   [provider]  UNIFINE SAFECONTROL PEN NEEDLE 30G X 5 MM [place]  06/26/20   [provider]    Allergies Metformin and related  History reviewed. No pertinent family history.  Social History Social History   Tobacco Use  . Smoking status: Former Research scientist (life sciences)  . Smokeless tobacco: Never Used    Review of Systems Constitutional: No fever. Eyes: No visual changes. ENT: No sore throat. Cardiovascular: Denies chest pain. Respiratory: Denies shortness of breath. Gastrointestinal: No nausea, vomiting, diarrhea. Genitourinary: Negative for dysuria. Musculoskeletal: Negative for back pain. Skin: Negative for rash. Neurological: Negative for focal weakness  or numbness.  ____________________________________________   PHYSICAL EXAM:  VITAL SIGNS: ED Triage Vitals [11/12/20 0246]  Enc Vitals Group     BP (!) 152/66     Pulse Rate 60     Resp 18     Temp 98.2 F (36.8 C)     Temp Source Oral     SpO2 100 %     Weight      Height      Head Circumference      Peak Flow      Pain Score      Pain Loc      Pain Edu?      Excl. in [place]?    CONSTITUTIONAL: Alert and oriented and responds appropriately to questions.  Elderly.  In no distress.  Appears comfortable. HEAD: Normocephalic EYES: Conjunctivae clear, pupils appear equal, EOM appear intact ENT: normal nose; moist mucous membranes NECK: Supple, normal ROM CARD: RRR; S1 and S2 appreciated; no murmurs, no clicks, no rubs, no gallops RESP: Normal chest excursion without splinting or tachypnea; breath sounds clear and equal bilaterally; no wheezes, no rhonchi, no rales, no hypoxia or respiratory distress, speaking full sentences ABD/GI: Normal bowel sounds; non-distended; soft, non-tender, no rebound, no guarding, no peritoneal signs, no hepatosplenomegaly BACK: The back appears normal EXT: Normal ROM in all joints; no deformity noted, no edema; no cyanosis SKIN: Normal color for age and race; warm; no rash on exposed skin NEURO: Moves all extremities equally PSYCH: The patient's mood and manner are appropriate.  ____________________________________________   LABS (all labs ordered are listed, but only abnormal results are displayed)  Labs Reviewed  URINALYSIS, ROUTINE W REFLEX MICROSCOPIC - Abnormal; Notable for the following components:      Result Value   Color, Urine AMBER (*)    APPearance TURBID (*)    Hgb urine dipstick MODERATE (*)    Protein, ur 30 (*)    Leukocytes,Ua LARGE (*)    WBC, UA >50 (*)    Bacteria, UA MANY (*)    All other components within normal limits  CBC WITH DIFFERENTIAL/PLATELET - Abnormal; Notable for the following components:   RBC 3.42 (*)     Hemoglobin 10.0 (*)    HCT 30.0 (*)    All other components within normal limits  BASIC METABOLIC PANEL - Abnormal; Notable for the following components:   Potassium 3.3 (*)    Glucose, Bld 131 (*)    All other components within normal limits  URINE CULTURE   ____________________________________________  EKG  None ____________________________________________  RADIOLOGY  I, Amarilis Belflower, personally viewed and evaluated these images (plain radiographs) as part of my medical decision making, as well as reviewing the written report by the radiologist.  ED MD interpretation: None  Official radiology report(s): DG Wrist Complete Right  Result Date: 11/12/2020 CLINICAL DATA:  Fall with hand bruising. EXAM: RIGHT WRIST - COMPLETE 3+ VIEW COMPARISON:  None. FINDINGS: No acute fracture or subluxation. 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 Advanced second MCP osteoarthritis with subluxation. IMPRESSION: 1. No acute finding. 2. Advanced wrist and second MCP osteoarthritis. Electronically Signed   By: Monte Fantasia M.D.   On: 11/12/2020 07:08   DG Hand Complete Right  Result Date: 11/12/2020 CLINICAL DATA:  Fall with hand bruising EXAM: RIGHT HAND - COMPLETE 3+ VIEW COMPARISON:  None. FINDINGS: Advanced osteoarthritis at the wrist and second MCP joint, which is anteriorly subluxed. More moderate osteoarthritis of the interphalangeal joints. No evidence of fracture. No traumatic malalignment. IMPRESSION: 1. Soft tissue swelling without acute finding. 2. Advanced wrist and second MCP osteoarthritis. Electronically Signed   By: Monte Fantasia M.D.   On: 11/12/2020 07:09    ____________________________________________   PROCEDURES  Procedure(s) performed (including Critical Care):  Procedures    ____________________________________________   INITIAL IMPRESSION / ASSESSMENT AND PLAN / ED COURSE  As part  of my medical decision making, I reviewed the following data within the [place] notes reviewed and incorporated, Labs reviewed, Old chart reviewed and Notes from prior ED visits         Patient here for gross hematuria.  Differential includes urinary tract infection, urethritis, kidney stone.  He denies any pain at this time and is well-appearing.  Labs obtained in triage shows stable hemoglobin, normal platelets.  He is not on blood thinners.  Renal function normal.  Plan is to obtain urinalysis, urine culture for further evaluation.    Patient's urine appears infected.  He has previously grown Klebsiella sensitive to cephalosporins.  Will give dose of IV Rocephin here and discharged back to his nursing facility on Keflex.  He does have small amounts of blood at the urethral meatus but no discharge and no hemorrhaging.   At this time, I do not feel there is any life-threatening condition present. I have reviewed, interpreted and discussed all results (EKG, imaging, lab, urine as appropriate) and exam findings with patient/family. I have reviewed nursing notes and appropriate previous records.  I feel the patient is safe to be discharged home without further emergent workup and can continue workup as an outpatient as needed. Discussed usual and customary return precautions. Patient/family verbalize understanding and are comfortable with this plan.  Outpatient follow-up has been provided as needed. All questions have been answered.  6:15 AM  Prior to discharge it was noted that patient had right hand and wrist swelling and bruising.  He states he fell against a wall yesterday.  Denies any other injury including head injury.  No neck pain or neck stiffness.  Right hand is swollen as well as the wrist with diffuse ulnar bony tenderness without deformity.  He has ecchymosis noted.  No redness or warmth.  Normal capillary refill.  2+ right radial pulse.  Will obtain  x-rays.  7:20 AM  Pt's xrays showed no acute abnormality.  Recommended ice, elevation, rest, Tylenol prn.   ____________________________________________   FINAL CLINICAL IMPRESSION(S) / ED DIAGNOSES  Final diagnoses:  Acute UTI  Contusion of right hand, initial encounter  ED Discharge Orders         Ordered    cephALEXin (KEFLEX) 500 MG capsule  2 times daily        11/12/20 0524          *Please note:  Brandon Busing Sr. was evaluated in Emergency Department on 11/12/2020 for the symptoms described in the history of present illness. He was evaluated in the context of the global COVID-19 pandemic, which necessitated consideration that the patient might be at risk for infection with the SARS-CoV-2 virus that causes COVID-19. Institutional protocols and algorithms that pertain to the evaluation of patients at risk for COVID-19 are in a state of rapid change based on information released by regulatory bodies including the CDC and federal and state organizations. These policies and algorithms were followed during the patient's care in the ED.  Some ED evaluations and interventions may be delayed as a result of limited staffing during and the pandemic.*   Note:  This document was prepared using Dragon voice recognition software and may include unintentional dictation errors.   Kyrollos Cordell, Delice Bison, DO 11/12/20 [place], Delice Bison, DO 11/12/20 [phone]

## 2020-11-12 NOTE — ED Notes (Addendum)
No e-signature able to be obtained due to no e-sig pad being available for hallway use.  Pt verbalized understanding of all discharge instructions and f/u care.

## 2020-11-12 NOTE — ED Notes (Signed)
[facility] called and informed the patient is ready for discharge and needs transportation.  [facility] verbalized that they would pick the patient up from the ED lobby.

## 2020-11-12 NOTE — ED Notes (Signed)
Called [facility] for transport to [facility]

## 2020-11-12 NOTE — ED Notes (Addendum)
pt poor historian reports fall bruising and swelling noted to righ t wrist and hand, EDP aware, pt returned to bed, pt assessed by EDP  CMS intact to extremity, EDP moved pinky ring to left hand pinky

## 2020-11-12 NOTE — Discharge Instructions (Addendum)
Your labs today were normal.  Your urine showed a urinary tract infection.  You have received a dose of IV antibiotics in the ED.  I recommend that you start taking Keflex twice a day starting the morning of 11/13/2020.  Xrays of your right hand and wrist showed no fractures.  You may take Tylenol 1000 mg every 6 hours as needed for pain.

## 2020-11-12 NOTE — ED Notes (Signed)
Pt to toilet att, sitting

## 2020-11-12 NOTE — ED Notes (Signed)
Pts daughter called and informed ED secretary that she does not want pt sent back to [facility] and would like to speak with the MD.  MD notified of this information.

## 2020-11-12 NOTE — ED Notes (Signed)
Pt found in hallway att, pt oriented to month and year and self, unsure of current location and reason for being here, pt able to tell me that some one called EMS from [facility]", pt reports that he was here yesterday and his daughter "came and pulled me out after they didn't do anything"

## 2020-11-12 NOTE — ED Triage Notes (Signed)
Pt to ED via EMS from [facility] due to urethral bleeding noted when pts brief changed. No injury reported.

## 2020-11-12 NOTE — ED Notes (Signed)
Pt given another warm blanket. Pt resting at this time and is in NAD

## 2020-11-12 NOTE — ED Notes (Addendum)
Blood dripping from meatus prior to beginning I & O, EDP aware

## 2020-11-13 ENCOUNTER — Ambulatory Visit: Admission: RE | Admit: 2020-11-13 | Payer: Medicare Other | Source: Ambulatory Visit

## 2020-11-13 NOTE — Progress Notes (Signed)
[facility]  [address] [place], [place] [address] Phone: [phone]  Fax: [phone]   Clinic Day:  11/14/2020  Referring physician: [name], PA-C  Chief Complaint: Brandon Busing Sr. is a 87 y.o. male with metastatic prostate cancer on abiraterone and stage III chronic kidney disease who is seen for 1 month assessment, review of imaging, and monthly Degarelix.  HPI: The patient was last seen in the medical oncology clinic on 10/11/2020. At that time, he had been "fine."  He continued to stay at [facility].  He denied any concerns.  Exam was stable. Hematocrit was 32.8, hemoglobin 10.7, MCV 89.1, platelets 186,000, WBC 7,300.  Albumin was 3.4. Creatinine was 1.19 (CrCl 59 ml/min). Ferritin was 288 with an iron saturation of 24% and a TIBC of 230. Reticulocyte count was 1.6%. TSH was 2.079. Vitamin B12 was 494 and folate 28.0. PSA was 244.00. He received Degarelix.  The patient was seen in the ER for gross hematuria on 11/12/2020. UA showed a UTI, urine culture showed E. coli. He received a dose of ceftriaxone and was discharged back to his nursing facility on Keflex.  Abdomen and pelvis CT as well as bone scan on 11/13/2020 was cancelled.  During the interim, he has been alright. He fell 3 x on Saturday. He is not sure why he fell; states he was being careless. His right hand is swollen and sore and his right knee hurts. He also scraped his left forearm. He had a right hand x ray which was negative for fracture.  The patient denies bone pain. His diabetes is up and down. He does not eat much because he does not like the food at the care facility.   The patient requires assistance with ADLs. He is not aware of the medications he takes. He thinks he is taking Zytiga.   Past Medical History:  Diagnosis Date  . Anemia, normocytic normochromic   . BPH (benign prostatic hyperplasia)   . CAD (coronary artery disease)   . Cataracts,  bilateral   . Chronic renal insufficiency   . Colon polyp   . COPD (chronic obstructive pulmonary disease) ([place])   . Diabetes mellitus without complication ([place])   . DVT (deep venous thrombosis) ([place])   . Edema of both legs   . Glaucoma   . Gout   . Heart murmur   . Hepatitis C antibody test positive 10/20/2019  . Hyperlipidemia   . Hypertension   . Moderate persistent asthma without complication   . Myocardial infarction ([place])   . Neuromuscular disorder ([place])   . Neuropathy   . Peripheral vascular disease ([place])   . Premature ejaculation   . Prostate cancer ([place])   . RLS (restless legs syndrome)   . Sleep apnea   . Stented coronary artery 11/13/2015  . Uncontrolled type 2 diabetes mellitus with stage 3 chronic kidney disease, with long-term current use of insulin ([place])     Past Surgical History:  Procedure Laterality Date  . APPENDECTOMY    . [place]  2009  . COLONOSCOPY  2012  . EYE SURGERY    . IVC FILTER INSERTION N/A 11/07/2019   Procedure: IVC FILTER INSERTION;  Surgeon: Algernon Huxley, MD;  Location: [facility];  Service: Cardiovascular;  Laterality: N/A;  . PROSTATE SURGERY    . TONSILLECTOMY      History reviewed. No pertinent family history.  Social History:  reports that he has quit smoking.  He has never used smokeless tobacco. No history on file for alcohol use and drug use. He previously smoked <1 pack/day x 20 years (stopped smoking with cardiac stent placement). He previously drank liquor on the weekend (stopped 2020). He is retired Dance movement psychotherapist). He previously lived outside of [place]. He then moved to [place] then [place] to live with his daughter Audi Wettstein [phone]). His wife died of cancer 1 year ago.He is currently living at [facility] assisted living. The patient is alone today.  Allergies:  Allergies  Allergen Reactions  . Metformin And Related Diarrhea    Intolerance due to naturally  loose bowels    Current Medications: Current Outpatient Medications  Medication Sig Dispense Refill  . abiraterone acetate (ZYTIGA) 250 MG tablet TAKE 4 TABLETS BY MOUTH DAILY ON AN EMPTY STOMACH 1 HOUR BEFORE OR 2 HOURS AFTER MEALS 120 tablet 1  . amLODipine (NORVASC) 10 MG tablet Take 10 mg by mouth daily. (Patient not taking: No sig reported)    . atorvastatin (LIPITOR) 80 MG tablet Take 80 mg by mouth daily.    Marland Kitchen azithromycin (ZITHROMAX) 250 MG tablet Take 250 mg by mouth as directed.    . carvedilol (COREG) 25 MG tablet Take 25 mg by mouth 2 (two) times daily with a meal.    . cephALEXin (KEFLEX) 500 MG capsule Take 1 capsule (500 mg total) by mouth 2 (two) times daily. 14 capsule 0  . Cholecalciferol (VITAMIN D) 50 MCG (2000 UT) tablet Take 2,000 Units by mouth daily.     . DULoxetine (CYMBALTA) 60 MG capsule Take 60 mg by mouth daily.    . famotidine (PEPCID) 20 MG tablet Take 20 mg by mouth at bedtime as needed for heartburn.     . feeding supplement, ENSURE ENLIVE, (ENSURE ENLIVE) LIQD Take 237 mLs by mouth 4 (four) times daily -  with meals and at bedtime. (Patient not taking: No sig reported) 237 mL 12  . finasteride (PROSCAR) 5 MG tablet Take 5 mg by mouth daily.    . furosemide (LASIX) 40 MG tablet Take 40 mg by mouth daily.    Marland Kitchen gabapentin (NEURONTIN) 100 MG capsule Take 1 capsule (100 mg total) by mouth 2 (two) times daily. 60 capsule 0  . insulin aspart (NOVOLOG) 100 UNIT/ML injection Inject 5 Units into the skin 3 (three) times daily with meals. 10 mL 11  . Insulin Detemir (LEVEMIR) 100 UNIT/ML Pen Inject 15 Units into the skin daily. 20 mL 0  . lisinopril (ZESTRIL) 10 MG tablet Take 10 mg by mouth daily.    . Multiple Vitamin (MULTI-VITAMIN) tablet Take 1 tablet by mouth daily.    Marland Kitchen MYRBETRIQ 50 MG TB24 tablet Take 50 mg by mouth daily.    . nitroGLYCERIN (NITROSTAT) 0.4 MG SL tablet Place 0.4 mg under the tongue every 5 (five) minutes as needed for chest pain.  (Patient not  taking: No sig reported)    . polyethylene glycol (MIRALAX / GLYCOLAX) 17 g packet Take 17 g by mouth daily. 14 each 0  . predniSONE (DELTASONE) 5 MG tablet TAKE 1 TABLET BY MOUTH DAILY WITH BREAKFAST. BEGIN TAKING WITH INITIATION OF ABIRATERONE. 30 tablet 0  . senna-docusate (SENOKOT-S) 8.6-50 MG tablet Take 1 tablet by mouth 2 (two) times daily. 10 tablet 0  . sulfamethoxazole-trimethoprim (BACTRIM DS) 800-160 MG tablet Take 1 tablet by mouth 2 (two) times daily. (Patient not taking: No sig reported)    . traZODone (DESYREL) 50  MG tablet Take 25 mg by mouth at bedtime.    Marland Kitchen UNIFINE SAFECONTROL PEN NEEDLE 30G X 5 MM MISC      No current facility-administered medications for this visit.   Review of Systems  Constitutional: Negative for chills, diaphoresis, fever, malaise/fatigue and weight loss (up 1 lb since 08/2020).       Feels "alright".  HENT: Negative for congestion, ear discharge, ear pain, hearing loss, nosebleeds, sinus pain, sore throat and tinnitus.   Eyes: Negative for blurred vision and double vision.  Respiratory: Negative for cough, hemoptysis, sputum production and shortness of breath.   Cardiovascular: Positive for leg swelling (chronic). Negative for chest pain and palpitations.  Gastrointestinal: Negative for abdominal pain, blood in stool, constipation, diarrhea, heartburn, melena, nausea and vomiting.       Poor diet.  Genitourinary: Negative for dysuria, frequency, hematuria and urgency.  Musculoskeletal: Positive for falls (3 x on Saturday) and joint pain (right hip s/p fall). Negative for back pain, myalgias and neck pain.       Right hand swellings s/p fall  Skin: Negative for itching and rash.       Left forearm scraped s/p fall  Neurological: Negative for dizziness, tingling, sensory change, weakness and headaches.  Endo/Heme/Allergies: Does not bruise/bleed easily.       Diabetes; up and down  Psychiatric/Behavioral: Negative for depression and memory loss. The  patient is not nervous/anxious and does not have insomnia.   All other systems reviewed and are negative.  Performance status (ECOG): 3  Vitals Blood pressure (!) 144/58, pulse 63, temperature 98 F (36.7 C), temperature source Oral, resp. rate 20, weight 248 lb 9.1 oz (112.8 kg), SpO2 100 %.   Physical Exam Vitals and nursing note reviewed.  Constitutional:      General: He is not in acute distress.    Appearance: He is not diaphoretic.     Comments: Patient examined in a wheelchair.  HENT:     Head: Normocephalic and atraumatic.     Mouth/Throat:     Mouth: Mucous membranes are moist.     Pharynx: Oropharynx is clear.  Eyes:     General: No scleral icterus.    Extraocular Movements: Extraocular movements intact.     Conjunctiva/sclera: Conjunctivae normal.     Pupils: Pupils are equal, round, and reactive to light.     Comments: Brown eyes.  Cardiovascular:     Rate and Rhythm: Normal rate and regular rhythm.     Heart sounds: Normal heart sounds. No murmur heard.   Pulmonary:     Effort: Pulmonary effort is normal. No respiratory distress.     Breath sounds: Normal breath sounds. No wheezing or rales.  Chest:     Chest wall: No tenderness.  Breasts:     Right: No axillary adenopathy or supraclavicular adenopathy.     Left: No axillary adenopathy or supraclavicular adenopathy.    Abdominal:     General: Bowel sounds are normal. There is no distension.     Palpations: Abdomen is soft. There is no mass.     Tenderness: There is no abdominal tenderness. There is no guarding or rebound.  Musculoskeletal:        General: Swelling (right hand s/p fall) present. No tenderness. Normal range of motion.     Cervical back: Normal range of motion and neck supple.     Right lower leg: Edema (chronic) present.     Left lower leg: Edema (chronic) present.  Lymphadenopathy:  Head:     Right side of head: No preauricular, posterior auricular or occipital adenopathy.     Left  side of head: No preauricular, posterior auricular or occipital adenopathy.     Cervical: No cervical adenopathy.     Upper Body:     Right upper body: No supraclavicular or axillary adenopathy.     Left upper body: No supraclavicular or axillary adenopathy.     Lower Body: No right inguinal adenopathy. No left inguinal adenopathy.  Skin:    General: Skin is warm and dry.     Comments: Scratches on left forearm and wrist s/p fall.  Neurological:     Mental Status: He is alert and oriented to person, place, and time.  Psychiatric:        Behavior: Behavior normal.        Thought Content: Thought content normal.        Judgment: Judgment normal.    Imaging studies: 11/05/2019:  Renal stone CTrevealed confluentnodular massesadjacent to the prostate and distal sigmoid colon with left pelvic, perirectal, retroperitoneal, and mesenteric adenopathy as well as probable peritoneal diseasemost marked in the right upper quadrant extending about the right diaphragmatic leaflet. There was pelvic and suspected vertebral osseous metastatic diseasewith L1 compression fracture, likely pathologic.There was a possible 2 cm right lower lobe lung nodule.There was left hydronephrosisand proximal ureterectasis secondary to pelvic process (prior renal ultrasound on 10/16/2019 revealed no hydronephrosis). 11/08/2019:  Chest CTrevealed right pleural effusion with pleural metastasis(2.7 x 2.1 cm nodularity along anterior right hemidiaphragm). Osseous metastasis, including a dominant lesioninvolving the right-side of the L2vertebral body.  11/08/2019:  Bone scanrevealed widespread bony metastasis(right occiput, clavicles, left upper extremity, sternum, ribs bilaterally, throughout the cervical, thoracic, lumbar spine, bony pelvis, proximal femurs, right tibia).  07/11/2020:  Bone scan revealed interval response to therapy with the resolution or varying degrees of diminishing uptake within widespread  skeletal metastases noted on prior examination. Left hydronephrosis resolved. There was increased focal uptake within the medial ankles bilaterally, left greater than right, suggestive of interval trauma since prior examination. Correlation with plain film examination may be helpful for further evaluation. 07/18/2020:  Chest, abdomen and pelvis CT without contrast revealed significant interval reduction in previously seen bulky lymphadenopathy throughout the pelvis and retroperitoneum, c/w treatment response. There was interval increase in extensive sclerotic osseous metastatic disease throughout the included skeleton, likely reflecting post treatment change of existing osseous metastatic disease. Multiple subtle wedge and endplate deformities were unchanged, most notably a wedge deformity of L1 with approximately 50% height loss. There was prostatomegaly. There was coronary artery disease and aortic atherosclerosis.   Appointment on 11/14/2020  Component Date Value Ref Range Status  . WBC 11/14/2020 5.9  4.0 - 10.5 K/uL Final  . RBC 11/14/2020 3.45* 4.22 - 5.81 MIL/uL Final  . Hemoglobin 11/14/2020 9.9* 13.0 - 17.0 g/dL Final  . HCT 11/14/2020 30.4* 39.0 - 52.0 % Final  . MCV 11/14/2020 88.1  80.0 - 100.0 fL Final  . MCH 11/14/2020 28.7  26.0 - 34.0 pg Final  . MCHC 11/14/2020 32.6  30.0 - 36.0 g/dL Final  . RDW 11/14/2020 13.5  11.5 - 15.5 % Final  . Platelets 11/14/2020 171  150 - 400 K/uL Final  . nRBC 11/14/2020 0.0  0.0 - 0.2 % Final  . Neutrophils Relative % 11/14/2020 77  % Final  . Neutro Abs 11/14/2020 4.5  1.7 - 7.7 K/uL Final  . Lymphocytes Relative 11/14/2020 13  % Final  .  Lymphs Abs 11/14/2020 0.8  0.7 - 4.0 K/uL Final  . Monocytes Relative 11/14/2020 8  % Final  . Monocytes Absolute 11/14/2020 0.5  0.1 - 1.0 K/uL Final  . Eosinophils Relative 11/14/2020 2  % Final  . Eosinophils Absolute 11/14/2020 0.1  0.0 - 0.5 K/uL Final  . Basophils Relative 11/14/2020 0  % Final  .  Basophils Absolute 11/14/2020 0.0  0.0 - 0.1 K/uL Final  . Immature Granulocytes 11/14/2020 0  % Final  . Abs Immature Granulocytes 11/14/2020 0.02  0.00 - 0.07 K/uL Final   Performed at [facility], [address], [place], [place] [address]  . Sodium 11/14/2020 135  135 - 145 mmol/L Final  . Potassium 11/14/2020 3.4* 3.5 - 5.1 mmol/L Final  . Chloride 11/14/2020 99  98 - 111 mmol/L Final  . CO2 11/14/2020 27  22 - 32 mmol/L Final  . Glucose, Bld 11/14/2020 208* 70 - 99 mg/dL Final   Glucose reference range applies only to samples taken after fasting for at least 8 hours.  . BUN 11/14/2020 14  8 - 23 mg/dL Final  . Creatinine, Ser 11/14/2020 1.24  0.61 - 1.24 mg/dL Final  . Calcium 11/14/2020 9.0  8.9 - 10.3 mg/dL Final  . Total Protein 11/14/2020 6.2* 6.5 - 8.1 g/dL Final  . Albumin 11/14/2020 3.1* 3.5 - 5.0 g/dL Final  . AST 11/14/2020 23  15 - 41 U/L Final  . ALT 11/14/2020 20  0 - 44 U/L Final  . Alkaline Phosphatase 11/14/2020 97  38 - 126 U/L Final  . Total Bilirubin 11/14/2020 0.7  0.3 - 1.2 mg/dL Final  . GFR, Estimated 11/14/2020 57* >60 mL/min Final   Comment: (NOTE) Calculated using the CKD-EPI Creatinine Equation (2021)   . Anion gap 11/14/2020 9  5 - 15 Final   Performed at [facility], [name] [place], [place] [address]  Admission on 11/12/2020, Discharged on 11/12/2020  Component Date Value Ref Range Status  . Color, Urine 11/12/2020 AMBER* YELLOW Final   BIOCHEMICALS MAY BE AFFECTED BY COLOR  . APPearance 11/12/2020 TURBID* CLEAR Final  . Specific Gravity, Urine 11/12/2020 1.013  1.005 - 1.030 Final  . pH 11/12/2020 5.0  5.0 - 8.0 Final  . Glucose, UA 11/12/2020 NEGATIVE  NEGATIVE mg/dL Final  . Hgb urine dipstick 11/12/2020 MODERATE* NEGATIVE Final  . Bilirubin Urine 11/12/2020 NEGATIVE  NEGATIVE Final  . Ketones, ur 11/12/2020 NEGATIVE  NEGATIVE mg/dL Final  . Protein, ur 11/12/2020 30* NEGATIVE mg/dL Final  . Nitrite  11/12/2020 NEGATIVE  NEGATIVE Final  . Chalmers Guest 11/12/2020 LARGE* NEGATIVE Final  . RBC / HPF 11/12/2020 21-50  0 - 5 RBC/hpf Final  . WBC, UA 11/12/2020 >50* 0 - 5 WBC/hpf Final  . Bacteria, UA 11/12/2020 MANY* NONE SEEN Final  . Squamous Epithelial / LPF 11/12/2020 NONE SEEN  0 - 5 Final  . WBC Clumps 11/12/2020 PRESENT   Final   Performed at [facility], [address], [place], [place] [address]  . Specimen Description 11/12/2020    Final                   Value:URINE, RANDOM Performed at [facility], [address] [place], [place] [address]   . Special Requests 11/12/2020    Final                   Value:NONE Performed at [facility], [address] [place], [place] [address]   . Culture 11/12/2020 >=100,000 COLONIES/mL ESCHERICHIA COLI*  Final  . Report Status 11/12/2020 11/14/2020 FINAL   Final  . Organism ID, Bacteria 11/12/2020 ESCHERICHIA COLI*  Final  . WBC 11/12/2020 6.6  4.0 - 10.5 K/uL Final  . RBC 11/12/2020 3.42* 4.22 - 5.81 MIL/uL Final  . Hemoglobin 11/12/2020 10.0* 13.0 - 17.0 g/dL Final  . HCT 11/12/2020 30.0* 39.0 - 52.0 % Final  . MCV 11/12/2020 87.7  80.0 - 100.0 fL Final  . MCH 11/12/2020 29.2  26.0 - 34.0 pg Final  . MCHC 11/12/2020 33.3  30.0 - 36.0 g/dL Final  . RDW 11/12/2020 13.3  11.5 - 15.5 % Final  . Platelets 11/12/2020 163  150 - 400 K/uL Final  . nRBC 11/12/2020 0.0  0.0 - 0.2 % Final  . Neutrophils Relative % 11/12/2020 67  % Final  . Neutro Abs 11/12/2020 4.4  1.7 - 7.7 K/uL Final  . Lymphocytes Relative 11/12/2020 20  % Final  . Lymphs Abs 11/12/2020 1.3  0.7 - 4.0 K/uL Final  . Monocytes Relative 11/12/2020 9  % Final  . Monocytes Absolute 11/12/2020 0.6  0.1 - 1.0 K/uL Final  . Eosinophils Relative 11/12/2020 4  % Final  . Eosinophils Absolute 11/12/2020 0.2  0.0 - 0.5 K/uL Final  . Basophils Relative 11/12/2020 0  % Final  . Basophils Absolute 11/12/2020 0.0  0.0 - 0.1 K/uL Final  . Immature  Granulocytes 11/12/2020 0  % Final  . Abs Immature Granulocytes 11/12/2020 0.02  0.00 - 0.07 K/uL Final   Performed at [facility], [address], [place], [place] [address]  . Sodium 11/12/2020 138  135 - 145 mmol/L Final  . Potassium 11/12/2020 3.3* 3.5 - 5.1 mmol/L Final  . Chloride 11/12/2020 99  98 - 111 mmol/L Final  . CO2 11/12/2020 29  22 - 32 mmol/L Final  . Glucose, Bld 11/12/2020 131* 70 - 99 mg/dL Final   Glucose reference range applies only to samples taken after fasting for at least 8 hours.  . BUN 11/12/2020 13  8 - 23 mg/dL Final  . Creatinine, Ser 11/12/2020 0.94  0.61 - 1.24 mg/dL Final  . Calcium 11/12/2020 9.1  8.9 - 10.3 mg/dL Final  . GFR, Estimated 11/12/2020 >60  >60 mL/min Final   Comment: (NOTE) Calculated using the CKD-EPI Creatinine Equation (2021)   . Anion gap 11/12/2020 10  5 - 15 Final   Performed at [facility], [address] [place], [place] [address]    Assessment:  Brandon Seier Sr. is a 87 y.o. male with metastatic prostate cancer. He presented with gross hematuria. PSAwas 2241 on 11/06/2019.   Renal stone CTon 11/05/2019 revealed confluentnodular massesadjacent to the prostate and distal sigmoid colon with left pelvic, perirectal, retroperitoneal, and mesenteric adenopathy as well as probable peritoneal diseasemost marked in the right upper quadrant extending about the right diaphragmatic leaflet. There was pelvic and suspected vertebral osseous metastatic diseasewith L1 compression fracture, likely pathologic.There was a possible 2 cm right lower lobe lung nodule.There was left hydronephrosisand proximal ureterectasis secondary to pelvic process (prior renal ultrasound on 10/16/2019 revealed no hydronephrosis).  Chest CTon 11/08/2019 revealed right pleural effusion with pleural metastasis(2.7 x 2.1 cm nodularity along anterior right hemidiaphragm). Osseous metastasis, including a dominant lesioninvolving  the right-side of the L2vertebral body.   Bone scanon 11/08/2019 revealed widespread bony metastasis(right occiput, clavicles, left upper extremity, sternum, ribs bilaterally, throughout the cervical, thoracic, lumbar  spine, bony pelvis, proximal femurs, right tibia). Patient declined biopsyfor NGS.  He received degarelixon 11/11/2019(last 06/28/2020). He began abirateroneon 11/14/2019.  Bone scan on 07/11/2020 revealed interval response to therapy with the resolution or varying degrees of diminishing uptake within widespread skeletal metastases noted on prior examination. Left hydronephrosis resolved. There was increased focal uptake within the medial ankles bilaterally, left greater than right, suggestive of interval trauma since prior examination. Correlation with plain film examination may be helpful for further evaluation.  Chest, abdomen and pelvis CT without contrast on 07/18/2020 revealed significant interval reduction in previously seen bulky lymphadenopathy throughout the pelvis and retroperitoneum, c/w treatment response. There was interval increase in extensive sclerotic osseous metastatic disease throughout the included skeleton, likely reflecting post treatment change of existing osseous metastatic disease. Multiple subtle wedge and endplate deformities were unchanged, most notably a wedge deformity of L1 with approximately 50% height loss. There was prostatomegaly. There was coronary artery disease and aortic atherosclerosis.  PSAhas been followed: 32.82 on 12/22/2018, 33.08 on 12/30/2018, 122.41 on 05/27/2019, 2241.0 on 11/06/2019, 216 on 12/09/2019, 80.45 01/06/2020, 54.06 on 02/06/2020, 37.73 on 03/07/2020, 30.57 on 04/09/2020, 35.25 on 05/02/2020, 45.03 on 05/30/2020, 76.52 on 06/28/2020, 104.03 on 08/01/2020, 117.35 on 09/03/2020, 244.0 on 10/11/2020, and 467.0 on 11/14/2020.  Testosterone < 3 on 11/14/2020.   He has a monoclonal gammopathy of unknown significance  (MGUS). SPEPon 10/16/2019 revealed a 0.4 gm/dL + an additional 0.2 gm/dL biclonal IgAprotein with kappa specificity. Bone marrowon 11/14/2019 revealed a hypercellular bone marrow for age with metastatic carcinomac/w prostate cancer. There was slight plasmacytosis (4% plasma cells). Plasma cells displayed polyclonal staining for kappa and lambda light chains with kappa light chain excess. Findings were most suggestive of early involvement by a plasma cell neoplasm. Cytogenetics were normal (47, XX). 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  SPEP has been followed (gm/dL):  0.3 on 10/16/2019, 0.3 on 03/07/2020, and 0.2 on 08/01/2020.  He has a history of DVTin the upper deep calf vein in the region of the venous trifurcation on 07/04/2019.He was on Eliquis,but discontinued secondary to hematuria.IVC filter was placed on 11/07/2019.  He is hepatitis C positive.  Testing occurred on 10/17/2019.  Hepatitis C quantitative was not detected.  Given his age and prostate cancer, treatment for hepatitis C was not recommended.  He has stage IIIa chronic kidney disease. Creatinine is 1.4.  He has received both doses of the COVID-19 vaccine on 11/03/2019.   Symptomatically, he feels alright. He recently had 3 falls. He denies bone pain. He requires assistance with ADLs. Exam is stable.  Plan: 1.   Labs today: CBC with diff, CMP, PSA, testosterone. 2. Metastatic prostatic prostate cancer Symptomatically, he denies any complaints Hepresented with extensive pelvic, perirectal, retroperitoneal and mesenteric adenopathy. Hepresented with extensive osseous metastasis. He began degarelix and abiraterone + prednisone on 11/11/2019.     He has been receiving abiraterone at [facility].   He continues to deny any side effects associated with abiraterone.     He feels that he is taking abiraterone with prednisone.  Bone scan on 07/11/2020  and CT scans on 07/18/2020 revealed a significant response to treatment.  Discuss need for restaging studies given upward trend in PSA.  Continue degarelix monthly.    Continue abiraterone at current time unless future imaging documents progression.   Consider Omniseq testing and Invitae testing.   Given performance status, patient not a candidate for chemotherapy. Labs reviewed.  Degarelix today. Reschedule bone scan and CT scans.  Discuss symptom management.  Interventions are adequate.    3. Bone metastasis Patient with known bone metastasis.  Delton See remains pending secondary to dental clearance. 4.Normocytic anemia  Hematocrit 32.8.  Hemoglobin 10.7.  MCV 89.1 on 10/11/2020.  Hematocrit 30.4.  Hemoglobin   9.9.  MCV 88.1 on 11/14/2020.  Ferritin 288 with iron saturation 24% and TIBC 230 on 10/11/2020.  B12 494 and folate 28.0 (normal) on 10/11/2020.  Etiology may be secondary to chronic disease or prostate cancer. 5. Monoclonal gammopathyof unknown significance (MGUS) SPEP on 10/16/2019 revealed a 0.4 gm/dL + an additional 0.2 gm/dL biclonal IgA protein with kappa specificity. Bone marrow aspirate and biopsy on 11/15/2019 revealed a hypercellular bone marrow with metastatic carcinoma c/w prostate cancer.  There was slight plasmacytosis (4% plasma cells). Plasma cells werepolyclonal. SPEP was 0.3 gm/dL on 03/07/2020 and 0.2 gm/dL on 08/01/2020.  Check SPEP at next blood draw. 6. Left lower extremity DVT Duplex on 07/04/2019 revealedupper deep calf vein in the region of the venous trifurcation. IVC filterwas placed on 11/07/2019. He remains off anticoagulation secondary to hematuria.  Continue to monitor. 7.   Degarelix today. 8.   Reschedule CT and bone scan. 9.   RTC in 1 month for MD assessment, labs  (CBC with diff, CMP, SPEP, FLCA, PSA), review of imaging, and degarelix.  I discussed the assessment and treatment plan with the patient.  The patient was provided an opportunity to ask questions and all were answered.  The patient agreed with the plan and demonstrated an understanding of the instructions.  The patient was advised to call back if the symptoms worsen or if the condition fails to improve as anticipated.   Lequita Asal, MD, PhD    11/14/2020, 11:18 AM  Carleene Cooper, am acting as Education administrator for [employer]. Mike Gip, MD, PhD.  I, Diavian Furgason C. Mike Gip, MD, have reviewed the above documentation for accuracy and completeness, and I agree with the above.

## 2020-11-14 ENCOUNTER — Other Ambulatory Visit: Payer: Self-pay

## 2020-11-14 ENCOUNTER — Other Ambulatory Visit: Payer: Self-pay | Admitting: Hematology and Oncology

## 2020-11-14 ENCOUNTER — Telehealth: Payer: Self-pay | Admitting: Hematology and Oncology

## 2020-11-14 ENCOUNTER — Inpatient Hospital Stay: Payer: Medicare Other

## 2020-11-14 ENCOUNTER — Encounter: Payer: Self-pay | Admitting: Hematology and Oncology

## 2020-11-14 ENCOUNTER — Inpatient Hospital Stay: Payer: Medicare Other | Attending: Hematology and Oncology

## 2020-11-14 ENCOUNTER — Inpatient Hospital Stay (HOSPITAL_BASED_OUTPATIENT_CLINIC_OR_DEPARTMENT_OTHER): Payer: Medicare Other | Admitting: Hematology and Oncology

## 2020-11-14 VITALS — BP 144/58 | HR 63 | Temp 98.0°F | Resp 20 | Wt 248.6 lb

## 2020-11-14 DIAGNOSIS — C7951 Secondary malignant neoplasm of bone: Secondary | ICD-10-CM | POA: Diagnosis present

## 2020-11-14 DIAGNOSIS — C61 Malignant neoplasm of prostate: Secondary | ICD-10-CM

## 2020-11-14 DIAGNOSIS — D649 Anemia, unspecified: Secondary | ICD-10-CM | POA: Diagnosis not present

## 2020-11-14 DIAGNOSIS — D472 Monoclonal gammopathy: Secondary | ICD-10-CM

## 2020-11-14 DIAGNOSIS — I824Y2 Acute embolism and thrombosis of unspecified deep veins of left proximal lower extremity: Secondary | ICD-10-CM | POA: Diagnosis not present

## 2020-11-14 DIAGNOSIS — Z5111 Encounter for antineoplastic chemotherapy: Secondary | ICD-10-CM | POA: Insufficient documentation

## 2020-11-14 LAB — URINE CULTURE: Culture: >=100000 — AB

## 2020-11-14 LAB — CBC WITH DIFFERENTIAL/PLATELET
Abs Immature Granulocytes: 0.02 10*3/uL (ref 0.00–0.07)
Basophils Absolute: 0 10*3/uL (ref 0.0–0.1)
Basophils Relative: 0 %
Eosinophils Absolute: 0.1 10*3/uL (ref 0.0–0.5)
Eosinophils Relative: 2 %
HCT: 30.4 % — ABNORMAL LOW (ref 39.0–52.0)
Hemoglobin: 9.9 g/dL — ABNORMAL LOW (ref 13.0–17.0)
Immature Granulocytes: 0 %
Lymphocytes Relative: 13 %
Lymphs Abs: 0.8 10*3/uL (ref 0.7–4.0)
MCH: 28.7 pg (ref 26.0–34.0)
MCHC: 32.6 g/dL (ref 30.0–36.0)
MCV: 88.1 fL (ref 80.0–100.0)
Monocytes Absolute: 0.5 10*3/uL (ref 0.1–1.0)
Monocytes Relative: 8 %
Neutro Abs: 4.5 10*3/uL (ref 1.7–7.7)
Neutrophils Relative %: 77 %
Platelets: 171 10*3/uL (ref 150–400)
RBC: 3.45 MIL/uL — ABNORMAL LOW (ref 4.22–5.81)
RDW: 13.5 % (ref 11.5–15.5)
WBC: 5.9 10*3/uL (ref 4.0–10.5)
nRBC: 0 % (ref 0.0–0.2)

## 2020-11-14 LAB — COMPREHENSIVE METABOLIC PANEL
ALT: 20 U/L (ref 0–44)
AST: 23 U/L (ref 15–41)
Albumin: 3.1 g/dL — ABNORMAL LOW (ref 3.5–5.0)
Alkaline Phosphatase: 97 U/L (ref 38–126)
Anion gap: 9 (ref 5–15)
BUN: 14 mg/dL (ref 8–23)
CO2: 27 mmol/L (ref 22–32)
Calcium: 9 mg/dL (ref 8.9–10.3)
Chloride: 99 mmol/L (ref 98–111)
Creatinine, Ser: 1.24 mg/dL (ref 0.61–1.24)
GFR, Estimated: 57 mL/min — ABNORMAL LOW (ref >60)
Glucose, Bld: 208 mg/dL — ABNORMAL HIGH (ref 70–99)
Potassium: 3.4 mmol/L — ABNORMAL LOW (ref 3.5–5.1)
Sodium: 135 mmol/L (ref 135–145)
Total Bilirubin: 0.7 mg/dL (ref 0.3–1.2)
Total Protein: 6.2 g/dL — ABNORMAL LOW (ref 6.5–8.1)

## 2020-11-14 LAB — PSA: Prostatic Specific Antigen: 467 ng/mL — ABNORMAL HIGH (ref 0.00–4.00)

## 2020-11-14 MED ORDER — DEGARELIX ACETATE 80 MG ~~LOC~~ SOLR
SUBCUTANEOUS | Status: AC
  Filled 2020-11-14: qty 4

## 2020-11-14 MED ORDER — DEGARELIX ACETATE 80 MG ~~LOC~~ SOLR
80.0000 mg | Freq: Once | SUBCUTANEOUS | Status: AC
  Administered 2020-11-14: 80 mg via SUBCUTANEOUS

## 2020-11-14 NOTE — Telephone Encounter (Signed)
Oral Oncology Patient Advocate Encounter  Received notification from [place] [employer] [place] ([employer]) that patient has been temporarily enrolled into their program to receive Zytiga from the manufacturer at $0 out of pocket until 01/13/21. Once the medicare attestation form is signed and received he will have full approval until 10/19/21.   I have spoke with [name] about the approval.  Specialty Pharmacy that will dispense medication is [facility].  Patient knows to call the office with questions or concerns.   Oral Oncology Clinic will continue to follow.  [place] Patient [place] Phone [phone] Fax [phone] 11/14/2020 9:30 AM

## 2020-11-14 NOTE — Telephone Encounter (Signed)
Authorization for CT/Body Scan Per [facility] Auth# [id number] valid 10/31/20-12/15/20 for Bone [phone]

## 2020-11-14 NOTE — Telephone Encounter (Signed)
Received email from patients daughter, [name], wanting to check the status of application.  I called [name] and [name] to check the status of application and [name], rep, said that he would send a message for the application to be processed.  They have been very busy with renewals and new applications.  He said to check back in 24-48 hours for a status update.   I emailed [name] back to let her know the status of his application and to inform her of the copay if she did want to pay for his medication while waiting on patient assistance.  [place] Patient [place] Phone [phone] Fax [phone] 11/14/2020 8:39 AM

## 2020-11-14 NOTE — Progress Notes (Signed)
Pt drinking good, not eating as much because he does not always [facility]. He has fell 3 times on last sat. He hurt his left hand has a sore on it, then right hand still swollen from fall. He has swollen legs and he wears stockings and this is not a new thing for him.

## 2020-11-14 NOTE — Progress Notes (Signed)
Pt received prescribed treatment in clinic, pt stable at d/c. 

## 2020-11-14 NOTE — Telephone Encounter (Signed)
Faxed attestation to [employer].  [place] Patient [place] Phone [phone] Fax [phone] 11/14/2020 4:12 PM

## 2020-11-15 LAB — TESTOSTERONE: Testosterone: <3 ng/dL — ABNORMAL LOW (ref 264–916)

## 2020-11-19 NOTE — Telephone Encounter (Signed)
Received call from [name], patients daughter, that she had not heard from the pharmacy to get patients medication scheduled.  She said that the facility said he was getting low.  I called [employer] and rep could not see the pharmacy side and gave me the number to call [facility].  I also had him check to make sure the attestation form was received and he saw it and would send it for complete approval.  I called [facility] and rep stated they had tried calling on 1/26 and 1/28 but could not leave a message because voicemail was full.  I let [name] know they did try calling.  I added [name] to the call with [facility] so she could set up delivery of medication.  [place] Patient [place] Phone [phone] Fax [phone] 11/19/2020 4:10 PM

## 2020-11-28 ENCOUNTER — Other Ambulatory Visit: Payer: Self-pay

## 2020-11-28 ENCOUNTER — Encounter: Payer: Medicare Other | Attending: Internal Medicine | Admitting: Internal Medicine

## 2020-11-28 DIAGNOSIS — C782 Secondary malignant neoplasm of pleura: Secondary | ICD-10-CM | POA: Insufficient documentation

## 2020-11-28 DIAGNOSIS — J449 Chronic obstructive pulmonary disease, unspecified: Secondary | ICD-10-CM | POA: Insufficient documentation

## 2020-11-28 DIAGNOSIS — N183 Chronic kidney disease, stage 3 unspecified: Secondary | ICD-10-CM | POA: Diagnosis not present

## 2020-11-28 DIAGNOSIS — I130 Hypertensive heart and chronic kidney disease with heart failure and stage 1 through stage 4 chronic kidney disease, or unspecified chronic kidney disease: Secondary | ICD-10-CM | POA: Diagnosis not present

## 2020-11-28 DIAGNOSIS — E1122 Type 2 diabetes mellitus with diabetic chronic kidney disease: Secondary | ICD-10-CM | POA: Insufficient documentation

## 2020-11-28 DIAGNOSIS — E11622 Type 2 diabetes mellitus with other skin ulcer: Secondary | ICD-10-CM | POA: Diagnosis present

## 2020-11-28 DIAGNOSIS — C61 Malignant neoplasm of prostate: Secondary | ICD-10-CM | POA: Diagnosis not present

## 2020-11-28 DIAGNOSIS — I890 Lymphedema, not elsewhere classified: Secondary | ICD-10-CM | POA: Insufficient documentation

## 2020-11-28 DIAGNOSIS — Z86718 Personal history of other venous thrombosis and embolism: Secondary | ICD-10-CM | POA: Diagnosis not present

## 2020-11-28 DIAGNOSIS — I5032 Chronic diastolic (congestive) heart failure: Secondary | ICD-10-CM | POA: Insufficient documentation

## 2020-11-28 DIAGNOSIS — L97818 Non-pressure chronic ulcer of other part of right lower leg with other specified severity: Secondary | ICD-10-CM | POA: Diagnosis not present

## 2020-11-28 DIAGNOSIS — B182 Chronic viral hepatitis C: Secondary | ICD-10-CM | POA: Insufficient documentation

## 2020-11-28 DIAGNOSIS — L97828 Non-pressure chronic ulcer of other part of left lower leg with other specified severity: Secondary | ICD-10-CM | POA: Insufficient documentation

## 2020-11-28 NOTE — Progress Notes (Signed)
[name] ([id number]) Visit Report for 11/28/2020 Abuse/Suicide Risk Screen Details Patient Name: [name], [name] Date of Service: 11/28/2020 10:15 AM Medical Record Number: [id number] Patient Account Number: [id number] Date of Birth/Sex: 1932/05/12 (88 y.o. M) Treating RN: [name] Primary Care [name]: [name] Other Clinician: Referring [name]: Referral, Self Treating [name]/Extender: [name] in Treatment: 0 Abuse/Suicide Risk Screen Items Answer ABUSE RISK SCREEN: Has anyone close to you tried to hurt or harm you recentlyo No Do you feel uncomfortable with anyone in your familyo No Has anyone forced you do things that you didnot want to doo No Electronic Signature(s) Signed: 11/28/2020 2:46:48 PM By: [name], Brandon Breeding RN Entered By: [name], [name] on 11/28/2020 10:25:45 [name] ([id number]) -------------------------------------------------------------------------------- Activities of Daily Living Details Patient Name: [name] Date of Service: 11/28/2020 10:15 AM Medical Record Number: [id number] Patient Account Number: [id number] Date of Birth/Sex: 1932-05-17 (88 y.o. M) Treating RN: [name] Primary Care [name]: [name] Other Clinician: Referring [name]: Referral, Self Treating [name]/Extender: [name] in Treatment: 0 Activities of Daily Living Items Answer Activities of Daily Living (Please select one for each item) Drive Automobile Not Able Take Medications Need Assistance Use Telephone Need Assistance Care for Appearance Need Assistance Use Toilet Need Assistance Bath / Shower Need Assistance Dress Self Need Assistance Feed Self Need Assistance Walk Need Assistance Get In / Out Bed Need Assistance Housework Need Assistance Prepare Meals Need Assistance Handle Money Need Assistance Shop for Self Need Assistance Electronic Signature(s) Signed: 11/28/2020 2:46:48 PM By:  Brandon Nose RN Entered By: [name], [name] on 11/28/2020 10:26:06 [name] ([id number]) -------------------------------------------------------------------------------- Education Screening Details Patient Name: [name] Date of Service: 11/28/2020 10:15 AM Medical Record Number: [id number] Patient Account Number: [id number] Date of Birth/Sex: January 08, 1932 (88 y.o. M) Treating RN: [name] Primary Care [name]: [name] Other Clinician: Referring [name]: Referral, Self Treating [name]/Extender: [name] in Treatment: 0 Primary Learner Assessed: Patient Learning Preferences/Education Level/Primary Language Learning Preference: Explanation, Demonstration Highest Education Level: High School Preferred Language: English Cognitive Barrier Language Barrier: No Translator Needed: No Memory Deficit: No Emotional Barrier: No Cultural/Religious Beliefs Affecting Medical Care: No Physical Barrier Impaired Vision: No Impaired Hearing: No Decreased Hand dexterity: No Knowledge/Comprehension Knowledge Level: Medium Comprehension Level: Medium Ability to understand written instructions: Medium Ability to understand verbal instructions: Medium Motivation Anxiety Level: Calm Cooperation: Cooperative Education Importance: Acknowledges Need Interest in Health Problems: Asks Questions Perception: Coherent Willingness to Engage in Self-Management High Activities: Readiness to Engage in Self-Management High Activities: Electronic Signature(s) Signed: 11/28/2020 2:46:48 PM By: [name], Brandon Breeding RN Entered By: [name], [name] on 11/28/2020 10:26:58 [name] ([id number]) -------------------------------------------------------------------------------- Fall Risk Assessment Details Patient Name: [name] Date of Service: 11/28/2020 10:15 AM Medical Record Number: [id number] Patient Account Number: [id number] Date  of Birth/Sex: April 22, 1932 (88 y.o. M) Treating RN: [name] Primary Care Aliz [name]: [name] Other Clinician: Referring [name]: Referral, Self Treating [name]/Extender: [name] in Treatment: 0 Fall Risk Assessment Items Have you had 2 or more falls in the last 12 monthso 0 Yes Have you had any fall that resulted in injury in the last 12 monthso 0 Yes FALLS RISK SCREEN History of falling - immediate or within 3 months 25 Yes Secondary diagnosis (Do you have 2 or more medical diagnoseso) 15 Yes Ambulatory aid None/bed rest/wheelchair/nurse 0 No Crutches/cane/walker 15 Yes Furniture 0 No Intravenous therapy Access/Saline/Heparin Lock 0 No Gait/Transferring Normal/ bed rest/ wheelchair 0 No Weak (short  steps with or without shuffle, stooped but able to lift head while walking, may 0 No seek support from furniture) Impaired (short steps with shuffle, may have difficulty arising from chair, head down, impaired 20 Yes balance) Mental Status Oriented to own ability 0 Yes Electronic Signature(s) Signed: 11/28/2020 2:46:48 PM By: [name], Brandon Breeding RN Entered By: [name], [name] on 11/28/2020 10:27:44 [name] ([id number]) -------------------------------------------------------------------------------- Foot Assessment Details Patient Name: [name] Date of Service: 11/28/2020 10:15 AM Medical Record Number: [id number] Patient Account Number: [id number] Date of Birth/Sex: January 01, 1932 (88 y.o. M) Treating RN: [name] Primary Care Zahira Brummond: [name] Other Clinician: Referring [name]: Referral, Self Treating [name]/Extender: [name] in Treatment: 0 Foot Assessment Items Site Locations + = Sensation present, - = Sensation absent, C = Callus, U = Ulcer R = Redness, W = Warmth, M = Maceration, PU = Pre-ulcerative lesion F = Fissure, S = Swelling, D = Dryness Assessment Right: Left: Other Deformity: No No Prior  Foot Ulcer: No No Prior Amputation: No No Charcot Joint: No No Ambulatory Status: Non-ambulatory Assistance Device: Wheelchair Gait: Administrator, arts) Signed: 11/28/2020 2:46:48 PM By: [name], Brandon Breeding RN Entered By: [name], [name] on 11/28/2020 10:33:23 [name] ([id number]) -------------------------------------------------------------------------------- Nutrition Risk Screening Details Patient Name: [name] Date of Service: 11/28/2020 10:15 AM Medical Record Number: [id number] Patient Account Number: [id number] Date of Birth/Sex: 27-May-1932 (88 y.o. M) Treating RN: [name] Primary Care Aggie Douse: [name] Other Clinician: Referring Lessie Funderburke: Referral, Self Treating Kengo Sturges/Extender: [name] in Treatment: 0 Height (in): Weight (lbs): Body Mass Index (BMI): Nutrition Risk Screening Items Score Screening NUTRITION RISK SCREEN: I have an illness or condition that made me change the kind and/or amount of food I eat 0 No I eat fewer than two meals per day 3 Yes I eat few fruits and vegetables, or milk products 0 No I have three or more drinks of beer, liquor or wine almost every day 0 No I have tooth or mouth problems that make it hard for me to eat 0 No I don't always have enough money to buy the food I need 0 No I eat alone most of the time 0 No I take three or more different prescribed or over-the-counter drugs a day 1 Yes Without wanting to, I have lost or gained 10 pounds in the last six months 0 No I am not always physically able to shop, cook and/or feed myself 0 No Nutrition Protocols Good Risk Protocol Moderate Risk Protocol 0 Provide education on nutrition High Risk Proctocol Risk Level: Moderate Risk Score: 4 Electronic Signature(s) Signed: 11/28/2020 2:46:48 PM By: [name], Brandon Breeding RN Entered By: [name], [name] on 11/28/2020 10:28:26

## 2020-12-01 NOTE — Progress Notes (Signed)
JACOBIE, STAMEY ([id number]) Visit Report for 11/28/2020 HPI Details Patient Name: [name], [name] Date of Service: 11/28/2020 10:15 AM Medical Record Number: [id number] Patient Account Number: [id number] Date of Birth/Sex: 12-22-1931 (88 y.o. M) Treating RN: Cornell Barman Primary Care Provider: Lesia Hausen Other Clinician: Referring Provider: Referral, Self Treating Provider/Extender: Tito Dine in Treatment: 0 History of Present Illness HPI Description: 2510/15/2020 on evaluation today patient presents for initial evaluation here in our clinic concerning issues that he has been having with lymphedema for quite some time. He has intermittent issues with blistering of his lower extremities for which she will go to the wound center and typically they will wrap him and then subsequently this gets better. That is been an ongoing issue for quite some time. Currently a couple weeks ago he had blisters pop up on the left leg and the right leg currently there is a small spot open on the right but nothing on the left. He does have a history of coronary artery disease, COPD, diabetes mellitus type 2, and hypertension. Fortunately there is no signs of any cellulitis or active infection at this time. He has never had any lymphedema pumps and in fact did not even know what I was talking about when I mention this to him although he has been wrapped and had compression recommended for him long enough he may qualify for going ahead and initiating therapy with lymphedema pumps obviously this would have to be insurance approved. Fortunately there is no signs of any systemic infection either. No fevers, chills, nausea, vomiting, or diarrhea. He does have stage III lymphedema. 08/12/2019 on evaluation today patient appears to be doing well with regard to his lower extremities the edema is much better than it was during the last evaluation. Fortunately there is no signs of active infection which is great  news. No fevers, chills, nausea, vomiting, or diarrhea. Overall he has been tolerating the wraps without any complication and the wound seems to be doing better as well. We are still working on gathering all the information for ordering the lymphedema pumps at this point. He does need a plan for ongoing compression as well. 10/30; the patient seems to be making nice progress. He only has a small open wound remaining on the right lateral calf. Everything on the left is healed. He has his bilateral external compression wraps. 08/30/2019 on evaluation today patient's right lower extremity actually appears to be doing quite well and in fact appears to be completely healed based on what I am seeing at this time. He shows no signs of active infection which is good news. He does have a blister over the left lower extremity which was previously healed out and he has a Velcro compression stocking on this area. With that being said he does not seem to be doing poorly at all at this point which is good news. All in all however I do think we want to keep an eye on him for another couple weeks before completely discharging him. 09/13/2019 on evaluation today patient appears to be doing well with regard to his right lower extremity although this is very swollen on the left lower extremity he actually has an opening where the blistered area we were observing last week has opened up into an actual wound. Unfortunately this just does not seem to be doing quite as well as we would have liked. I was really hoping that he would be doing just fine today and that we would be able  to discharge him as healed today. Fortunately there is no signs of active infection at this point. The area of opening is very small he has very small blisters noted distal to the actual wound opening on the left lower extremity. 09/19/2019 upon evaluation today patient appears to be doing well with regard to his bilateral lower extremities and in  fact he appears to be completely healed at this time which is great news. He was also contacted by the lymphedema pump company and he has been approved apparently for the pumps and is ready to proceed from there as well. They are very happy about this. READMISSION 06/06/2020 This is a patient that we had in clinic in the fall. He had I believe wounds on his bilateral lower extremities. We healed this up with compression. He had some form of compression stocking and there was suggestion that we referred him for lymphedema pumps. The patient is telling me that he had the lymphedema pumps but has not used them in 5 months. He now lives in an assisted living and may have been previously he was at home. 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 His daughter called urgently this morning stating that he had to be seen urgently because of blisters and edema in his lower extremities. I think she felt that that this was all lymphedema issues. We had a cancellation so he is here. The patient came in from the facility but his daughter is not here. From review of the records that accompanied admitted. He has TED hose but has not been using any more advanced stocking or the lymphedema pumps. He is on Lasix 20 mg a day. Past medical history; I have looked over his past medical history. He has advanced prostate cancer with some degree of chronic renal insufficiency although his recent creatinine was 1.21. He has an IVC filter placed in January of this year secondary to inability to anticoagulate him because of recurrent hematuria and a history of DVT. He also has a history of diastolic congestive heart failure, COPD type 2 diabetes. ABIs in our clinic were 1.16 on the left and 1.11 on the 8/25; the patient with severe bilateral lymphedema. He also has a history of DVT and apparently has an IVC filter. He came in the clinic last week with severe  bilateral lower extremity edema. So much so that I elected to put him in bilateral 3 layer compression. I was worried that things were in a state of imminent breakdown. He has compression pumps but is now in an assisted living he is not using these. I am not sure if he is keeping his legs elevated. When he came in here he was not using compression stockings 9/1; this was a patient with severe bilateral lymphedema. He also has a history of a DVT and an IVC filter. He came in with severe bilateral edema nonpitting. We put him in 3 layer compression even though he only had 3 blistered areas on his bilateral anterior tibia. As it turned out he has external compression pumps which she is now using twice a day at the assisted living where he resides. His daughter reports they also have stockings. [name], [name] ([id number]) READMISSION 11/28/2020 This is a patient we had in the clinic in the recent past. He has lower extremity edema probably mostly lymphedema although he does have a history of DVT and has an IVC filter. He comes in  with an increase in swelling in his lower legs he has bilateral lower extremity blisters which are preulcerative. They usually respond to 4-layer compression and we discharged him in stockings and use of his compression pumps. He is in [facility] assisted living. He states that he is very compliant with the stockings and he has 4 pair. He says he is using the compression pump once a day. I do not have a good sense of when his lower extremity swelling started but he does have the usual presentation of tense blisters on the anterior lower legs. 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 We did not repeat his arterial ABIs but they have always been normal. Electronic Signature(s) Signed: 12/01/2020 5:30:54 AM By: Linton Ham MD Entered By: Linton Ham on 11/28/2020 11:13:34 [name], [name] ([id number]) -------------------------------------------------------------------------------- Physical Exam Details Patient Name: [name] Date of Service: 11/28/2020 10:15 AM Medical Record Number: [id number] Patient Account Number: [id number] Date of Birth/Sex: Nov 12, 1931 (88 y.o. M) Treating RN: Cornell Barman Primary Care Provider: Lesia Hausen Other Clinician: Referring Provider: Referral, Self Treating Provider/Extender: Tito Dine in Treatment: 0 Constitutional Sitting or standing Blood Pressure is within target range for patient.. Pulse regular and within target range for patient.Marland Kitchen Respirations regular, non- labored and within target range.. Temperature is normal and within the target range for the patient.Marland Kitchen appears in no distress. Cardiovascular Pedal pulses palpable. Very tense pitting edema in the bilateral lower extremities. This does not seem to extend above his knees. Gastrointestinal (GI) No ascites no masses no tenderness. No liver or spleen enlargement or tenderness.. Notes Wound exam; is no open area per se although he has tense blisters on the left medial left anterior and right anterior lower extremities. Tense partially pitting edema in his bilateral lower extremities as well. There is no real evidence of a DVT and that there is no warmth and calf tenderness. Electronic Signature(s) Signed: 12/01/2020 5:30:54 AM By: Linton Ham MD Entered By: Linton Ham on 11/28/2020 11:17:58 [name] ([id number]) -------------------------------------------------------------------------------- Physician Orders Details Patient Name: [name] Date of Service: 11/28/2020 10:15 AM Medical Record Number: [id number] Patient Account  Number: [id number] Date of Birth/Sex: [birth year] (88 y.o. M) Treating RN: Cornell Barman Primary Care Provider: Lesia Hausen Other Clinician: Referring Provider: Referral, Self Treating Provider/Extender: Tito Dine in Treatment: 0 Verbal / Phone Orders: No Diagnosis Coding Follow-up Appointments o Return Appointment in 1 week. [place]: o ADMIT to Home Health for wound care. May utilize formulary equivalent dressing for wound treatment orders unless otherwise specified. Home Health Nurse may visit PRN to address patientos wound care needs. o Scheduled days for dressing changes to be completed; exception, patient has scheduled wound care visit that day. o **Please direct any NON-WOUND related issues/requests for orders to patient's Primary Care Physician. **If current dressing causes regression in wound condition, may D/C ordered dressing product/s and apply Normal Saline Moist Dressing daily until next [place] or Other MD appointment. **Notify Wound Healing Center of regression in wound condition at [phone]. Bathing/ Shower/ Hygiene o May shower with wound dressing protected with water repellent cover or cast protector. Edema Control - Lymphedema / Segmental Compressive Device /  Other Bilateral Lower Extremities o Optional: One layer of unna paste to top of compression wrap (to act as an anchor). o 4 Layer Compression System (Left, Right, Bilateral) Lymphedema. o Elevate legs to the level of the heart and pump ankles as often as possible o Elevate leg(s) parallel to the floor when sitting. - Raise legs while in wheel chair with leg rests and pillow for support. Do not sit in wheelchair with legs on floor. o Compression Pump: Use compression pump on left lower extremity for 60 minutes, twice daily. - Increase to twice daily o Compression Pump: Use compression pump on right lower extremity for 60 minutes, twice  daily. - Increase to twice daily Additional Orders / Instructions o Follow Nutritious Diet and Increase Protein Intake Electronic Signature(s) Signed: 11/29/2020 8:38:29 AM By: Gretta Cool, BSN, RN, CWS, Kim RN, BSN Signed: 12/01/2020 5:30:54 AM By: Linton Ham MD Entered By: Gretta Cool, BSN, RN, CWS, Kim on 11/28/2020 11:05:38 [name], [name] ([id number]) -------------------------------------------------------------------------------- Problem List Details Patient Name: [name] Date of Service: 11/28/2020 10:15 AM Medical Record Number: [id number] Patient Account Number: [id number] Date of Birth/Sex: 09/06/1932 (88 y.o. M) Treating RN: Cornell Barman Primary Care Provider: Lesia Hausen Other Clinician: Referring Provider: Referral, Self Treating Provider/Extender: Tito Dine in Treatment: 0 Active Problems ICD-10 Encounter Code Description Active Date MDM Diagnosis I89.0 Lymphedema, not elsewhere classified 11/28/2020 No Yes L97.828 Non-pressure chronic ulcer of other part of left lower leg with other 11/28/2020 No Yes specified severity L97.818 Non-pressure chronic ulcer of other part of right lower leg with other 11/28/2020 No Yes specified severity Inactive Problems Resolved Problems Electronic Signature(s) Signed: 12/01/2020 5:30:54 AM By: Linton Ham MD Entered By: Linton Ham on 11/28/2020 11:10:16 [name] ([id number]) -------------------------------------------------------------------------------- Progress Note Details Patient Name: [name] Date of Service: 11/28/2020 10:15 AM Medical Record Number: [id number] Patient Account Number: [id number] Date of Birth/Sex: [birth year] (88 y.o. M) Treating RN: Cornell Barman Primary Care Provider: Lesia Hausen Other Clinician: Referring Provider: Referral, Self Treating Provider/Extender: Tito Dine in Treatment: 0 Subjective History of Present Illness (HPI) 2510/15/2020 on evaluation today  patient presents for initial evaluation here in our clinic concerning issues that he has been having with lymphedema for quite some time. He has intermittent issues with blistering of his lower extremities for which she will go to the wound center and typically they will wrap him and then subsequently this gets better. That is been an ongoing issue for quite some time. Currently a couple weeks ago he had blisters pop up on the left leg and the right leg currently there is a small spot open on the right but nothing on the left. He does have a history of coronary artery disease, COPD, diabetes mellitus type 2, and hypertension. Fortunately there is no signs of any cellulitis or active infection at this time. He has never had any lymphedema pumps and in fact did not even know what I was talking about when I mention this to him although he has been wrapped and had compression recommended for him long enough he may qualify for going ahead and initiating therapy with lymphedema pumps obviously this would have to be insurance approved. Fortunately there is no signs of any systemic infection either. No fevers, chills, nausea, vomiting, or diarrhea. He does have stage III lymphedema. 08/12/2019 on evaluation today patient appears to be doing well with regard to his lower extremities the edema is much better than it was during the last evaluation. Fortunately there is no  signs of active infection which is great news. No fevers, chills, nausea, vomiting, or diarrhea. Overall he has been tolerating the wraps without any complication and the wound seems to be doing better as well. We are still working on gathering all the information for ordering the lymphedema pumps at this point. He does need a plan for ongoing compression as well. 10/30; the patient seems to be making nice progress. He only has a small open wound remaining on the right lateral calf. Everything on the left is healed. He has his bilateral external  compression wraps. 08/30/2019 on evaluation today patient's right lower extremity actually appears to be doing quite well and in fact appears to be completely healed based on what I am seeing at this time. He shows no signs of active infection which is good news. He does have a blister over the left lower extremity which was previously healed out and he has a Velcro compression stocking on this area. With that being said he does not seem to be doing poorly at all at this point which is good news. All in all however I do think we want to keep an eye on him for another couple weeks before completely discharging him. 09/13/2019 on evaluation today patient appears to be doing well with regard to his right lower extremity although this is very swollen on the left lower extremity he actually has an opening where the blistered area we were observing last week has opened up into an actual wound. Unfortunately this just does not seem to be doing quite as well as we would have liked. I was really hoping that he would be doing just fine today and that we would be able to discharge him as healed today. Fortunately there is no signs of active infection at this point. The area of opening is very small he has very small blisters noted distal to the actual wound opening on the left lower extremity. 09/19/2019 upon evaluation today patient appears to be doing well with regard to his bilateral lower extremities and in fact he appears to be completely healed at this time which is great news. He was also contacted by the lymphedema pump company and he has been approved apparently for the pumps and is ready to proceed from there as well. They are very happy about this. READMISSION 06/06/2020 This is a patient that we had in clinic in the fall. He had I believe wounds on his bilateral lower extremities. We healed this up with compression. He had some form of compression stocking and there was suggestion that we referred  him for lymphedema pumps. The patient is telling me that he had the lymphedema pumps but has not used them in 5 months. He now lives in an assisted living and may have been previously he was at home. 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 His daughter called urgently this morning stating that he had to be seen urgently because of blisters and edema in his lower extremities. I think she felt that that this was all lymphedema issues. We had a cancellation so he is here. The patient came in from the facility but his daughter is not here. From review of the records that accompanied admitted. He has TED hose but has not been using any more advanced stocking or the lymphedema pumps. He is on Lasix 20 mg a day. Past medical history; I have looked over his past  medical history. He has advanced prostate cancer with some degree of chronic renal insufficiency although his recent creatinine was 1.21. He has an IVC filter placed in January of this year secondary to inability to anticoagulate him because of recurrent hematuria and a history of DVT. He also has a history of diastolic congestive heart failure, COPD type 2 diabetes. ABIs in our clinic were 1.16 on the left and 1.11 on the 8/25; the patient with severe bilateral lymphedema. He also has a history of DVT and apparently has an IVC filter. He came in the clinic last week with severe bilateral lower extremity edema. So much so that I elected to put him in bilateral 3 layer compression. I was worried that things were in a state of imminent breakdown. He has compression pumps but is now in an assisted living he is not using these. I am not sure if he is keeping his legs elevated. When he came in here he was not using compression stockings 9/1; this was a patient with severe bilateral lymphedema. He also has a history of a DVT and an IVC filter. He came in with severe bilateral  edema nonpitting. We put him in 3 layer compression even though he only had 3 blistered areas on his bilateral anterior tibia. As it turned out he has external compression pumps which she is now using twice a day at the assisted living where he resides. His daughter reports they also have stockings. READMISSION 11/28/2020 This is a patient we had in the clinic in the recent past. He has lower extremity edema probably mostly lymphedema although he does have a [place], [place] ([id number]) history of DVT and has an IVC filter. He comes in with an increase in swelling in his lower legs he has bilateral lower extremity blisters which are preulcerative. They usually respond to 4-layer compression and we discharged him in stockings and use of his compression pumps. He is in [facility] assisted living. He states that he is very compliant with the stockings and he has 4 pair. He says he is using the compression pump once a day. I do not have a good sense of when his lower extremity swelling started but he does have the usual presentation of tense blisters on the anterior lower legs. 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 We did not repeat his arterial ABIs but they have always been normal. Patient History Information obtained from Patient. Allergies metformin (Reaction: diarrhea) Family History Diabetes - Child, No family history of Cancer, Heart Disease, Hereditary Spherocytosis, Hypertension, Kidney Disease, Lung Disease, Seizures, Stroke, Thyroid Problems, Tuberculosis. Social History Former smoker - 2000, Marital Status - Widowed, Alcohol Use - Never - quit 2000, Drug Use - No History,  Caffeine Use - Daily. Medical History Eyes Patient has history of Glaucoma Denies history of Cataracts, Optic Neuritis Ear/Nose/Mouth/Throat Denies history of Chronic sinus problems/congestion, Middle ear problems Hematologic/Lymphatic Denies history of Anemia, Hemophilia, Human Immunodeficiency Virus, Lymphedema, Sickle Cell Disease Respiratory Patient has history of Chronic Obstructive Pulmonary Disease (COPD) Denies history of Aspiration, Asthma, Pneumothorax, Sleep Apnea, Tuberculosis Cardiovascular Patient has history of Coronary Artery Disease, Deep Vein Thrombosis, Hypertension, Myocardial Infarction Denies history of Angina, Arrhythmia, Congestive Heart  Failure, Hypotension, Peripheral Arterial Disease, Peripheral Venous Disease, Phlebitis, Vasculitis Gastrointestinal Denies history of Cirrhosis , Colitis, Crohn s, Hepatitis A, Hepatitis B, Hepatitis C Endocrine Patient has history of Type II Diabetes Denies history of Type I Diabetes Genitourinary Patient has history of End Stage Renal Disease - CKD Immunological Denies history of Lupus Erythematosus, Raynaud s, Scleroderma Integumentary (Skin) Denies history of History of Burn, History of pressure wounds Musculoskeletal Patient has history of Gout, Osteoarthritis Denies history of Rheumatoid Arthritis, Osteomyelitis Neurologic Patient has history of Neuropathy Denies history of Dementia, Quadriplegia, Paraplegia, Seizure Disorder Oncologic Patient has history of Received Chemotherapy - oral Denies history of Received Radiation Psychiatric Denies history of Anorexia/bulimia, Confinement Anxiety Hospitalization/Surgery History - appendectomy. - belpharoptosis repair. - eye surgery. Medical And Surgical History Notes Cardiovascular HLD, venous stasis Integumentary (Skin) history of venous ulcers Review of Systems (ROS) Eyes Denies complaints or symptoms of Dry Eyes, Vision Changes, Glasses /  Contacts. Ear/Nose/Mouth/Throat Denies complaints or symptoms of Difficult clearing ears, Sinusitis. Hematologic/Lymphatic REO, PORTELA ([id number]) Denies complaints or symptoms of Bleeding / Clotting Disorders, Human Immunodeficiency Virus. Respiratory Denies complaints or symptoms of Chronic or frequent coughs, Shortness of Breath. Cardiovascular Denies complaints or symptoms of Chest pain, LE edema. Gastrointestinal Denies complaints or symptoms of Frequent diarrhea, Nausea, Vomiting. Genitourinary Denies complaints or symptoms of Kidney failure/ Dialysis, Incontinence/dribbling. Immunological Denies complaints or symptoms of Hives, Itching. Integumentary (Skin) Complains or has symptoms of Wounds, Breakdown, Swelling. Denies complaints or symptoms of Bleeding or bruising tendency. Musculoskeletal Complains or has symptoms of Muscle Weakness. Denies complaints or symptoms of Muscle Pain. Neurologic Denies complaints or symptoms of Numbness/parasthesias, Focal/Weakness. Psychiatric Denies complaints or symptoms of Anxiety, Claustrophobia. Objective Constitutional Sitting or standing Blood Pressure is within target range for patient.. Pulse regular and within target range for patient.Marland Kitchen Respirations regular, non- labored and within target range.. Temperature is normal and within the target range for the patient.Marland Kitchen appears in no distress. Vitals Time Taken: 10:22 AM, Temperature: 98.1 F, Pulse: 65 bpm, Respiratory Rate: 18 breaths/min, Blood Pressure: 113/66 mmHg. Cardiovascular Pedal pulses palpable. Very tense pitting edema in the bilateral lower extremities. This does not seem to extend above his knees. Gastrointestinal (GI) No ascites no masses no tenderness. No liver or spleen enlargement or tenderness.. General Notes: Wound exam; is no open area per se although he has tense blisters on the left medial left anterior and right anterior lower extremities. Tense partially  pitting edema in his bilateral lower extremities as well. There is no real evidence of a DVT and that there is no warmth and calf tenderness. 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 Post procedure Diagnosis Wound #: Same as Pre-Procedure [name], [name] ([id number]) Plan Follow-up Appointments: Return Appointment in 1 week. Home Health: [place]: ADMIT to [facility] for wound care. May utilize formulary equivalent dressing for wound treatment orders unless otherwise specified. Home Health Nurse may visit PRN to address patient s wound care needs. Scheduled days for dressing changes to be completed; exception, patient has scheduled wound care visit that day. **Please direct any NON-WOUND related issues/requests for orders to patient's Primary Care Physician. **If current dressing causes regression in wound condition, may D/C ordered dressing product/s and apply  Normal Saline Moist Dressing daily until next [facility] [place] or Other MD appointment. **Notify [facility] of regression in wound condition at [phone]. Bathing/ Shower/ Hygiene: May shower with wound dressing protected with water repellent cover or cast protector. Edema Control - Lymphedema / Segmental Compressive Device / Other: Optional: One layer of unna paste to top of compression wrap (to act as an anchor). 4 Layer Compression System (Left, Right, Bilateral) Lymphedema. Elevate legs to the level of the heart and pump ankles as often as possible Elevate leg(s) parallel to the floor when sitting. - Raise legs while in wheel chair with leg rests and pillow for support. Do not sit  in wheelchair with legs on floor. Compression Pump: Use compression pump on left lower extremity for 60 minutes, twice daily. - Increase to twice daily Compression Pump: Use compression pump on right lower extremity for 60 minutes, twice daily. - Increase to twice daily Additional Orders / Instructions: Follow Nutritious Diet and Increase Protein Intake 1. Much the same presentation as previous stays in this clinic 2. We put him in bilateral compression wraps 4-layer. This usually gets this under control and then we discharged him to stockings and pumps 3. Have asked him to bring his stockings next week. When he left last time I had suggested the compression pumps twice a day but the patient is quite clear they are only doing this once a day. Twice a day may be necessary 4. If I remember correctly the patient had to stop anticoagulation because of hematuria. He does have an IVC filter. Is possible he has more clot burden in his venous system but I will need to review the history here. Depending on the severity of the hematuria reintroduction of anticoagulation may may not be possible even if he does have worsening clot burden. His metastatic cancer really does put him at increased risk for DVT 5. He does have a history of diastolic congestive heart failure I see none of this at the bedside. As stated nor do I see any edema above his knees Electronic Signature(s) Signed: 12/01/2020 5:30:54 AM By: Linton Ham MD Entered By: Linton Ham on 11/28/2020 11:20:20 [name], [name] ([id number]) -------------------------------------------------------------------------------- ROS/PFSH Details Patient Name: [name] Date of Service: 11/28/2020 10:15 AM Medical Record Number: [id number] Patient Account Number: [id number] Date of Birth/Sex: 11-07-1931 (88 y.o. M) Treating RN: Dolan Amen Primary Care Provider: Lesia Hausen Other Clinician: Referring Provider: Referral, Self Treating  Provider/Extender: Tito Dine in Treatment: 0 Information Obtained From Patient Eyes Complaints and Symptoms: Negative for: Dry Eyes; Vision Changes; Glasses / Contacts Medical History: Positive for: Glaucoma Negative for: Cataracts; Optic Neuritis Ear/Nose/Mouth/Throat Complaints and Symptoms: Negative for: Difficult clearing ears; Sinusitis Medical History: Negative for: Chronic sinus problems/congestion; Middle ear problems Hematologic/Lymphatic Complaints and Symptoms: Negative for: Bleeding / Clotting Disorders; Human Immunodeficiency Virus Medical History: Negative for: Anemia; Hemophilia; Human Immunodeficiency Virus; Lymphedema; Sickle Cell Disease Respiratory Complaints and Symptoms: Negative for: Chronic or frequent coughs; Shortness of Breath Medical History: Positive for: Chronic Obstructive Pulmonary Disease (COPD) Negative for: Aspiration; Asthma; Pneumothorax; Sleep Apnea; Tuberculosis Cardiovascular Complaints and Symptoms: Negative for: Chest pain; LE edema Medical History: Positive for: Coronary Artery Disease; Deep Vein Thrombosis; Hypertension; Myocardial Infarction Negative for: Angina; Arrhythmia; Congestive Heart Failure; Hypotension; Peripheral Arterial Disease; Peripheral Venous Disease; Phlebitis; Vasculitis Past Medical History Notes: HLD, venous stasis Gastrointestinal Complaints and Symptoms: Negative for: Frequent diarrhea; Nausea; Vomiting Medical History: Negative for: Cirrhosis ; Colitis; Crohnos; Hepatitis A; Hepatitis B; Hepatitis  C Genitourinary Complaints and Symptoms: Negative for: Kidney failure/ Dialysis; Incontinence/dribbling [name], [name] ([id number]) Medical History: Positive for: End Stage Renal Disease - CKD Immunological Complaints and Symptoms: Negative for: Hives; Itching Medical History: Negative for: Lupus Erythematosus; Raynaudos; Scleroderma Integumentary (Skin) Complaints and Symptoms: Positive  for: Wounds; Breakdown; Swelling Negative for: Bleeding or bruising tendency Medical History: Negative for: History of Burn; History of pressure wounds Past Medical History Notes: history of venous ulcers Musculoskeletal Complaints and Symptoms: Positive for: Muscle Weakness Negative for: Muscle Pain Medical History: Positive for: Gout; Osteoarthritis Negative for: Rheumatoid Arthritis; Osteomyelitis Neurologic Complaints and Symptoms: Negative for: Numbness/parasthesias; Focal/Weakness Medical History: Positive for: Neuropathy Negative for: Dementia; Quadriplegia; Paraplegia; Seizure Disorder Psychiatric Complaints and Symptoms: Negative for: Anxiety; Claustrophobia Medical History: Negative for: Anorexia/bulimia; Confinement Anxiety Endocrine Medical History: Positive for: Type II Diabetes Negative for: Type I Diabetes Time with diabetes: 5 years Treated with: Insulin Blood sugar tested every day: Yes Tested : TID Oncologic Medical History: Positive for: Received Chemotherapy - oral Negative for: Received Radiation HBO Extended History Items Eyes: Glaucoma Immunizations [name], [name] ([id number]) Pneumococcal Vaccine: Received Pneumococcal Vaccination: Yes Immunization Notes: up to date Implantable Devices None Hospitalization / Surgery History Type of Hospitalization/Surgery appendectomy belpharoptosis repair eye surgery Family and Social History Cancer: No; Diabetes: Yes - Child; Heart Disease: No; Hereditary Spherocytosis: No; Hypertension: No; Kidney Disease: No; Lung Disease: No; Seizures: No; Stroke: No; Thyroid Problems: No; Tuberculosis: No; Former smoker - 2000; Marital Status - Widowed; Alcohol Use: Never - quit 2000; Drug Use: No History; Caffeine Use: Daily; Financial Concerns: No; Food, Clothing or Shelter Needs: No; Support System Lacking: No; Transportation Concerns: No Electronic Signature(s) Signed: 11/28/2020 2:46:48 PM By: Georges Mouse, Minus Breeding RN Signed: 12/01/2020 5:30:54 AM By: Linton Ham MD Entered By: Georges Mouse, Minus Breeding on 11/28/2020 10:25:36 [name], [name] ([id number]) -------------------------------------------------------------------------------- SuperBill Details Patient Name: [name] Date of Service: 11/28/2020 Medical Record Number: [id number] Patient Account Number: [id number] Date of Birth/Sex: [birth year] (88 y.o. M) Treating RN: Cornell Barman Primary Care Provider: Lesia Hausen Other Clinician: Referring Provider: Referral, Self Treating Provider/Extender: Tito Dine in Treatment: 0 Diagnosis Coding ICD-10 Codes Code Description I89.0 Lymphedema, not elsewhere classified L97.828 Non-pressure chronic ulcer of other part of left lower leg with other specified severity L97.818 Non-pressure chronic ulcer of other part of right lower leg with other specified severity Facility Procedures CPT4: Description Modifier Quantity Code [id number] - WOUND CARE VISIT-LEV 3 EST PT 1 CPT4: [id number] BILATERAL: Application of multi-layer venous compression system; leg (below knee), including 1 ankle and foot. Physician Procedures CPT4 Code: 3976734 Description: 19379 - WC PHYS LEVEL 4 - EST PT Modifier: Quantity: 1 CPT4 Code: Description: ICD-10 Diagnosis Description I89.0 Lymphedema, not elsewhere classified L97.828 Non-pressure chronic ulcer of other part of left lower leg with other speci L97.818 Non-pressure chronic ulcer of other part of right lower leg with other spec Modifier: fied severity ified severity Quantity: Electronic Signature(s) Signed: 12/01/2020 5:30:54 AM By: Linton Ham MD Entered By: Linton Ham on 11/28/2020 11:20:54

## 2020-12-01 NOTE — Progress Notes (Signed)
PASTOR, SGRO ([id number]) Visit Report for 11/28/2020 Allergy List Details Patient Name: [name], [name] Date of Service: 11/28/2020 10:15 AM Medical Record Number: [id number] Patient Account Number: [id number] Date of Birth/Sex: 10-23-1931 (87 y.o. M) Treating RN: Dolan Amen Primary Care Elliette Seabolt: Lesia Hausen Other Clinician: Referring Chet Greenley: Referral, Self Treating Shenee Wignall/Extender: Ricard Dillon Weeks in Treatment: 0 Allergies Active Allergies metformin Reaction: diarrhea Type: Food Allergy Notes Electronic Signature(s) Signed: 11/28/2020 2:46:48 PM By: Georges Mouse, Minus Breeding RN Entered By: Georges Mouse, Minus Breeding on 11/28/2020 10:24:24 [name] ([id number]) -------------------------------------------------------------------------------- Arrival Information Details Patient Name: [name] Date of Service: 11/28/2020 10:15 AM Medical Record Number: [id number] Patient Account Number: [id number] Date of Birth/Sex: 1932-03-25 (87 y.o. M) Treating RN: Dolan Amen Primary Care Bhavesh Vazquez: Lesia Hausen Other Clinician: Referring Keyia Moretto: Referral, Self Treating Wallace Cogliano/Extender: Tito Dine in Treatment: 0 Visit Information Patient Arrived: Wheel Chair Arrival Time: 10:21 Accompanied By: self Transfer Assistance: EasyPivot Patient Lift Patient Identification Verified: Yes Secondary Verification Process Completed: Yes Patient Has Alerts: Yes Patient Alerts: Type II Diabetes 06/06/2020 ABI L) 1.13 R) 1.11 History Since Last Visit Electronic Signature(s) Signed: 11/28/2020 4:10:32 PM By: Gretta Cool, BSN, RN, CWS, Kim RN, BSN Previous Signature: 11/28/2020 2:46:48 PM Version By: Georges Mouse, Minus Breeding RN Entered By: Gretta Cool, BSN, RN, CWS, Kim on 11/28/2020 16:10:32 [name] ([id number]) -------------------------------------------------------------------------------- Clinic Level of Care Assessment Details Patient Name: [name] Date of  Service: 11/28/2020 10:15 AM Medical Record Number: [id number] Patient Account Number: [id number] Date of Birth/Sex: December 07, 1931 (87 y.o. M) Treating RN: Cornell Barman Primary Care Bao Coreas: Lesia Hausen Other Clinician: Referring Candia Kingsbury: Referral, Self Treating Jaia Alonge/Extender: Tito Dine in Treatment: 0 Clinic Level of Care Assessment Items TOOL 2 Quantity Score []  - Use when only an EandM is performed on the INITIAL visit 0 ASSESSMENTS - Nursing Assessment / Reassessment X - General Physical Exam (combine w/ comprehensive assessment (listed just below) when performed on new 1 20 pt. evals) X- 1 25 Comprehensive Assessment (HX, ROS, Risk Assessments, Wounds Hx, etc.) ASSESSMENTS - Wound and Skin Assessment / Reassessment []  - Simple Wound Assessment / Reassessment - one wound 0 []  - 0 Complex Wound Assessment / Reassessment - multiple wounds X- 1 10 Dermatologic / Skin Assessment (not related to wound area) ASSESSMENTS - Ostomy and/or Continence Assessment and Care []  - Incontinence Assessment and Management 0 []  - 0 Ostomy Care Assessment and Management (repouching, etc.) PROCESS - Coordination of Care []  - Simple Patient / Family Education for ongoing care 0 X- 1 20 Complex (extensive) Patient / Family Education for ongoing care X- 1 10 Staff obtains Programmer, systems, Records, Test Results / Process Orders []  - 0 Staff telephones HHA, Nursing Homes / Clarify orders / etc []  - 0 Routine Transfer to another Facility (non-emergent condition) []  - 0 Routine Hospital Admission (non-emergent condition) []  - 0 New Admissions / Biomedical engineer / Ordering NPWT, Apligraf, etc. []  - 0 Emergency Hospital Admission (emergent condition) []  - 0 Simple Discharge Coordination X- 1 15 Complex (extensive) Discharge Coordination PROCESS - Special Needs []  - Pediatric / Minor Patient Management 0 []  - 0 Isolation Patient Management []  - 0 Hearing / Language / Visual  special needs []  - 0 Assessment of Community assistance (transportation, D/C planning, etc.) []  - 0 Additional assistance / Altered mentation []  - 0 Support Surface(s) Assessment (bed, cushion, seat, etc.) INTERVENTIONS - Wound Cleansing / Measurement []  - Wound Imaging (photographs - any number of wounds) 0 []  - 0 Wound Tracing (instead of  photographs) []  - 0 Simple Wound Measurement - one wound []  - 0 Complex Wound Measurement - multiple wounds Milledge, Petar ([id number]) []  - 0 Simple Wound Cleansing - one wound []  - 0 Complex Wound Cleansing - multiple wounds INTERVENTIONS - Wound Dressings []  - Small Wound Dressing one or multiple wounds 0 []  - 0 Medium Wound Dressing one or multiple wounds []  - 0 Large Wound Dressing one or multiple wounds []  - 0 Application of Medications - injection INTERVENTIONS - Miscellaneous []  - External ear exam 0 []  - 0 Specimen Collection (cultures, biopsies, blood, body fluids, etc.) []  - 0 Specimen(s) / Culture(s) sent or taken to Lab for analysis []  - 0 Patient Transfer (multiple staff / Civil Service fast streamer / Similar devices) []  - 0 Simple Staple / Suture removal (25 or less) []  - 0 Complex Staple / Suture removal (26 or more) []  - 0 Hypo / Hyperglycemic Management (close monitor of Blood Glucose) []  - 0 Ankle / Brachial Index (ABI) - do not check if billed separately Has the patient been seen at the hospital within the last three years: Yes Total Score: 100 Level Of Care: New/Established - Level 3 Electronic Signature(s) Signed: 11/29/2020 8:38:29 AM By: Gretta Cool, BSN, RN, CWS, Kim RN, BSN Entered By: Gretta Cool, BSN, RN, CWS, Kim on 11/28/2020 11:07:48 [name] ([id number]) -------------------------------------------------------------------------------- Compression Therapy Details Patient Name: [name] Date of Service: 11/28/2020 10:15 AM Medical Record Number: [id number] Patient Account Number: [id number] Date of Birth/Sex:  27-Sep-1932 (87 y.o. M) Treating RN: Cornell Barman Primary Care Darice Vicario: Lesia Hausen Other Clinician: Referring Jenni Thew: Referral, Self Treating Nimco Bivens/Extender: Tito Dine in Treatment: 0 Compression Therapy Performed for Wound Assessment: NonWound Condition Lymphedema - Bilateral Leg Performed By: Clinician Cornell Barman, RN Compression Type: Four Layer Pre Treatment ABI: 1.1 Post Procedure Diagnosis Same as Pre-procedure Electronic Signature(s) Signed: 11/29/2020 8:38:29 AM By: Gretta Cool, BSN, RN, CWS, Kim RN, BSN Entered By: Gretta Cool, BSN, RN, CWS, Kim on 11/28/2020 11:02:01 [name] ([id number]) -------------------------------------------------------------------------------- Encounter Discharge Information Details Patient Name: [name] Date of Service: 11/28/2020 10:15 AM Medical Record Number: [id number] Patient Account Number: [id number] Date of Birth/Sex: 08/31/1932 (88 y.o. M) Treating RN: Cornell Barman Primary Care Armonie Staten: Lesia Hausen Other Clinician: Referring Kynlei Piontek: Referral, Self Treating Ricca Melgarejo/Extender: Tito Dine in Treatment: 0 Encounter Discharge Information Items Discharge Condition: Stable Ambulatory Status: Wheelchair Discharge Destination: Home Transportation: Private Auto Accompanied By: self Schedule Follow-up Appointment: Yes Clinical Summary of Care: Electronic Signature(s) Signed: 11/29/2020 8:38:29 AM By: Gretta Cool, BSN, RN, CWS, Kim RN, BSN Entered By: Gretta Cool, BSN, RN, CWS, Kim on 11/28/2020 11:09:30 [name] ([id number]) -------------------------------------------------------------------------------- Lower Extremity Assessment Details Patient Name: [name] Date of Service: 11/28/2020 10:15 AM Medical Record Number: [id number] Patient Account Number: [id number] Date of Birth/Sex: 05-08-1932 (88 y.o. M) Treating RN: Dolan Amen Primary Care Joselyne Spake: Lesia Hausen Other Clinician: Referring Elenie Coven:  Referral, Self Treating Eilish Mcdaniel/Extender: Ricard Dillon Weeks in Treatment: 0 Edema Assessment Assessed: [Left: Yes] [Right: Yes] Edema: [Left: Yes] [Right: Yes] Calf Left: Right: Point of Measurement: 41 cm From Medial Instep 46 cm 44 cm Ankle Left: Right: Point of Measurement: 10 cm From Medial Instep 32 cm 32 cm Electronic Signature(s) Signed: 11/28/2020 2:46:48 PM By: Georges Mouse, Minus Breeding RN Entered By: Georges Mouse, Minus Breeding on 11/28/2020 10:34:24 [name] ([id number]) -------------------------------------------------------------------------------- Multi Wound Chart Details Patient Name: [name] Date of Service: 11/28/2020 10:15 AM Medical Record Number: [id number] Patient Account Number: [id number] Date of Birth/Sex: [birth year] (88 y.o. M) Treating RN:  Cornell Barman Primary Care Jaqulyn Chancellor: Lesia Hausen Other Clinician: Referring Airabella Barley: Referral, Self Treating Zarion Oliff/Extender: Tito Dine in Treatment: 0 Vital Signs Height(in): Pulse(bpm): 65 Weight(lbs): Blood Pressure(mmHg): 113/66 Body Mass Index(BMI): Temperature(F): 98.1 Respiratory Rate(breaths/min): 18 Wound Assessments Treatment Notes Electronic Signature(s) Signed: 12/01/2020 5:30:54 AM By: Linton Ham MD Entered By: Linton Ham on 11/28/2020 11:10:24 [name] ([id number]) -------------------------------------------------------------------------------- Multi-Disciplinary Care Plan Details Patient Name: [name] Date of Service: 11/28/2020 10:15 AM Medical Record Number: [id number] Patient Account Number: [id number] Date of Birth/Sex: 09/07/1932 (88 y.o. M) Treating RN: Cornell Barman Primary Care Trudee Chirino: Lesia Hausen Other Clinician: Referring Thia Olesen: Referral, Self Treating Dorlene Footman/Extender: Tito Dine in Treatment: 0 Active Inactive Orientation to the Wound Care Program Nursing Diagnoses: Knowledge deficit related to the wound healing  center program Goals: Patient/caregiver will verbalize understanding of the [facility] Program Date Initiated: 11/28/2020 Target Resolution Date: 11/28/2020 Goal Status: Active Interventions: Provide education on orientation to the wound center Notes: Venous Leg Ulcer Nursing Diagnoses: Actual venous Insuffiency (use after diagnosis is confirmed) Goals: Patient will maintain optimal edema control Date Initiated: 11/28/2020 Target Resolution Date: 12/12/2020 Goal Status: Active Patient/caregiver will verbalize understanding of disease process and disease management Date Initiated: 11/28/2020 Target Resolution Date: 12/12/2020 Goal Status: Active Verify adequate tissue perfusion prior to therapeutic compression application Date Initiated: 11/28/2020 Target Resolution Date: 12/12/2020 Goal Status: Active Interventions: Assess peripheral edema status every visit. Treatment Activities: Therapeutic compression applied : 11/28/2020 Notes: Wound/Skin Impairment Nursing Diagnoses: Impaired tissue integrity Goals: Patient/caregiver will verbalize understanding of skin care regimen Date Initiated: 11/28/2020 Target Resolution Date: 12/12/2020 Goal Status: Active Interventions: Assess patient/caregiver ability to perform ulcer/skin care regimen upon admission and as needed Treatment Activities: Skin care regimen initiated : 11/28/2020 HARACE, MCCLUNEY ([id number]) Notes: Electronic Signature(s) Signed: 11/29/2020 8:38:29 AM By: Gretta Cool, BSN, RN, CWS, Kim RN, BSN Entered By: Gretta Cool, BSN, RN, CWS, Kim on 11/28/2020 10:59:19 [name] ([id number]) -------------------------------------------------------------------------------- Non-Wound Condition Assessment Details Patient Name: [name] Date of Service: 11/28/2020 10:15 AM Medical Record Number: [id number] Patient Account Number: [id number] Date of Birth/Sex: Jun 01, 1932 (88 y.o. M) Treating RN: Cornell Barman Primary Care Saadiq Poche:  Lesia Hausen Other Clinician: Referring Jacklin Zwick: Referral, Self Treating Imberly Troxler/Extender: Tito Dine in Treatment: 0 Non-Wound Condition: Condition: Lymphedema Location: Leg Side: Bilateral Notes: significant swelling in both legs today with reported increase in pain. Notes Swelling in both legs with sporadic blisters. Electronic Signature(s) Signed: 11/29/2020 8:38:29 AM By: Gretta Cool, BSN, RN, CWS, Kim RN, BSN Entered By: Gretta Cool, BSN, RN, CWS, Kim on 11/28/2020 11:01:19 [name] ([id number]) -------------------------------------------------------------------------------- Pain Assessment Details Patient Name: [name] Date of Service: 11/28/2020 10:15 AM Medical Record Number: [id number] Patient Account Number: [id number] Date of Birth/Sex: [birth year] (88 y.o. M) Treating RN: Dolan Amen Primary Care Cordero Surette: Lesia Hausen Other Clinician: Referring Claudean Leavelle: Referral, Self Treating Kirby Argueta/Extender: Tito Dine in Treatment: 0 Active Problems Location of Pain Severity and Description of Pain Patient Has Paino No Site Locations Rate the pain. Current Pain Level: 0 Pain Management and Medication Current Pain Management: Electronic Signature(s) Signed: 11/28/2020 2:46:48 PM By: Georges Mouse, Minus Breeding RN Entered By: Georges Mouse, Minus Breeding on 11/28/2020 10:21:47 ALIOU, MEALEY ([id number]) -------------------------------------------------------------------------------- Vitals Details Patient Name: [name] Date of Service: 11/28/2020 10:15 AM Medical Record Number: [id number] Patient Account Number: [id number] Date of Birth/Sex: Jun 17, 1932 (88 y.o. M) Treating RN: Dolan Amen Primary Care Shiryl Ruddy: Lesia Hausen Other Clinician: Referring Aima Mcwhirt: Referral, Self Treating Chandlar Staebell/Extender: Ricard Dillon Weeks in Treatment: 0 Vital Signs  Time Taken: 10:22 Temperature (F): 98.1 Pulse (bpm): 65 Respiratory Rate  (breaths/min): 18 Blood Pressure (mmHg): 113/66 Reference Range: 80 - 120 mg / dl Electronic Signature(s) Signed: 11/28/2020 2:46:48 PM By: Georges Mouse, Minus Breeding RN Entered By: Georges Mouse, Minus Breeding on 11/28/2020 10:24:15

## 2020-12-04 ENCOUNTER — Ambulatory Visit
Admission: RE | Admit: 2020-12-04 | Discharge: 2020-12-04 | Disposition: A | Discharge status: Home / Self Care | Payer: Medicare Other | Source: Ambulatory Visit | Attending: Hematology and Oncology | Admitting: Hematology and Oncology

## 2020-12-04 ENCOUNTER — Other Ambulatory Visit: Payer: Self-pay | Admitting: Hematology and Oncology

## 2020-12-04 ENCOUNTER — Other Ambulatory Visit: Payer: Medicare Other

## 2020-12-04 ENCOUNTER — Encounter
Admission: RE | Admit: 2020-12-04 | Discharge: 2020-12-04 | Disposition: A | Discharge status: Home / Self Care | Payer: Medicare Other | Source: Ambulatory Visit | Attending: Hematology and Oncology | Admitting: Hematology and Oncology

## 2020-12-04 ENCOUNTER — Other Ambulatory Visit: Payer: Self-pay

## 2020-12-04 DIAGNOSIS — C61 Malignant neoplasm of prostate: Secondary | ICD-10-CM

## 2020-12-04 DIAGNOSIS — C7951 Secondary malignant neoplasm of bone: Secondary | ICD-10-CM | POA: Insufficient documentation

## 2020-12-04 MED ORDER — TECHNETIUM TC 99M MEDRONATE IV KIT
20.0000 | PACK | Freq: Once | INTRAVENOUS | Status: AC | PRN
  Administered 2020-12-04: 20.628 via INTRAVENOUS

## 2020-12-05 ENCOUNTER — Other Ambulatory Visit: Payer: Self-pay | Admitting: Hematology and Oncology

## 2020-12-05 ENCOUNTER — Telehealth: Payer: Self-pay | Admitting: *Deleted

## 2020-12-05 ENCOUNTER — Encounter: Payer: Medicare Other | Admitting: Internal Medicine

## 2020-12-05 DIAGNOSIS — E11622 Type 2 diabetes mellitus with other skin ulcer: Secondary | ICD-10-CM | POA: Diagnosis not present

## 2020-12-05 DIAGNOSIS — M899 Disorder of bone, unspecified: Secondary | ICD-10-CM

## 2020-12-05 NOTE — Telephone Encounter (Signed)
Called report  IMPRESSION: Osseous metastases within ribs, spine, pelvis, sternum, and RIGHT humeral diaphysis.  When compared to the previous exam, new lesions are seen within the ribs and RIGHT humeral diaphysis as well as progressive uptake in the RIGHT acromion and RIGHT acetabulum.  RIGHT humeral radiographs recommended to exclude lesion at risk for pathologic fracture.  These results will be called to the ordering clinician or representative by the Radiologist Assistant, and communication documented in the PACS or [employer].   Electronically Signed   By: Lavonia Dana M.D.   On: 12/05/2020 09:00

## 2020-12-06 ENCOUNTER — Ambulatory Visit
Admission: RE | Admit: 2020-12-06 | Discharge: 2020-12-06 | Disposition: A | Discharge status: Home / Self Care | Payer: Medicare Other | Attending: Hematology and Oncology | Admitting: Hematology and Oncology

## 2020-12-06 ENCOUNTER — Ambulatory Visit
Admission: RE | Admit: 2020-12-06 | Discharge: 2020-12-06 | Disposition: A | Discharge status: Home / Self Care | Payer: Medicare Other | Source: Ambulatory Visit | Attending: Hematology and Oncology | Admitting: Hematology and Oncology

## 2020-12-06 ENCOUNTER — Telehealth: Payer: Self-pay

## 2020-12-06 ENCOUNTER — Other Ambulatory Visit: Payer: Self-pay

## 2020-12-06 DIAGNOSIS — M899 Disorder of bone, unspecified: Secondary | ICD-10-CM | POA: Diagnosis not present

## 2020-12-06 NOTE — Progress Notes (Signed)
JERVIS, TRAPANI ([id number]) Visit Report for 12/05/2020 HPI Details Patient Name: [name], [name] Date of Service: 12/05/2020 9:15 AM Medical Record Number: [id number] Patient Account Number: [id number] Date of Birth/Sex: [birth year] (87 y.o. M) Treating RN: Cornell Barman Primary Care Provider: Lesia Hausen Other Clinician: Referring Provider: Lesia Hausen Treating Provider/Extender: Tito Dine in Treatment: 1 History of Present Illness HPI Description: 2510/15/2020 on evaluation today patient presents for initial evaluation here in our clinic concerning issues that he has been having with lymphedema for quite some time. He has intermittent issues with blistering of his lower extremities for which she will go to the wound center and typically they will wrap him and then subsequently this gets better. That is been an ongoing issue for quite some time. Currently a couple weeks ago he had blisters pop up on the left leg and the right leg currently there is a small spot open on the right but nothing on the left. He does have a history of coronary artery disease, COPD, diabetes mellitus type 2, and hypertension. Fortunately there is no signs of any cellulitis or active infection at this time. He has never had any lymphedema pumps and in fact did not even know what I was talking about when I mention this to him although he has been wrapped and had compression recommended for him long enough he may qualify for going ahead and initiating therapy with lymphedema pumps obviously this would have to be insurance approved. Fortunately there is no signs of any systemic infection either. No fevers, chills, nausea, vomiting, or diarrhea. He does have stage III lymphedema. 08/12/2019 on evaluation today patient appears to be doing well with regard to his lower extremities the edema is much better than it was during the last evaluation. Fortunately there is no signs of active infection which is great news.  No fevers, chills, nausea, vomiting, or diarrhea. Overall he has been tolerating the wraps without any complication and the wound seems to be doing better as well. We are still working on gathering all the information for ordering the lymphedema pumps at this point. He does need a plan for ongoing compression as well. 10/30; the patient seems to be making nice progress. He only has a small open wound remaining on the right lateral calf. Everything on the left is healed. He has his bilateral external compression wraps. 08/30/2019 on evaluation today patient's right lower extremity actually appears to be doing quite well and in fact appears to be completely healed based on what I am seeing at this time. He shows no signs of active infection which is good news. He does have a blister over the left lower extremity which was previously healed out and he has a Velcro compression stocking on this area. With that being said he does not seem to be doing poorly at all at this point which is good news. All in all however I do think we want to keep an eye on him for another couple weeks before completely discharging him. 09/13/2019 on evaluation today patient appears to be doing well with regard to his right lower extremity although this is very swollen on the left lower extremity he actually has an opening where the blistered area we were observing last week has opened up into an actual wound. Unfortunately this just does not seem to be doing quite as well as we would have liked. I was really hoping that he would be doing just fine today and that we would be able  to discharge him as healed today. Fortunately there is no signs of active infection at this point. The area of opening is very small he has very small blisters noted distal to the actual wound opening on the left lower extremity. 09/19/2019 upon evaluation today patient appears to be doing well with regard to his bilateral lower extremities and in fact  he appears to be completely healed at this time which is great news. He was also contacted by the lymphedema pump company and he has been approved apparently for the pumps and is ready to proceed from there as well. They are very happy about this. READMISSION 06/06/2020 This is a patient that we had in clinic in the fall. He had I believe wounds on his bilateral lower extremities. We healed this up with compression. He had some form of compression stocking and there was suggestion that we referred him for lymphedema pumps. The patient is telling me that he had the lymphedema pumps but has not used them in 5 months. He now lives in an assisted living and may have been previously he was at home. 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 His daughter called urgently this morning stating that he had to be seen urgently because of blisters and edema in his lower extremities. I think she felt that that this was all lymphedema issues. We had a cancellation so he is here. The patient came in from the facility but his daughter is not here. From review of the records that accompanied admitted. He has TED hose but has not been using any more advanced stocking or the lymphedema pumps. He is on Lasix 20 mg a day. Past medical history; I have looked over his past medical history. He has advanced prostate cancer with some degree of chronic renal insufficiency although his recent creatinine was 1.21. He has an IVC filter placed in January of this year secondary to inability to anticoagulate him because of recurrent hematuria and a history of DVT. He also has a history of diastolic congestive heart failure, COPD type 2 diabetes. ABIs in our clinic were 1.16 on the left and 1.11 on the 8/25; the patient with severe bilateral lymphedema. He also has a history of DVT and apparently has an IVC filter. He came in the clinic last week with severe  bilateral lower extremity edema. So much so that I elected to put him in bilateral 3 layer compression. I was worried that things were in a state of imminent breakdown. He has compression pumps but is now in an assisted living he is not using these. I am not sure if he is keeping his legs elevated. When he came in here he was not using compression stockings 9/1; this was a patient with severe bilateral lymphedema. He also has a history of a DVT and an IVC filter. He came in with severe bilateral edema nonpitting. We put him in 3 layer compression even though he only had 3 blistered areas on his bilateral anterior tibia. As it turned out he has external compression pumps which she is now using twice a day at the assisted living where he resides. His daughter reports they also have stockings. LOEL, BETANCUR ([id number]) READMISSION 11/28/2020 This is a patient we had in the clinic in the recent past. He has lower extremity edema probably mostly lymphedema although he does have a history of DVT and has an IVC filter. He comes in  with an increase in swelling in his lower legs he has bilateral lower extremity blisters which are preulcerative. They usually respond to 4-layer compression and we discharged him in stockings and use of his compression pumps. He is in [facility] assisted living. He states that he is very compliant with the stockings and he has 4 pair. He says he is using the compression pump once a day. I do not have a good sense of when his lower extremity swelling started but he does have the usual presentation of tense blisters on the anterior lower legs. 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 We did not repeat his arterial ABIs but they have always been normal. 2/16; predictably all the blisters in areas that were concerning last week on the right and left lower extremities are closed over. We had him in 4 layer compression and he is using his external compression pumps once a day is what I understand Electronic Signature(s) Signed: 12/06/2020 12:09:37 PM By: Linton Ham MD Entered By: Linton Ham on 12/05/2020 11:27:47 HAKAN, NUDELMAN ([id number]) -------------------------------------------------------------------------------- Physical Exam Details Patient Name: [name] Date of Service: 12/05/2020 9:15 AM Medical Record Number: [id number] Patient Account Number: [id number] Date of Birth/Sex: 09-Aug-1932 (87 y.o. M) Treating RN: Cornell Barman Primary Care Provider: Lesia Hausen Other Clinician: Referring Provider: Lesia Hausen Treating Provider/Extender: Tito Dine in Treatment: 1 Constitutional Patient is hypertensive.. Pulse regular and within target range for patient.Marland Kitchen Respirations regular, non-labored and within target range.. Temperature is normal and within the target range for the patient.Marland Kitchen appears in no distress. Notes Wound exam; the patient has no open area all the blisters are closed over he has much better edema control and 4-layer compression no evidence of infection no evidence of a DVT Electronic Signature(s) Signed: 12/06/2020 12:09:37 PM By: Linton Ham MD Entered By: Linton Ham on 12/05/2020 11:29:41 [name] ([id number]) -------------------------------------------------------------------------------- Physician Orders Details Patient Name: [name] Date of Service: 12/05/2020 9:15 AM Medical Record Number: [id number] Patient Account Number: [id number] Date of Birth/Sex: 03-05-1932 (87 y.o. M) Treating RN: Cornell Barman Primary Care Provider: Lesia Hausen Other Clinician: Referring Provider: Lesia Hausen Treating Provider/Extender: Tito Dine in Treatment: 1 Verbal / Phone Orders: No Diagnosis Coding Discharge From [facility] Services o Discharge from [facility] Complete o Wear compression garments daily. Put garments on first thing when you wake up and remove them before bed. o Moisturize legs daily after removing compression garments. Follow-up Appointments o Other: - As needed. Edema Control - Lymphedema / Segmental Compressive Device / Other Bilateral Lower Extremities o Patient to wear own compression stockings. Remove compression stockings every night before going to bed and put on every morning when getting up. o Compression Pump: Use compression pump on left lower extremity for 60 minutes, twice daily. o Compression Pump: Use compression pump on right lower extremity for 60 minutes, twice daily. Electronic Signature(s) Signed: 12/05/2020 9:26:32 PM By: Gretta Cool, BSN, RN, CWS, Kim RN, BSN Signed: 12/06/2020 12:09:37 PM By: Linton Ham MD Entered By: Gretta Cool, BSN, RN, CWS, Kim on 12/05/2020 10:08:10 OLLEN, RAO ([id number]) -------------------------------------------------------------------------------- Problem List Details Patient Name: [name] Date of Service: 12/05/2020 9:15 AM Medical Record Number:  [id number] Patient Account Number: [id number] Date of Birth/Sex: 02/01/1932 (87 y.o. M) Treating RN: Cornell Barman Primary Care Provider: Lesia Hausen Other Clinician: Referring Provider: Lesia Hausen Treating Provider/Extender: Tito Dine in Treatment: 1 Active Problems ICD-10 Encounter Code Description Active Date MDM Diagnosis I89.0 Lymphedema, not elsewhere classified 11/28/2020 No Yes L97.828 Non-pressure chronic ulcer of other part of left lower leg with other 11/28/2020 No Yes specified severity L97.818 Non-pressure chronic ulcer of other part of right lower leg with other 11/28/2020 No Yes specified  severity Inactive Problems Resolved Problems Electronic Signature(s) Signed: 12/06/2020 12:09:37 PM By: Linton Ham MD Entered By: Linton Ham on 12/05/2020 11:27:09 [name] ([id number]) -------------------------------------------------------------------------------- Progress Note Details Patient Name: [name] Date of Service: 12/05/2020 9:15 AM Medical Record Number: [id number] Patient Account Number: [id number] Date of Birth/Sex: 1932-03-13 (87 y.o. M) Treating RN: Cornell Barman Primary Care Provider: Lesia Hausen Other Clinician: Referring Provider: Lesia Hausen Treating Provider/Extender: Tito Dine in Treatment: 1 Subjective History of Present Illness (HPI) 2510/15/2020 on evaluation today patient presents for initial evaluation here in our clinic concerning issues that he has been having with lymphedema for quite some time. He has intermittent issues with blistering of his lower extremities for which she will go to the wound center and typically they will wrap him and then subsequently this gets better. That is been an ongoing issue for quite some time. Currently a couple weeks ago he had blisters pop up on the left leg and the right leg currently there is a small spot open on the right but nothing on the left. He does have a history of coronary artery disease, COPD, diabetes mellitus type 2, and hypertension. Fortunately there is no signs of any cellulitis or active infection at this time. He has never had any lymphedema pumps and in fact did not even know what I was talking about when I mention this to him although he has been wrapped and had compression recommended for him long enough he may qualify for going ahead and initiating therapy with lymphedema pumps obviously this would have to be insurance approved. Fortunately there is no signs of any systemic infection either. No fevers, chills, nausea, vomiting, or diarrhea. He does have stage III  lymphedema. 08/12/2019 on evaluation today patient appears to be doing well with regard to his lower extremities the edema is much better than it was during the last evaluation. Fortunately there is no signs of active infection which is great news. No fevers, chills, nausea, vomiting, or diarrhea. Overall he has been tolerating the wraps without any complication and the wound seems to be doing better as well. We are still working on gathering all the information for ordering the lymphedema pumps at this point. He does need a plan for ongoing compression as well. 10/30; the patient seems to be making nice progress. He only has a small open wound remaining on the right lateral calf. Everything on the left is healed. He has his bilateral external compression wraps. 08/30/2019 on evaluation today patient's right lower extremity actually appears to be doing quite well and in fact appears to be completely healed based on what I am seeing at this time. He shows no signs of active infection which is good news. He does have a blister over the left lower extremity which was previously healed out and he has a Velcro compression stocking on this area. With that being said he does not seem to be doing poorly at all at this point  which is good news. All in all however I do think we want to keep an eye on him for another couple weeks before completely discharging him. 09/13/2019 on evaluation today patient appears to be doing well with regard to his right lower extremity although this is very swollen on the left lower extremity he actually has an opening where the blistered area we were observing last week has opened up into an actual wound. Unfortunately this just does not seem to be doing quite as well as we would have liked. I was really hoping that he would be doing just fine today and that we would be able to discharge him as healed today. Fortunately there is no signs of active infection at this point. The  area of opening is very small he has very small blisters noted distal to the actual wound opening on the left lower extremity. 09/19/2019 upon evaluation today patient appears to be doing well with regard to his bilateral lower extremities and in fact he appears to be completely healed at this time which is great news. He was also contacted by the lymphedema pump company and he has been approved apparently for the pumps and is ready to proceed from there as well. They are very happy about this. READMISSION 06/06/2020 This is a patient that we had in clinic in the fall. He had I believe wounds on his bilateral lower extremities. We healed this up with compression. He had some form of compression stocking and there was suggestion that we referred him for lymphedema pumps. The patient is telling me that he had the lymphedema pumps but has not used them in 5 months. He now lives in an assisted living and may have been previously he was at home. 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 His daughter called urgently this morning stating that he had to be seen urgently because of blisters and edema in his lower extremities. I think she felt that that this was all lymphedema issues. We had a cancellation so he is here. The patient came in from the facility but his daughter is not here. From review of the records that accompanied admitted. He has TED hose but has not been using any more advanced stocking or the lymphedema pumps. He is on Lasix 20 mg a day. Past medical history; I have looked over his past medical history. He has advanced prostate cancer with some degree of chronic renal insufficiency although his recent creatinine was 1.21. He has an IVC filter placed in January of this year secondary to inability to anticoagulate him because of recurrent hematuria and a history of DVT. He also has a history of diastolic congestive heart  failure, COPD type 2 diabetes. ABIs in our clinic were 1.16 on the left and 1.11 on the 8/25; the patient with severe bilateral lymphedema. He also has a history of DVT and apparently has an IVC filter. He came in the clinic last week with severe bilateral lower extremity edema. So much so that I elected to put him in bilateral 3 layer compression. I was worried that things were in a state of imminent breakdown. He has compression pumps but is now in an assisted living he is not using these. I am not sure if he is keeping his legs elevated. When he came in here he was not using compression stockings 9/1; this was a patient with severe bilateral lymphedema. He also has  a history of a DVT and an IVC filter. He came in with severe bilateral edema nonpitting. We put him in 3 layer compression even though he only had 3 blistered areas on his bilateral anterior tibia. As it turned out he has external compression pumps which she is now using twice a day at the assisted living where he resides. His daughter reports they also have stockings. READMISSION 11/28/2020 This is a patient we had in the clinic in the recent past. He has lower extremity edema probably mostly lymphedema although he does have a Veselka, Rutherford ([id number]) history of DVT and has an IVC filter. He comes in with an increase in swelling in his lower legs he has bilateral lower extremity blisters which are preulcerative. They usually respond to 4-layer compression and we discharged him in stockings and use of his compression pumps. He is in [facility] assisted living. He states that he is very compliant with the stockings and he has 4 pair. He says he is using the compression pump once a day. I do not have a good sense of when his lower extremity swelling started but he does have the usual presentation of tense blisters on the anterior lower legs. 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 We did not repeat his arterial ABIs but they have always been normal. 2/16; predictably all the blisters in areas that were concerning last week on the right and left lower extremities are closed over. We had him in 4 layer compression and he is using his external compression pumps once a day is what I understand Objective Constitutional Patient is hypertensive.. Pulse regular and within target range for patient.Marland Kitchen Respirations regular, non-labored and within target range.. Temperature is normal and within the target range for the patient.Marland Kitchen appears in no distress. Vitals Time Taken: 9:37 AM, Temperature: 97.9 F, Pulse: 62 bpm, Respiratory Rate: 18 breaths/min, Blood Pressure: 151/70 mmHg. 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 Put garments on first thing when you wake up and remove them before bed. Moisturize legs daily after removing compression garments. Follow-up Appointments: Other: - As needed. Edema Control - Lymphedema / Segmental Compressive Device / Other: Patient to wear own compression stockings. Remove compression stockings every night before going to bed and put on every morning when getting up. Compression  Pump: Use compression pump on left lower extremity for 60 minutes, twice daily. Compression Pump: Use compression pump on right lower extremity for 60 minutes, twice daily. 1. With the compression we put him in last week everything that was threatened blisters is totally resolved. He has much better edema control bilaterally 2. He has compression stockings but he did not bring these in today. He is going to need to apply these as  soon as he gets back to the assisted living where he lives [facility] [place], [place] ([id number]) #3 I have asked him to wear his compression stockings and I think he has been reasonably compliant with this although I am not certain. I have also asked him to increase the compression pump use to twice a day. I think he is reliant on the facility for help with this we wrote an order to this effect. 4. Moisturizer to his legs nightly Electronic Signature(s) Signed: 12/06/2020 12:09:37 PM By: Linton Ham MD Entered By: Linton Ham on 12/05/2020 11:31:40 [name] ([id number]) -------------------------------------------------------------------------------- SuperBill Details Patient Name: [name] Date of Service: 12/05/2020 Medical Record Number: [id number] Patient Account Number: [id number] Date of Birth/Sex: 09-Jul-1932 (87 y.o. M) Treating RN: Cornell Barman Primary Care Provider: Lesia Hausen Other Clinician: Referring Provider: Lesia Hausen Treating Provider/Extender: Tito Dine in Treatment: 1 Diagnosis Coding ICD-10 Codes Code Description I89.0 Lymphedema, not elsewhere classified L97.828 Non-pressure chronic ulcer of other part of left lower leg with other specified severity L97.818 Non-pressure chronic ulcer of other part of right lower leg with other specified severity Facility Procedures CPT4 Code: [id number] Description: [phone] - WOUND CARE VISIT-LEV 2 EST PT Modifier: Quantity: 1 Physician Procedures CPT4 Code:  [id number] Description: 00938 - WC PHYS LEVEL 3 - EST PT Modifier: Quantity: 1 CPT4 Code: Description: ICD-10 Diagnosis Description I89.0 Lymphedema, not elsewhere classified L97.828 Non-pressure chronic ulcer of other part of left lower leg with other speci L97.818 Non-pressure chronic ulcer of other part of right lower leg with other spec Modifier: fied severity ified severity Quantity: Electronic Signature(s) Signed: 12/06/2020 12:09:37 PM By: Linton Ham MD Entered By: Linton Ham on 12/05/2020 11:32:30

## 2020-12-06 NOTE — Progress Notes (Signed)
DAYMON, HORA ([id number]) Visit Report for 12/05/2020 Arrival Information Details Patient Name: [name], [name] Date of Service: 12/05/2020 9:15 AM Medical Record Number: [id number] Patient Account Number: [id number] Date of Birth/Sex: 02/07/1932 (87 y.o. M) Treating RN: Dolan Amen Primary Care Tuere Nwosu: Lesia Hausen Other Clinician: Referring Mikinzie Maciejewski: Lesia Hausen Treating Levita Monical/Extender: Tito Dine in Treatment: 1 Visit Information History Since Last Visit Has Compression in Place as Prescribed: Yes Patient Arrived: Wheel Chair Pain Present Now: No Arrival Time: 09:37 Accompanied By: self Transfer Assistance: Manual Patient Identification Verified: Yes Secondary Verification Process Completed: Yes Patient Has Alerts: Yes Patient Alerts: Type II Diabetes 06/06/2020 ABI L) 1.13 R) 1.11 Electronic Signature(s) Signed: 12/06/2020 12:42:05 PM By: Georges Mouse, Minus Breeding RN Entered By: Georges Mouse, Minus Breeding on 12/05/2020 09:37:40 [name] ([id number]) -------------------------------------------------------------------------------- Clinic Level of Care Assessment Details Patient Name: [name] Date of Service: 12/05/2020 9:15 AM Medical Record Number: [id number] Patient Account Number: [id number] Date of Birth/Sex: 04-12-1932 (87 y.o. M) Treating RN: Cornell Barman Primary Care Kayston Jodoin: Lesia Hausen Other Clinician: Referring Masha Orbach: Lesia Hausen Treating Nephtali Docken/Extender: Tito Dine in Treatment: 1 Clinic Level of Care Assessment Items TOOL 4 Quantity Score []  - Use when only an EandM is performed on FOLLOW-UP visit 0 ASSESSMENTS - Nursing Assessment / Reassessment X - Reassessment of Co-morbidities (includes updates in patient status) 1 10 X- 1 5 Reassessment of Adherence to Treatment Plan ASSESSMENTS - Wound and Skin Assessment / Reassessment []  - Simple Wound Assessment / Reassessment - one wound 0 []  - 0 Complex Wound  Assessment / Reassessment - multiple wounds []  - 0 Dermatologic / Skin Assessment (not related to wound area) ASSESSMENTS - Focused Assessment X - Circumferential Edema Measurements - multi extremities 1 5 []  - 0 Nutritional Assessment / Counseling / Intervention []  - 0 Lower Extremity Assessment (monofilament, tuning fork, pulses) []  - 0 Peripheral Arterial Disease Assessment (using hand held doppler) ASSESSMENTS - Ostomy and/or Continence Assessment and Care []  - Incontinence Assessment and Management 0 []  - 0 Ostomy Care Assessment and Management (repouching, etc.) PROCESS - Coordination of Care X - Simple Patient / Family Education for ongoing care 1 15 []  - 0 Complex (extensive) Patient / Family Education for ongoing care X- 1 10 Staff obtains Programmer, systems, Records, Test Results / Process Orders []  - 0 Staff telephones HHA, Nursing Homes / Clarify orders / etc []  - 0 Routine Transfer to another Facility (non-emergent condition) []  - 0 Routine Hospital Admission (non-emergent condition) []  - 0 New Admissions / Biomedical engineer / Ordering NPWT, Apligraf, etc. []  - 0 Emergency Hospital Admission (emergent condition) X- 1 10 Simple Discharge Coordination []  - 0 Complex (extensive) Discharge Coordination PROCESS - Special Needs []  - Pediatric / Minor Patient Management 0 []  - 0 Isolation Patient Management []  - 0 Hearing / Language / Visual special needs []  - 0 Assessment of Community assistance (transportation, D/C planning, etc.) []  - 0 Additional assistance / Altered mentation []  - 0 Support Surface(s) Assessment (bed, cushion, seat, etc.) INTERVENTIONS - Wound Cleansing / Measurement Puleo, [place] ([id number]) []  - 0 Simple Wound Cleansing - one wound []  - 0 Complex Wound Cleansing - multiple wounds []  - 0 Wound Imaging (photographs - any number of wounds) []  - 0 Wound Tracing (instead of photographs) []  - 0 Simple Wound Measurement - one wound []   - 0 Complex Wound Measurement - multiple wounds INTERVENTIONS - Wound Dressings []  - Small Wound Dressing one or multiple wounds 0 []  - 0 Medium Wound  Dressing one or multiple wounds []  - 0 Large Wound Dressing one or multiple wounds []  - 0 Application of Medications - topical []  - 0 Application of Medications - injection INTERVENTIONS - Miscellaneous []  - External ear exam 0 []  - 0 Specimen Collection (cultures, biopsies, blood, body fluids, etc.) []  - 0 Specimen(s) / Culture(s) sent or taken to Lab for analysis []  - 0 Patient Transfer (multiple staff / Civil Service fast streamer / Similar devices) []  - 0 Simple Staple / Suture removal (25 or less) []  - 0 Complex Staple / Suture removal (26 or more) []  - 0 Hypo / Hyperglycemic Management (close monitor of Blood Glucose) []  - 0 Ankle / Brachial Index (ABI) - do not check if billed separately X- 1 5 Vital Signs Has the patient been seen at the hospital within the last three years: Yes Total Score: 60 Level Of Care: New/Established - Level 2 Electronic Signature(s) Signed: 12/05/2020 9:26:32 PM By: Gretta Cool, BSN, RN, CWS, Kim RN, BSN Entered By: Gretta Cool, BSN, RN, CWS, Kim on 12/05/2020 10:09:24 [name] ([id number]) -------------------------------------------------------------------------------- Encounter Discharge Information Details Patient Name: [name] Date of Service: 12/05/2020 9:15 AM Medical Record Number: [id number] Patient Account Number: [id number] Date of Birth/Sex: [birth year] (87 y.o. M) Treating RN: Dolan Amen Primary Care Marlo Goodrich: Lesia Hausen Other Clinician: Referring Desirae Mancusi: Lesia Hausen Treating Mateja Dier/Extender: Tito Dine in Treatment: 1 Encounter Discharge Information Items Discharge Condition: Stable Ambulatory Status: Wheelchair Discharge Destination: Other (Note Required) Telephoned: No Orders Sent: Yes Transportation: Private Auto Accompanied By: self Schedule Follow-up  Appointment: No Clinical Summary of Care: Notes assisted living facility Electronic Signature(s) Signed: 12/06/2020 12:42:05 PM By: Georges Mouse, Minus Breeding RN Entered By: Georges Mouse, Minus Breeding on 12/05/2020 10:45:24 [name] ([id number]) -------------------------------------------------------------------------------- Lower Extremity Assessment Details Patient Name: [name] Date of Service: 12/05/2020 9:15 AM Medical Record Number: [id number] Patient Account Number: [id number] Date of Birth/Sex: Dec 12, 1931 (88 y.o. M) Treating RN: Dolan Amen Primary Care Davidjames Blansett: Lesia Hausen Other Clinician: Referring Matis Monnier: Lesia Hausen Treating Erynn Vaca/Extender: Tito Dine in Treatment: 1 Edema Assessment Assessed: Shirlyn Goltz: Yes] [Right: Yes] Edema: [Left: Yes] [Right: Yes] Calf Left: Right: Point of Measurement: 30 cm From Medial Instep 34.5 cm 35 cm Ankle Left: Right: Point of Measurement: 10 cm From Medial Instep 28.5 cm 31.5 cm Vascular Assessment Pulses: Dorsalis Pedis Palpable: [Left:Yes] [Right:Yes] Electronic Signature(s) Signed: 12/06/2020 12:42:05 PM By: Georges Mouse, Minus Breeding RN Entered By: Georges Mouse, Minus Breeding on 12/05/2020 09:48:06 Penson, Jodi Mourning ([id number]) -------------------------------------------------------------------------------- Multi Wound Chart Details Patient Name: [name] Date of Service: 12/05/2020 9:15 AM Medical Record Number: [id number] Patient Account Number: [id number] Date of Birth/Sex: Aug 04, 1932 (88 y.o. M) Treating RN: Cornell Barman Primary Care Jennifr Gaeta: Lesia Hausen Other Clinician: Referring Mohamud Mrozek: Lesia Hausen Treating Taylin Mans/Extender: Tito Dine in Treatment: 1 Vital Signs Height(in): Pulse(bpm): 62 Weight(lbs): Blood Pressure(mmHg): 151/70 Body Mass Index(BMI): Temperature(F): 97.9 Respiratory Rate(breaths/min): 18 Wound Assessments Treatment Notes Electronic Signature(s) Signed:  12/05/2020 9:26:32 PM By: Gretta Cool, BSN, RN, CWS, Kim RN, BSN Entered By: Gretta Cool, BSN, RN, CWS, Kim on 12/05/2020 10:06:57 [name] ([id number]) -------------------------------------------------------------------------------- Multi-Disciplinary Care Plan Details Patient Name: [name] Date of Service: 12/05/2020 9:15 AM Medical Record Number: [id number] Patient Account Number: [id number] Date of Birth/Sex: 04-17-1932 (88 y.o. M) Treating RN: Cornell Barman Primary Care Suhayla Chisom: Lesia Hausen Other Clinician: Referring Sam Overbeck: Lesia Hausen Treating Samiksha Pellicano/Extender: Tito Dine in Treatment: 1 Active Inactive Electronic Signature(s) Signed: 12/05/2020 9:26:32 PM By: Gretta Cool, BSN, RN, CWS, Kim RN, BSN Entered By: Gretta Cool, BSN,  RN, CWS, Kim on 12/05/2020 10:06:50 LEWELLYN, FULTZ ([id number]) -------------------------------------------------------------------------------- Non-Wound Condition Assessment Details Patient Name: WILLOUGHBY, DOELL Date of Service: 12/05/2020 9:15 AM Medical Record Number: [id number] Patient Account Number: [id number] Date of Birth/Sex: [birth year] (88 y.o. M) Treating RN: Dolan Amen Primary Care De Libman: Lesia Hausen Other Clinician: Referring Floretta Petro: Lesia Hausen Treating Nell Gales/Extender: Tito Dine in Treatment: 1 Non-Wound Condition: Condition: Lymphedema Location: Leg Side: Bilateral Notes: significant swelling in both legs today with reported increase in pain. Photos Notes decreased swelling, no pain Electronic Signature(s) Signed: 12/06/2020 12:42:05 PM By: Georges Mouse, Minus Breeding RN Entered By: Georges Mouse, Minus Breeding on 12/05/2020 09:49:41 DEANGLEO, PASSAGE ([id number]) -------------------------------------------------------------------------------- Pain Assessment Details Patient Name: [name] Date of Service: 12/05/2020 9:15 AM Medical Record Number: [id number] Patient Account Number: [id number] Date of  Birth/Sex: [birth year] (88 y.o. M) Treating RN: Dolan Amen Primary Care Vincentina Sollers: Lesia Hausen Other Clinician: Referring Sherice Ijames: Lesia Hausen Treating Makhi Muzquiz/Extender: Tito Dine in Treatment: 1 Active Problems Location of Pain Severity and Description of Pain Patient Has Paino No Site Locations Rate the pain. Current Pain Level: 0 Pain Management and Medication Current Pain Management: Electronic Signature(s) Signed: 12/06/2020 12:42:05 PM By: Georges Mouse, Minus Breeding RN Entered By: Georges Mouse, Minus Breeding on 12/05/2020 09:39:12 SCOUT, GUMBS ([id number]) -------------------------------------------------------------------------------- Patient/Caregiver Education Details Patient Name: [name] Date of Service: 12/05/2020 9:15 AM Medical Record Number: [id number] Patient Account Number: [id number] Date of Birth/Gender: April 22, 1932 (88 y.o. M) Treating RN: Cornell Barman Primary Care Physician: Lesia Hausen Other Clinician: Referring Physician: Lesia Hausen Treating Physician/Extender: Tito Dine in Treatment: 1 Education Assessment Education Provided To: Patient Education Topics Provided Notes Pump for one hour twice daily. Wear compressions stockings daily. Electronic Signature(s) Signed: 12/05/2020 9:26:32 PM By: Gretta Cool, BSN, RN, CWS, Kim RN, BSN Entered By: Gretta Cool, BSN, RN, CWS, Kim on 12/05/2020 10:10:16 RINO, HOSEA ([id number]) -------------------------------------------------------------------------------- Vitals Details Patient Name: [name] Date of Service: 12/05/2020 9:15 AM Medical Record Number: [id number] Patient Account Number: [id number] Date of Birth/Sex: 1932/08/12 (88 y.o. M) Treating RN: Dolan Amen Primary Care Gianno Volner: Lesia Hausen Other Clinician: Referring Karolyne Timmons: Lesia Hausen Treating Vence Lalor/Extender: Tito Dine in Treatment: 1 Vital Signs Time Taken: 09:37 Temperature (F): 97.9 Pulse  (bpm): 62 Respiratory Rate (breaths/min): 18 Blood Pressure (mmHg): 151/70 Reference Range: 80 - 120 mg / dl Electronic Signature(s) Signed: 12/06/2020 12:42:05 PM By: Georges Mouse, Minus Breeding RN Entered By: Georges Mouse, Minus Breeding on 12/05/2020 09:39:05

## 2020-12-12 ENCOUNTER — Inpatient Hospital Stay: Payer: Medicare Other

## 2020-12-12 ENCOUNTER — Inpatient Hospital Stay: Payer: Medicare Other | Admitting: Hematology and Oncology

## 2020-12-12 ENCOUNTER — Ambulatory Visit: Payer: Medicare Other | Admitting: Internal Medicine

## 2020-12-12 ENCOUNTER — Other Ambulatory Visit: Payer: Self-pay | Admitting: Hematology and Oncology

## 2020-12-13 ENCOUNTER — Other Ambulatory Visit: Payer: Self-pay

## 2020-12-19 ENCOUNTER — Ambulatory Visit: Payer: Medicare Other | Admitting: Internal Medicine

## 2020-12-19 NOTE — Progress Notes (Signed)
[facility]  [address] [place], [place] [address] Phone: [phone]  Fax: [phone]   Clinic Day:  12/20/2020  Referring physician: Lesia Hausen, PA  Chief Complaint: Brandon Busing Sr. is a 87 y.o. male with metastatic prostate cancer on abiraterone and stage III chronic kidney disease who is seen for 1 month assessment, review of imaging, and monthly Degarelix.  HPI: The patient was last seen in the medical oncology clinic on 11/14/2020. At that time, he felt alright. He recently had 3 falls. He denied bone pain. He required assistance with ADLs. Exam was stable. Hematocrit was 30.4, hemoglobin 9.9, MCV 88.1, platelets 171,000, WBC 5,900. Creatinine was 1.24 (CrCl 57 ml/min). Potassium was 3.4. Total protein was 6.2. Albumin was 3.1. PSA was 467.00. Testosterone was <3. He received Degarelix.  Chest, abdomen, and pelvis CT without contrast on 12/04/2020 revealed interval increase in size of an enlarged left iliac node, as well as newly enlarged perirectal and sigmoid mesocolon lymph nodes and soft tissue nodules. Although in a somewhat unusual distribution these were in keeping with previously biopsy proven metastatic prostate malignancy. There were no significant change in additional enlarged retroperitoneal lymph nodes. Findings were c/w worsened metastatic disease. There was redemonstrated diffuse sclerotic osseous metastatic disease, not significantly changed compared to prior examination and again notable for wedge deformity of L1, approximately 50% height loss. There was a new superior endplate deformity of L3, in addition to an existing inferior endplate Schmorl deformity. There was prostatomegaly. There was coronary artery disease and aortic atherosclerosis.  Bone scan on 12/04/2020 revealed osseous metastases within ribs, spine, pelvis, sternum, and RIGHT humeral diaphysis. When compared to the previous exam, new lesions were seen within the ribs  and RIGHT humeral diaphysis as well as progressive uptake in the RIGHT acromion and RIGHT acetabulum.  Plain films of the right humerus on 12/06/2020 revealed subtle areas of sclerosis at the mid RIGHT humeral diaphysis, 1 of which may represent a subtle sclerotic metastasis to correspond to the bone scan finding. There were no sites of bone destruction, cortical thinning, or impending fracture.  During the interim, he has been okay. He is very weak this morning. His family member needed help getting him into the car this morning, which is very unusual. His blood sugar was in the 70s this morning and he was given orange juice. It has been low for 2-3 weeks. He is not taking any new medications. He slept well last night.  The patient had a UTI in January. He went to the [facility] ER on 11/12/2020 for hematuria. He received IV Rocephin and was discharged on Keflex BID x 7 days. Per his daughter, he just finished these antibiotics.  He has chills every once in a while. He has a chronic cough. He always has loose stools. He denies mucous or blood in the stool. He vomited when he had to get his bone scan.  The patient agrees to a biopsy.   Past Medical History:  Diagnosis Date  . Anemia, normocytic normochromic   . BPH (benign prostatic hyperplasia)   . CAD (coronary artery disease)   . Cataracts, bilateral   . Chronic renal insufficiency   . Colon polyp   . COPD (chronic obstructive pulmonary disease) ([place])   . Diabetes mellitus without complication ([place])   . DVT (deep venous thrombosis) ([place])   . Edema of both legs   . Glaucoma   . Gout   . Heart murmur   . Hepatitis  C antibody test positive 10/20/2019  . Hyperlipidemia   . Hypertension   . Moderate persistent asthma without complication   . Myocardial infarction ([place])   . Neuromuscular disorder ([place])   . Neuropathy   . Peripheral vascular disease ([place])   . Premature ejaculation   . Prostate cancer ([place])   . RLS (restless legs syndrome)    . Sleep apnea   . Stented coronary artery 11/13/2015  . Uncontrolled type 2 diabetes mellitus with stage 3 chronic kidney disease, with long-term current use of insulin ([place])     Past Surgical History:  Procedure Laterality Date  . APPENDECTOMY    . [place]  2009  . COLONOSCOPY  2012  . EYE SURGERY    . IVC FILTER INSERTION N/A 11/07/2019   Procedure: IVC FILTER INSERTION;  Surgeon: Algernon Huxley, MD;  Location: [facility];  Service: Cardiovascular;  Laterality: N/A;  . PROSTATE SURGERY    . TONSILLECTOMY      History reviewed. No pertinent family history.  Social History:  reports that he has quit smoking. He has never used smokeless tobacco. No history on file for alcohol use and drug use. He previously smoked <1 pack/day x 20 years (stopped smoking with cardiac stent placement). He previously drank liquor on the weekend (stopped 2020). He is retired Dance movement psychotherapist). He previously lived outside of [place]. He then moved to [place] then [place] to live with his daughter [name] [phone]). His wife died of cancer 1 year ago.He is currently living at [facility] assisted living. The patient is accompanied by a family member today.  Allergies:  Allergies  Allergen Reactions  . Metformin And Related Diarrhea    Intolerance due to naturally loose bowels    Current Medications: Current Outpatient Medications  Medication Sig Dispense Refill  . abiraterone acetate (ZYTIGA) 250 MG tablet TAKE 4 TABLETS BY MOUTH DAILY ON AN EMPTY STOMACH 1 HOUR BEFORE OR 2 HOURS AFTER MEALS 120 tablet 1  . amLODipine (NORVASC) 10 MG tablet Take 10 mg by mouth daily.    Marland Kitchen atorvastatin (LIPITOR) 80 MG tablet Take 80 mg by mouth daily.    . carvedilol (COREG) 25 MG tablet Take 25 mg by mouth 2 (two) times daily with a meal.    . Cholecalciferol (VITAMIN D) 50 MCG (2000 UT) tablet Take 2,000 Units by mouth daily.     . DULoxetine (CYMBALTA) 60  MG capsule Take 60 mg by mouth daily.    . finasteride (PROSCAR) 5 MG tablet Take 5 mg by mouth daily.    . furosemide (LASIX) 20 MG tablet Take 20 mg by mouth.    . gabapentin (NEURONTIN) 100 MG capsule Take 1 capsule (100 mg total) by mouth 2 (two) times daily. (Patient taking differently: Take 100 mg by mouth at bedtime.) 60 capsule 0  . insulin aspart (NOVOLOG) 100 UNIT/ML injection Inject 5 Units into the skin 3 (three) times daily with meals. (Patient taking differently: Inject 12 Units into the skin 3 (three) times daily with meals. 12 units breakfast and lunch and then 6 units at dinner) 10 mL 11  . Insulin Detemir (LEVEMIR) 100 UNIT/ML Pen Inject 15 Units into the skin daily. (Patient taking differently: Inject 25 Units into the skin daily.) 20 mL 0  . melatonin 5 MG TABS Take 5 mg by mouth at bedtime.    . mirabegron ER (MYRBETRIQ) 25 MG TB24 tablet Take 25 mg by mouth daily.    Marland Kitchen  Multiple Vitamin (MULTI-VITAMIN) tablet Take 1 tablet by mouth daily.    . predniSONE (DELTASONE) 5 MG tablet TAKE 1 TABLET BY MOUTH DAILY WITH BREAKFAST. BEGIN TAKING WITH INITIATION OF ABIRATERONE. 30 tablet 0  . senna-docusate (SENOKOT-S) 8.6-50 MG tablet Take 1 tablet by mouth 2 (two) times daily. 10 tablet 0  . Skin Protectants, Misc. (EUCERIN) cream Apply 1 application topically daily.    . traZODone (DESYREL) 50 MG tablet Take 50 mg by mouth at bedtime.    Marland Kitchen UNIFINE SAFECONTROL PEN NEEDLE 30G X 5 MM MISC     . cephALEXin (KEFLEX) 500 MG capsule Take 1 capsule (500 mg total) by mouth 2 (two) times daily. (Patient not taking: Reported on 12/20/2020) 14 capsule 0  . famotidine (PEPCID) 20 MG tablet Take 20 mg by mouth at bedtime as needed for heartburn.     . nitroGLYCERIN (NITROSTAT) 0.4 MG SL tablet Place 0.4 mg under the tongue every 5 (five) minutes as needed for chest pain.  (Patient not taking: No sig reported)    . polyethylene glycol (MIRALAX / GLYCOLAX) 17 g packet Take 17 g by mouth daily. (Patient  not taking: Reported on 12/20/2020) 14 each 0   No current facility-administered medications for this visit.    Review of Systems  Constitutional: Positive for malaise/fatigue. Negative for chills, diaphoresis, fever and weight loss (up 1 lb since 08/2020).       Feels "weak."  HENT: Negative for congestion, ear discharge, ear pain, hearing loss, nosebleeds, sinus pain, sore throat and tinnitus.   Eyes: Negative for blurred vision and double vision.  Respiratory: Negative for cough, hemoptysis, sputum production and shortness of breath.   Cardiovascular: Positive for leg swelling (chronic). Negative for chest pain and palpitations.  Gastrointestinal: Negative for abdominal pain, blood in stool, constipation, diarrhea, heartburn, melena, nausea and vomiting.       Poor diet.  Genitourinary: Negative for dysuria, frequency, hematuria and urgency.  Musculoskeletal: Positive for joint pain (right hip s/p fall). Negative for back pain, myalgias and neck pain.  Skin: Negative for itching and rash.       Left forearm scraped s/p fall  Neurological: Positive for weakness. Negative for dizziness, tingling, sensory change and headaches.  Endo/Heme/Allergies: Does not bruise/bleed easily.       Diabetes; up and down  Psychiatric/Behavioral: Negative for depression and memory loss. The patient is not nervous/anxious and does not have insomnia.   All other systems reviewed and are negative.  Performance status (ECOG): 3  Vitals Blood pressure (!) 100/57, pulse 63, temperature (!) 97.4 F (36.3 C), resp. rate 16.   Physical Exam Vitals and nursing note reviewed.  Constitutional:      General: He is not in acute distress.    Appearance: He is not diaphoretic.     Comments: Patient examined in a wheelchair.  HENT:     Head: Normocephalic and atraumatic.     Mouth/Throat:     Mouth: Mucous membranes are moist.     Pharynx: Oropharynx is clear.  Eyes:     General: No scleral icterus.     Extraocular Movements: Extraocular movements intact.     Conjunctiva/sclera: Conjunctivae normal.     Pupils: Pupils are equal, round, and reactive to light.     Comments: Brown eyes.  Cardiovascular:     Rate and Rhythm: Normal rate and regular rhythm.     Heart sounds: Normal heart sounds. No murmur heard.   Pulmonary:  Effort: Pulmonary effort is normal. No respiratory distress.     Breath sounds: Normal breath sounds. No wheezing or rales.  Chest:     Chest wall: No tenderness.  Breasts:     Right: No axillary adenopathy or supraclavicular adenopathy.     Left: No axillary adenopathy or supraclavicular adenopathy.    Abdominal:     General: Bowel sounds are normal. There is no distension.     Palpations: Abdomen is soft. There is no mass.     Tenderness: There is no abdominal tenderness. There is no guarding or rebound.  Musculoskeletal:        General: No tenderness. Normal range of motion.     Cervical back: Normal range of motion and neck supple.     Right lower leg: Edema (chronic) present.     Left lower leg: Edema (chronic) present.  Lymphadenopathy:     Head:     Right side of head: No preauricular, posterior auricular or occipital adenopathy.     Left side of head: No preauricular, posterior auricular or occipital adenopathy.     Cervical: No cervical adenopathy.     Upper Body:     Right upper body: No supraclavicular or axillary adenopathy.     Left upper body: No supraclavicular or axillary adenopathy.     Lower Body: No right inguinal adenopathy. No left inguinal adenopathy.  Skin:    General: Skin is warm and dry.     Comments: Legs are wrapped.  Neurological:     Mental Status: He is alert and oriented to person, place, and time.     Comments: The patient knows that he is in [place], [place], [place]. States that it is "March, [birth year]". He knows who his daughter is and who the president is. He knows his name and date of birth. He is able to count backwards  from 100 by 3, though it takes a moment to think of the numbers.  Psychiatric:        Behavior: Behavior normal.        Thought Content: Thought content normal.        Judgment: Judgment normal.    Imaging studies: 11/05/2019:  Renal stone CTrevealed confluentnodular massesadjacent to the prostate and distal sigmoid colon with left pelvic, perirectal, retroperitoneal, and mesenteric adenopathy as well as probable peritoneal diseasemost marked in the right upper quadrant extending about the right diaphragmatic leaflet. There was pelvic and suspected vertebral osseous metastatic diseasewith L1 compression fracture, likely pathologic.There was a possible 2 cm right lower lobe lung nodule.There was left hydronephrosisand proximal ureterectasis secondary to pelvic process (prior renal ultrasound on 10/16/2019 revealed no hydronephrosis). 11/08/2019:  Chest CTrevealed right pleural effusion with pleural metastasis(2.7 x 2.1 cm nodularity along anterior right hemidiaphragm). Osseous metastasis, including a dominant lesioninvolving the right-side of the L2vertebral body.  11/08/2019:  Bone scanrevealed widespread bony metastasis(right occiput, clavicles, left upper extremity, sternum, ribs bilaterally, throughout the cervical, thoracic, lumbar spine, bony pelvis, proximal femurs, right tibia).  07/11/2020:  Bone scan revealed interval response to therapy with the resolution or varying degrees of diminishing uptake within widespread skeletal metastases noted on prior examination. Left hydronephrosis resolved. There was increased focal uptake within the medial ankles bilaterally, left greater than right, suggestive of interval trauma since prior examination. Correlation with plain film examination may be helpful for further evaluation. 07/18/2020:  Chest, abdomen and pelvis CT without contrast revealed significant interval reduction in previously seen bulky lymphadenopathy throughout the pelvis  and  retroperitoneum, c/w treatment response. There was interval increase in extensive sclerotic osseous metastatic disease throughout the included skeleton, likely reflecting post treatment change of existing osseous metastatic disease. Multiple subtle wedge and endplate deformities were unchanged, most notably a wedge deformity of L1 with approximately 50% height loss. There was prostatomegaly. There was coronary artery disease and aortic atherosclerosis. 12/04/2020:  Chest, abdomen, and pelvis CT revealed interval increase in size of an enlarged left iliac node, as well as newly enlarged perirectal and sigmoid mesocolon lymph nodes and soft tissue nodules. Although in a somewhat unusual distribution these were in keeping with previously biopsy proven metastatic prostate malignancy. There were no significant change in additional enlarged retroperitoneal lymph nodes. Findings were c/w worsened metastatic disease. There was redemonstrated diffuse sclerotic osseous metastatic disease, not significantly changed compared to prior examination and again notable for wedge deformity of L1, approximately 50% height loss. There was a new superior endplate deformity of L3, in addition to an existing inferior endplate Schmorl deformity. There was prostatomegaly. There was coronary artery disease and aortic atherosclerosis. 12/04/2020:  Bone scan revealed osseous metastases within ribs, spine, pelvis, sternum, and RIGHT humeral diaphysis. When compared to the previous exam, new lesions were seen within the ribs and RIGHT humeral diaphysis as well as progressive uptake in the RIGHT acromion and RIGHT acetabulum. 12/06/2020:  Plain films of the right humerus revealed subtle areas of sclerosis at the mid RIGHT humeral diaphysis, 1 of which may represent a subtle sclerotic metastasis to correspond to the bone scan finding. There were no sites of bone destruction, cortical thinning, or impending fracture were  identified.   Appointment on 12/20/2020  Component Date Value Ref Range Status  . Sodium 12/20/2020 135  135 - 145 mmol/L Final  . Potassium 12/20/2020 3.3* 3.5 - 5.1 mmol/L Final  . Chloride 12/20/2020 95* 98 - 111 mmol/L Final  . CO2 12/20/2020 28  22 - 32 mmol/L Final  . Glucose, Bld 12/20/2020 194* 70 - 99 mg/dL Final   Glucose reference range applies only to samples taken after fasting for at least 8 hours.  . BUN 12/20/2020 23  8 - 23 mg/dL Final  . Creatinine, Ser 12/20/2020 1.38* 0.61 - 1.24 mg/dL Final  . Calcium 12/20/2020 9.4  8.9 - 10.3 mg/dL Final  . Total Protein 12/20/2020 6.5  6.5 - 8.1 g/dL Final  . Albumin 12/20/2020 3.2* 3.5 - 5.0 g/dL Final  . AST 12/20/2020 22  15 - 41 U/L Final  . ALT 12/20/2020 18  0 - 44 U/L Final  . Alkaline Phosphatase 12/20/2020 97  38 - 126 U/L Final  . Total Bilirubin 12/20/2020 1.1  0.3 - 1.2 mg/dL Final  . GFR, Estimated 12/20/2020 50* >60 mL/min Final   Comment: (NOTE) Calculated using the CKD-EPI Creatinine Equation (2021)   . Anion gap 12/20/2020 12  5 - 15 Final   Performed at [facility], [address] [place], [place] [address]  . WBC 12/20/2020 7.9  4.0 - 10.5 K/uL Final  . RBC 12/20/2020 3.32* 4.22 - 5.81 MIL/uL Final  . Hemoglobin 12/20/2020 9.7* 13.0 - 17.0 g/dL Final  . HCT 12/20/2020 29.7* 39.0 - 52.0 % Final  . MCV 12/20/2020 89.5  80.0 - 100.0 fL Final  . MCH 12/20/2020 29.2  26.0 - 34.0 pg Final  . MCHC 12/20/2020 32.7  30.0 - 36.0 g/dL Final  . RDW 12/20/2020 13.8  11.5 - 15.5 % Final  . Platelets 12/20/2020 202  150 -  400 K/uL Final  . nRBC 12/20/2020 0.0  0.0 - 0.2 % Final  . Neutrophils Relative % 12/20/2020 78  % Final  . Neutro Abs 12/20/2020 6.3  1.7 - 7.7 K/uL Final  . Lymphocytes Relative 12/20/2020 11  % Final  . Lymphs Abs 12/20/2020 0.8  0.7 - 4.0 K/uL Final  . Monocytes Relative 12/20/2020 8  % Final  . Monocytes Absolute 12/20/2020 0.6  0.1 - 1.0 K/uL Final  . Eosinophils Relative  12/20/2020 2  % Final  . Eosinophils Absolute 12/20/2020 0.2  0.0 - 0.5 K/uL Final  . Basophils Relative 12/20/2020 0  % Final  . Basophils Absolute 12/20/2020 0.0  0.0 - 0.1 K/uL Final  . Immature Granulocytes 12/20/2020 1  % Final  . Abs Immature Granulocytes 12/20/2020 0.04  0.00 - 0.07 K/uL Final   Performed at [facility], [address] [place], [place] [address]    Assessment:  Kailo Kosik Sr. is a 87 y.o. male with metastatic prostate cancer. He presented with gross hematuria. PSAwas 2241 on 11/06/2019.   Renal stone CTon 11/05/2019 revealed confluentnodular massesadjacent to the prostate and distal sigmoid colon with left pelvic, perirectal, retroperitoneal, and mesenteric adenopathy as well as probable peritoneal diseasemost marked in the right upper quadrant extending about the right diaphragmatic leaflet. There was pelvic and suspected vertebral osseous metastatic diseasewith L1 compression fracture, likely pathologic.There was a possible 2 cm right lower lobe lung nodule.There was left hydronephrosisand proximal ureterectasis secondary to pelvic process (prior renal ultrasound on 10/16/2019 revealed no hydronephrosis).  Chest CTon 11/08/2019 revealed right pleural effusion with pleural metastasis(2.7 x 2.1 cm nodularity along anterior right hemidiaphragm). Osseous metastasis, including a dominant lesioninvolving the right-side of the L2vertebral body.   Bone scanon 11/08/2019 revealed widespread bony metastasis(right occiput, clavicles, left upper extremity, sternum, ribs bilaterally, throughout the cervical, thoracic, lumbar spine, bony pelvis, proximal femurs, right tibia). Patient declined biopsyfor NGS.  He received degarelixon 11/11/2019(last 06/28/2020). He began abirateroneon 11/14/2019.  Chest, abdomen, and pelvis CT on 12/04/2020 revealed interval increase in size of an enlarged left iliac node, as well as newly enlarged  perirectal and sigmoid mesocolon lymph nodes and soft tissue nodules. Although in a somewhat unusual distribution these were in keeping with previously biopsy proven metastatic prostate malignancy. There were no significant change in additional enlarged retroperitoneal lymph nodes. Findings were c/w worsened metastatic disease. There was redemonstrated diffuse sclerotic osseous metastatic disease, not significantly changed compared to prior examination and again notable for wedge deformity of L1, approximately 50% height loss. There was a new superior endplate deformity of L3, in addition to an existing inferior endplate Schmorl deformity. There was prostatomegaly. There was coronary artery disease and aortic atherosclerosis.  Bone scan on 12/04/2020 revealed osseous metastases within ribs, spine, pelvis, sternum, and RIGHT humeral diaphysis. When compared to the previous exam, new lesions were seen within the ribs and RIGHT humeral diaphysis as well as progressive uptake in the RIGHT acromion and RIGHT acetabulum.  Plain films of the right humerus on 12/06/2020 revealed subtle areas of sclerosis at the mid RIGHT humeral diaphysis, 1 of which may represent a subtle sclerotic metastasis to correspond to the bone scan finding. There were no sites of bone destruction, cortical thinning, or impending fracture.  PSAhas been followed: 32.82 on 12/22/2018, 33.08 on 12/30/2018, 122.41 on 05/27/2019, 2241.0 on 11/06/2019, 216 on 12/09/2019, 80.45 01/06/2020, 54.06 on 02/06/2020, 37.73 on 03/07/2020, 30.57 on 04/09/2020, 35.25 on 05/02/2020, 45.03 on 05/30/2020, 76.52 on 06/28/2020, 104.03  on 08/01/2020, 117.35 on 09/03/2020, 244.0 on 10/11/2020, and 467.0 on 11/14/2020.  Testosterone < 3 on 11/14/2020.   He has a monoclonal gammopathy of unknown significance (MGUS). SPEPon 10/16/2019 revealed a 0.4 gm/dL + an additional 0.2 gm/dL biclonal IgAprotein with kappa specificity. Bone marrowon 11/14/2019 revealed a  hypercellular bone marrow for age with metastatic carcinomac/w prostate cancer. There was slight plasmacytosis (4% plasma cells). Plasma cells displayed polyclonal staining for kappa and lambda light chains with kappa light chain excess. Findings were most suggestive of early involvement by a plasma cell neoplasm. Cytogenetics were normal (27, XX). 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  SPEP has been followed (gm/dL):  0.3 on 10/16/2019, 0.3 on 03/07/2020, and 0.2 on 08/01/2020.  He has a history of DVTin the upper deep calf vein in the region of the venous trifurcation on 07/04/2019.He was on Eliquis,but discontinued secondary to hematuria.IVC filter was placed on 11/07/2019.  He is hepatitis C positive.  Testing occurred on 10/17/2019.  Hepatitis C quantitative was not detected.  Given his age and prostate cancer, treatment for hepatitis C was not recommended.  He has stage IIIa chronic kidney disease. Creatinine is 1.4.  He has received both doses of the COVID-19 vaccine on 11/03/2019.   Symptomatically, he feels weak.  Diet is poor.  Blood sugar has been low.  He has intermittent chills and a chronic cough.  He has a recent history of a UTI.  Plan: 1.   Labs today: CBC with diff, CMP, SPEP, FLCA, PSA, [facility] genetic testing. 2. Metastatic prostatic prostate cancer Symptomatically, he feels weak. Hepresented with extensive pelvic, perirectal, retroperitoneal and mesenteric adenopathy. Hepresented with extensive osseous metastasis. He began degarelix and abiraterone + prednisone on 11/11/2019.    Bone scan on 07/11/2020 and CT scans on 07/11/2020 revealed a significant response to treatment.  Bone scan on 12/04/2020 and CT scans on 12/04/2020 were personally reviewed.  Agree with radiology findings.   He has progressive disease with increasing adenopathy and bone disease.   Discuss need for biopsy with Omniseq  testing to determine treatment options.    PARP inhibitor for BRCAm or HRRm.    Pembrolizumab for MSI-H, dMMR, TMB >= 10 mut/Mb.     Patient is not a candidate for Taxotere, but may be a candidate for cabazitaxel.  Consider focal radiation of radium 233 for multiple lesions if he has symptomatic bone metastasis. Discuss plan for biopsy to determine treatment options.  Labs reviewed.  Degarelix today. 3. Bone metastasis Patient with known bone metastasis.  Await dental clearance prior to initiation of Xgeva. 4.Normocytic anemia  Hematocrit 29.7.  Hemoglobin 9.7.  MCV 89.5 on 12/20/2020.  Ferritin 288 with iron saturation 24% and TIBC 230 on 10/11/2020.  B12 494 and folate 28.0 (normal) on 10/11/2020.  Etiology felt secondary to chronic disease or prostate cancer. 5. Monoclonal gammopathyof unknown significance (MGUS) SPEP on 10/16/2019 revealed a 0.4 gm/dL + an additional 0.2 gm/dL biclonal IgA protein with kappa specificity. Bone marrow aspirate and biopsy on 11/15/2019 revealed a hypercellular bone marrow with metastatic carcinoma c/w prostate cancer.  There was slight plasmacytosis (4% plasma cells). Plasma cells werepolyclonal. SPEP was 0.3 gm/dL on 03/07/2020 and 0.2 gm/dL on 08/01/2020.  Check SPEP at next blood draw. 6.   Left lower extremity DVT Duplex on 07/04/2019 revealedupper deep calf vein in the region of the venous trifurcation. IVC filterwas placed on 11/07/2019. He remains off anticoagulation secondary to hematuria.  Continue to monitor. 7.   Degarelix today.  8.   Patient to Urgent Care. 9.   CT guided biopsy (await IR call back). 10.   RTC 2 weeks after biopsy for MD assessment and discussion regarding direction of therapy.  I discussed the assessment and treatment plan with the patient.  The patient was  provided an opportunity to ask questions and all were answered.  The patient agreed with the plan and demonstrated an understanding of the instructions.  The patient was advised to call back if the symptoms worsen or if the condition fails to improve as anticipated.  I provided 30 minutes of face-to-face time during this this encounter and > 50% was spent counseling as documented under my assessment and plan.  An additional 10 minutes were spent reviewing his chart (Epic and Care Everywhere) including notes, labs, and imaging studies.    Lequita Asal, MD, PhD    12/20/2020, 10:36 AM  Carleene Cooper, am acting as scribe for [employer]. Mike Gip, MD, PhD.  I, Roselle Norton C. Mike Gip, MD, have reviewed the above documentation for accuracy and completeness, and I agree with the above.

## 2020-12-20 ENCOUNTER — Inpatient Hospital Stay: Payer: Medicare Other | Attending: Hematology and Oncology

## 2020-12-20 ENCOUNTER — Encounter: Payer: Self-pay | Admitting: Hematology and Oncology

## 2020-12-20 ENCOUNTER — Ambulatory Visit
Admission: EM | Admit: 2020-12-20 | Discharge: 2020-12-20 | Disposition: A | Discharge status: Home / Self Care | Payer: Medicare Other | Attending: Sports Medicine | Admitting: Sports Medicine

## 2020-12-20 ENCOUNTER — Other Ambulatory Visit: Payer: Self-pay

## 2020-12-20 ENCOUNTER — Inpatient Hospital Stay (HOSPITAL_BASED_OUTPATIENT_CLINIC_OR_DEPARTMENT_OTHER): Payer: Medicare Other | Admitting: Hematology and Oncology

## 2020-12-20 ENCOUNTER — Inpatient Hospital Stay: Payer: Medicare Other

## 2020-12-20 VITALS — BP 100/57 | HR 63 | Temp 97.4°F | Resp 16

## 2020-12-20 DIAGNOSIS — Z7189 Other specified counseling: Secondary | ICD-10-CM

## 2020-12-20 DIAGNOSIS — C61 Malignant neoplasm of prostate: Secondary | ICD-10-CM | POA: Insufficient documentation

## 2020-12-20 DIAGNOSIS — Z794 Long term (current) use of insulin: Secondary | ICD-10-CM | POA: Diagnosis not present

## 2020-12-20 DIAGNOSIS — Z87891 Personal history of nicotine dependence: Secondary | ICD-10-CM | POA: Diagnosis not present

## 2020-12-20 DIAGNOSIS — E119 Type 2 diabetes mellitus without complications: Secondary | ICD-10-CM | POA: Diagnosis not present

## 2020-12-20 DIAGNOSIS — R530 Neoplastic (malignant) related fatigue: Secondary | ICD-10-CM | POA: Diagnosis not present

## 2020-12-20 DIAGNOSIS — D649 Anemia, unspecified: Secondary | ICD-10-CM | POA: Insufficient documentation

## 2020-12-20 DIAGNOSIS — Z7901 Long term (current) use of anticoagulants: Secondary | ICD-10-CM | POA: Insufficient documentation

## 2020-12-20 DIAGNOSIS — Z5111 Encounter for antineoplastic chemotherapy: Secondary | ICD-10-CM | POA: Insufficient documentation

## 2020-12-20 DIAGNOSIS — Z7952 Long term (current) use of systemic steroids: Secondary | ICD-10-CM | POA: Diagnosis not present

## 2020-12-20 DIAGNOSIS — C7951 Secondary malignant neoplasm of bone: Secondary | ICD-10-CM

## 2020-12-20 DIAGNOSIS — Z79899 Other long term (current) drug therapy: Secondary | ICD-10-CM | POA: Diagnosis not present

## 2020-12-20 DIAGNOSIS — R531 Weakness: Secondary | ICD-10-CM | POA: Diagnosis not present

## 2020-12-20 DIAGNOSIS — N1831 Chronic kidney disease, stage 3a: Secondary | ICD-10-CM | POA: Diagnosis not present

## 2020-12-20 DIAGNOSIS — D472 Monoclonal gammopathy: Secondary | ICD-10-CM | POA: Insufficient documentation

## 2020-12-20 DIAGNOSIS — Z86718 Personal history of other venous thrombosis and embolism: Secondary | ICD-10-CM | POA: Insufficient documentation

## 2020-12-20 DIAGNOSIS — I824Y2 Acute embolism and thrombosis of unspecified deep veins of left proximal lower extremity: Secondary | ICD-10-CM

## 2020-12-20 LAB — CBC WITH DIFFERENTIAL/PLATELET
Abs Immature Granulocytes: 0.04 10*3/uL (ref 0.00–0.07)
Basophils Absolute: 0 10*3/uL (ref 0.0–0.1)
Basophils Relative: 0 %
Eosinophils Absolute: 0.2 10*3/uL (ref 0.0–0.5)
Eosinophils Relative: 2 %
HCT: 29.7 % — ABNORMAL LOW (ref 39.0–52.0)
Hemoglobin: 9.7 g/dL — ABNORMAL LOW (ref 13.0–17.0)
Immature Granulocytes: 1 %
Lymphocytes Relative: 11 %
Lymphs Abs: 0.8 10*3/uL (ref 0.7–4.0)
MCH: 29.2 pg (ref 26.0–34.0)
MCHC: 32.7 g/dL (ref 30.0–36.0)
MCV: 89.5 fL (ref 80.0–100.0)
Monocytes Absolute: 0.6 10*3/uL (ref 0.1–1.0)
Monocytes Relative: 8 %
Neutro Abs: 6.3 10*3/uL (ref 1.7–7.7)
Neutrophils Relative %: 78 %
Platelets: 202 10*3/uL (ref 150–400)
RBC: 3.32 MIL/uL — ABNORMAL LOW (ref 4.22–5.81)
RDW: 13.8 % (ref 11.5–15.5)
WBC: 7.9 10*3/uL (ref 4.0–10.5)
nRBC: 0 % (ref 0.0–0.2)

## 2020-12-20 LAB — COMPREHENSIVE METABOLIC PANEL
ALT: 18 U/L (ref 0–44)
AST: 22 U/L (ref 15–41)
Albumin: 3.2 g/dL — ABNORMAL LOW (ref 3.5–5.0)
Alkaline Phosphatase: 97 U/L (ref 38–126)
Anion gap: 12 (ref 5–15)
BUN: 23 mg/dL (ref 8–23)
CO2: 28 mmol/L (ref 22–32)
Calcium: 9.4 mg/dL (ref 8.9–10.3)
Chloride: 95 mmol/L — ABNORMAL LOW (ref 98–111)
Creatinine, Ser: 1.38 mg/dL — ABNORMAL HIGH (ref 0.61–1.24)
GFR, Estimated: 50 mL/min — ABNORMAL LOW (ref >60)
Glucose, Bld: 194 mg/dL — ABNORMAL HIGH (ref 70–99)
Potassium: 3.3 mmol/L — ABNORMAL LOW (ref 3.5–5.1)
Sodium: 135 mmol/L (ref 135–145)
Total Bilirubin: 1.1 mg/dL (ref 0.3–1.2)
Total Protein: 6.5 g/dL (ref 6.5–8.1)

## 2020-12-20 LAB — PSA: Prostatic Specific Antigen: 1006 ng/mL — ABNORMAL HIGH (ref 0.00–4.00)

## 2020-12-20 MED ORDER — DEGARELIX ACETATE 80 MG ~~LOC~~ SOLR
80.0000 mg | Freq: Once | SUBCUTANEOUS | Status: AC
  Administered 2020-12-20: 80 mg via SUBCUTANEOUS

## 2020-12-20 NOTE — Patient Instructions (Signed)
Cabazitaxel injection What is this medicine? CABAZITAXEL (ka baz i TAX el) is a chemotherapy drug. It is used to treat prostate cancer. It targets fast dividing cells, like cancer cells, and causes these cells to die. This medicine may be used for other purposes; ask your health care provider or pharmacist if you have questions. COMMON BRAND NAME(S): Jevtana What should I tell my health care provider before I take this medicine? They need to know if you have any of these conditions:  history of stomach bleeding  kidney disease  liver disease  low blood counts, like low white cell, platelet, or red cell counts  lung or breathing disease, like asthma  recent or ongoing radiation therapy  take medicines that treat or prevent blood clots  an unusual or allergic reaction to cabazitaxel, polysorbate 80, other medicines, foods, dyes, or preservatives  pregnant or trying to get pregnant  breast-feeding How should I use this medicine? This medicine is for infusion into a vein. It is given by a health care professional in a hospital or clinic setting. Talk to your pediatrician regarding the use of this medicine in children. Special care may be needed. Overdosage: If you think you have taken too much of this medicine contact a poison control center or emergency room at once. NOTE: This medicine is only for you. Do not share this medicine with others. What if I miss a dose? It is important not to miss your dose. Call your doctor or health care professional if you are unable to keep an appointment. What may interact with this medicine?  antiviral medicines for HIV or AIDS  clarithromycin  medicines for fungal infections like ketoconazole, fluconazole, itraconazole, and voriconazole  nefazodone  telithromycin This list may not describe all possible interactions. Give your health care provider a list of all the medicines, herbs, non-prescription drugs, or dietary supplements you  use. Also tell them if you smoke, drink alcohol, or use illegal drugs. Some items may interact with your medicine. What should I watch for while using this medicine? Your condition will be monitored carefully while you are receiving this medicine. This drug may make you feel generally unwell. This is not uncommon, as chemotherapy can affect healthy cells as well as cancer cells. Report any side effects. Continue your course of treatment even though you feel ill unless your doctor tells you to stop. Call your doctor or health care professional for advice if you get a fever, chills or sore throat, or other symptoms of a cold or flu. Do not treat yourself. This drug decreases your body's ability to fight infections. Try to avoid being around people who are sick. This medicine may increase your risk to bruise or bleed. Call your doctor or health care professional if you notice any unusual bleeding. Be careful brushing and flossing your teeth or using a toothpick because you may get an infection or bleed more easily. If you have any dental work done, tell your dentist you are receiving this medicine. Avoid taking products that contain aspirin, acetaminophen, ibuprofen, naproxen, or ketoprofen unless instructed by your doctor. These medicines may hide a fever. Do not become pregnant while taking this medicine. Women should inform their doctor if they wish to become pregnant or think they might be pregnant. Men should not father a child while taking this medicine and for 3 months after stopping it. There is a potential for serious side effects to an unborn child. Talk to your health care professional or pharmacist  for more information. Do not breast-feed an infant while taking this medicine. What side effects may I notice from receiving this medicine? Side effects that you should report to your doctor or health care professional as soon as possible:  allergic reactions like skin rash, itching or hives, swelling  of the face, lips, or tongue  blood in the urine  breathing problems  constipation  dark urine  diarrhea  pain in the lower back or side  pain, tingling, numbness in the hands or feet  pain when urinating  severe abdominal pain  signs of infection - fever or chills, cough, sore throat, pain or difficulty passing urine  signs and symptoms of kidney injury like trouble passing urine or change in the amount of urine  signs of decreased platelets or bleeding - bruising, pinpoint red spots on the skin, black, tarry stools, blood in the urine  signs of decreased red blood cells - unusually weak or tired, fainting spells, lightheadedness  vomiting Side effects that usually do not require medical attention (report to your doctor or health care professional if they continue or are bothersome):  back pain  change in taste  hair loss  headache  loss of appetite  muscle or joint pain  nausea  upset stomach This list may not describe all possible side effects. Call your doctor for medical advice about side effects. You may report side effects to FDA at 1-800-FDA-1088. Where should I keep my medicine? This drug is given in a hospital or clinic and will not be stored at home. NOTE: This sheet is a summary. It may not cover all possible information. If you have questions about this medicine, talk to your doctor, pharmacist, or health care provider.  2021 Elsevier/Gold Standard (2016-10-31 15:38:47)   Docetaxel injection What is this medicine? DOCETAXEL (doe se TAX el) is a chemotherapy drug. It targets fast dividing cells, like cancer cells, and causes these cells to die. This medicine is used to treat many types of cancers like breast cancer, certain stomach cancers, head and neck cancer, lung cancer, and prostate cancer. This medicine may be used for other purposes; ask your health care provider or pharmacist if you have questions. COMMON BRAND NAME(S): Docefrez,  Taxotere What should I tell my health care provider before I take this medicine? They need to know if you have any of these conditions:  infection (especially a virus infection such as chickenpox, cold sores, or herpes)  liver disease  low blood counts, like low white cell, platelet, or red cell counts  an unusual or allergic reaction to docetaxel, polysorbate 80, other chemotherapy agents, other medicines, foods, dyes, or preservatives  pregnant or trying to get pregnant  breast-feeding How should I use this medicine? This drug is given as an infusion into a vein. It is administered in a hospital or clinic by a specially trained health care professional. Talk to your pediatrician regarding the use of this medicine in children. Special care may be needed. Overdosage: If you think you have taken too much of this medicine contact a poison control center or emergency room at once. NOTE: This medicine is only for you. Do not share this medicine with others. What if I miss a dose? It is important not to miss your dose. Call your doctor or health care professional if you are unable to keep an appointment. What may interact with this medicine? Do not take this medicine with any of the following medications:  live virus vaccines This medicine  may also interact with the following medications:  aprepitant  certain antibiotics like erythromycin or clarithromycin  certain antivirals for HIV or hepatitis  certain medicines for fungal infections like fluconazole, itraconazole, ketoconazole, posaconazole, or voriconazole  cimetidine  ciprofloxacin  conivaptan  cyclosporine  dronedarone  fluvoxamine  grapefruit juice  imatinib  verapamil This list may not describe all possible interactions. Give your health care provider a list of all the medicines, herbs, non-prescription drugs, or dietary supplements you use. Also tell them if you smoke, drink alcohol, or use illegal drugs.  Some items may interact with your medicine. What should I watch for while using this medicine? Your condition will be monitored carefully while you are receiving this medicine. You will need important blood work done while you are taking this medicine. Call your doctor or health care professional for advice if you get a fever, chills or sore throat, or other symptoms of a cold or flu. Do not treat yourself. This drug decreases your body's ability to fight infections. Try to avoid being around people who are sick. Some products may contain alcohol. Ask your health care professional if this medicine contains alcohol. Be sure to tell all health care professionals you are taking this medicine. Certain medicines, like metronidazole and disulfiram, can cause an unpleasant reaction when taken with alcohol. The reaction includes flushing, headache, nausea, vomiting, sweating, and increased thirst. The reaction can last from 30 minutes to several hours. You may get drowsy or dizzy. Do not drive, use machinery, or do anything that needs mental alertness until you know how this medicine affects you. Do not stand or sit up quickly, especially if you are an older patient. This reduces the risk of dizzy or fainting spells. Alcohol may interfere with the effect of this medicine. Talk to your health care professional about your risk of cancer. You may be more at risk for certain types of cancer if you take this medicine. Do not become pregnant while taking this medicine or for 6 months after stopping it. Women should inform their doctor if they wish to become pregnant or think they might be pregnant. There is a potential for serious side effects to an unborn child. Talk to your health care professional or pharmacist for more information. Do not breast-feed an infant while taking this medicine or for 1 week after stopping it. Males who get this medicine must use a condom during sex with females who can get pregnant. If you  get a woman pregnant, the baby could have birth defects. The baby could die before they are born. You will need to continue wearing a condom for 3 months after stopping the medicine. Tell your health care provider right away if your partner becomes pregnant while you are taking this medicine. This may interfere with the ability to father a child. You should talk to your doctor or health care professional if you are concerned about your fertility. What side effects may I notice from receiving this medicine? Side effects that you should report to your doctor or health care professional as soon as possible:  allergic reactions like skin rash, itching or hives, swelling of the face, lips, or tongue  blurred vision  breathing problems  changes in vision  low blood counts - This drug may decrease the number of white blood cells, red blood cells and platelets. You may be at increased risk for infections and bleeding.  nausea and vomiting  pain, redness or irritation at site where injected  pain,  tingling, numbness in the hands or feet  redness, blistering, peeling, or loosening of the skin, including inside the mouth  signs of decreased platelets or bleeding - bruising, pinpoint red spots on the skin, black, tarry stools, nosebleeds  signs of decreased red blood cells - unusually weak or tired, fainting spells, lightheadedness  signs of infection - fever or chills, cough, sore throat, pain or difficulty passing urine  swelling of the ankle, feet, hands Side effects that usually do not require medical attention (report to your doctor or health care professional if they continue or are bothersome):  constipation  diarrhea  fingernail or toenail changes  hair loss  loss of appetite  mouth sores  muscle pain This list may not describe all possible side effects. Call your doctor for medical advice about side effects. You may report side effects to FDA at 1-800-FDA-1088. Where should  I keep my medicine? This drug is given in a hospital or clinic and will not be stored at home. NOTE: This sheet is a summary. It may not cover all possible information. If you have questions about this medicine, talk to your doctor, pharmacist, or health care provider.  2021 Elsevier/Gold Standard (2019-09-05 19:50:31)

## 2020-12-20 NOTE — Progress Notes (Signed)
Patient is feeling very weak today and his daughter had trouble getting him in the care today due to the weakness.  His Glucose was 74 at the facility this morning and he has since eaten.

## 2020-12-20 NOTE — Discharge Instructions (Signed)
I had a long discussion with his daughter that our facility was not equipped to handle his needs at the present time and I recommended transfer to the emergency room.  She requested to go to [facility] and EMS will transport him there. We did consider doing a UA as he has had recent UTIs but the patient was unable to stand on his own and my concern would be that we would just get a contaminated urine specimen.  Our nursing staff are unable to cath the patient so I would leave it to the ER to work that up further. Transfer to [facility] ER at this present time.

## 2020-12-20 NOTE — ED Provider Notes (Signed)
[facility]    CSN: [id number] Arrival date & time: 12/20/20  1104      History   Chief Complaint Chief Complaint  Patient presents with  . Weakness    HPI Brandon Hargrove Sr. is a 87 y.o. male.   Patient pleasant 87 year old male who presents with his daughter for evaluation of the above issue.  He has been followed by oncology for metastatic prostate cancer.  Last bone scan in February showed new lesions and worsening metastasis.  Was seen in oncology this morning and had his infusion.  Patient's daughter indicates that he is having progressive weakness to the point where she was having an extreme difficult time getting him in the vehicle for this appointment today.  He resides at a skilled nursing facility and the daughter reports that there is more and more difficulty with getting him to ambulate.  There is a lot of daytime somnolence as well.  He has had recent UTIs and the daughter wonders whether or not he has another one.  Patient denies any urinary symptoms.  No dysuria, increased frequency, or urgency.  Patient also denies chest pain or shortness of breath.     Past Medical History:  Diagnosis Date  . Anemia, normocytic normochromic   . BPH (benign prostatic hyperplasia)   . CAD (coronary artery disease)   . Cataracts, bilateral   . Chronic renal insufficiency   . Colon polyp   . COPD (chronic obstructive pulmonary disease) ([place])   . Diabetes mellitus without complication ([place])   . DVT (deep venous thrombosis) ([place])   . Edema of both legs   . Glaucoma   . Gout   . Heart murmur   . Hepatitis C antibody test positive 10/20/2019  . Hyperlipidemia   . Hypertension   . Moderate persistent asthma without complication   . Myocardial infarction ([place])   . Neuromuscular disorder ([place])   . Neuropathy   . Peripheral vascular disease ([place])   . Premature ejaculation   . Prostate cancer ([place])   . RLS (restless legs syndrome)   . Sleep apnea   . Stented  coronary artery 11/13/2015  . Uncontrolled type 2 diabetes mellitus with stage 3 chronic kidney disease, with long-term current use of insulin [facility])     Patient Active Problem List   Diagnosis Date Noted  . Weight loss 06/28/2020  . Normocytic anemia 02/02/2020  . Multiple falls 12/12/2019  . Hyponatremia 12/11/2019  . Acute lower UTI 12/11/2019  . Monoclonal gammopathy of unknown significance (MGUS)   . Palliative care by specialist   . Goals of care, counseling/discussion   . Prostate cancer metastatic to bone ([place]) 11/07/2019  . Elevated INR 11/05/2019  . Acute blood loss anemia 11/05/2019  . OSA on CPAP 11/05/2019  . Dyslipidemia 11/05/2019  . Morbid obesity ([place]) 11/05/2019  . Deep vein thrombosis (DVT) of proximal vein of left lower extremity ([place]) 11/05/2019  . Hematuria 11/04/2019  . Hepatitis C antibody test positive 10/20/2019  . Anasarca 10/15/2019  . Acute exacerbation of CHF (congestive heart failure) ([place]) 10/15/2019  . CAD (coronary artery disease) 10/15/2019  . Insulin dependent type 2 diabetes mellitus ([place]) 10/15/2019  . Essential hypertension 10/15/2019  . Chronic anticoagulation 10/15/2019  . Acute kidney injury superimposed on CKD ([place]) 10/15/2019  . CKD (chronic kidney disease) stage 3, GFR 30-59 ml/min (HCC) 10/15/2019  . Generalized weakness 10/15/2019  . Frequent falls 10/15/2019  . Elevated liver enzymes 10/15/2019  . Chronic diastolic CHF (  congestive heart failure) ([place]) 10/15/2019  . Elevated LFTs   . Cellulitis of left lower extremity 07/04/2019  . Bilateral leg edema 01/31/2019  . Hyperlipidemia, mixed 01/31/2019  . Bladder outlet obstruction 01/18/2019  . History of recurrent UTI (urinary tract infection) 01/18/2019  . Fall at home, initial encounter 11/17/2018  . Acute cystitis without hematuria 10/03/2018  . Full code status 10/03/2018  . Loose stools 10/03/2018  . Tinea unguium 06/16/2016  . Moderate persistent asthma without  complication 46/96/2952  . Glaucoma 04/12/2015  . Status post bilateral cataract extraction 04/12/2015  . Prostate cancer ([place]) 07/13/2013  . Diabetic autonomic neuropathy associated with type 2 diabetes mellitus ([place]) 02/10/2013  . Lumbar spinal stenosis 06/21/2012  . Vitamin D deficiency 03/22/2010    Past Surgical History:  Procedure Laterality Date  . APPENDECTOMY    . [place]  2009  . COLONOSCOPY  2012  . EYE SURGERY    . IVC FILTER INSERTION N/A 11/07/2019   Procedure: IVC FILTER INSERTION;  Surgeon: Algernon Huxley, MD;  Location: [facility];  Service: Cardiovascular;  Laterality: N/A;  . PROSTATE SURGERY    . TONSILLECTOMY         Home Medications    Prior to Admission medications   Medication Sig Start Date End Date Taking? Authorizing Provider  abiraterone acetate (ZYTIGA) 250 MG tablet TAKE 4 TABLETS BY MOUTH DAILY ON AN EMPTY STOMACH 1 HOUR BEFORE OR 2 HOURS AFTER MEALS 09/27/20  Yes Corcoran, Melissa C, MD  amLODipine (NORVASC) 10 MG tablet Take 10 mg by mouth daily. 06/11/20  Yes [provider]  atorvastatin (LIPITOR) 80 MG tablet Take 80 mg by mouth daily. 11/15/15  Yes [provider]  carvedilol (COREG) 25 MG tablet Take 25 mg by mouth 2 (two) times daily with a meal.   Yes [provider]  Cholecalciferol (VITAMIN D) 50 MCG (2000 UT) tablet Take 2,000 Units by mouth daily.    Yes [provider]  DULoxetine (CYMBALTA) 60 MG capsule Take 60 mg by mouth daily.   Yes [provider]  famotidine (PEPCID) 20 MG tablet Take 20 mg by mouth at bedtime as needed for heartburn.  08/26/19 12/20/20 Yes [provider]  finasteride (PROSCAR) 5 MG tablet Take 5 mg by mouth daily. 05/22/14  Yes [provider]  furosemide (LASIX) 20 MG tablet Take 20 mg by mouth.   Yes [provider]  gabapentin (NEURONTIN) 100 MG capsule Take 1 capsule (100 mg total) by mouth 2 (two) times daily. Patient  taking differently: Take 100 mg by mouth at bedtime. 12/15/19  Yes Lavina Hamman, MD  insulin aspart (NOVOLOG) 100 UNIT/ML injection Inject 5 Units into the skin 3 (three) times daily with meals. Patient taking differently: Inject 12 Units into the skin 3 (three) times daily with meals. 12 units breakfast and lunch and then 6 units at dinner 11/17/19  Yes Fritzi Mandes, MD  Insulin Detemir (LEVEMIR) 100 UNIT/ML Pen Inject 15 Units into the skin daily. Patient taking differently: Inject 25 Units into the skin daily. 12/15/19  Yes Lavina Hamman, MD  melatonin 5 MG TABS Take 5 mg by mouth at bedtime.   Yes [provider]  mirabegron ER (MYRBETRIQ) 25 MG TB24 tablet Take 25 mg by mouth daily.   Yes [provider]  Multiple Vitamin (MULTI-VITAMIN) tablet Take 1 tablet by mouth daily.   Yes [provider]  nitroGLYCERIN (NITROSTAT) 0.4 MG SL tablet Place  0.4 mg under the tongue every 5 (five) minutes as needed for chest pain.   Yes [provider]  predniSONE (DELTASONE) 5 MG tablet TAKE 1 TABLET BY MOUTH DAILY WITH BREAKFAST. BEGIN TAKING WITH INITIATION OF ABIRATERONE. 12/06/19  Yes Corcoran, Melissa C, MD  senna-docusate (SENOKOT-S) 8.6-50 MG tablet Take 1 tablet by mouth 2 (two) times daily. 12/15/19  Yes Lavina Hamman, MD  Skin Protectants, Misc. (EUCERIN) cream Apply 1 application topically daily.   Yes [provider]  traZODone (DESYREL) 50 MG tablet Take 50 mg by mouth at bedtime. 07/18/20  Yes [provider]  UNIFINE SAFECONTROL PEN NEEDLE 30G X 5 MM [place]  06/26/20  Yes [provider]  cephALEXin (KEFLEX) 500 MG capsule Take 1 capsule (500 mg total) by mouth 2 (two) times daily. Patient not taking: Reported on 12/20/2020 11/12/20   Ward, Delice Bison, DO  polyethylene glycol (MIRALAX / GLYCOLAX) 17 g packet Take 17 g by mouth daily. Patient not taking: Reported on 12/20/2020 12/16/19   Lavina Hamman, MD    Family History No family  history on file.  Social History Social History   Tobacco Use  . Smoking status: Former Research scientist (life sciences)  . Smokeless tobacco: Never Used  Vaping Use  . Vaping Use: Never used     Allergies   Metformin and related   Review of Systems Review of Systems  Constitutional: Positive for activity change and fatigue. Negative for chills, diaphoresis and fever.  HENT: Negative.   Respiratory: Negative for cough, chest tightness, shortness of breath, wheezing and stridor.   Cardiovascular: Positive for leg swelling. Negative for chest pain and palpitations.  Gastrointestinal: Negative for abdominal pain, diarrhea, nausea and vomiting.  Genitourinary: Negative for dysuria, flank pain, frequency, hematuria and urgency.  Musculoskeletal: Positive for arthralgias.  Skin: Negative.   Neurological: Positive for weakness. Negative for dizziness, light-headedness, numbness and headaches.  All other systems reviewed and are negative.    Physical Exam Triage Vital Signs ED Triage Vitals  Enc Vitals Group     BP 12/20/20 1127 (!) 112/52     Pulse Rate 12/20/20 1127 61     Resp 12/20/20 1127 18     Temp 12/20/20 1127 97.7 F (36.5 C)     Temp Source 12/20/20 1127 Oral     SpO2 12/20/20 1127 99 %     Weight 12/20/20 1124 247 lb (112 kg)     Height 12/20/20 1124 5\' 11"  (1.803 m)     Head Circumference --      Peak Flow --      Pain Score --      Pain Loc --      Pain Edu? --      Excl. in [place]? --    No data found.  Updated Vital Signs BP (!) 112/52 (BP Location: Left Arm)   Pulse 61   Temp 97.7 F (36.5 C) (Oral)   Resp 18   Ht 5\' 11"  (1.803 m)   Wt 112 kg   SpO2 99%   BMI 34.45 kg/m   Visual Acuity Right Eye Distance:   Left Eye Distance:   Bilateral Distance:    Right Eye Near:   Left Eye Near:    Bilateral Near:     Physical Exam Vitals and nursing note reviewed.  Constitutional:      General: He is sleeping.     Appearance: He is not ill-appearing, toxic-appearing or  diaphoretic.     Comments:  Patient is somnolent and sleeping at times throughout the history and examination.  He is arousable and answers questions appropriately.  Sitting in the wheelchair and unable to ambulate.  HENT:     Head: Normocephalic and atraumatic.     Mouth/Throat:     Mouth: Mucous membranes are dry.     Pharynx: No oropharyngeal exudate or posterior oropharyngeal erythema.  Eyes:     Extraocular Movements: Extraocular movements intact.     Conjunctiva/sclera: Conjunctivae normal.     Pupils: Pupils are equal, round, and reactive to light.  Cardiovascular:     Rate and Rhythm: Normal rate and regular rhythm.     Pulses: Normal pulses.     Heart sounds: Normal heart sounds. No murmur heard. No friction rub. No gallop.   Pulmonary:     Effort: Pulmonary effort is normal. No respiratory distress.     Breath sounds: Normal breath sounds. No stridor. No wheezing, rhonchi or rales.  Abdominal:     General: There is distension.     Tenderness: There is no abdominal tenderness. There is no guarding or rebound.  Musculoskeletal:     Cervical back: Normal range of motion and neck supple. No rigidity or tenderness.  Skin:    General: Skin is warm and dry.     Capillary Refill: Capillary refill takes less than 2 seconds.  Neurological:     Mental Status: He is easily aroused. He is lethargic.      UC Treatments / Results  Labs (all labs ordered are listed, but only abnormal results are displayed) Labs Reviewed - No data to display  EKG   Radiology No results found.  Procedures Procedures (including critical care time)  Medications Ordered in UC Medications - No data to display  Initial Impression / Assessment and Plan / UC Course  I have reviewed the triage vital signs and the nursing notes.  Pertinent labs & imaging results that were available during my care of the patient were reviewed by me and considered in my medical decision making (see chart for  details).  Clinical impression: 87 year old male with metastatic prostate cancer.  Review of his chart indicates that it seems to progress.  He is in a skilled nursing facility but has really decompensated over the past few weeks with daytime somnolence, weakness, and an inability to ambulate.  Was seen in the cancer center earlier today and was sent over to the urgent care.  Treatment plan: 1.  The findings and treatment plan were discussed in detail with the patient and his daughter.  All parties were in agreement voiced verbal understanding. 2.  I had a long discussion with his daughter that our facility was not equipped to handle his needs at the present time and I recommended transfer to the emergency room.  She requested to go to [facility] and EMS will transport him there. 3.  We did consider doing a UA as he has had recent UTIs but the patient was unable to stand on his own and my concern would be that we would just get a contaminated urine specimen.  Our nursing staff are unable to cath the patient so I would leave it to the ER to work that up further. 4.  Transfer to [facility] ER at this present time.    Final Clinical Impressions(s) / UC Diagnoses   Final diagnoses:  Generalized weakness  Neoplastic malignant related fatigue  Prostate cancer [facility])     Discharge Instructions  I had a long discussion with his daughter that our facility was not equipped to handle his needs at the present time and I recommended transfer to the emergency room.  She requested to go to [facility] and EMS will transport him there. We did consider doing a UA as he has had recent UTIs but the patient was unable to stand on his own and my concern would be that we would just get a contaminated urine specimen.  Our nursing staff are unable to cath the patient so I would leave it to the ER to work that up further. Transfer to [facility] ER at this present time.    ED Prescriptions     None     PDMP not reviewed this encounter.   Verda Cumins, MD 12/20/20 [phone]

## 2020-12-20 NOTE — ED Triage Notes (Signed)
Pt c/o generally not feeling well for several days. Pt's daughter reports possible hypoglycemia off and on for several weeks. Pt is very sleepy during triage but does deny any urinary symptoms. Pt's daughter states pt is also very weak, more than usual.

## 2020-12-20 NOTE — ED Notes (Signed)
Patient is being discharged from the Urgent Care and sent to the Emergency Department via EMS . Per Dr. Drema Dallas, patient is in need of higher level of care due to weakness, fatigue and lethargy. Patient is aware and verbalizes understanding of plan of care.  Vitals:   12/20/20 1127  BP: (!) 112/52  Pulse: 61  Resp: 18  Temp: 97.7 F (36.5 C)  SpO2: 99%

## 2020-12-21 ENCOUNTER — Other Ambulatory Visit: Payer: Self-pay | Admitting: *Deleted

## 2020-12-21 DIAGNOSIS — C7951 Secondary malignant neoplasm of bone: Secondary | ICD-10-CM

## 2020-12-21 LAB — KAPPA/LAMBDA LIGHT CHAINS
Kappa free light chain: 38.8 mg/L — ABNORMAL HIGH (ref 3.3–19.4)
Kappa, lambda light chain ratio: 1.45 (ref 0.26–1.65)
Lambda free light chains: 26.8 mg/L — ABNORMAL HIGH (ref 5.7–26.3)

## 2020-12-24 LAB — PROTEIN ELECTROPHORESIS, SERUM
A/G Ratio: 1.1 (ref 0.7–1.7)
Albumin ELP: 3.2 g/dL (ref 2.9–4.4)
Alpha-1-Globulin: 0.3 g/dL (ref 0.0–0.4)
Alpha-2-Globulin: 0.9 g/dL (ref 0.4–1.0)
Beta Globulin: 0.8 g/dL (ref 0.7–1.3)
Gamma Globulin: 0.8 g/dL (ref 0.4–1.8)
Globulin, Total: 2.9 g/dL (ref 2.2–3.9)
M-Spike, %: 0.3 g/dL — ABNORMAL HIGH
Total Protein ELP: 6.1 g/dL (ref 6.0–8.5)

## 2020-12-26 ENCOUNTER — Ambulatory Visit: Payer: Medicare Other | Admitting: Internal Medicine

## 2020-12-27 ENCOUNTER — Telehealth: Payer: Self-pay | Admitting: *Deleted

## 2020-12-27 ENCOUNTER — Telehealth: Payer: Self-pay | Admitting: Hematology and Oncology

## 2020-12-27 NOTE — Telephone Encounter (Signed)
Re:  Hospitalization follow-up  I spoke to the patient's daughter [name] [phone]) regarding his hospitalization at [facility].  He was treated for a UTI with IV antibiotics.  He was discharged yesterday to [facility] in [place].  This is a skilled nursing facility.  Symptomatically, he has improved.  She is unclear about how long he will be staying at this facility.  We discussed my conversations with radiology as well as pathology.  Lymph node biopsy would be difficult given the proximity to blood vessels.  Bone biopsy would be problematic for sending for [facility].   We also discussed the possibility of a prostate biopsy. We discussed the possibility of NGS liquid biopsy.  She stated that she would discuss these options with her father to determine direction of therapy.    I also encouraged her to give my number at the clinic to the physician running the skilled nursing facility.  It is important for him to continue his degarelix every month.  He last received degarelix on 12/20/2020.   Lequita Asal, MD

## 2020-12-27 NOTE — Telephone Encounter (Signed)
Daughter [name] called requesting a return call from Dr Mike Gip to dis cuss patient and where to go form here. She states that patient has been hospitalized since Dr C sent him to Urgent Care Please return her call [phone]

## 2020-12-31 NOTE — Telephone Encounter (Signed)
[name], have you been able to contact patient's daughter?

## 2021-01-01 NOTE — Telephone Encounter (Signed)
[name] did speak with patients daughter, daughter is going to speak with patient about liquid biopsy vs. Prostate biopsy. Will follow up with daughter to see what they have decided.

## 2021-01-02 ENCOUNTER — Telehealth: Payer: Self-pay | Admitting: Hematology and Oncology

## 2021-01-02 NOTE — Telephone Encounter (Signed)
Patient is in facility in [facility]  Daughter is calling to schedule his injection appt  Says facility will coordinate transportation to here for injection

## 2021-01-02 NOTE — Telephone Encounter (Signed)
01/02/2021 Spoke w/ pts daughter, and got him scheduled for 01/21/21 for his next inj. She did want to know what is next with his treatment plan. She says she spoke w/ Dr. Loletha Grayer about some options but wants to know what is coming up [facility]

## 2021-01-03 ENCOUNTER — Encounter: Payer: Self-pay | Admitting: *Deleted

## 2021-01-03 NOTE — Telephone Encounter (Signed)
  Sounds good.  M  

## 2021-01-03 NOTE — Telephone Encounter (Signed)
Patient is scheduled for injection only on 4/4, do you want to add lab/MD visit to this appt? I know you had discussed possible liquid biopsy vs prostate biopsy with his sister prior. If we have him coming from facility we can possibly do liquid biopsy and visit for any follow up questions if ok with you.

## 2021-01-03 NOTE — Telephone Encounter (Signed)
Scheduling message sent. 

## 2021-01-18 ENCOUNTER — Other Ambulatory Visit (HOSPITAL_COMMUNITY): Payer: Self-pay

## 2021-01-18 NOTE — Progress Notes (Incomplete)
[name]  [address] [place], [name] [address] Phone: [phone]  Fax: [phone]   Clinic Day:  01/17/2021  Referring physician: Lesia Hausen, PA  Chief Complaint: [name] is a 87 y.o. male with metastatic prostate cancer on abiraterone and stage III chronic kidney disease who is seen for 1 month assessment, review of interval imaging, and monthly Degarelix.  HPI: The patient was last seen in the medical oncology clinic on 12/20/2020. At that time, he felt alright. He recently had 3 falls. He denied bone pain. He required assistance with ADLs. Exam was stable. Hematocrit was 29.7, hemoglobin 9.7, MCV 89.5, platelets 202,000, WBC 7,900. Potassium was 3.3.Creatinine was 1.38 (CrCl 50 ml/min). Albumin was 3.2. PSA was 1,006.00. M spike was 0.3 gm/dL. Kappa free light chains were 38.8, lambda free light chains 26.8, ratio 1.45. We discussed his progressive disease and potential plans for future therapy.  He was felt not to be a candidate for chemotherapy.  He received Degarelix.  The patient went to [facility] after his appointment for progressive weakness thought to be due to a UTI. He was admitted to [facility] from 12/20/2020 - 12/26/2020. UA on presentation with pyuria and hematuria, no nitrites. He improved on ceftriaxone, which was then transitioned to Augmentin. He was discharged to [facility] in [place].  I spoke to his daughter, [name], on 12/27/2020.  We discussed my conversations with radiology as well as pathology.  Lymph node biopsy would be difficult given the proximity to blood vessels. Bone biopsy would be problematic for sending for [facility].   We discussed the possibility of a prostate biopsy. We discussed NGS liquid biopsy.  She stated that she would discuss these options with her father to determine direction of therapy.    The patient was seen in the [facility] ER on 12/29/2020 after a fall. Work-up was  negative.  Chest CT angiogram with contrast revealed left hilar, para esophageal, and right inferior cardiophrenic angle lymphadenopathy measuring up to 1.6 cm; indeterminate. Follow-up CT in 3 months was recommended. A lip laceration was repaired. Potassium was 2.8.  He was prescribed a 10 day course of oral potassium.  Labs on 01/14/2021 revealed a hematocrit of 29.6, hemoglobin 10.1, MCV 84.3, platelets 333,000, WBC 6400 with an ANC of 4600.  During the interim, ***   Past Medical History:  Diagnosis Date  . Anemia, normocytic normochromic   . BPH (benign prostatic hyperplasia)   . CAD (coronary artery disease)   . Cataracts, bilateral   . Chronic renal insufficiency   . Colon polyp   . COPD (chronic obstructive pulmonary disease) ([place])   . Diabetes mellitus without complication ([place])   . DVT (deep venous thrombosis) ([place])   . Edema of both legs   . Glaucoma   . Gout   . Heart murmur   . Hepatitis C antibody test positive 10/20/2019  . Hyperlipidemia   . Hypertension   . Moderate persistent asthma without complication   . Myocardial infarction ([place])   . Neuromuscular disorder ([place])   . Neuropathy   . Peripheral vascular disease ([place])   . Premature ejaculation   . Prostate cancer ([place])   . RLS (restless legs syndrome)   . Sleep apnea   . Stented coronary artery 11/13/2015  . Uncontrolled type 2 diabetes mellitus with stage 3 chronic kidney disease, with long-term current use of insulin ([place])     Past Surgical History:  Procedure Laterality Date  .  APPENDECTOMY    . [place]  2009  . COLONOSCOPY  2012  . EYE SURGERY    . IVC FILTER INSERTION N/A 11/07/2019   Procedure: IVC FILTER INSERTION;  Surgeon: Algernon Huxley, MD;  Location: [place] [facility];  Service: Cardiovascular;  Laterality: N/A;  . PROSTATE SURGERY    . TONSILLECTOMY      No family history on file.  Social History:  reports that he has quit smoking. He has never used smokeless tobacco.  No history on file for alcohol use and drug use. He previously smoked <1 pack/day x 20 years (stopped smoking with cardiac stent placement). He previously drank liquor on the weekend (stopped 2020). He is retired Dance movement psychotherapist). He previously lived outside of [place]. He then moved to [place] then [place] to live with his daughter Vale Mousseau [phone]). His wife died of cancer 1 year ago.He is currently living at [facility] assisted living. The patient is alone*** today.  Allergies:  Allergies  Allergen Reactions  . Metformin And Related Diarrhea    Intolerance due to naturally loose bowels    Current Medications: Current Outpatient Medications  Medication Sig Dispense Refill  . abiraterone acetate (ZYTIGA) 250 MG tablet TAKE 4 TABLETS BY MOUTH DAILY ON AN EMPTY STOMACH 1 HOUR BEFORE OR 2 HOURS AFTER MEALS 120 tablet 1  . amLODipine (NORVASC) 10 MG tablet Take 10 mg by mouth daily.    Marland Kitchen atorvastatin (LIPITOR) 80 MG tablet Take 80 mg by mouth daily.    . carvedilol (COREG) 25 MG tablet Take 25 mg by mouth 2 (two) times daily with a meal.    . cephALEXin (KEFLEX) 500 MG capsule Take 1 capsule (500 mg total) by mouth 2 (two) times daily. (Patient not taking: Reported on 12/20/2020) 14 capsule 0  . Cholecalciferol (VITAMIN D) 50 MCG (2000 UT) tablet Take 2,000 Units by mouth daily.     . DULoxetine (CYMBALTA) 60 MG capsule Take 60 mg by mouth daily.    . famotidine (PEPCID) 20 MG tablet Take 20 mg by mouth at bedtime as needed for heartburn.     . finasteride (PROSCAR) 5 MG tablet Take 5 mg by mouth daily.    . furosemide (LASIX) 20 MG tablet Take 20 mg by mouth.    . gabapentin (NEURONTIN) 100 MG capsule Take 1 capsule (100 mg total) by mouth 2 (two) times daily. (Patient taking differently: Take 100 mg by mouth at bedtime.) 60 capsule 0  . insulin aspart (NOVOLOG) 100 UNIT/ML injection Inject 5 Units into the skin 3 (three) times daily with meals. (Patient  taking differently: Inject 12 Units into the skin 3 (three) times daily with meals. 12 units breakfast and lunch and then 6 units at dinner) 10 mL 11  . Insulin Detemir (LEVEMIR) 100 UNIT/ML Pen Inject 15 Units into the skin daily. (Patient taking differently: Inject 25 Units into the skin daily.) 20 mL 0  . melatonin 5 MG TABS Take 5 mg by mouth at bedtime.    . mirabegron ER (MYRBETRIQ) 25 MG TB24 tablet Take 25 mg by mouth daily.    . Multiple Vitamin (MULTI-VITAMIN) tablet Take 1 tablet by mouth daily.    . nitroGLYCERIN (NITROSTAT) 0.4 MG SL tablet Place 0.4 mg under the tongue every 5 (five) minutes as needed for chest pain.    . polyethylene glycol (MIRALAX / GLYCOLAX) 17 g packet Take 17 g by mouth daily. (Patient not taking: Reported on 12/20/2020)  14 each 0  . predniSONE (DELTASONE) 5 MG tablet TAKE 1 TABLET BY MOUTH DAILY WITH BREAKFAST. BEGIN TAKING WITH INITIATION OF ABIRATERONE. 30 tablet 0  . senna-docusate (SENOKOT-S) 8.6-50 MG tablet Take 1 tablet by mouth 2 (two) times daily. 10 tablet 0  . Skin Protectants, Misc. (EUCERIN) cream Apply 1 application topically daily.    . traZODone (DESYREL) 50 MG tablet Take 50 mg by mouth at bedtime.    Marland Kitchen UNIFINE SAFECONTROL PEN NEEDLE 30G X 5 MM MISC      No current facility-administered medications for this visit.   Review of Systems  Constitutional: Negative for chills, diaphoresis, fever, malaise/fatigue and weight loss (up 1 lb since 08/2020).       Feels "alright".  HENT: Negative for congestion, ear discharge, ear pain, hearing loss, nosebleeds, sinus pain, sore throat and tinnitus.   Eyes: Negative for blurred vision and double vision.  Respiratory: Negative for cough, hemoptysis, sputum production and shortness of breath.   Cardiovascular: Positive for leg swelling (chronic). Negative for chest pain and palpitations.  Gastrointestinal: Negative for abdominal pain, blood in stool, constipation, diarrhea, heartburn, melena, nausea and  vomiting.       Poor diet.  Genitourinary: Negative for dysuria, frequency, hematuria and urgency.  Musculoskeletal: Positive for falls (3 x on Saturday) and joint pain (right hip s/p fall). Negative for back pain, myalgias and neck pain.       Right hand swellings s/p fall  Skin: Negative for itching and rash.       Left forearm scraped s/p fall  Neurological: Negative for dizziness, tingling, sensory change, weakness and headaches.  Endo/Heme/Allergies: Does not bruise/bleed easily.       Diabetes; up and down  Psychiatric/Behavioral: Negative for depression and memory loss. The patient is not nervous/anxious and does not have insomnia.   All other systems reviewed and are negative.  Performance status (ECOG): 3***  Vitals There were no vitals taken for this visit.   Physical Exam Vitals and nursing note reviewed.  Constitutional:      General: He is not in acute distress.    Appearance: He is not diaphoretic.     Comments: Patient examined in a wheelchair.  HENT:     Head: Normocephalic and atraumatic.     Mouth/Throat:     Mouth: Mucous membranes are moist.     Pharynx: Oropharynx is clear.  Eyes:     General: No scleral icterus.    Extraocular Movements: Extraocular movements intact.     Conjunctiva/sclera: Conjunctivae normal.     Pupils: Pupils are equal, round, and reactive to light.     Comments: Brown eyes.  Cardiovascular:     Rate and Rhythm: Normal rate and regular rhythm.     Heart sounds: Normal heart sounds. No murmur heard.   Pulmonary:     Effort: Pulmonary effort is normal. No respiratory distress.     Breath sounds: Normal breath sounds. No wheezing or rales.  Chest:     Chest wall: No tenderness.  Breasts:     Right: No axillary adenopathy or supraclavicular adenopathy.     Left: No axillary adenopathy or supraclavicular adenopathy.    Abdominal:     General: Bowel sounds are normal. There is no distension.     Palpations: Abdomen is soft.  There is no mass.     Tenderness: There is no abdominal tenderness. There is no guarding or rebound.  Musculoskeletal:        General:  Swelling (right hand s/p fall) present. No tenderness. Normal range of motion.     Cervical back: Normal range of motion and neck supple.     Right lower leg: Edema (chronic) present.     Left lower leg: Edema (chronic) present.  Lymphadenopathy:     Head:     Right side of head: No preauricular, posterior auricular or occipital adenopathy.     Left side of head: No preauricular, posterior auricular or occipital adenopathy.     Cervical: No cervical adenopathy.     Upper Body:     Right upper body: No supraclavicular or axillary adenopathy.     Left upper body: No supraclavicular or axillary adenopathy.     Lower Body: No right inguinal adenopathy. No left inguinal adenopathy.  Skin:    General: Skin is warm and dry.     Comments: Scratches on left forearm and wrist s/p fall.  Neurological:     Mental Status: He is alert and oriented to person, place, and time.  Psychiatric:        Behavior: Behavior normal.        Thought Content: Thought content normal.        Judgment: Judgment normal.    Imaging studies: 11/05/2019:  Renal stone CTrevealed confluentnodular massesadjacent to the prostate and distal sigmoid colon with left pelvic, perirectal, retroperitoneal, and mesenteric adenopathy as well as probable peritoneal diseasemost marked in the right upper quadrant extending about the right diaphragmatic leaflet. There was pelvic and suspected vertebral osseous metastatic diseasewith L1 compression fracture, likely pathologic.There was a possible 2 cm right lower lobe lung nodule.There was left hydronephrosisand proximal ureterectasis secondary to pelvic process (prior renal ultrasound on 10/16/2019 revealed no hydronephrosis). 11/08/2019:  Chest CTrevealed right pleural effusion with pleural metastasis(2.7 x 2.1 cm nodularity along anterior  right hemidiaphragm). Osseous metastasis, including a dominant lesioninvolving the right-side of the L2vertebral body.  11/08/2019:  Bone scanrevealed widespread bony metastasis(right occiput, clavicles, left upper extremity, sternum, ribs bilaterally, throughout the cervical, thoracic, lumbar spine, bony pelvis, proximal femurs, right tibia).  07/11/2020:  Bone scan revealed interval response to therapy with the resolution or varying degrees of diminishing uptake within widespread skeletal metastases noted on prior examination. Left hydronephrosis resolved. There was increased focal uptake within the medial ankles bilaterally, left greater than right, suggestive of interval trauma since prior examination. Correlation with plain film examination may be helpful for further evaluation. 07/18/2020:  Chest, abdomen and pelvis CT without contrast revealed significant interval reduction in previously seen bulky lymphadenopathy throughout the pelvis and retroperitoneum, c/w treatment response. There was interval increase in extensive sclerotic osseous metastatic disease throughout the included skeleton, likely reflecting post treatment change of existing osseous metastatic disease. Multiple subtle wedge and endplate deformities were unchanged, most notably a wedge deformity of L1 with approximately 50% height loss. There was prostatomegaly. There was coronary artery disease and aortic atherosclerosis.   No visits with results within 3 Day(s) from this visit.  Latest known visit with results is:  Appointment on 12/20/2020  Component Date Value Ref Range Status  . Prostatic Specific Antigen 12/20/2020 1,006.00* 0.00 - 4.00 ng/mL Final   Comment: (NOTE) While PSA levels of <=4.0 ng/ml are reported as reference range, some men with levels below 4.0 ng/ml can have prostate cancer and many men with PSA above 4.0 ng/ml do not have prostate cancer.  Other tests such as free PSA, age specific reference ranges,  PSA velocity and PSA doubling time may be helpful  especially in men less than 74 years old. Performed at [facility], [place] [address] [place], [place] [address]   . Kappa free light chain 12/20/2020 38.8* 3.3 - 19.4 mg/L Final  . Lamda free light chains 12/20/2020 26.8* 5.7 - 26.3 mg/L Final  . Kappa, lamda light chain ratio 12/20/2020 1.45  0.26 - 1.65 Final   Comment: (NOTE) Performed At: [facility] [place], [place] [id number] Rush Farmer MD YT:[id number]   . Total Protein ELP 12/20/2020 6.1  6.0 - 8.5 g/dL Final  . Albumin ELP 12/20/2020 3.2  2.9 - 4.4 g/dL Final  . Alpha-1-Globulin 12/20/2020 0.3  0.0 - 0.4 g/dL Final  . Alpha-2-Globulin 12/20/2020 0.9  0.4 - 1.0 g/dL Final  . Beta Globulin 12/20/2020 0.8  0.7 - 1.3 g/dL Final  . Gamma Globulin 12/20/2020 0.8  0.4 - 1.8 g/dL Final  . M-Spike, % 12/20/2020 0.3* Not Observed g/dL Final  . SPE Interp. 12/20/2020 Comment   Final   Comment: (NOTE) The SPE pattern demonstrates a single peak (M-spike) in the gamma region which may represent monoclonal protein. This peak may also be caused by circulating immune complexes, cryoglobulins, C-reactive protein, fibrinogen or hemolysis.  If clinically indicated, the presence of a monoclonal gammopathy may be confirmed by immuno- fixation, as well as an evaluation of the urine for the presence of Bence-Jones protein. Performed At: [facility] [place], [place] [id number] Rush Farmer MD BS:[id number]   . Comment 12/20/2020 Comment   Final   Comment: (NOTE) Protein electrophoresis scan will follow via computer, mail, or courier delivery.   . Globulin, Total 12/20/2020 2.9  2.2 - 3.9 g/dL Corrected  . A/G Ratio 12/20/2020 1.1  0.7 - 1.7 Corrected  . Sodium 12/20/2020 135  135 - 145 mmol/L Final  . Potassium 12/20/2020 3.3* 3.5 - 5.1 mmol/L Final  . Chloride 12/20/2020 95* 98 - 111 mmol/L Final  . CO2 12/20/2020 28  22 - 32  mmol/L Final  . Glucose, Bld 12/20/2020 194* 70 - 99 mg/dL Final   Glucose reference range applies only to samples taken after fasting for at least 8 hours.  . BUN 12/20/2020 23  8 - 23 mg/dL Final  . Creatinine, Ser 12/20/2020 1.38* 0.61 - 1.24 mg/dL Final  . Calcium 12/20/2020 9.4  8.9 - 10.3 mg/dL Final  . Total Protein 12/20/2020 6.5  6.5 - 8.1 g/dL Final  . Albumin 12/20/2020 3.2* 3.5 - 5.0 g/dL Final  . AST 12/20/2020 22  15 - 41 U/L Final  . ALT 12/20/2020 18  0 - 44 U/L Final  . Alkaline Phosphatase 12/20/2020 97  38 - 126 U/L Final  . Total Bilirubin 12/20/2020 1.1  0.3 - 1.2 mg/dL Final  . GFR, Estimated 12/20/2020 50* >60 mL/min Final   Comment: (NOTE) Calculated using the CKD-EPI Creatinine Equation (2021)   . Anion gap 12/20/2020 12  5 - 15 Final   Performed at [facility], [address] [place], [place] [address]  . WBC 12/20/2020 7.9  4.0 - 10.5 K/uL Final  . RBC 12/20/2020 3.32* 4.22 - 5.81 MIL/uL Final  . Hemoglobin 12/20/2020 9.7* 13.0 - 17.0 g/dL Final  . HCT 12/20/2020 29.7* 39.0 - 52.0 % Final  . MCV 12/20/2020 89.5  80.0 - 100.0 fL Final  . MCH 12/20/2020 29.2  26.0 - 34.0 pg Final  . MCHC 12/20/2020 32.7  30.0 - 36.0 g/dL Final  . RDW 12/20/2020 13.8  11.5 - 15.5 % Final  . Platelets 12/20/2020 202  150 - 400 K/uL Final  . nRBC 12/20/2020 0.0  0.0 - 0.2 % Final  . Neutrophils Relative % 12/20/2020 78  % Final  . Neutro Abs 12/20/2020 6.3  1.7 - 7.7 K/uL Final  . Lymphocytes Relative 12/20/2020 11  % Final  . Lymphs Abs 12/20/2020 0.8  0.7 - 4.0 K/uL Final  . Monocytes Relative 12/20/2020 8  % Final  . Monocytes Absolute 12/20/2020 0.6  0.1 - 1.0 K/uL Final  . Eosinophils Relative 12/20/2020 2  % Final  . Eosinophils Absolute 12/20/2020 0.2  0.0 - 0.5 K/uL Final  . Basophils Relative 12/20/2020 0  % Final  . Basophils Absolute 12/20/2020 0.0  0.0 - 0.1 K/uL Final  . Immature Granulocytes 12/20/2020 1  % Final  . Abs Immature Granulocytes  12/20/2020 0.04  0.00 - 0.07 K/uL Final   Performed at [facility], [address], [place], [place] [address]    Assessment:  Claud Gowan Sr. is a 87 y.o. male with metastatic prostate cancer. He presented with gross hematuria. PSAwas 2241 on 11/06/2019.   Renal stone CTon 11/05/2019 revealed confluentnodular massesadjacent to the prostate and distal sigmoid colon with left pelvic, perirectal, retroperitoneal, and mesenteric adenopathy as well as probable peritoneal diseasemost marked in the right upper quadrant extending about the right diaphragmatic leaflet. There was pelvic and suspected vertebral osseous metastatic diseasewith L1 compression fracture, likely pathologic.There was a possible 2 cm right lower lobe lung nodule.There was left hydronephrosisand proximal ureterectasis secondary to pelvic process (prior renal ultrasound on 10/16/2019 revealed no hydronephrosis).  Chest CTon 11/08/2019 revealed right pleural effusion with pleural metastasis(2.7 x 2.1 cm nodularity along anterior right hemidiaphragm). Osseous metastasis, including a dominant lesioninvolving the right-side of the L2vertebral body.   Bone scanon 11/08/2019 revealed widespread bony metastasis(right occiput, clavicles, left upper extremity, sternum, ribs bilaterally, throughout the cervical, thoracic, lumbar spine, bony pelvis, proximal femurs, right tibia). Patient declined biopsyfor NGS.  He received degarelixon 11/11/2019(last 06/28/2020). He began abirateroneon 11/14/2019.  Bone scan on 07/11/2020 revealed interval response to therapy with the resolution or varying degrees of diminishing uptake within widespread skeletal metastases noted on prior examination. Left hydronephrosis resolved. There was increased focal uptake within the medial ankles bilaterally, left greater than right, suggestive of interval trauma since prior examination. Correlation with plain film  examination may be helpful for further evaluation.  Chest, abdomen and pelvis CT without contrast on 07/18/2020 revealed significant interval reduction in previously seen bulky lymphadenopathy throughout the pelvis and retroperitoneum, c/w treatment response. There was interval increase in extensive sclerotic osseous metastatic disease throughout the included skeleton, likely reflecting post treatment change of existing osseous metastatic disease. Multiple subtle wedge and endplate deformities were unchanged, most notably a wedge deformity of L1 with approximately 50% height loss. There was prostatomegaly. There was coronary artery disease and aortic atherosclerosis.  PSAhas been followed: 32.82 on 12/22/2018, 33.08 on 12/30/2018, 122.41 on 05/27/2019, 2241.0 on 11/06/2019, 216 on 12/09/2019, 80.45 01/06/2020, 54.06 on 02/06/2020, 37.73 on 03/07/2020, 30.57 on 04/09/2020, 35.25 on 05/02/2020, 45.03 on 05/30/2020, 76.52 on 06/28/2020, 104.03 on 08/01/2020, 117.35 on 09/03/2020, 244.0 on 10/11/2020, 467.0 on 11/14/2020, and 1006 on 12/20/2020.  Testosterone < 3 on 11/14/2020.   He has a monoclonal gammopathy of unknown significance (MGUS). SPEPon 10/16/2019 revealed a 0.4 gm/dL + an additional 0.2 gm/dL biclonal IgAprotein with kappa specificity. Bone marrowon 11/14/2019 revealed a hypercellular bone marrow for age with metastatic carcinomac/w  prostate cancer. There was slight plasmacytosis (4% plasma cells). Plasma cells displayed polyclonal staining for kappa and lambda light chains with kappa light chain excess. Findings were most suggestive of early involvement by a plasma cell neoplasm. Cytogenetics were normal (54, XX). 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  SPEP has been followed (gm/dL):  0.3 on 10/16/2019, 0.3 on 03/07/2020, 0.2 on 08/01/2020, and 0.3 on 12/20/2020.  Kappa free light chains 36.8 (ratio 1.450 on 12/20/2020.  He has a history of DVTin the upper deep calf vein in  the region of the venous trifurcation on 07/04/2019.He was on Eliquis,but discontinued secondary to hematuria.IVC filter was placed on 11/07/2019.  He is hepatitis C positive.  Testing occurred on 10/17/2019.  Hepatitis C quantitative was not detected.  Given his age and prostate cancer, treatment for hepatitis C was not recommended.  He has stage IIIa chronic kidney disease. Creatinine is 1.4.  He has received both doses of the COVID-19 vaccine on 11/03/2019.   Symptomatically, ***  Plan: 1.   Labs today: CBC with diff, CMP, ferritin, iron studies, PSA.   2. Metastatic prostatic prostate cancer Symptomatically, he denies any complaints Hepresented with extensive pelvic, perirectal, retroperitoneal and mesenteric adenopathy. Hepresented with extensive osseous metastasis. He began degarelix and abiraterone + prednisone on 11/11/2019.     He has been receiving [facility] at [facility].   He continues to deny any side effects associated with abiraterone.     He feels that he is taking abiraterone with prednisone.  Bone scan on 07/11/2020 and CT scans on 07/18/2020 revealed a significant response to treatment.  Discuss need for restaging studies given upward trend in PSA.  Continue degarelix monthly.    Continue abiraterone at current time unless future imaging documents progression.   Consider Omniseq testing and Invitae testing.   Given performance status, patient not a candidate for chemotherapy. Labs reviewed.  Degarelix today. Reschedule bone scan and CT scans.     Discuss symptom management.  Interventions are adequate.    3. Bone metastasis Patient with known bone metastasis.  [name] remains pending secondary to dental clearance. 4.Normocytic anemia  Hematocrit 32.8.  Hemoglobin 10.7.  MCV 89.1 on 10/11/2020.  Hematocrit 30.4.  Hemoglobin   9.9.  MCV 88.1 on  11/14/2020.  Ferritin 288 with iron saturation 24% and TIBC 230 on 10/11/2020.  B12 494 and folate 28.0 (normal) on 10/11/2020.  Etiology may be secondary to chronic disease or prostate cancer. 5. Monoclonal gammopathyof unknown significance (MGUS) SPEP on 10/16/2019 revealed a 0.4 gm/dL + an additional 0.2 gm/dL biclonal IgA protein with kappa specificity. Bone marrow aspirate and biopsy on 11/15/2019 revealed a hypercellular bone marrow with metastatic carcinoma c/w prostate cancer.  There was slight plasmacytosis (4% plasma cells). Plasma cells werepolyclonal. SPEP was 0.3 gm/dL on 03/07/2020 and 0.2 gm/dL on 08/01/2020.  Check SPEP at next blood draw. 6. Left lower extremity DVT Duplex on 07/04/2019 revealedupper deep calf vein in the region of the venous trifurcation. IVC filterwas placed on 11/07/2019. He remains off anticoagulation secondary to hematuria.  Continue to monitor. 7.   Degarelix today. 8.   Reschedule CT and bone scan. 9.   RTC in 1 month for MD assessment, labs (CBC with diff, CMP, SPEP, FLCA, PSA), review of imaging, and degarelix.  I discussed the assessment and treatment plan with the patient.  The patient was provided an opportunity to ask questions and all were answered.  The patient agreed with the plan and demonstrated an understanding  of the instructions.  The patient was advised to call back if the symptoms worsen or if the condition fails to improve as anticipated.  I provided *** minutes of face-to-face time during this this encounter and > 50% was spent counseling as documented under my assessment and plan.  Lequita Asal, MD, PhD    01/17/2021, 2:55 PM  Carleene Cooper, am acting as Education administrator for [employer]. Mike Gip, MD, PhD.  I, Melissa C. Mike Gip, MD, have reviewed the above documentation for accuracy and  completeness, and I agree with the above.

## 2021-01-18 DEATH — deceased

## 2021-01-19 DIAGNOSIS — C7951 Secondary malignant neoplasm of bone: Secondary | ICD-10-CM | POA: Insufficient documentation

## 2021-01-21 ENCOUNTER — Ambulatory Visit: Payer: Medicare Other

## 2021-01-21 ENCOUNTER — Ambulatory Visit: Payer: Medicare Other | Admitting: Hematology and Oncology

## 2021-01-21 DIAGNOSIS — C61 Malignant neoplasm of prostate: Secondary | ICD-10-CM

## 2021-01-21 DIAGNOSIS — D472 Monoclonal gammopathy: Secondary | ICD-10-CM

## 2021-01-21 DIAGNOSIS — D649 Anemia, unspecified: Secondary | ICD-10-CM

## 2021-01-21 DIAGNOSIS — C7951 Secondary malignant neoplasm of bone: Secondary | ICD-10-CM

## 2021-01-23 ENCOUNTER — Telehealth: Payer: Self-pay | Admitting: *Deleted

## 2021-01-23 NOTE — Telephone Encounter (Signed)
VERBAL ORDER called to [facility] for Dr C to be attending

## 2021-01-23 NOTE — Telephone Encounter (Addendum)
Patient discharging from SNF to home with referral for hospice services and Hospice is asking if Dr Mike Gip will sign to be attending doctor as patient has not seen his PCP in quite a while. Please advise

## 2021-01-23 NOTE — Telephone Encounter (Signed)
  I am fine to be the attending.  M

## 2021-01-24 ENCOUNTER — Telehealth: Payer: Self-pay

## 2021-01-24 NOTE — Telephone Encounter (Signed)
-----   Message from Lequita Asal, MD sent at 01/24/2021 11:58 AM EDT ----- Regarding: RE: Hospice Order  Yes.  I agree that his life expectancy is < 6 months.  Yes, we can FAX a Hospice consult.  Let me know if you need anything else.  Dr Nolon Stalls   ----- Message ----- From: Floydene Flock Sent: 01/24/2021  10:07 AM EDT To: Lequita Asal, MD Subject: Hospice Order                                  We greatly appreciate you attending for this patient's hospice care. I have a couple of quick questions, if you don't mind, so we can complete the referral process and schedule this patient to be seen. Do you agree that this patient this patient has a life expectancy of six (6) months or less if the illness runs its natural course? And will you fax an Hospice Consultation order over to our office at [phone].  Thank you very much and have a great day!  [employer] Referral Intake Specialist

## 2021-01-24 NOTE — Telephone Encounter (Signed)
Hospice referral has been faxed to [place].

## 2021-01-29 NOTE — Progress Notes (Incomplete)
[facility]  [address] [place], [place] [address] Phone: [phone]  Fax: [phone]   Clinic Day:  01/29/2021  Referring physician: Lesia Hausen, PA  Chief Complaint: Brandon Busing Sr. is a 87 y.o. male with metastatic prostate cancer on abiraterone and stage III chronic kidney disease who is seen for 1 month assessment, review of interval imaging, and monthly Degarelix.  HPI: The patient was last seen in the medical oncology clinic on 12/20/2020. At that time, he felt alright. He recently had 3 falls. He denied bone pain. He required assistance with ADLs. Exam was stable. Hematocrit was 29.7, hemoglobin 9.7, MCV 89.5, platelets 202,000, WBC 7,900. Potassium was 3.3.Creatinine was 1.38 (CrCl 50 ml/min). Albumin was 3.2. PSA was 1,006.00. M spike was 0.3 gm/dL. Kappa free light chains were 38.8, lambda free light chains 26.8, ratio 1.45. We discussed his progressive disease and potential plans for future therapy.  He was felt not to be a candidate for chemotherapy.  He received Degarelix.  The patient went to [facility] after his appointment for progressive weakness thought to be due to a UTI. He was admitted to [facility] from 12/20/2020 - 12/26/2020. UA on presentation with pyuria and hematuria, no nitrites. He improved on ceftriaxone, which was then transitioned to Augmentin. He was discharged to [facility] in [place].  I spoke to his daughter, [name], on 12/27/2020.  We discussed my conversations with radiology as well as pathology.  Lymph node biopsy would be difficult given the proximity to blood vessels. Bone biopsy would be problematic for sending for [facility].   We discussed the possibility of a prostate biopsy. We discussed NGS liquid biopsy.  She stated that she would discuss these options with her father to determine direction of therapy.    The patient was seen in the [facility] ER on 12/29/2020 after a fall. Work-up was  negative.  Chest CT angiogram with contrast revealed left hilar, para esophageal, and right inferior cardiophrenic angle lymphadenopathy measuring up to 1.6 cm; indeterminate. Follow-up CT in 3 months was recommended. A lip laceration was repaired. Potassium was 2.8.  He was prescribed a 10 day course of oral potassium.  Labs on 01/14/2021 revealed a hematocrit of 29.6, hemoglobin 10.1, MCV 84.3, platelets 333,000, WBC 6400 with an ANC of 4600.  During the interim, ***   Past Medical History:  Diagnosis Date  . Anemia, normocytic normochromic   . BPH (benign prostatic hyperplasia)   . CAD (coronary artery disease)   . Cataracts, bilateral   . Chronic renal insufficiency   . Colon polyp   . COPD (chronic obstructive pulmonary disease) ([place])   . Diabetes mellitus without complication ([place])   . DVT (deep venous thrombosis) ([place])   . Edema of both legs   . Glaucoma   . Gout   . Heart murmur   . Hepatitis C antibody test positive 10/20/2019  . Hyperlipidemia   . Hypertension   . Moderate persistent asthma without complication   . Myocardial infarction ([place])   . Neuromuscular disorder ([place])   . Neuropathy   . Peripheral vascular disease ([place])   . Premature ejaculation   . Prostate cancer ([place])   . RLS (restless legs syndrome)   . Sleep apnea   . Stented coronary artery 11/13/2015  . Uncontrolled type 2 diabetes mellitus with stage 3 chronic kidney disease, with long-term current use of insulin ([place])     Past Surgical History:  Procedure Laterality Date  .  APPENDECTOMY    . [place]  2009  . COLONOSCOPY  2012  . EYE SURGERY    . IVC FILTER INSERTION N/A 11/07/2019   Procedure: IVC FILTER INSERTION;  Surgeon: Algernon Huxley, MD;  Location: [facility];  Service: Cardiovascular;  Laterality: N/A;  . PROSTATE SURGERY    . TONSILLECTOMY      No family history on file.  Social History:  reports that he has quit smoking. He has never used smokeless tobacco.  No history on file for alcohol use and drug use. He previously smoked <1 pack/day x 20 years (stopped smoking with cardiac stent placement). He previously drank liquor on the weekend (stopped 2020). He is retired Dance movement psychotherapist). He previously lived outside of [place]. He then moved to [place] then [place] to live with his daughter [name] [phone]). His wife died of cancer 1 year ago.He is currently living at [facility] assisted living. The patient is alone*** today.  Allergies:  Allergies  Allergen Reactions  . Metformin And Related Diarrhea    Intolerance due to naturally loose bowels    Current Medications: Current Outpatient Medications  Medication Sig Dispense Refill  . abiraterone acetate (ZYTIGA) 250 MG tablet TAKE 4 TABLETS BY MOUTH DAILY ON AN EMPTY STOMACH 1 HOUR BEFORE OR 2 HOURS AFTER MEALS 120 tablet 1  . amLODipine (NORVASC) 10 MG tablet Take 10 mg by mouth daily.    Marland Kitchen atorvastatin (LIPITOR) 80 MG tablet Take 80 mg by mouth daily.    . carvedilol (COREG) 25 MG tablet Take 25 mg by mouth 2 (two) times daily with a meal.    . cephALEXin (KEFLEX) 500 MG capsule Take 1 capsule (500 mg total) by mouth 2 (two) times daily. (Patient not taking: Reported on 12/20/2020) 14 capsule 0  . Cholecalciferol (VITAMIN D) 50 MCG (2000 UT) tablet Take 2,000 Units by mouth daily.     . DULoxetine (CYMBALTA) 60 MG capsule Take 60 mg by mouth daily.    . famotidine (PEPCID) 20 MG tablet Take 20 mg by mouth at bedtime as needed for heartburn.     . finasteride (PROSCAR) 5 MG tablet Take 5 mg by mouth daily.    . furosemide (LASIX) 20 MG tablet Take 20 mg by mouth.    . gabapentin (NEURONTIN) 100 MG capsule Take 1 capsule (100 mg total) by mouth 2 (two) times daily. (Patient taking differently: Take 100 mg by mouth at bedtime.) 60 capsule 0  . insulin aspart (NOVOLOG) 100 UNIT/ML injection Inject 5 Units into the skin 3 (three) times daily with meals. (Patient  taking differently: Inject 12 Units into the skin 3 (three) times daily with meals. 12 units breakfast and lunch and then 6 units at dinner) 10 mL 11  . Insulin Detemir (LEVEMIR) 100 UNIT/ML Pen Inject 15 Units into the skin daily. (Patient taking differently: Inject 25 Units into the skin daily.) 20 mL 0  . melatonin 5 MG TABS Take 5 mg by mouth at bedtime.    . mirabegron ER (MYRBETRIQ) 25 MG TB24 tablet Take 25 mg by mouth daily.    . Multiple Vitamin (MULTI-VITAMIN) tablet Take 1 tablet by mouth daily.    . nitroGLYCERIN (NITROSTAT) 0.4 MG SL tablet Place 0.4 mg under the tongue every 5 (five) minutes as needed for chest pain.    . polyethylene glycol (MIRALAX / GLYCOLAX) 17 g packet Take 17 g by mouth daily. (Patient not taking: Reported on 12/20/2020)  14 each 0  . predniSONE (DELTASONE) 5 MG tablet TAKE 1 TABLET BY MOUTH DAILY WITH BREAKFAST. BEGIN TAKING WITH INITIATION OF ABIRATERONE. 30 tablet 0  . senna-docusate (SENOKOT-S) 8.6-50 MG tablet Take 1 tablet by mouth 2 (two) times daily. 10 tablet 0  . Skin Protectants, Misc. (EUCERIN) cream Apply 1 application topically daily.    . traZODone (DESYREL) 50 MG tablet Take 50 mg by mouth at bedtime.    Marland Kitchen UNIFINE SAFECONTROL PEN NEEDLE 30G X 5 MM MISC      No current facility-administered medications for this visit.   Review of Systems  Constitutional: Negative for chills, diaphoresis, fever, malaise/fatigue and weight loss (up 1 lb since 08/2020).       Feels "alright".  HENT: Negative for congestion, ear discharge, ear pain, hearing loss, nosebleeds, sinus pain, sore throat and tinnitus.   Eyes: Negative for blurred vision and double vision.  Respiratory: Negative for cough, hemoptysis, sputum production and shortness of breath.   Cardiovascular: Positive for leg swelling (chronic). Negative for chest pain and palpitations.  Gastrointestinal: Negative for abdominal pain, blood in stool, constipation, diarrhea, heartburn, melena, nausea and  vomiting.       Poor diet.  Genitourinary: Negative for dysuria, frequency, hematuria and urgency.  Musculoskeletal: Positive for falls (3 x on Saturday) and joint pain (right hip s/p fall). Negative for back pain, myalgias and neck pain.       Right hand swellings s/p fall  Skin: Negative for itching and rash.       Left forearm scraped s/p fall  Neurological: Negative for dizziness, tingling, sensory change, weakness and headaches.  Endo/Heme/Allergies: Does not bruise/bleed easily.       Diabetes; up and down  Psychiatric/Behavioral: Negative for depression and memory loss. The patient is not nervous/anxious and does not have insomnia.   All other systems reviewed and are negative.  Performance status (ECOG): 3***  Vitals There were no vitals taken for this visit.   Physical Exam Vitals and nursing note reviewed.  Constitutional:      General: He is not in acute distress.    Appearance: He is not diaphoretic.     Comments: Patient examined in a wheelchair.  HENT:     Head: Normocephalic and atraumatic.     Mouth/Throat:     Mouth: Mucous membranes are moist.     Pharynx: Oropharynx is clear.  Eyes:     General: No scleral icterus.    Extraocular Movements: Extraocular movements intact.     Conjunctiva/sclera: Conjunctivae normal.     Pupils: Pupils are equal, round, and reactive to light.     Comments: Brown eyes.  Cardiovascular:     Rate and Rhythm: Normal rate and regular rhythm.     Heart sounds: Normal heart sounds. No murmur heard.   Pulmonary:     Effort: Pulmonary effort is normal. No respiratory distress.     Breath sounds: Normal breath sounds. No wheezing or rales.  Chest:     Chest wall: No tenderness.  Breasts:     Right: No axillary adenopathy or supraclavicular adenopathy.     Left: No axillary adenopathy or supraclavicular adenopathy.    Abdominal:     General: Bowel sounds are normal. There is no distension.     Palpations: Abdomen is soft.  There is no mass.     Tenderness: There is no abdominal tenderness. There is no guarding or rebound.  Musculoskeletal:        General:  Swelling (right hand s/p fall) present. No tenderness. Normal range of motion.     Cervical back: Normal range of motion and neck supple.     Right lower leg: Edema (chronic) present.     Left lower leg: Edema (chronic) present.  Lymphadenopathy:     Head:     Right side of head: No preauricular, posterior auricular or occipital adenopathy.     Left side of head: No preauricular, posterior auricular or occipital adenopathy.     Cervical: No cervical adenopathy.     Upper Body:     Right upper body: No supraclavicular or axillary adenopathy.     Left upper body: No supraclavicular or axillary adenopathy.     Lower Body: No right inguinal adenopathy. No left inguinal adenopathy.  Skin:    General: Skin is warm and dry.     Comments: Scratches on left forearm and wrist s/p fall.  Neurological:     Mental Status: He is alert and oriented to person, place, and time.  Psychiatric:        Behavior: Behavior normal.        Thought Content: Thought content normal.        Judgment: Judgment normal.    Imaging studies: 11/05/2019:  Renal stone CTrevealed confluentnodular massesadjacent to the prostate and distal sigmoid colon with left pelvic, perirectal, retroperitoneal, and mesenteric adenopathy as well as probable peritoneal diseasemost marked in the right upper quadrant extending about the right diaphragmatic leaflet. There was pelvic and suspected vertebral osseous metastatic diseasewith L1 compression fracture, likely pathologic.There was a possible 2 cm right lower lobe lung nodule.There was left hydronephrosisand proximal ureterectasis secondary to pelvic process (prior renal ultrasound on 10/16/2019 revealed no hydronephrosis). 11/08/2019:  Chest CTrevealed right pleural effusion with pleural metastasis(2.7 x 2.1 cm nodularity along anterior  right hemidiaphragm). Osseous metastasis, including a dominant lesioninvolving the right-side of the L2vertebral body.  11/08/2019:  Bone scanrevealed widespread bony metastasis(right occiput, clavicles, left upper extremity, sternum, ribs bilaterally, throughout the cervical, thoracic, lumbar spine, bony pelvis, proximal femurs, right tibia).  07/11/2020:  Bone scan revealed interval response to therapy with the resolution or varying degrees of diminishing uptake within widespread skeletal metastases noted on prior examination. Left hydronephrosis resolved. There was increased focal uptake within the medial ankles bilaterally, left greater than right, suggestive of interval trauma since prior examination. Correlation with plain film examination may be helpful for further evaluation. 07/18/2020:  Chest, abdomen and pelvis CT without contrast revealed significant interval reduction in previously seen bulky lymphadenopathy throughout the pelvis and retroperitoneum, c/w treatment response. There was interval increase in extensive sclerotic osseous metastatic disease throughout the included skeleton, likely reflecting post treatment change of existing osseous metastatic disease. Multiple subtle wedge and endplate deformities were unchanged, most notably a wedge deformity of L1 with approximately 50% height loss. There was prostatomegaly. There was coronary artery disease and aortic atherosclerosis.   No visits with results within 3 Day(s) from this visit.  Latest known visit with results is:  Appointment on 12/20/2020  Component Date Value Ref Range Status  . Prostatic Specific Antigen 12/20/2020 1,006.00* 0.00 - 4.00 ng/mL Final   Comment: (NOTE) While PSA levels of <=4.0 ng/ml are reported as reference range, some men with levels below 4.0 ng/ml can have prostate cancer and many men with PSA above 4.0 ng/ml do not have prostate cancer.  Other tests such as free PSA, age specific reference ranges,  PSA velocity and PSA doubling time may be helpful  especially in men less than 63 years old. Performed at [facility], [place] [address] [place], [place] [address]   . Kappa free light chain 12/20/2020 38.8* 3.3 - 19.4 mg/L Final  . Lamda free light chains 12/20/2020 26.8* 5.7 - 26.3 mg/L Final  . Kappa, lamda light chain ratio 12/20/2020 1.45  0.26 - 1.65 Final   Comment: (NOTE) Performed At: [facility] [place], [place] [id number] Rush Farmer MD [id number]   . Total Protein ELP 12/20/2020 6.1  6.0 - 8.5 g/dL Final  . Albumin ELP 12/20/2020 3.2  2.9 - 4.4 g/dL Final  . Alpha-1-Globulin 12/20/2020 0.3  0.0 - 0.4 g/dL Final  . Alpha-2-Globulin 12/20/2020 0.9  0.4 - 1.0 g/dL Final  . Beta Globulin 12/20/2020 0.8  0.7 - 1.3 g/dL Final  . Gamma Globulin 12/20/2020 0.8  0.4 - 1.8 g/dL Final  . M-Spike, % 12/20/2020 0.3* Not Observed g/dL Final  . SPE Interp. 12/20/2020 Comment   Final   Comment: (NOTE) The SPE pattern demonstrates a single peak (M-spike) in the gamma region which may represent monoclonal protein. This peak may also be caused by circulating immune complexes, cryoglobulins, C-reactive protein, fibrinogen or hemolysis.  If clinically indicated, the presence of a monoclonal gammopathy may be confirmed by immuno- fixation, as well as an evaluation of the urine for the presence of Bence-Jones protein. Performed At: [facility] [place], [place] [id number] Rush Farmer MD [id number]   . Comment 12/20/2020 Comment   Final   Comment: (NOTE) Protein electrophoresis scan will follow via computer, mail, or courier delivery.   . Globulin, Total 12/20/2020 2.9  2.2 - 3.9 g/dL Corrected  . A/G Ratio 12/20/2020 1.1  0.7 - 1.7 Corrected  . Sodium 12/20/2020 135  135 - 145 mmol/L Final  . Potassium 12/20/2020 3.3* 3.5 - 5.1 mmol/L Final  . Chloride 12/20/2020 95* 98 - 111 mmol/L Final  . CO2 12/20/2020 28  22 - 32  mmol/L Final  . Glucose, Bld 12/20/2020 194* 70 - 99 mg/dL Final   Glucose reference range applies only to samples taken after fasting for at least 8 hours.  . BUN 12/20/2020 23  8 - 23 mg/dL Final  . Creatinine, Ser 12/20/2020 1.38* 0.61 - 1.24 mg/dL Final  . Calcium 12/20/2020 9.4  8.9 - 10.3 mg/dL Final  . Total Protein 12/20/2020 6.5  6.5 - 8.1 g/dL Final  . Albumin 12/20/2020 3.2* 3.5 - 5.0 g/dL Final  . AST 12/20/2020 22  15 - 41 U/L Final  . ALT 12/20/2020 18  0 - 44 U/L Final  . Alkaline Phosphatase 12/20/2020 97  38 - 126 U/L Final  . Total Bilirubin 12/20/2020 1.1  0.3 - 1.2 mg/dL Final  . GFR, Estimated 12/20/2020 50* >60 mL/min Final   Comment: (NOTE) Calculated using the CKD-EPI Creatinine Equation (2021)   . Anion gap 12/20/2020 12  5 - 15 Final   Performed at [facility], [address], [place], [place] [address]  . WBC 12/20/2020 7.9  4.0 - 10.5 K/uL Final  . RBC 12/20/2020 3.32* 4.22 - 5.81 MIL/uL Final  . Hemoglobin 12/20/2020 9.7* 13.0 - 17.0 g/dL Final  . HCT 12/20/2020 29.7* 39.0 - 52.0 % Final  . MCV 12/20/2020 89.5  80.0 - 100.0 fL Final  . MCH 12/20/2020 29.2  26.0 - 34.0 pg Final  . MCHC 12/20/2020 32.7  30.0 - 36.0 g/dL Final  . RDW 12/20/2020 13.8  11.5 - 15.5 % Final  . Platelets 12/20/2020 202  150 - 400 K/uL Final  . nRBC 12/20/2020 0.0  0.0 - 0.2 % Final  . Neutrophils Relative % 12/20/2020 78  % Final  . Neutro Abs 12/20/2020 6.3  1.7 - 7.7 K/uL Final  . Lymphocytes Relative 12/20/2020 11  % Final  . Lymphs Abs 12/20/2020 0.8  0.7 - 4.0 K/uL Final  . Monocytes Relative 12/20/2020 8  % Final  . Monocytes Absolute 12/20/2020 0.6  0.1 - 1.0 K/uL Final  . Eosinophils Relative 12/20/2020 2  % Final  . Eosinophils Absolute 12/20/2020 0.2  0.0 - 0.5 K/uL Final  . Basophils Relative 12/20/2020 0  % Final  . Basophils Absolute 12/20/2020 0.0  0.0 - 0.1 K/uL Final  . Immature Granulocytes 12/20/2020 1  % Final  . Abs Immature Granulocytes  12/20/2020 0.04  0.00 - 0.07 K/uL Final   Performed at [facility], [address] [place], [place] [address]    Assessment:  Brandon Durfee Sr. is a 87 y.o. male with metastatic prostate cancer. He presented with gross hematuria. PSAwas 2241 on 11/06/2019.   Renal stone CTon 11/05/2019 revealed confluentnodular massesadjacent to the prostate and distal sigmoid colon with left pelvic, perirectal, retroperitoneal, and mesenteric adenopathy as well as probable peritoneal diseasemost marked in the right upper quadrant extending about the right diaphragmatic leaflet. There was pelvic and suspected vertebral osseous metastatic diseasewith L1 compression fracture, likely pathologic.There was a possible 2 cm right lower lobe lung nodule.There was left hydronephrosisand proximal ureterectasis secondary to pelvic process (prior renal ultrasound on 10/16/2019 revealed no hydronephrosis).  Chest CTon 11/08/2019 revealed right pleural effusion with pleural metastasis(2.7 x 2.1 cm nodularity along anterior right hemidiaphragm). Osseous metastasis, including a dominant lesioninvolving the right-side of the L2vertebral body.   Bone scanon 11/08/2019 revealed widespread bony metastasis(right occiput, clavicles, left upper extremity, sternum, ribs bilaterally, throughout the cervical, thoracic, lumbar spine, bony pelvis, proximal femurs, right tibia). Patient declined biopsyfor NGS.  He received degarelixon 11/11/2019(last 06/28/2020). He began abirateroneon 11/14/2019.  Bone scan on 07/11/2020 revealed interval response to therapy with the resolution or varying degrees of diminishing uptake within widespread skeletal metastases noted on prior examination. Left hydronephrosis resolved. There was increased focal uptake within the medial ankles bilaterally, left greater than right, suggestive of interval trauma since prior examination. Correlation with plain film  examination may be helpful for further evaluation.  Chest, abdomen and pelvis CT without contrast on 07/18/2020 revealed significant interval reduction in previously seen bulky lymphadenopathy throughout the pelvis and retroperitoneum, c/w treatment response. There was interval increase in extensive sclerotic osseous metastatic disease throughout the included skeleton, likely reflecting post treatment change of existing osseous metastatic disease. Multiple subtle wedge and endplate deformities were unchanged, most notably a wedge deformity of L1 with approximately 50% height loss. There was prostatomegaly. There was coronary artery disease and aortic atherosclerosis.  PSAhas been followed: 32.82 on 12/22/2018, 33.08 on 12/30/2018, 122.41 on 05/27/2019, 2241.0 on 11/06/2019, 216 on 12/09/2019, 80.45 01/06/2020, 54.06 on 02/06/2020, 37.73 on 03/07/2020, 30.57 on 04/09/2020, 35.25 on 05/02/2020, 45.03 on 05/30/2020, 76.52 on 06/28/2020, 104.03 on 08/01/2020, 117.35 on 09/03/2020, 244.0 on 10/11/2020, 467.0 on 11/14/2020, and 1006 on 12/20/2020.  Testosterone < 3 on 11/14/2020.   He has a monoclonal gammopathy of unknown significance (MGUS). SPEPon 10/16/2019 revealed a 0.4 gm/dL + an additional 0.2 gm/dL biclonal IgAprotein with kappa specificity. Bone marrowon 11/14/2019 revealed a hypercellular bone marrow for age with metastatic carcinomac/w  prostate cancer. There was slight plasmacytosis (4% plasma cells). Plasma cells displayed polyclonal staining for kappa and lambda light chains with kappa light chain excess. Findings were most suggestive of early involvement by a plasma cell neoplasm. Cytogenetics were normal (19, XX). 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  SPEP has been followed (gm/dL):  0.3 on 10/16/2019, 0.3 on 03/07/2020, 0.2 on 08/01/2020, and 0.3 on 12/20/2020.  Kappa free light chains 36.8 (ratio 1.450 on 12/20/2020.  He has a history of DVTin the upper deep calf vein in  the region of the venous trifurcation on 07/04/2019.He was on Eliquis,but discontinued secondary to hematuria.IVC filter was placed on 11/07/2019.  He is hepatitis C positive.  Testing occurred on 10/17/2019.  Hepatitis C quantitative was not detected.  Given his age and prostate cancer, treatment for hepatitis C was not recommended.  He has stage IIIa chronic kidney disease. Creatinine is 1.4.  He has received both doses of the COVID-19 vaccine on 11/03/2019.   Symptomatically, ***  Plan: 1.   Labs today: CBC with diff, CMP, ferritin, iron studies, PSA.   2. Metastatic prostatic prostate cancer Symptomatically, he denies any complaints Hepresented with extensive pelvic, perirectal, retroperitoneal and mesenteric adenopathy. Hepresented with extensive osseous metastasis. He began degarelix and abiraterone + prednisone on 11/11/2019.     He has been receiving abiraterone at [facility].   He continues to deny any side effects associated with abiraterone.     He feels that he is taking abiraterone with prednisone.  Bone scan on 07/11/2020 and CT scans on 07/18/2020 revealed a significant response to treatment.  Discuss need for restaging studies given upward trend in PSA.  Continue degarelix monthly.    Continue abiraterone at current time unless future imaging documents progression.   Consider Omniseq testing and Invitae testing.   Given performance status, patient not a candidate for chemotherapy. Labs reviewed.  Degarelix today. Reschedule bone scan and CT scans.     Discuss symptom management.  Interventions are adequate.    3. Bone metastasis Patient with known bone metastasis.  Delton See remains pending secondary to dental clearance. 4.Normocytic anemia  Hematocrit 32.8.  Hemoglobin 10.7.  MCV 89.1 on 10/11/2020.  Hematocrit 30.4.  Hemoglobin   9.9.  MCV 88.1 on  11/14/2020.  Ferritin 288 with iron saturation 24% and TIBC 230 on 10/11/2020.  B12 494 and folate 28.0 (normal) on 10/11/2020.  Etiology may be secondary to chronic disease or prostate cancer. 5. Monoclonal gammopathyof unknown significance (MGUS) SPEP on 10/16/2019 revealed a 0.4 gm/dL + an additional 0.2 gm/dL biclonal IgA protein with kappa specificity. Bone marrow aspirate and biopsy on 11/15/2019 revealed a hypercellular bone marrow with metastatic carcinoma c/w prostate cancer.  There was slight plasmacytosis (4% plasma cells). Plasma cells werepolyclonal. SPEP was 0.3 gm/dL on 03/07/2020 and 0.2 gm/dL on 08/01/2020.  Check SPEP at next blood draw. 6. Left lower extremity DVT Duplex on 07/04/2019 revealedupper deep calf vein in the region of the venous trifurcation. IVC filterwas placed on 11/07/2019. He remains off anticoagulation secondary to hematuria.  Continue to monitor. 7.   Degarelix today. 8.   Reschedule CT and bone scan. 9.   RTC in 1 month for MD assessment, labs (CBC with diff, CMP, SPEP, FLCA, PSA), review of imaging, and degarelix.  I discussed the assessment and treatment plan with the patient.  The patient was provided an opportunity to ask questions and all were answered.  The patient agreed with the plan and demonstrated an understanding  of the instructions.  The patient was advised to call back if the symptoms worsen or if the condition fails to improve as anticipated.  I provided *** minutes of face-to-face time during this this encounter and > 50% was spent counseling as documented under my assessment and plan.  Lequita Asal, MD, PhD    01/29/2021, 1:57 PM  Carleene Cooper, am acting as Education administrator for [employer]. Mike Gip, MD, PhD.  I, Melissa C. Mike Gip, MD, have reviewed the above documentation for accuracy and  completeness, and I agree with the above.

## 2021-01-30 ENCOUNTER — Inpatient Hospital Stay: Payer: Medicare Other | Admitting: Hematology and Oncology

## 2021-01-30 ENCOUNTER — Telehealth: Payer: Self-pay | Admitting: Hematology and Oncology

## 2021-01-30 ENCOUNTER — Inpatient Hospital Stay: Payer: Medicare Other | Attending: Hematology and Oncology

## 2021-01-30 NOTE — Telephone Encounter (Signed)
Attempt made to contact appt about missed appointment today. No answer/mailbox full.

## 2021-02-23 IMAGING — DX DG ABD PORTABLE 1V
2 series · 2 of 2 positions shown · non-contrast
Comparison: CT stone study 11/05/2019

CLINICAL DATA: Constipation.

EXAM:
PORTABLE ABDOMEN - 1 VIEW

[abdomen supine (1 of 2)]
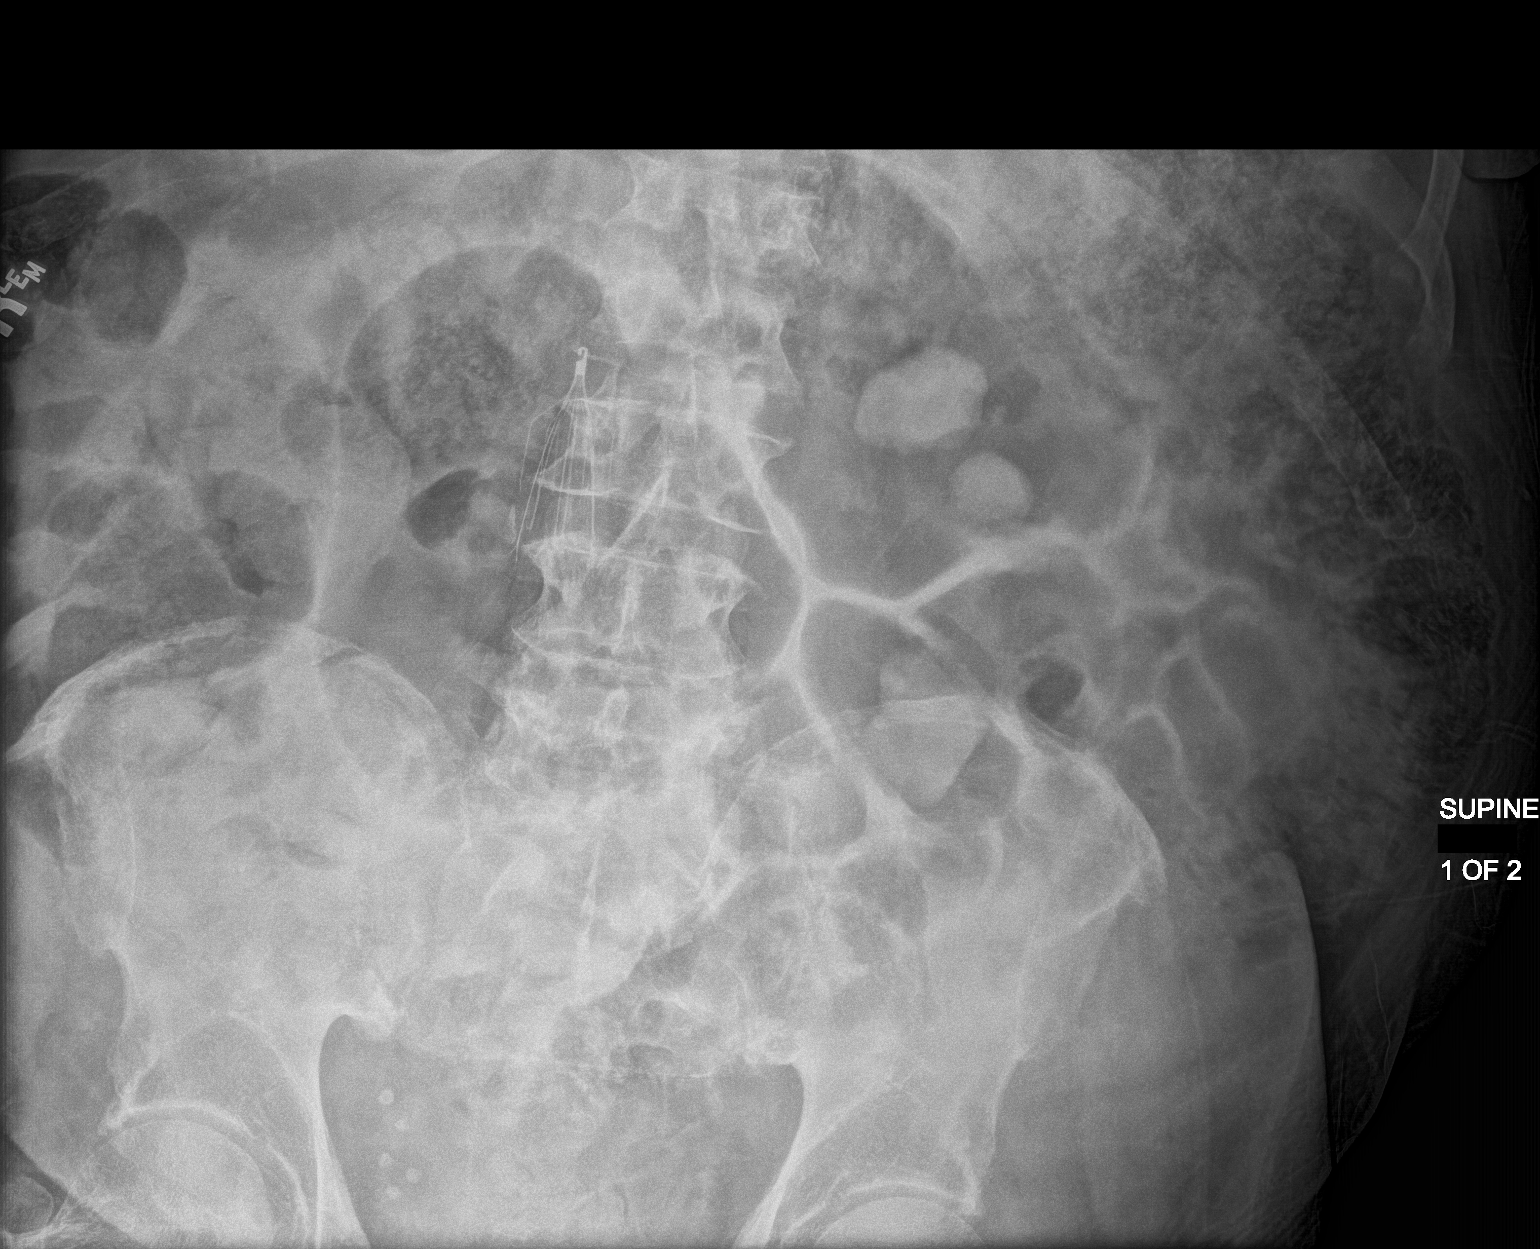

[abdomen supine (2 of 2)]
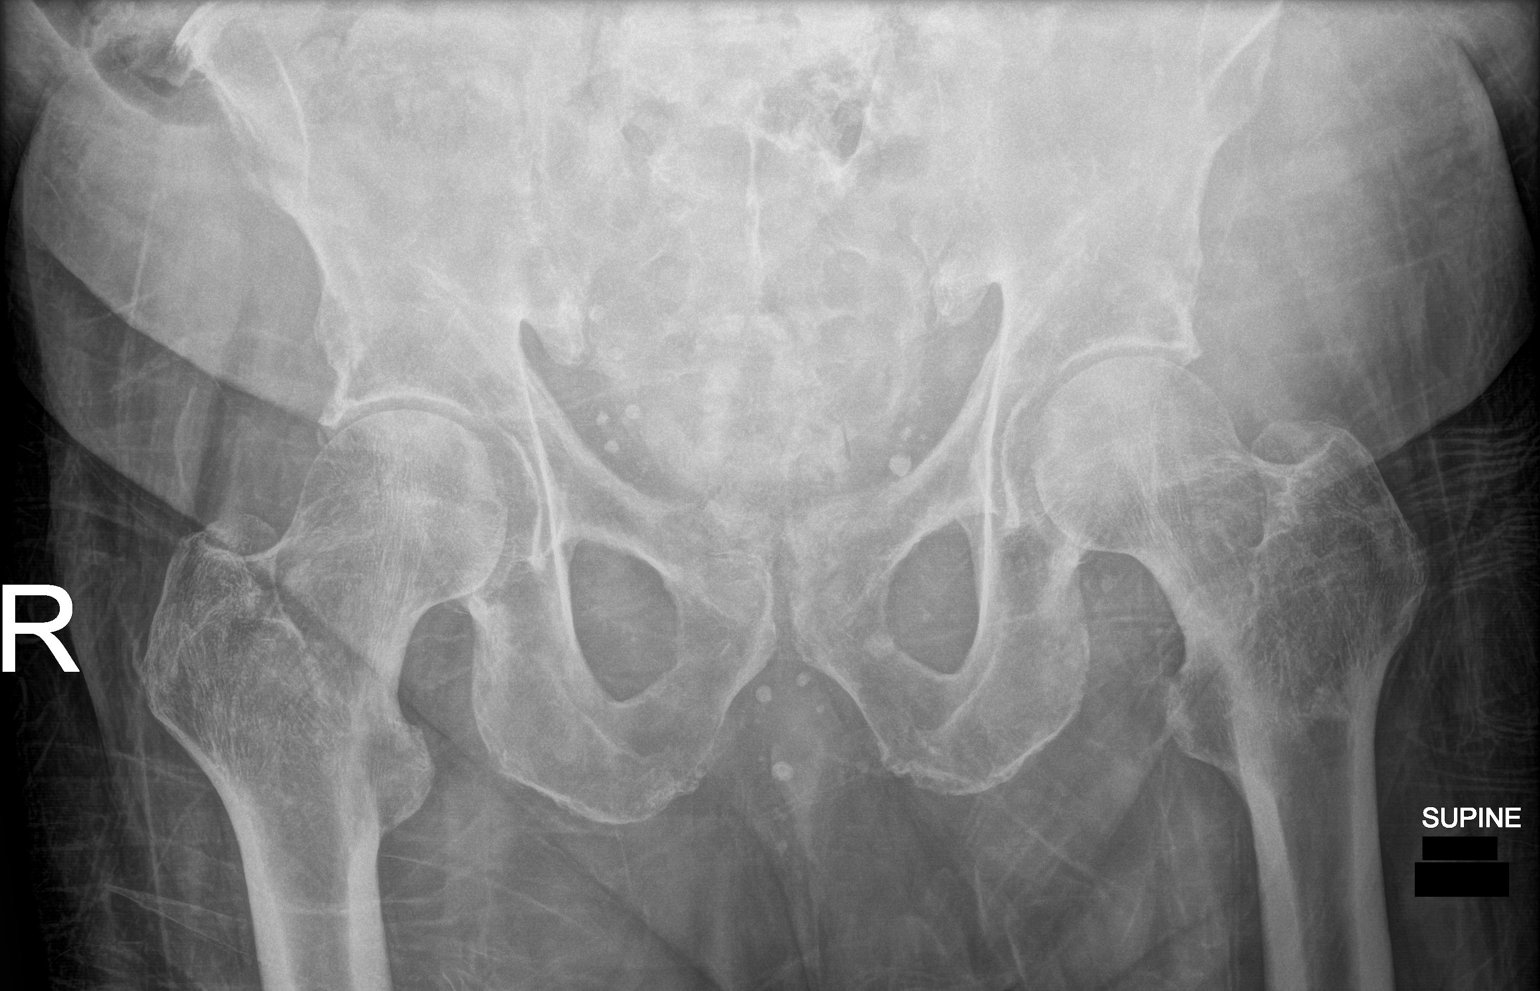

[2 of 2 positions shown; findings below may reference images not displayed]

FINDINGS: Mild diffuse gaseous small bowel distension noted. There is diffuse
gaseous distention of colon with large stool volume throughout. IVC
filter identified in situ. Phleboliths overlie the pelvis. Bones are
diffusely demineralized.
IMPRESSION: Mild small bowel distension with diffuse gas and stool distended
colon. Imaging features would be compatible with clinical
constipation.

## 2021-03-20 DEATH — deceased

## 2021-12-13 ENCOUNTER — Encounter: Payer: Self-pay | Admitting: Hematology and Oncology

## 2021-12-13 NOTE — Telephone Encounter (Signed)
Signing encounter see previous note on 11/28/20

## 2022-01-22 IMAGING — CR DG WRIST COMPLETE 3+V*R*
1 series · 4 of 4 positions shown · non-contrast
Comparison: None.

CLINICAL DATA: Fall with hand bruising.

EXAM:
RIGHT WRIST - COMPLETE 3+ VIEW

[Series 1: dg wrist complete right · 0.14mm/px · 4 of 4 slices shown]
[im 1/4]
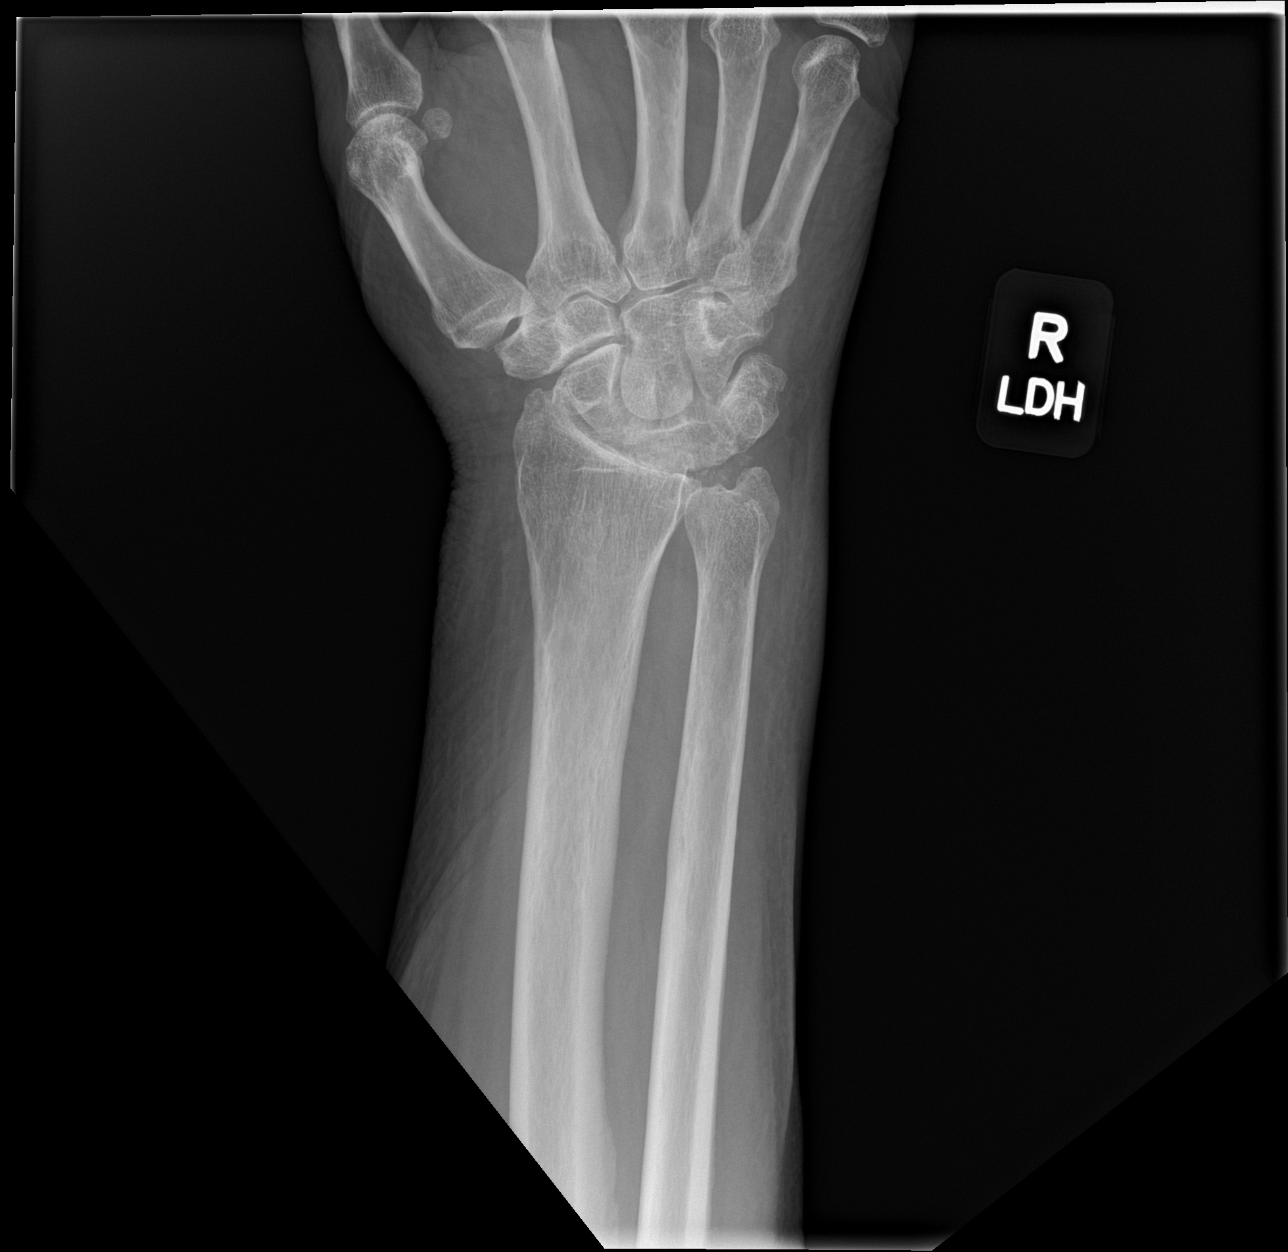
[im 2/4]
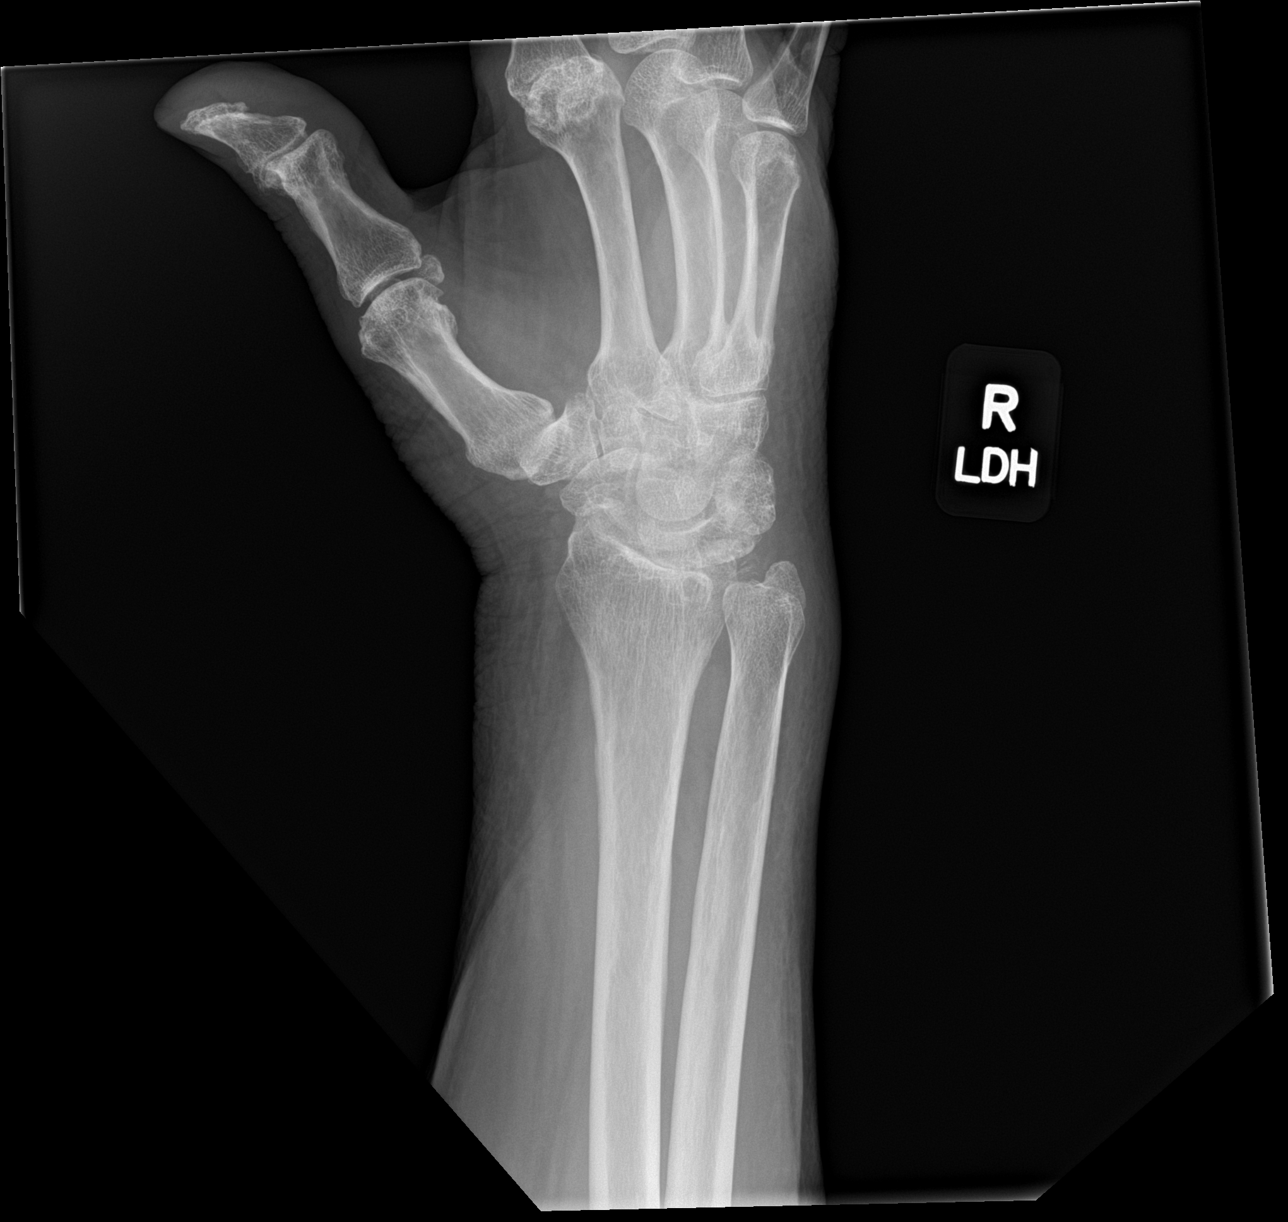
[im 3/4]
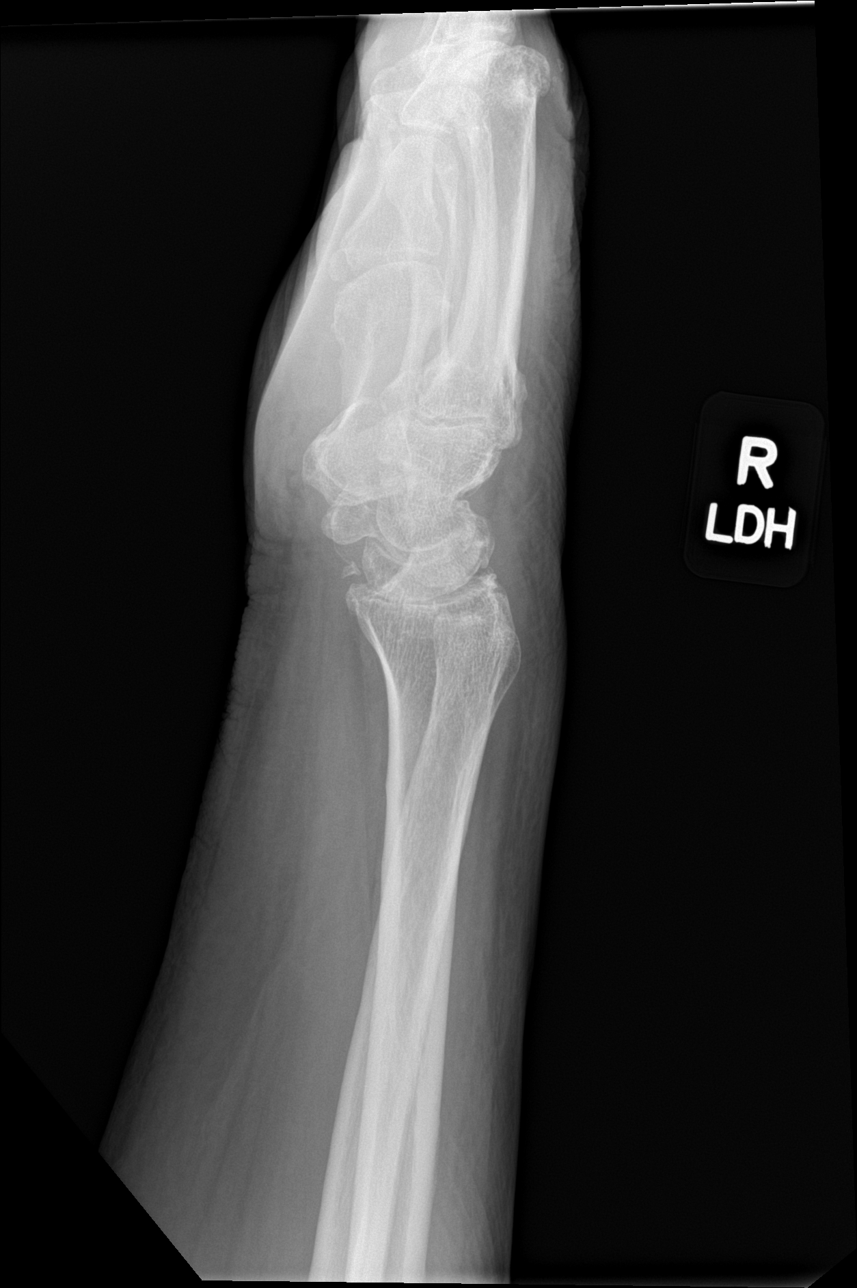
[im 4/4]
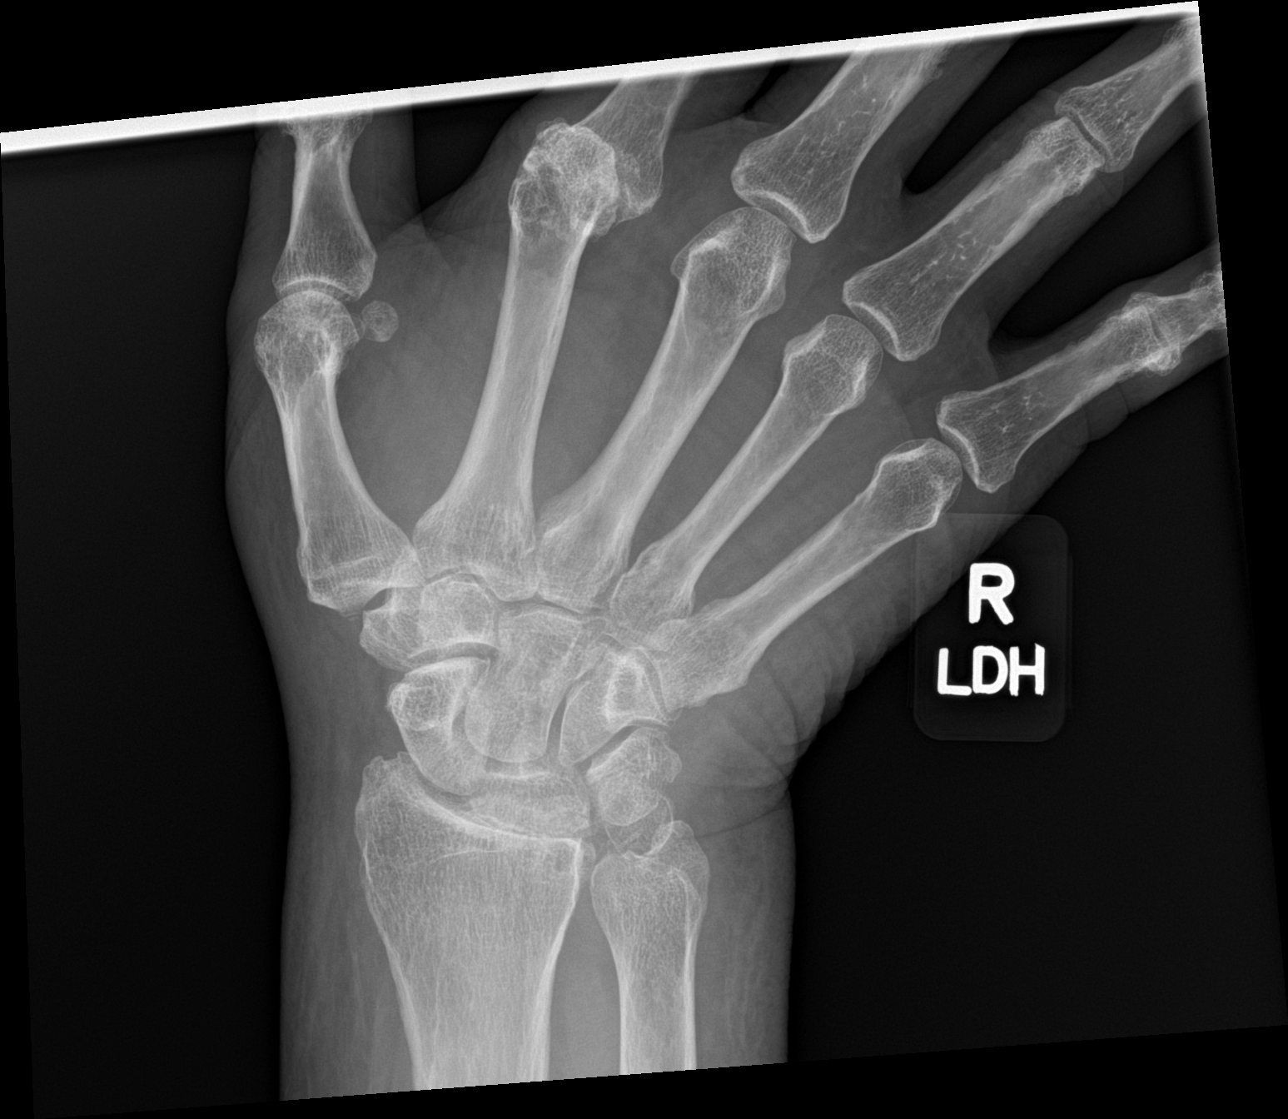

[4 of 4 positions shown; findings below may reference images not displayed]

FINDINGS: No acute fracture or subluxation.

Advanced wrist osteoarthritis with joint collapse and sclerosis.
Chondrocalcinosis at the triangular fibrocartilage.

Advanced second MCP osteoarthritis with subluxation.
IMPRESSION: 1. No acute finding.
2. Advanced wrist and second MCP osteoarthritis.

## 2022-01-22 IMAGING — CR DG HAND COMPLETE 3+V*R*
1 series · 3 of 3 positions shown · non-contrast
Comparison: None.

CLINICAL DATA: Fall with hand bruising

EXAM:
RIGHT HAND - COMPLETE 3+ VIEW

[Series 1: dg hand complete right · 0.14mm/px · 3 of 3 slices shown]
[im 1/3]
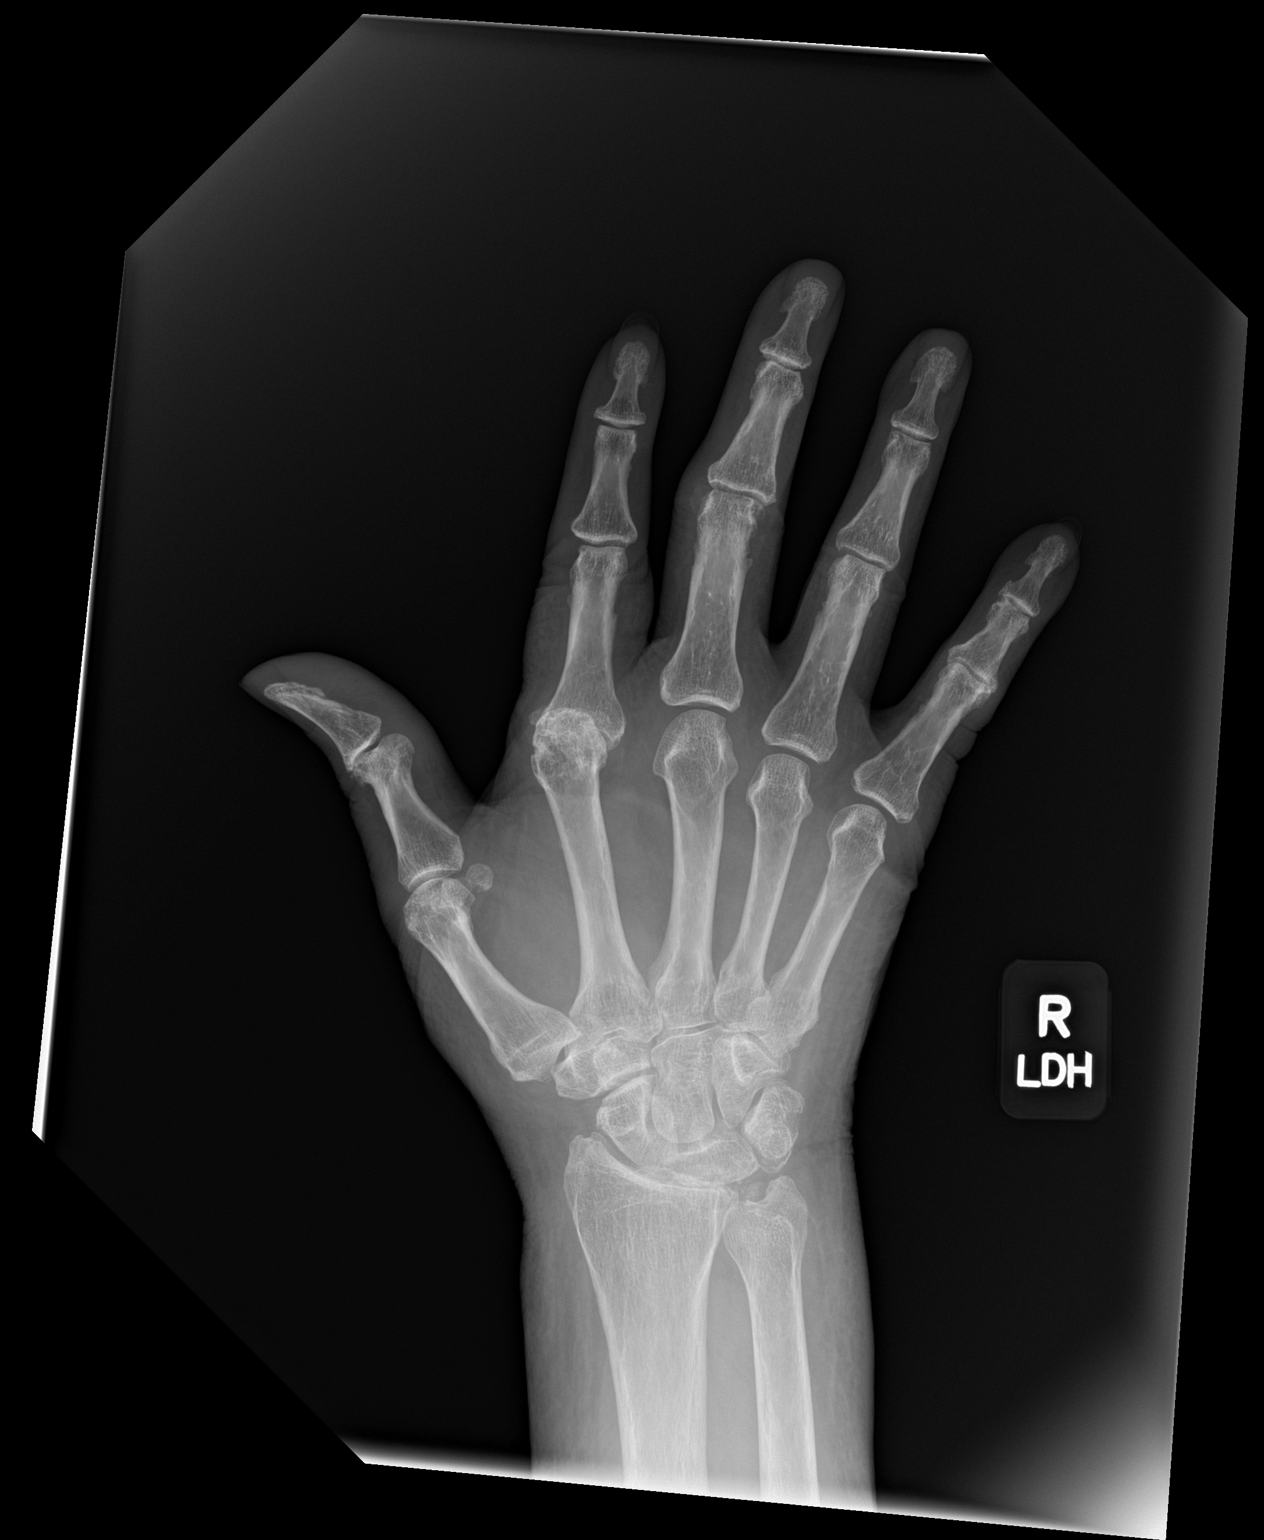
[im 2/3]
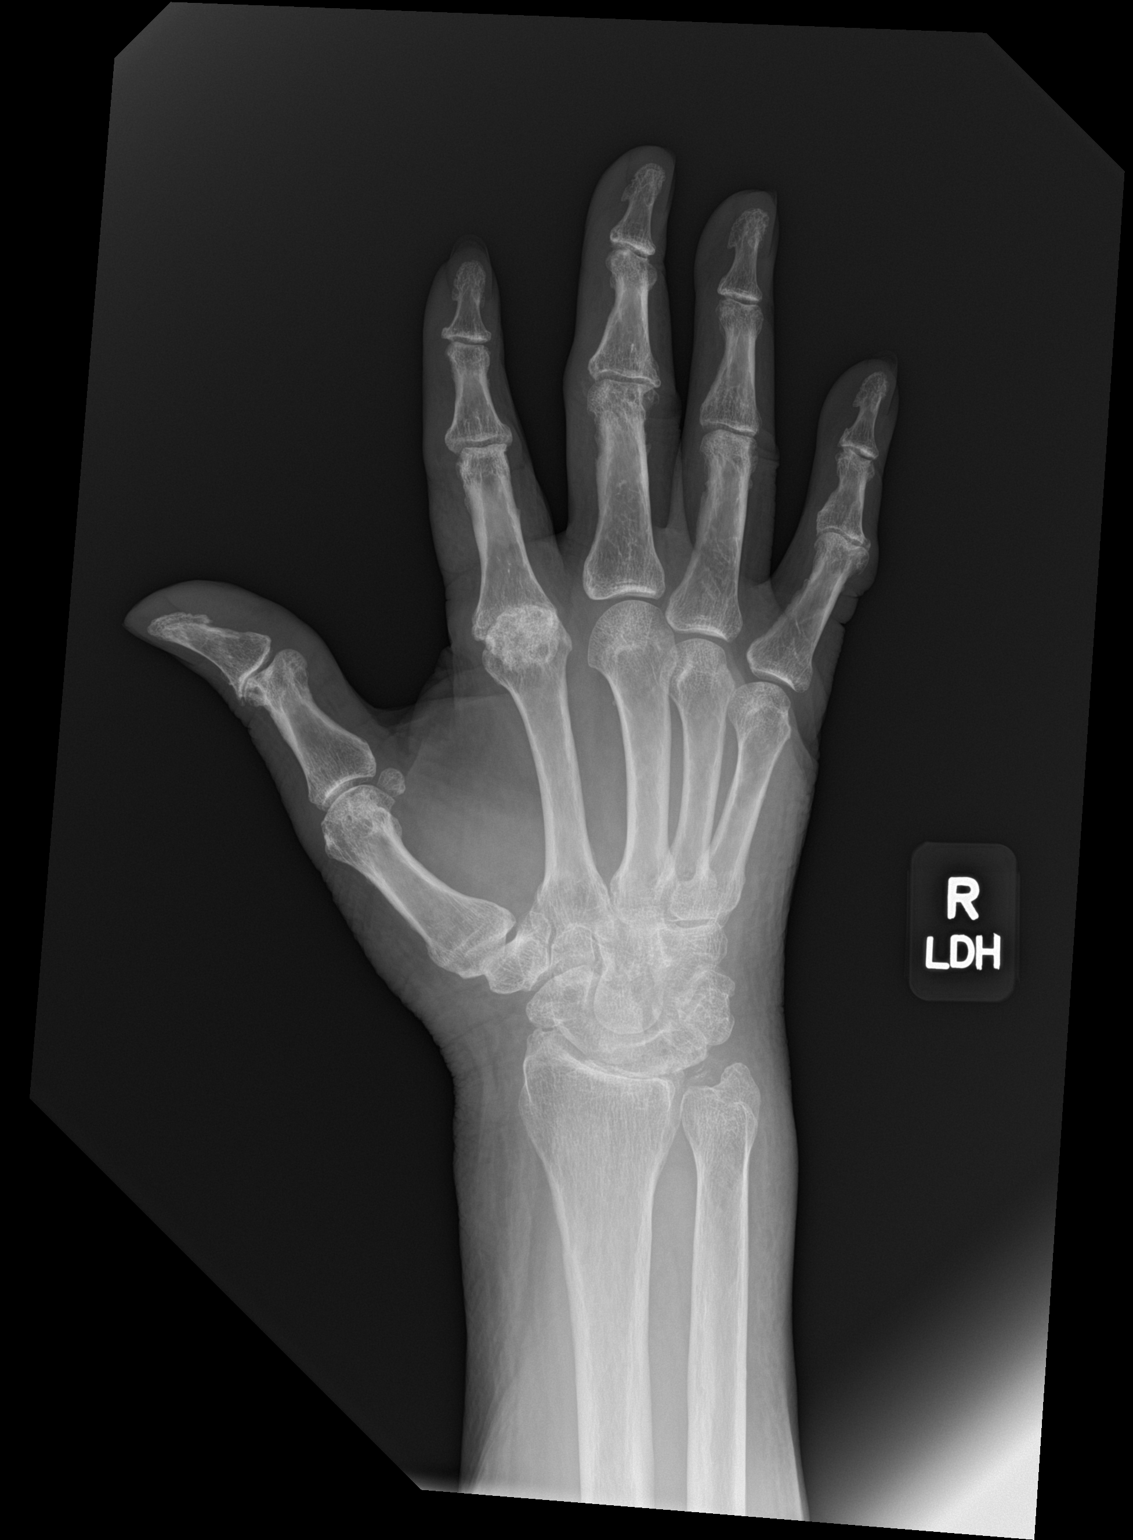
[im 3/3]
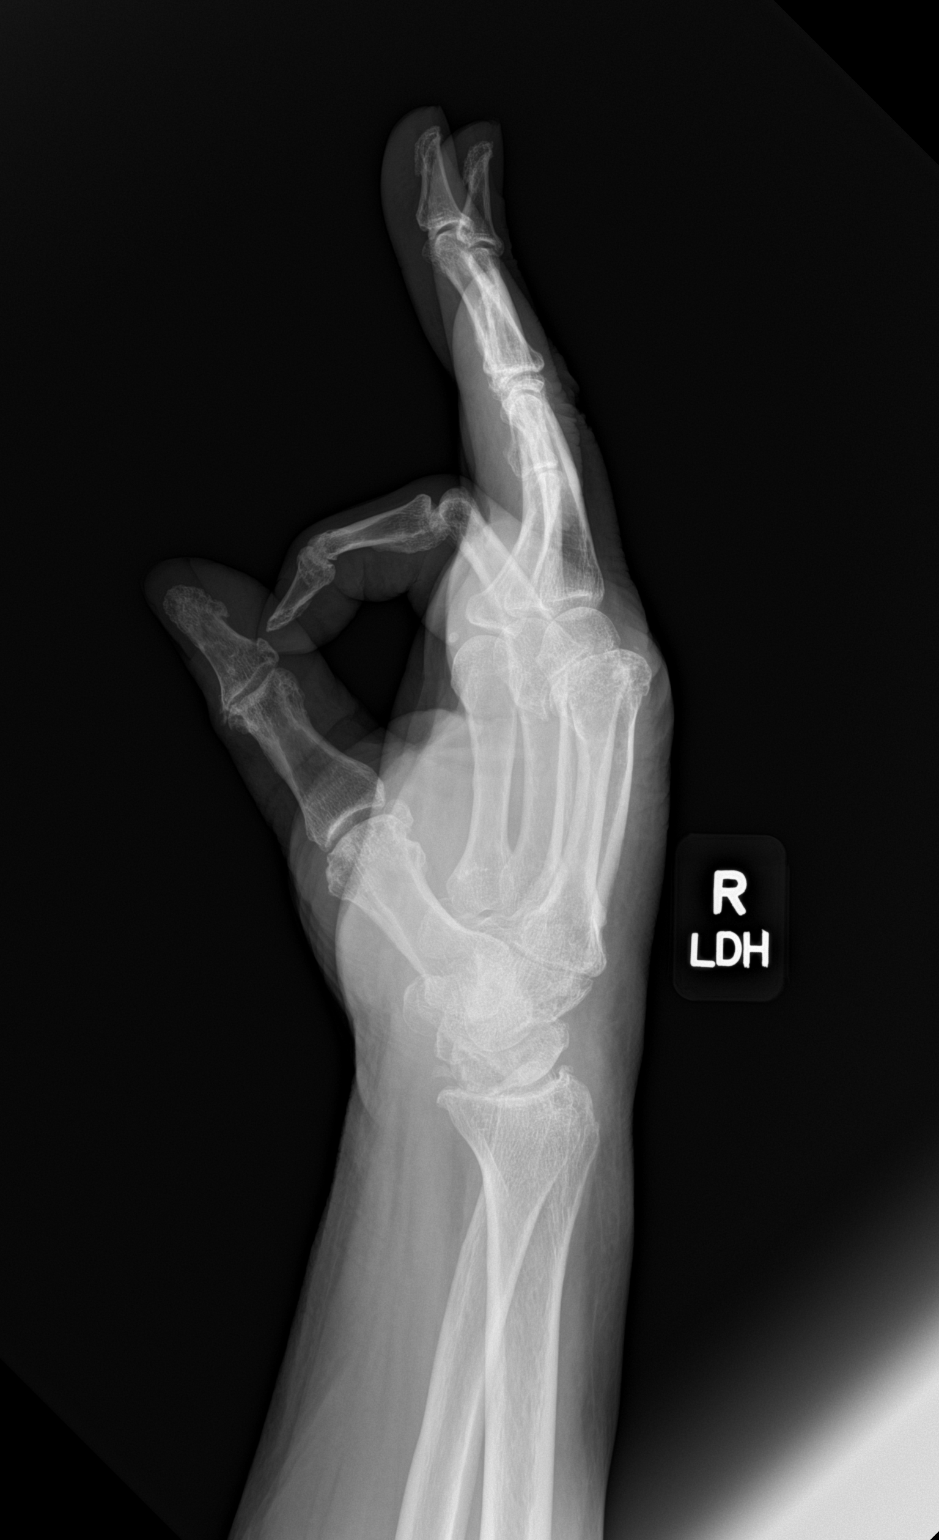

[3 of 3 positions shown; findings below may reference images not displayed]

FINDINGS: Advanced osteoarthritis at the wrist and second MCP joint, which is
anteriorly subluxed. More moderate osteoarthritis of the
interphalangeal joints. No evidence of fracture. No traumatic
malalignment.
IMPRESSION: 1. Soft tissue swelling without acute finding.
2. Advanced wrist and second MCP osteoarthritis.

## 2022-06-03 ENCOUNTER — Encounter: Payer: Self-pay | Admitting: Hematology and Oncology

## 2022-08-18 ENCOUNTER — Encounter (INDEPENDENT_AMBULATORY_CARE_PROVIDER_SITE_OTHER): Payer: Self-pay
